# Patient Record
Sex: Male | Born: 1940
Health system: Southern US, Community
[De-identification: ages and names within clinical notes are randomized; demographics above are authoritative.]

## PROBLEM LIST (undated history)

## (undated) DIAGNOSIS — R410 Disorientation, unspecified: Secondary | ICD-10-CM

## (undated) DIAGNOSIS — J45909 Unspecified asthma, uncomplicated: Secondary | ICD-10-CM

## (undated) DIAGNOSIS — J449 Chronic obstructive pulmonary disease, unspecified: Secondary | ICD-10-CM

## (undated) DIAGNOSIS — I2510 Atherosclerotic heart disease of native coronary artery without angina pectoris: Secondary | ICD-10-CM

## (undated) DIAGNOSIS — N400 Enlarged prostate without lower urinary tract symptoms: Secondary | ICD-10-CM

## (undated) DIAGNOSIS — E119 Type 2 diabetes mellitus without complications: Secondary | ICD-10-CM

## (undated) DIAGNOSIS — I499 Cardiac arrhythmia, unspecified: Secondary | ICD-10-CM

## (undated) DIAGNOSIS — G4730 Sleep apnea, unspecified: Secondary | ICD-10-CM

## (undated) DIAGNOSIS — Z79899 Other long term (current) drug therapy: Secondary | ICD-10-CM

## (undated) DIAGNOSIS — K2270 Barrett's esophagus without dysplasia: Secondary | ICD-10-CM

## (undated) DIAGNOSIS — L409 Psoriasis, unspecified: Secondary | ICD-10-CM

## (undated) DIAGNOSIS — E785 Hyperlipidemia, unspecified: Secondary | ICD-10-CM

## (undated) DIAGNOSIS — E039 Hypothyroidism, unspecified: Secondary | ICD-10-CM

## (undated) DIAGNOSIS — I10 Essential (primary) hypertension: Secondary | ICD-10-CM

## (undated) DIAGNOSIS — K219 Gastro-esophageal reflux disease without esophagitis: Secondary | ICD-10-CM

## (undated) DIAGNOSIS — K449 Diaphragmatic hernia without obstruction or gangrene: Secondary | ICD-10-CM

## (undated) DIAGNOSIS — I219 Acute myocardial infarction, unspecified: Secondary | ICD-10-CM

## (undated) HISTORY — DX: Gastro-esophageal reflux disease without esophagitis: K21.9

## (undated) HISTORY — PX: NASAL SINUS SURGERY: SHX719

## (undated) HISTORY — DX: Type 2 diabetes mellitus without complications: E11.9

## (undated) HISTORY — DX: Psoriasis, unspecified: L40.9

## (undated) HISTORY — DX: Atherosclerotic heart disease of native coronary artery without angina pectoris: I25.10

## (undated) HISTORY — DX: Hypothyroidism, unspecified: E03.9

## (undated) HISTORY — DX: Hyperlipidemia, unspecified: E78.5

## (undated) HISTORY — DX: Cardiac arrhythmia, unspecified: I49.9

## (undated) HISTORY — DX: Essential (primary) hypertension: I10

## (undated) HISTORY — DX: Unspecified asthma, uncomplicated: J45.909

## (undated) HISTORY — DX: Other long term (current) drug therapy: Z79.899

## (undated) HISTORY — DX: Chronic obstructive pulmonary disease, unspecified: J44.9

## (undated) HISTORY — DX: Diaphragmatic hernia without obstruction or gangrene: K44.9

## (undated) HISTORY — PX: OTHER SURGICAL HISTORY: SHX169

## (undated) HISTORY — DX: Acute myocardial infarction, unspecified: I21.9

## (undated) HISTORY — DX: Disorientation, unspecified: R41.0

## (undated) HISTORY — DX: Benign prostatic hyperplasia without lower urinary tract symptoms: N40.0

## (undated) HISTORY — PX: TONSILLECTOMY: SUR1361

---
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
## 1992-04-28 HISTORY — PX: OTHER SURGICAL HISTORY: SHX169

## 1997-07-27 ENCOUNTER — Encounter (HOSPITAL_COMMUNITY): Admission: RE | Admit: 1997-07-27 | Discharge: 1997-10-25 | Payer: Self-pay | Admitting: Cardiovascular Disease

## 1997-09-22 ENCOUNTER — Ambulatory Visit (HOSPITAL_COMMUNITY): Admission: RE | Admit: 1997-09-22 | Discharge: 1997-09-22 | Payer: Self-pay | Admitting: Cardiology

## 1997-10-06 ENCOUNTER — Ambulatory Visit (HOSPITAL_COMMUNITY): Admission: RE | Admit: 1997-10-06 | Discharge: 1997-10-06 | Payer: Self-pay | Admitting: Cardiology

## 1997-10-12 ENCOUNTER — Ambulatory Visit (HOSPITAL_COMMUNITY): Admission: RE | Admit: 1997-10-12 | Discharge: 1997-10-13 | Payer: Self-pay | Admitting: Cardiology

## 1997-10-27 ENCOUNTER — Encounter (HOSPITAL_COMMUNITY): Admission: RE | Admit: 1997-10-27 | Discharge: 1998-01-25 | Payer: Self-pay | Admitting: Cardiovascular Disease

## 1998-01-26 ENCOUNTER — Encounter (HOSPITAL_COMMUNITY): Admission: RE | Admit: 1998-01-26 | Discharge: 1998-04-26 | Payer: Self-pay | Admitting: Cardiovascular Disease

## 1998-04-27 ENCOUNTER — Encounter (HOSPITAL_COMMUNITY): Admission: RE | Admit: 1998-04-27 | Discharge: 1998-07-26 | Payer: Self-pay | Admitting: Cardiovascular Disease

## 1998-07-27 ENCOUNTER — Encounter (HOSPITAL_COMMUNITY): Admission: RE | Admit: 1998-07-27 | Discharge: 1998-10-25 | Payer: Self-pay | Admitting: Cardiovascular Disease

## 1998-10-26 ENCOUNTER — Encounter (HOSPITAL_COMMUNITY): Admission: RE | Admit: 1998-10-26 | Discharge: 1999-01-24 | Payer: Self-pay | Admitting: Cardiovascular Disease

## 1999-05-13 ENCOUNTER — Encounter: Payer: Self-pay | Admitting: Family Medicine

## 1999-05-13 ENCOUNTER — Encounter: Admission: RE | Admit: 1999-05-13 | Discharge: 1999-05-13 | Payer: Self-pay | Admitting: Family Medicine

## 2002-06-02 ENCOUNTER — Encounter (INDEPENDENT_AMBULATORY_CARE_PROVIDER_SITE_OTHER): Payer: Self-pay | Admitting: Specialist

## 2002-06-02 ENCOUNTER — Ambulatory Visit (HOSPITAL_COMMUNITY): Admission: RE | Admit: 2002-06-02 | Discharge: 2002-06-02 | Payer: Self-pay | Admitting: Gastroenterology

## 2005-02-07 ENCOUNTER — Encounter: Admission: RE | Admit: 2005-02-07 | Discharge: 2005-02-07 | Payer: Self-pay | Admitting: Emergency Medicine

## 2005-03-15 ENCOUNTER — Encounter: Admission: RE | Admit: 2005-03-15 | Discharge: 2005-03-15 | Payer: Self-pay | Admitting: Emergency Medicine

## 2005-04-01 ENCOUNTER — Encounter: Admission: RE | Admit: 2005-04-01 | Discharge: 2005-04-01 | Payer: Self-pay | Admitting: Emergency Medicine

## 2005-04-15 ENCOUNTER — Encounter: Admission: RE | Admit: 2005-04-15 | Discharge: 2005-04-15 | Payer: Self-pay | Admitting: Emergency Medicine

## 2005-05-02 ENCOUNTER — Encounter: Admission: RE | Admit: 2005-05-02 | Discharge: 2005-05-02 | Payer: Self-pay | Admitting: Emergency Medicine

## 2005-05-20 ENCOUNTER — Ambulatory Visit (HOSPITAL_COMMUNITY): Admission: RE | Admit: 2005-05-20 | Discharge: 2005-05-20 | Payer: Self-pay | Admitting: Emergency Medicine

## 2005-10-21 ENCOUNTER — Ambulatory Visit: Payer: Self-pay | Admitting: Internal Medicine

## 2005-11-14 ENCOUNTER — Encounter: Admission: RE | Admit: 2005-11-14 | Discharge: 2005-11-14 | Payer: Self-pay | Admitting: Cardiology

## 2005-11-26 ENCOUNTER — Ambulatory Visit: Payer: Self-pay | Admitting: Internal Medicine

## 2005-11-28 ENCOUNTER — Ambulatory Visit: Payer: Self-pay | Admitting: Internal Medicine

## 2005-12-02 ENCOUNTER — Ambulatory Visit: Payer: Self-pay | Admitting: Internal Medicine

## 2005-12-05 ENCOUNTER — Ambulatory Visit: Payer: Self-pay | Admitting: Internal Medicine

## 2005-12-22 ENCOUNTER — Ambulatory Visit: Payer: Self-pay | Admitting: Internal Medicine

## 2006-01-12 ENCOUNTER — Ambulatory Visit: Payer: Self-pay | Admitting: Internal Medicine

## 2006-01-22 ENCOUNTER — Ambulatory Visit: Payer: Self-pay | Admitting: Internal Medicine

## 2006-02-02 ENCOUNTER — Ambulatory Visit: Payer: Self-pay | Admitting: Internal Medicine

## 2006-05-11 ENCOUNTER — Ambulatory Visit: Payer: Self-pay | Admitting: Internal Medicine

## 2006-07-07 ENCOUNTER — Ambulatory Visit: Payer: Self-pay | Admitting: Internal Medicine

## 2006-07-14 ENCOUNTER — Ambulatory Visit: Payer: Self-pay | Admitting: Internal Medicine

## 2006-09-10 ENCOUNTER — Ambulatory Visit: Payer: Self-pay | Admitting: Internal Medicine

## 2006-10-02 ENCOUNTER — Encounter: Admission: RE | Admit: 2006-10-02 | Discharge: 2006-10-02 | Payer: Self-pay | Admitting: Emergency Medicine

## 2007-01-04 ENCOUNTER — Ambulatory Visit: Payer: Self-pay | Admitting: Internal Medicine

## 2007-01-07 ENCOUNTER — Ambulatory Visit: Payer: Self-pay | Admitting: Internal Medicine

## 2007-02-26 ENCOUNTER — Ambulatory Visit: Payer: Self-pay | Admitting: Internal Medicine

## 2007-03-08 ENCOUNTER — Ambulatory Visit: Payer: Self-pay | Admitting: Internal Medicine

## 2007-03-15 ENCOUNTER — Ambulatory Visit: Payer: Self-pay | Admitting: Internal Medicine

## 2007-03-22 ENCOUNTER — Ambulatory Visit: Payer: Self-pay | Admitting: Internal Medicine

## 2007-03-30 ENCOUNTER — Ambulatory Visit: Payer: Self-pay | Admitting: Internal Medicine

## 2007-04-06 ENCOUNTER — Ambulatory Visit: Payer: Self-pay | Admitting: Internal Medicine

## 2007-04-06 DIAGNOSIS — J45998 Other asthma: Secondary | ICD-10-CM | POA: Insufficient documentation

## 2007-04-06 DIAGNOSIS — J432 Centrilobular emphysema: Secondary | ICD-10-CM | POA: Insufficient documentation

## 2007-04-14 ENCOUNTER — Ambulatory Visit: Payer: Self-pay | Admitting: Internal Medicine

## 2007-04-26 ENCOUNTER — Ambulatory Visit: Payer: Self-pay | Admitting: Internal Medicine

## 2007-05-11 ENCOUNTER — Ambulatory Visit: Payer: Self-pay | Admitting: Internal Medicine

## 2007-05-19 ENCOUNTER — Ambulatory Visit: Payer: Self-pay | Admitting: Internal Medicine

## 2007-05-25 ENCOUNTER — Ambulatory Visit: Payer: Self-pay | Admitting: Internal Medicine

## 2007-06-03 ENCOUNTER — Ambulatory Visit: Payer: Self-pay | Admitting: Internal Medicine

## 2007-06-15 ENCOUNTER — Ambulatory Visit: Payer: Self-pay | Admitting: Internal Medicine

## 2007-06-22 ENCOUNTER — Ambulatory Visit: Payer: Self-pay | Admitting: Internal Medicine

## 2007-06-29 ENCOUNTER — Ambulatory Visit: Payer: Self-pay | Admitting: Internal Medicine

## 2007-06-30 ENCOUNTER — Ambulatory Visit: Payer: Self-pay | Admitting: Internal Medicine

## 2007-07-06 ENCOUNTER — Ambulatory Visit: Payer: Self-pay | Admitting: Internal Medicine

## 2007-07-13 ENCOUNTER — Ambulatory Visit: Payer: Self-pay | Admitting: Internal Medicine

## 2007-07-20 ENCOUNTER — Ambulatory Visit: Payer: Self-pay | Admitting: Internal Medicine

## 2007-07-27 ENCOUNTER — Ambulatory Visit (HOSPITAL_BASED_OUTPATIENT_CLINIC_OR_DEPARTMENT_OTHER): Admission: RE | Admit: 2007-07-27 | Discharge: 2007-07-27 | Payer: Self-pay | Admitting: Emergency Medicine

## 2007-07-27 ENCOUNTER — Ambulatory Visit: Payer: Self-pay | Admitting: Internal Medicine

## 2007-08-01 ENCOUNTER — Ambulatory Visit: Payer: Self-pay | Admitting: Internal Medicine

## 2007-08-03 ENCOUNTER — Ambulatory Visit: Payer: Self-pay | Admitting: Internal Medicine

## 2007-08-11 ENCOUNTER — Ambulatory Visit: Payer: Self-pay | Admitting: Internal Medicine

## 2007-08-17 ENCOUNTER — Ambulatory Visit: Payer: Self-pay | Admitting: Internal Medicine

## 2007-08-24 ENCOUNTER — Ambulatory Visit: Payer: Self-pay | Admitting: Internal Medicine

## 2007-08-31 ENCOUNTER — Ambulatory Visit: Payer: Self-pay | Admitting: Internal Medicine

## 2007-09-07 ENCOUNTER — Ambulatory Visit: Payer: Self-pay | Admitting: Internal Medicine

## 2007-09-23 ENCOUNTER — Ambulatory Visit: Payer: Self-pay | Admitting: Internal Medicine

## 2007-09-27 ENCOUNTER — Ambulatory Visit: Payer: Self-pay | Admitting: Internal Medicine

## 2007-10-05 ENCOUNTER — Ambulatory Visit: Payer: Self-pay | Admitting: Internal Medicine

## 2007-10-20 ENCOUNTER — Ambulatory Visit: Payer: Self-pay | Admitting: Internal Medicine

## 2007-10-25 ENCOUNTER — Ambulatory Visit: Payer: Self-pay | Admitting: Internal Medicine

## 2007-11-02 ENCOUNTER — Ambulatory Visit: Payer: Self-pay | Admitting: Internal Medicine

## 2007-11-09 ENCOUNTER — Ambulatory Visit: Payer: Self-pay | Admitting: Internal Medicine

## 2007-11-10 ENCOUNTER — Ambulatory Visit: Payer: Self-pay | Admitting: Internal Medicine

## 2007-11-16 ENCOUNTER — Ambulatory Visit: Payer: Self-pay | Admitting: Internal Medicine

## 2007-11-23 ENCOUNTER — Ambulatory Visit: Payer: Self-pay | Admitting: Internal Medicine

## 2007-12-14 ENCOUNTER — Ambulatory Visit: Payer: Self-pay | Admitting: Internal Medicine

## 2007-12-21 ENCOUNTER — Ambulatory Visit: Payer: Self-pay | Admitting: Internal Medicine

## 2007-12-28 ENCOUNTER — Ambulatory Visit: Payer: Self-pay | Admitting: Internal Medicine

## 2008-01-05 ENCOUNTER — Ambulatory Visit: Payer: Self-pay | Admitting: Internal Medicine

## 2008-01-11 ENCOUNTER — Ambulatory Visit: Payer: Self-pay | Admitting: Internal Medicine

## 2008-01-19 ENCOUNTER — Ambulatory Visit: Payer: Self-pay | Admitting: Internal Medicine

## 2008-01-24 ENCOUNTER — Encounter: Payer: Self-pay | Admitting: Internal Medicine

## 2008-01-25 ENCOUNTER — Ambulatory Visit: Payer: Self-pay | Admitting: Pulmonary Disease

## 2008-01-31 ENCOUNTER — Ambulatory Visit: Payer: Self-pay | Admitting: Internal Medicine

## 2008-02-08 ENCOUNTER — Ambulatory Visit: Payer: Self-pay | Admitting: Internal Medicine

## 2008-02-09 ENCOUNTER — Encounter: Payer: Self-pay | Admitting: Internal Medicine

## 2008-02-14 ENCOUNTER — Ambulatory Visit: Payer: Self-pay | Admitting: Internal Medicine

## 2008-02-21 ENCOUNTER — Ambulatory Visit: Payer: Self-pay | Admitting: Internal Medicine

## 2008-02-28 ENCOUNTER — Ambulatory Visit: Payer: Self-pay | Admitting: Internal Medicine

## 2008-03-06 ENCOUNTER — Ambulatory Visit: Payer: Self-pay | Admitting: Internal Medicine

## 2008-03-13 ENCOUNTER — Ambulatory Visit: Payer: Self-pay | Admitting: Internal Medicine

## 2008-03-20 ENCOUNTER — Ambulatory Visit: Payer: Self-pay | Admitting: Internal Medicine

## 2008-03-21 ENCOUNTER — Ambulatory Visit: Payer: Self-pay | Admitting: Internal Medicine

## 2008-04-03 ENCOUNTER — Ambulatory Visit: Payer: Self-pay | Admitting: Internal Medicine

## 2008-04-10 ENCOUNTER — Ambulatory Visit: Payer: Self-pay | Admitting: Internal Medicine

## 2008-04-18 ENCOUNTER — Ambulatory Visit: Payer: Self-pay | Admitting: Internal Medicine

## 2008-04-26 ENCOUNTER — Ambulatory Visit: Payer: Self-pay | Admitting: Internal Medicine

## 2008-05-01 ENCOUNTER — Ambulatory Visit: Payer: Self-pay | Admitting: Internal Medicine

## 2008-05-10 ENCOUNTER — Ambulatory Visit: Payer: Self-pay | Admitting: Internal Medicine

## 2008-05-15 ENCOUNTER — Ambulatory Visit: Payer: Self-pay | Admitting: Internal Medicine

## 2008-05-23 ENCOUNTER — Ambulatory Visit: Payer: Self-pay | Admitting: Internal Medicine

## 2008-05-30 ENCOUNTER — Ambulatory Visit: Payer: Self-pay | Admitting: Internal Medicine

## 2008-06-05 ENCOUNTER — Ambulatory Visit: Payer: Self-pay | Admitting: Internal Medicine

## 2008-06-14 ENCOUNTER — Ambulatory Visit: Payer: Self-pay | Admitting: Internal Medicine

## 2008-06-19 ENCOUNTER — Ambulatory Visit: Payer: Self-pay | Admitting: Internal Medicine

## 2008-06-27 ENCOUNTER — Ambulatory Visit: Payer: Self-pay | Admitting: Internal Medicine

## 2008-07-04 ENCOUNTER — Ambulatory Visit: Payer: Self-pay | Admitting: Internal Medicine

## 2008-07-05 ENCOUNTER — Encounter: Payer: Self-pay | Admitting: Cardiology

## 2008-07-06 ENCOUNTER — Encounter: Payer: Self-pay | Admitting: Cardiology

## 2008-07-10 ENCOUNTER — Ambulatory Visit: Payer: Self-pay | Admitting: Internal Medicine

## 2008-07-10 ENCOUNTER — Encounter: Payer: Self-pay | Admitting: Cardiology

## 2008-07-17 ENCOUNTER — Ambulatory Visit: Payer: Self-pay | Admitting: Internal Medicine

## 2008-07-19 ENCOUNTER — Ambulatory Visit: Payer: Self-pay | Admitting: Internal Medicine

## 2008-07-25 ENCOUNTER — Ambulatory Visit: Payer: Self-pay | Admitting: Internal Medicine

## 2008-08-01 ENCOUNTER — Ambulatory Visit: Payer: Self-pay | Admitting: Internal Medicine

## 2008-08-08 ENCOUNTER — Ambulatory Visit: Payer: Self-pay | Admitting: Internal Medicine

## 2008-08-16 ENCOUNTER — Ambulatory Visit: Payer: Self-pay | Admitting: Internal Medicine

## 2008-08-22 ENCOUNTER — Ambulatory Visit: Payer: Self-pay | Admitting: Internal Medicine

## 2008-08-28 ENCOUNTER — Ambulatory Visit: Payer: Self-pay | Admitting: Internal Medicine

## 2008-09-06 ENCOUNTER — Ambulatory Visit: Payer: Self-pay | Admitting: Internal Medicine

## 2008-09-11 ENCOUNTER — Ambulatory Visit: Payer: Self-pay | Admitting: Internal Medicine

## 2008-09-19 ENCOUNTER — Ambulatory Visit: Payer: Self-pay | Admitting: Internal Medicine

## 2008-09-26 ENCOUNTER — Ambulatory Visit: Payer: Self-pay | Admitting: Internal Medicine

## 2008-10-03 ENCOUNTER — Ambulatory Visit: Payer: Self-pay | Admitting: Internal Medicine

## 2008-10-10 ENCOUNTER — Ambulatory Visit: Payer: Self-pay | Admitting: Internal Medicine

## 2008-10-17 ENCOUNTER — Ambulatory Visit: Payer: Self-pay | Admitting: Internal Medicine

## 2008-10-23 ENCOUNTER — Ambulatory Visit: Payer: Self-pay | Admitting: Internal Medicine

## 2008-10-31 ENCOUNTER — Ambulatory Visit: Payer: Self-pay | Admitting: Internal Medicine

## 2008-11-07 ENCOUNTER — Ambulatory Visit: Payer: Self-pay | Admitting: Internal Medicine

## 2008-11-14 ENCOUNTER — Ambulatory Visit: Payer: Self-pay | Admitting: Internal Medicine

## 2008-11-21 ENCOUNTER — Ambulatory Visit: Payer: Self-pay | Admitting: Internal Medicine

## 2008-11-22 ENCOUNTER — Ambulatory Visit: Payer: Self-pay | Admitting: Internal Medicine

## 2008-11-28 ENCOUNTER — Ambulatory Visit: Payer: Self-pay | Admitting: Internal Medicine

## 2008-12-05 ENCOUNTER — Ambulatory Visit: Payer: Self-pay | Admitting: Internal Medicine

## 2008-12-12 ENCOUNTER — Ambulatory Visit: Payer: Self-pay | Admitting: Internal Medicine

## 2008-12-13 ENCOUNTER — Telehealth: Payer: Self-pay | Admitting: Internal Medicine

## 2008-12-19 ENCOUNTER — Ambulatory Visit: Payer: Self-pay | Admitting: Internal Medicine

## 2008-12-26 ENCOUNTER — Ambulatory Visit: Payer: Self-pay | Admitting: Internal Medicine

## 2009-01-02 ENCOUNTER — Ambulatory Visit: Payer: Self-pay | Admitting: Internal Medicine

## 2009-01-09 ENCOUNTER — Ambulatory Visit: Payer: Self-pay | Admitting: Internal Medicine

## 2009-01-16 ENCOUNTER — Ambulatory Visit: Payer: Self-pay | Admitting: Internal Medicine

## 2009-01-24 ENCOUNTER — Ambulatory Visit: Payer: Self-pay | Admitting: Internal Medicine

## 2009-01-30 ENCOUNTER — Ambulatory Visit: Payer: Self-pay | Admitting: Internal Medicine

## 2009-02-06 ENCOUNTER — Ambulatory Visit: Payer: Self-pay | Admitting: Internal Medicine

## 2009-02-07 ENCOUNTER — Encounter: Payer: Self-pay | Admitting: Cardiology

## 2009-02-14 ENCOUNTER — Ambulatory Visit: Payer: Self-pay | Admitting: Internal Medicine

## 2009-02-20 ENCOUNTER — Ambulatory Visit: Payer: Self-pay | Admitting: Internal Medicine

## 2009-02-21 ENCOUNTER — Telehealth (INDEPENDENT_AMBULATORY_CARE_PROVIDER_SITE_OTHER): Payer: Self-pay | Admitting: *Deleted

## 2009-02-27 ENCOUNTER — Ambulatory Visit: Payer: Self-pay | Admitting: Internal Medicine

## 2009-03-06 ENCOUNTER — Ambulatory Visit: Payer: Self-pay | Admitting: Internal Medicine

## 2009-03-06 DIAGNOSIS — E039 Hypothyroidism, unspecified: Secondary | ICD-10-CM | POA: Insufficient documentation

## 2009-03-07 ENCOUNTER — Ambulatory Visit: Payer: Self-pay | Admitting: Cardiology

## 2009-03-07 DIAGNOSIS — N400 Enlarged prostate without lower urinary tract symptoms: Secondary | ICD-10-CM | POA: Insufficient documentation

## 2009-03-07 DIAGNOSIS — Z9861 Coronary angioplasty status: Secondary | ICD-10-CM

## 2009-03-07 DIAGNOSIS — E785 Hyperlipidemia, unspecified: Secondary | ICD-10-CM | POA: Insufficient documentation

## 2009-03-07 DIAGNOSIS — I2510 Atherosclerotic heart disease of native coronary artery without angina pectoris: Secondary | ICD-10-CM | POA: Insufficient documentation

## 2009-03-13 ENCOUNTER — Ambulatory Visit: Payer: Self-pay | Admitting: Internal Medicine

## 2009-03-20 ENCOUNTER — Ambulatory Visit: Payer: Self-pay | Admitting: Internal Medicine

## 2009-03-26 ENCOUNTER — Ambulatory Visit: Payer: Self-pay | Admitting: Internal Medicine

## 2009-04-02 ENCOUNTER — Ambulatory Visit: Payer: Self-pay | Admitting: Internal Medicine

## 2009-04-03 ENCOUNTER — Ambulatory Visit: Payer: Self-pay | Admitting: Internal Medicine

## 2009-04-10 ENCOUNTER — Ambulatory Visit: Payer: Self-pay | Admitting: Internal Medicine

## 2009-04-17 ENCOUNTER — Ambulatory Visit: Payer: Self-pay | Admitting: Internal Medicine

## 2009-04-24 ENCOUNTER — Ambulatory Visit: Payer: Self-pay | Admitting: Internal Medicine

## 2009-05-01 ENCOUNTER — Ambulatory Visit: Payer: Self-pay | Admitting: Internal Medicine

## 2009-05-09 ENCOUNTER — Ambulatory Visit: Payer: Self-pay | Admitting: Internal Medicine

## 2009-05-15 ENCOUNTER — Ambulatory Visit: Payer: Self-pay | Admitting: Internal Medicine

## 2009-05-23 ENCOUNTER — Ambulatory Visit: Payer: Self-pay | Admitting: Internal Medicine

## 2009-05-29 ENCOUNTER — Telehealth (INDEPENDENT_AMBULATORY_CARE_PROVIDER_SITE_OTHER): Payer: Self-pay | Admitting: *Deleted

## 2009-05-29 ENCOUNTER — Ambulatory Visit: Payer: Self-pay | Admitting: Internal Medicine

## 2009-06-05 ENCOUNTER — Ambulatory Visit: Payer: Self-pay | Admitting: Internal Medicine

## 2009-06-08 ENCOUNTER — Telehealth (INDEPENDENT_AMBULATORY_CARE_PROVIDER_SITE_OTHER): Payer: Self-pay | Admitting: *Deleted

## 2009-06-12 ENCOUNTER — Ambulatory Visit: Payer: Self-pay | Admitting: Internal Medicine

## 2009-06-19 ENCOUNTER — Ambulatory Visit: Payer: Self-pay | Admitting: Internal Medicine

## 2009-06-27 ENCOUNTER — Ambulatory Visit: Payer: Self-pay | Admitting: Internal Medicine

## 2009-07-05 ENCOUNTER — Ambulatory Visit: Payer: Self-pay | Admitting: Internal Medicine

## 2009-07-10 ENCOUNTER — Ambulatory Visit: Payer: Self-pay | Admitting: Internal Medicine

## 2009-07-19 ENCOUNTER — Ambulatory Visit: Payer: Self-pay | Admitting: Internal Medicine

## 2009-07-24 ENCOUNTER — Ambulatory Visit: Payer: Self-pay | Admitting: Internal Medicine

## 2009-07-31 ENCOUNTER — Ambulatory Visit: Payer: Self-pay | Admitting: Internal Medicine

## 2009-08-01 ENCOUNTER — Ambulatory Visit: Payer: Self-pay | Admitting: Internal Medicine

## 2009-08-07 ENCOUNTER — Ambulatory Visit: Payer: Self-pay | Admitting: Internal Medicine

## 2009-08-14 ENCOUNTER — Ambulatory Visit: Payer: Self-pay | Admitting: Internal Medicine

## 2009-08-21 ENCOUNTER — Ambulatory Visit: Payer: Self-pay | Admitting: Internal Medicine

## 2009-08-27 ENCOUNTER — Ambulatory Visit: Payer: Self-pay | Admitting: Internal Medicine

## 2009-08-27 DIAGNOSIS — J309 Allergic rhinitis, unspecified: Secondary | ICD-10-CM | POA: Insufficient documentation

## 2009-09-04 ENCOUNTER — Ambulatory Visit: Payer: Self-pay | Admitting: Internal Medicine

## 2009-09-11 ENCOUNTER — Ambulatory Visit: Payer: Self-pay | Admitting: Internal Medicine

## 2009-09-18 ENCOUNTER — Ambulatory Visit: Payer: Self-pay | Admitting: Internal Medicine

## 2009-10-01 ENCOUNTER — Ambulatory Visit: Payer: Self-pay | Admitting: Internal Medicine

## 2009-10-09 ENCOUNTER — Ambulatory Visit: Payer: Self-pay | Admitting: Internal Medicine

## 2009-10-16 ENCOUNTER — Ambulatory Visit: Payer: Self-pay | Admitting: Internal Medicine

## 2009-10-23 ENCOUNTER — Ambulatory Visit: Payer: Self-pay | Admitting: Internal Medicine

## 2009-10-30 ENCOUNTER — Ambulatory Visit: Payer: Self-pay | Admitting: Internal Medicine

## 2009-11-06 ENCOUNTER — Ambulatory Visit: Payer: Self-pay | Admitting: Internal Medicine

## 2009-11-13 ENCOUNTER — Ambulatory Visit: Payer: Self-pay | Admitting: Internal Medicine

## 2009-11-20 ENCOUNTER — Ambulatory Visit: Payer: Self-pay | Admitting: Internal Medicine

## 2009-11-27 ENCOUNTER — Ambulatory Visit: Payer: Self-pay | Admitting: Internal Medicine

## 2009-12-04 ENCOUNTER — Ambulatory Visit: Payer: Self-pay | Admitting: Internal Medicine

## 2009-12-11 ENCOUNTER — Ambulatory Visit: Payer: Self-pay | Admitting: Internal Medicine

## 2009-12-12 ENCOUNTER — Ambulatory Visit: Payer: Self-pay | Admitting: Internal Medicine

## 2009-12-18 ENCOUNTER — Ambulatory Visit: Payer: Self-pay | Admitting: Internal Medicine

## 2009-12-25 ENCOUNTER — Ambulatory Visit: Payer: Self-pay | Admitting: Internal Medicine

## 2010-01-01 ENCOUNTER — Ambulatory Visit: Payer: Self-pay | Admitting: Internal Medicine

## 2010-01-08 ENCOUNTER — Ambulatory Visit: Payer: Self-pay | Admitting: Internal Medicine

## 2010-01-18 ENCOUNTER — Ambulatory Visit: Payer: Self-pay | Admitting: Internal Medicine

## 2010-01-23 ENCOUNTER — Ambulatory Visit: Payer: Self-pay | Admitting: Internal Medicine

## 2010-01-29 ENCOUNTER — Ambulatory Visit: Payer: Self-pay | Admitting: Internal Medicine

## 2010-02-05 ENCOUNTER — Ambulatory Visit: Payer: Self-pay | Admitting: Internal Medicine

## 2010-02-12 ENCOUNTER — Ambulatory Visit: Payer: Self-pay | Admitting: Internal Medicine

## 2010-02-19 ENCOUNTER — Ambulatory Visit: Payer: Self-pay | Admitting: Internal Medicine

## 2010-02-21 ENCOUNTER — Ambulatory Visit: Payer: Self-pay | Admitting: Internal Medicine

## 2010-02-26 ENCOUNTER — Ambulatory Visit: Payer: Self-pay | Admitting: Internal Medicine

## 2010-03-04 ENCOUNTER — Ambulatory Visit: Payer: Self-pay | Admitting: Cardiology

## 2010-03-04 DIAGNOSIS — R0989 Other specified symptoms and signs involving the circulatory and respiratory systems: Secondary | ICD-10-CM | POA: Insufficient documentation

## 2010-03-05 ENCOUNTER — Ambulatory Visit: Payer: Self-pay | Admitting: Internal Medicine

## 2010-03-12 ENCOUNTER — Ambulatory Visit: Payer: Self-pay | Admitting: Internal Medicine

## 2010-03-13 ENCOUNTER — Encounter: Admission: RE | Admit: 2010-03-13 | Discharge: 2010-03-13 | Payer: Self-pay | Admitting: Emergency Medicine

## 2010-03-14 ENCOUNTER — Ambulatory Visit: Payer: Self-pay | Admitting: Cardiology

## 2010-03-14 ENCOUNTER — Ambulatory Visit: Payer: Self-pay

## 2010-03-14 ENCOUNTER — Encounter (INDEPENDENT_AMBULATORY_CARE_PROVIDER_SITE_OTHER): Payer: Self-pay | Admitting: *Deleted

## 2010-03-19 ENCOUNTER — Ambulatory Visit: Payer: Self-pay | Admitting: Internal Medicine

## 2010-03-26 ENCOUNTER — Ambulatory Visit: Payer: Self-pay | Admitting: Internal Medicine

## 2010-04-02 ENCOUNTER — Ambulatory Visit: Payer: Self-pay | Admitting: Internal Medicine

## 2010-04-09 ENCOUNTER — Ambulatory Visit: Payer: Self-pay | Admitting: Internal Medicine

## 2010-04-16 ENCOUNTER — Ambulatory Visit: Payer: Self-pay | Admitting: Internal Medicine

## 2010-04-23 ENCOUNTER — Ambulatory Visit: Payer: Self-pay | Admitting: Internal Medicine

## 2010-04-26 ENCOUNTER — Ambulatory Visit: Payer: Self-pay | Admitting: Internal Medicine

## 2010-05-01 ENCOUNTER — Telehealth: Payer: Self-pay | Admitting: Cardiology

## 2010-05-02 ENCOUNTER — Encounter (INDEPENDENT_AMBULATORY_CARE_PROVIDER_SITE_OTHER): Payer: Self-pay | Admitting: *Deleted

## 2010-05-11 ENCOUNTER — Ambulatory Visit: Payer: Self-pay | Admitting: Internal Medicine

## 2010-05-16 ENCOUNTER — Ambulatory Visit: Payer: Self-pay | Admitting: Internal Medicine

## 2010-05-17 ENCOUNTER — Ambulatory Visit: Payer: Self-pay | Admitting: Internal Medicine

## 2010-05-26 ENCOUNTER — Ambulatory Visit: Payer: Self-pay | Admitting: Internal Medicine

## 2010-05-26 LAB — CONVERTED CEMR LAB
ALT: 33 units/L (ref 0–53)
AST: 28 units/L (ref 0–37)
BUN: 14 mg/dL (ref 6–23)
Calcium: 9 mg/dL (ref 8.4–10.5)
Cholesterol: 144 mg/dL (ref 0–200)
GFR calc non Af Amer: 90.72 mL/min (ref >60)
Glucose, Bld: 99 mg/dL (ref 70–99)
HDL: 41.5 mg/dL (ref >39.00)
Potassium: 4.1 meq/L (ref 3.5–5.1)
Sodium: 141 meq/L (ref 135–145)
Total Protein: 6.3 g/dL (ref 6.0–8.3)
VLDL: 24.2 mg/dL (ref 0.0–40.0)

## 2010-05-28 ENCOUNTER — Ambulatory Visit: Payer: Self-pay | Admitting: Internal Medicine

## 2010-05-30 NOTE — Miscellaneous (Signed)
Summary: Injection Record / [facility]    Injection Record / [facility]    Imported By: Lennie Odor 12/28/2009 10:48:32  _____________________________________________________________________  External Attachment:    Type:   Image     Comment:   External Document

## 2010-05-30 NOTE — Letter (Signed)
Summary: Clearance Letter  [facility], Main Office  [address]   [place], [place] [address]   Phone: [phone]  Fax: [phone]    May 02, 2010  Re:     Octavio Manns Address:   [address] [place], [place]  [address] DOB:     07-06-1940 MRN:     [id number]   Dear Dr Madelon Lips,      Mr Mangano is low risk cardiac wise for knee surgery. Please call with any questions or concerns.    Sincerely,  Deliah Goody, RN/Dr Olga Millers

## 2010-05-30 NOTE — Miscellaneous (Signed)
Summary: Orders Update  Clinical Lists Changes  Orders: Added new Test order of TLB-BMP (Basic Metabolic Panel-BMET) (80048-METABOL) - Signed 

## 2010-05-30 NOTE — Miscellaneous (Signed)
Summary: Injection Record/[facility]  Injection Record/[facility]   Imported By: Sherian Rein 09/18/2009 08:59:22  _____________________________________________________________________  External Attachment:    Type:   Image     Comment:   External Document

## 2010-05-30 NOTE — Progress Notes (Signed)
Summary: Records request from [facility]  Request for records received from [facility]. Request forwarded to [facility]. Dena Chavis  May 29, 2009 3:42 PM

## 2010-05-30 NOTE — Progress Notes (Signed)
Summary: from office notes in nov- surgery  Phone Note Call from Patient Call back at Home Phone [phone]   Caller: Patient- [phone] Reason for Call: Talk to Nurse Summary of Call: from office notes in nov can pt be clear for surgery - knee.  Initial call taken by: Lorne Skeens,  May 01, 2010 2:07 PM  Follow-up for Phone Call        spoke with pt, he is needing surgery for a knee replacement with dr Madelon Lips. pt is having no problems. will foward for dr Jens Som review Deliah Goody, RN  May 01, 2010 2:24 PM   Additional Follow-up for Phone Call Additional follow up Details #1::        OK for surgery Ferman Hamming, MD, [facility]  May 02, 2010 11:21 AM  pt aware, note will be faxed to dr caffrey Deliah Goody, RN  May 02, 2010 3:14 PM

## 2010-05-30 NOTE — Miscellaneous (Signed)
Summary: Injection record  Injection record   Imported By: Lester Climax 05/10/2010 12:19:15  _____________________________________________________________________  External Attachment:    Type:   Image     Comment:   External Document

## 2010-05-30 NOTE — Miscellaneous (Signed)
Summary: Injection Record/[facility]  Injection Record/ [facility]   Imported By: Lanelle Bal 08/29/2009 14:07:40  _____________________________________________________________________  External Attachment:    Type:   Image     Comment:   External Document

## 2010-05-30 NOTE — Assessment & Plan Note (Signed)
Summary: chest congestion worse/ mbw   Primary Provider/Referring Provider:  Leslee Home  CC:  Acute visit-Chest congestion Increased.  History of Present Illness: 02/28/08- One day before next allergy vac inj is due, he begins getting more congested- Clears a few hours after each injection. Very pleased. Denies cough, wheeze, dyspnea. 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 We discussed his echocardiogram..   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 Tried Spiriva from Dr Lorenz Coaster after office spirometry came back low.  Aug 27, 2009 Allergic asthma, COPD Followed by [facility] cards for CAD. He feels he has done very well this Spring with what he considers minor sneeze and occasional wheeze. Takes daily fexofenadine. He has a skin lesion on his forehead he asked me to look at- suggested he show it to Dr Lorenz Coaster.   February 21, 2010-  He notes nasal congestion, postnasal drip and a little wheeze.  He believes this all changed when he couldn't get Allegra-D, so we talked abut that. He says rescue inhalers dry up his mouth so he has trouble with his teeth. Did have flu shot. Up to date on pneumovax.    Asthma History    Initial Asthma Severity Rating:    Age range: 12+ years    Symptoms: 0-2 days/week    Nighttime Awakenings: 0-2/month    Interferes w/ normal activity: no limitations    SABA use (not for EIB): 0-2 days/week    Asthma Severity Assessment: Intermittent   Preventive Screening-Counseling & Management  Alcohol-Tobacco     Smoking Status: quit     Year Quit: 1987  Current Medications (verified): 1)  Synthroid 175 Mcg Tabs (Levothyroxine Sodium) .... Take 1 By Mouth Once Daily 2)  Lipitor 40 Mg Tabs (Atorvastatin Calcium) .... Take 1 By Mouth Once Daily 3)  Viagra 100 Mg  Tabs (Sildenafil Citrate) .... As Needed 4)  Adult Aspirin Ec Low Strength 81 Mg  Tbec (Aspirin) .Marland Kitchen.. 1  Tab Monday, Wendnesday, Fri 5)  Omeprazole 20 Mg Tbec (Omeprazole) .Marland Kitchen.. 1 Tab By Mouth Once Daily 6)  Finasteride 5 Mg Tabs (Finasteride) .... Take 1 By Mouth At Bedtime 7)  Nitrolingual 0.4 Mg/spray Soln (Nitroglycerin) .... Use As Directed As Needed 8)  Senior Multivitamin Plus  Tabs (Multiple Vitamins-Minerals) .... Take 1 Tablet By Mouth Once A Day 9)  Glucosamine-Chondroitin 500-400 Mg Caps (Glucosamine-Chondroitin) .... 3 Tablets By Mouth At Breakfast 10)  Aleve 220 Mg Tabs (Naproxen Sodium) .... Take 2 By Mouth Once Daily As Needed 11)  Allergy Vaccine 1:10 G.h. .... Weekly 12)  Vitamin D3 2000 Unit Caps (Cholecalciferol) .Marland Kitchen.. 1 Tab By Mouth Once Daily 13)  Betamethasone Dipropionate 0.05 % Oint (Betamethasone Dipropionate) .... As Needed 14)  Restasis 0.05 % Emul (Cyclosporine) .Marland Kitchen.. 1 Drop in Each Eye Two Times A Day 15)  Cranberry 250 Mg Caps (Cranberry) .... Once Daily 16)  Calcium + D + K 750-500-40 Mg-Unt-Mcg Tabs (Calcium-Vitamin D-Vitamin K) .... Take 1 By Mouth Once Daily 17)  Garlic Oil 1000 Mg Caps (Garlic) .... Take 1 By Mouth Once Daily  Allergies (verified): No Known Drug Allergies  Review of Systems      See HPI       The patient complains of nasal congestion/difficulty breathing through nose.  The patient denies shortness of breath with activity, shortness of breath at rest, productive cough, non-productive cough, coughing up blood, chest pain, irregular  heartbeats, acid heartburn, indigestion, loss of appetite, weight change, abdominal pain, difficulty swallowing, sore throat, tooth/dental problems, headaches, and sneezing.    Vital Signs:  Patient profile:   70 year old male Height:      71 inches Weight:      262.13 pounds BMI:     36.69 O2 Sat:      95 % on Room air Pulse rate:   81 / minute BP sitting:   116 / 80  (left arm) Cuff size:   regular  Vitals Entered By: Reynaldo Minium CMA (February 21, 2010 11:32 AM)  O2 Flow:  Room air CC: Acute visit-Chest  congestion Increased   Physical Exam  Additional Exam:  General: A/Ox3; pleasant and cooperative, NAD, overweight, calm and talkative. SKIN: no rash,  small raised excoriated lesion left temple NODES: no lymphadenopathy HEENT: [place]/AT, EOM- WNL, Conjuctivae- clear, PERRLA, TM-WNL, Nose- clear, Throat- clear and wnl,, Mallampati  III NECK: Supple w/ fair ROM, JVD- none, normal carotid impulses w/o bruits Thyroid- CHEST: Clear to P&A HEART: RRR, no m/g/r heard ABDOMEN AOZ:HYQM, nl pulses, no edema  NEURO: Grossly intact to observation      Impression & Recommendations:  Problem # 1:  ALLERGIC ASTHMA (ICD-493.00) Good control. He is pleased. Rarely needs his rescue inhaler and we discussed his impression that these products affect his teeth.   Problem # 2:  ALLERGIC RHINITIS (ICD-477.9)  He believes his allergy trreatments have reduced his incidence of bronchitic flares. Rhinitis control has been good. We reviewed goals of allergy vaccine therapy.   Medications Added to Medication List This Visit: 1)  Synthroid 175 Mcg Tabs (Levothyroxine sodium) .... Take 1 by mouth once daily 2)  Restasis 0.05 % Emul (Cyclosporine) .Marland Kitchen.. 1 drop in each eye two times a day 3)  Cranberry 250 Mg Caps (Cranberry) .... Once daily 4)  Calcium + D + K 750-500-40 Mg-unt-mcg Tabs (Calcium-vitamin d-vitamin k) .... Take 1 by mouth once daily 5)  Garlic Oil 1000 Mg Caps (Garlic) .... Take 1 by mouth once daily  Other Orders: Est. Patient Level III (57846)  Patient Instructions: 1)  Please schedule a follow-up appointment in 1 year. 2)  Ask at pharmacy counter for either  3)  Allegra-D24 4)  Allegra- D12hr 5)  or, otc - try plain Allegtra/ fexofenadine 6)               plus a decongestant like Sudafed (sign for at counter)  7)                     or phenylephrine (Sudafed-PE and others)

## 2010-05-30 NOTE — Assessment & Plan Note (Signed)
Summary: 1 year/ mbw   Primary Provider/Referring Provider:  Leslee Home  CC:  Yearly follow up visit.  History of Present Illness: IMPRESSION:  Recurrent bronchitis with a significant atopic component. Pulmonary function tests in 2007 had been normal.  PLAN:  Continue allergy vaccine.  Okay to try taking one-half of a 180- mg fexofenadine p.r.n.  Scheduled to return in 1 year, but earlier p.r.n  02/28/08- One day before next allergy vac inj is due, he begins getting more congested- Clears a few hours after each injection. Very pleased. Denies cough, wheeze, dyspnea. 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 We discussed his echocardiogram..   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 Tried Spiriva from Dr Lorenz Coaster after office spirometry came back low.  Aug 27, 2009 Allergic asthma, COPD Followed by [facility] cards for CAD. He feels he has done very well this Spring with what he considers minor sneeze and occasional wheeze. Takes daily fexofenadine. He has a skin lesion on his forehead he asked me to look at- suggested he show it to Dr Lorenz Coaster.    Current Medications (verified): 1)  Synthroid 150 Mcg  Tabs (Levothyroxine Sodium) .... Take 1 Tablet By Mouth Once A Day 2)  Lipitor 40 Mg Tabs (Atorvastatin Calcium) .... Take 1 By Mouth Once Daily 3)  Viagra 100 Mg  Tabs (Sildenafil Citrate) .... As Needed 4)  Adult Aspirin Ec Low Strength 81 Mg  Tbec (Aspirin) .Marland Kitchen.. 1 Tab Monday, Wendnesday, Fri 5)  Omeprazole 20 Mg Tbec (Omeprazole) .Marland Kitchen.. 1 Tab By Mouth Once Daily 6)  Finasteride 5 Mg Tabs (Finasteride) .... Take 1 By Mouth At Bedtime 7)  Nitrolingual 0.4 Mg/spray Soln (Nitroglycerin) .... Use As Directed As Needed 8)  Senior Multivitamin Plus  Tabs (Multiple Vitamins-Minerals) .... Take 1 Tablet By Mouth Once A Day 9)  Glucosamine-Chondroitin 500-400 Mg Caps (Glucosamine-Chondroitin) .... 3  Tablets By Mouth At Breakfast 10)  Aleve 220 Mg Tabs (Naproxen Sodium) .... Take 2 By Mouth Once Daily As Needed 11)  Allergy Vaccine 1:10 G.h. .... Weekly 12)  Vitamin D3 2000 Unit Caps (Cholecalciferol) .Marland Kitchen.. 1 Tab By Mouth Once Daily 13)  Betamethasone Dipropionate 0.05 % Oint (Betamethasone Dipropionate) .... 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    Vital Signs:  Patient profile:   70 year old male Height:      71 inches Weight:      253.13 pounds BMI:     35.43 O2 Sat:      96 % on Room air Pulse rate:  83 / minute BP sitting:   120 / 78  (left arm) Cuff size:   regular  Vitals Entered By: Reynaldo Minium CMA (Aug 27, 2009 9:10 AM)  O2 Flow:  Room air  Physical Exam  Additional Exam:  General: A/Ox3; pleasant and cooperative, NAD, overweight, calm and talkative. SKIN: no rash,  small raised excoriated lesion left temple NODES: no lymphadenopathy HEENT: [place]/AT, EOM- WNL,  Conjuctivae- clear, PERRLA, TM-WNL, Nose- clear, Throat- clear and wnl,, Mallampati  III NECK: Supple w/ fair ROM, JVD- none, normal carotid impulses w/o bruits Thyroid- CHEST: Clear to P&A HEART: RRR, no m/g/r heard ABDOMEN XBJ:YNWG, nl pulses, no edema  NEURO: Grossly intact to observation      Impression & Recommendations:  Problem # 1:  ALLERGIC ASTHMA (ICD-493.00) Good control with no changes needed. He feels allergy vaccine has made a real difference.  Problem # 2:  ALLERGIC RHINITIS (ICD-477.9)  He is well pleased with the success of his allergy vaccine and the benign support of fexofenadine which we discussed.  Other Orders: Est. Patient Level III (95621)  Patient Instructions: 1)  Please schedule a follow-up appointment in 1 year. 2)  Call for refills and as needed

## 2010-05-30 NOTE — Letter (Signed)
Summary: Custom - Lipid  [facility], Main Office  [address]   [place], [place] [address]   Phone: [phone]  Fax: [phone]     March 14, 2010 MRN: [id number]   [facility] [address] [place], [place]  [address]   Dear Mr. Gahm,  We have reviewed your cholesterol results.  They are as follows:     Total Cholesterol:    144 (Desirable: less than 200)       HDL  Cholesterol:     41.50  (Desirable: greater than 40 for men and 50 for women)       LDL Cholesterol:       78  (Desirable: less than 100 for low risk and less than 70 for moderate to high risk)       Triglycerides:       121.0  (Desirable: less than 150)  Our recommendations include:These numbers look good. Continue on the same medicine. Liver function is normal. Take care, Dr. Darel Hong.    Call our office at the number listed above if you have any questions.  Lowering your LDL cholesterol is important, but it is only one of a large number of "risk factors" that may indicate that you are at risk for heart disease, stroke or other complications of hardening of the arteries.  Other risk factors include:   A.  Cigarette Smoking* B.  High Blood Pressure* C.  Obesity* D.   Low HDL Cholesterol (see yours above)* E.   Diabetes Mellitus (higher risk if your is uncontrolled) F.  Family history of premature heart disease G.  Previous history of stroke or cardiovascular disease    *These are risk factors YOU HAVE CONTROL OVER.  For more information, visit .  There is now evidence that lowering the TOTAL CHOLESTEROL AND LDL CHOLESTEROL can reduce the risk of heart disease.  The American Heart Association recommends the following guidelines for the treatment of elevated cholesterol:  1.  If there is now current heart disease and less than two risk factors, TOTAL CHOLESTEROL should be less than 200 and LDL CHOLESTEROL should be less than 100. 2.  If there is current heart disease or  two or more risk factors, TOTAL CHOLESTEROL should be less than 200 and LDL CHOLESTEROL should be less than 70.  A diet low in cholesterol, saturated fat, and calories is the cornerstone of treatment for elevated cholesterol.  Cessation of smoking and exercise are also important in the management of elevated cholesterol and preventing vascular disease.  Studies have shown that 30 to 60 minutes of physical activity most days can help lower blood pressure, lower cholesterol, and keep your weight at a healthy level.  Drug therapy is used when cholesterol levels do not respond to therapeutic lifestyle changes (smoking cessation, diet, and exercise) and remains unacceptably high.  If medication is started, it is important to have you levels checked periodically to evaluate the need for further treatment options.  Thank you,    [facility] Team

## 2010-05-30 NOTE — Assessment & Plan Note (Signed)
Summary: [name]/DM   Primary Provider:  Leslee Home   History of Present Illness: Pleasant gentleman with history of coronary disease for F/U. Patient has had previous PCI of his LAD. His last Myoview was performed in October of 2009 and showed an ejection fraction of 44%. There was a fixed anterior defect consistent with scar but no ischemia. He also had an echocardiogram at that time that showed an ejection fraction of 50-55% and trace aortic insufficiency. I last saw him in Nov 2010. Since then he does have some dyspnea from allergies. However there is no orthopnea, PND, palpitations, syncope or chest pain. There is mild pedal edema.  Current Medications (verified): 1)  Synthroid 175 Mcg Tabs (Levothyroxine Sodium) .... Take 1 By Mouth Once Daily 2)  Lipitor 40 Mg Tabs (Atorvastatin Calcium) .... Take 1 By Mouth Once Daily 3)  Viagra 100 Mg  Tabs (Sildenafil Citrate) .... As Needed 4)  Adult Aspirin Ec Low Strength 81 Mg  Tbec (Aspirin) .Marland Kitchen.. 1 Tab Monday, Wendnesday, Fri 5)  Omeprazole 20 Mg Tbec (Omeprazole) .Marland Kitchen.. 1 Tab By Mouth Once Daily 6)  Finasteride 5 Mg Tabs (Finasteride) .... Take 1 By Mouth At Bedtime 7)  Nitrolingual 0.4 Mg/spray Soln (Nitroglycerin) .... Use As Directed As Needed 8)  Senior Multivitamin Plus  Tabs (Multiple Vitamins-Minerals) .... Take 1 Tablet By Mouth Once A Day 9)  Glucosamine-Chondroitin 500-400 Mg Caps (Glucosamine-Chondroitin) .... 3 Tablets By Mouth At Breakfast 10)  Aleve 220 Mg Tabs (Naproxen Sodium) .... Take 2 By Mouth Once Daily As Needed 11)  Allergy Vaccine 1:10 G.h. .... Weekly 12)  Vitamin D3 2000 Unit Caps (Cholecalciferol) .Marland Kitchen.. 1 Tab By Mouth Once Daily 13)  Betamethasone Dipropionate 0.05 % Oint (Betamethasone Dipropionate) .... As Needed 14)  Restasis 0.05 % Emul (Cyclosporine) .Marland Kitchen.. 1 Drop in Each Eye Two Times A Day 15)  Fexofenadine Hcl 180 Mg Tabs (Fexofenadine Hcl) .Marland Kitchen.. 1 Tab By Mouth Once Daily 16)  Nasonex 50 Mcg/act Susp (Mometasone  Furoate) .... As Directed  Allergies: No Known Drug Allergies  Past History:  Past Medical History: Hyperlipidemia Hiatal hernia GERD HYPOTHYROIDISM (ICD-244.9) ALLERGIC ASTHMA (ICD-493.00) COPD (ICD-496) coronary artery disease (1987 - [place] with PCI of LAD; 1997 - [place] with repeat PCI of LAD) BPH psoriasis  Past Surgical History: Reviewed history from 03/07/2009 and no changes required. Left knee arthroscopy tonsillectomy sinus surgery  Social History: Reviewed history from 03/07/2009 and no changes required. 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 Remaining systems are negative.   Vital Signs:  Patient profile:   70 year old male Height:      71 inches Weight:      262 pounds BMI:     36.67 Pulse rate:   96 / minute Resp:     12 per minute BP sitting:   129 / 81  (left arm)  Vitals Entered By: Kem Parkinson (March 04, 2010 9:21 AM)  Physical Exam  General:  Well-developed well-nourished in no acute distress.  Skin is warm and dry.  HEENT is normal.  Neck is supple. No thyromegaly.  Chest is clear to auscultation with normal expansion.  Cardiovascular exam is regular rate and rhythm.  Abdominal exam nontender or distended. No masses palpated. positive bruit Extremities show 1+ edema. neuro grossly  intact    EKG  Procedure date:  03/04/2010  Findings:      Sinus at a rate of 96. Axis normal. Incomplete right bundle branch block. Prior septal infarct.  Impression & Recommendations:  Problem # 1:  PURE HYPERCHOLESTEROLEMIA (ICD-272.0) Continue statin. Check lipids and liver. His updated medication list for this problem includes:    Lipitor 40 Mg Tabs (Atorvastatin calcium) .Marland Kitchen... Take 1 by mouth once daily  His  updated medication list for this problem includes:    Lipitor 40 Mg Tabs (Atorvastatin calcium) .Marland Kitchen... Take 1 by mouth once daily  Problem # 2:  CAD (ICD-414.00) Continue aspirin and statin. Plan Myoview when he returns in one year. His updated medication list for this problem includes:    Adult Aspirin Ec Low Strength 81 Mg Tbec (Aspirin) .Marland Kitchen... 1 tab monday, wendnesday, fri    Nitrolingual 0.4 Mg/spray Soln (Nitroglycerin) ..... Use as directed as needed  Problem # 3:  ABDOMINAL BRUIT (ICD-785.9) Schedule abdominal ultrasound to exclude aneurysm. Orders: Abdominal Aorta Duplex (Abd Aorta Duplex)  Problem # 4:  HYPOTHYROIDISM (ICD-244.9)  His updated medication list for this problem includes:    Synthroid 175 Mcg Tabs (Levothyroxine sodium) .Marland Kitchen... Take 1 by mouth once daily  Problem # 5:  COPD (ICD-496)  Patient Instructions: 1)  Your physician recommends that you schedule a follow-up appointment in: ONE YEAR 2)  Your physician recommends that you return for lab work ZO:XWRU DOPPLER 3)  Your physician has requested that you have an abdominal aorta duplex. During this test, an ultrasound is used to evaluate the aorta. Allow 30 minutes for this exam. Do not eat after midnight the day before and avoid carbonated beverages. There are no restrictions or special instructions.

## 2010-05-30 NOTE — Miscellaneous (Signed)
Summary: Injection Record/[facility]  Injection Record/[facility]   Imported By: Sherian Rein 09/18/2009 13:49:54  _____________________________________________________________________  External Attachment:    Type:   Image     Comment:   External Document

## 2010-05-30 NOTE — Progress Notes (Signed)
  Request for Records from [facility] forwarded to [facility] for processing [facility]  June 08, 2009 10:43 AM

## 2010-05-30 NOTE — Assessment & Plan Note (Signed)
Summary: FLU SHOT/[facility]  Nurse Visit   Allergies: No Known Drug Allergies  Orders Added: 1)  Flu Vaccine 76yrs + MEDICARE PATIENTS [Q2039] 2)  Administration Flu vaccine - MCR [G0008] Flu Vaccine Consent Questions     Do you have a history of severe allergic reactions to this vaccine? no    Any prior history of allergic reactions to egg and/or gelatin? no    Do you have a sensitivity to the preservative Thimersol? no    Do you have a past history of Guillan-Barre Syndrome? no    Do you currently have an acute febrile illness? no    Have you ever had a severe reaction to latex? no    Vaccine information given and explained to patient? yes    Are you currently pregnant? no    Lot Number:[id number]   Exp Date:10/26/2010   Site Given  Left Deltoid IM  Clarise Cruz [facility])  January 29, 2010 11:16 AM

## 2010-06-04 DIAGNOSIS — J301 Allergic rhinitis due to pollen: Secondary | ICD-10-CM

## 2010-06-11 ENCOUNTER — Ambulatory Visit (INDEPENDENT_AMBULATORY_CARE_PROVIDER_SITE_OTHER): Payer: Medicare Other

## 2010-06-11 DIAGNOSIS — J301 Allergic rhinitis due to pollen: Secondary | ICD-10-CM

## 2010-06-14 ENCOUNTER — Encounter (HOSPITAL_COMMUNITY)
Admission: RE | Admit: 2010-06-14 | Discharge: 2010-06-14 | Disposition: A | Discharge status: Home / Self Care | Payer: Medicare Other | Source: Ambulatory Visit | Attending: Orthopedic Surgery | Admitting: Orthopedic Surgery

## 2010-06-14 ENCOUNTER — Other Ambulatory Visit (HOSPITAL_COMMUNITY): Payer: Self-pay | Admitting: Orthopedic Surgery

## 2010-06-14 ENCOUNTER — Ambulatory Visit (HOSPITAL_COMMUNITY)
Admission: RE | Admit: 2010-06-14 | Discharge: 2010-06-14 | Disposition: A | Discharge status: Home / Self Care | Payer: Medicare Other | Source: Ambulatory Visit | Attending: Orthopedic Surgery | Admitting: Orthopedic Surgery

## 2010-06-14 DIAGNOSIS — Z01818 Encounter for other preprocedural examination: Secondary | ICD-10-CM | POA: Insufficient documentation

## 2010-06-14 DIAGNOSIS — IMO0002 Reserved for concepts with insufficient information to code with codable children: Secondary | ICD-10-CM | POA: Insufficient documentation

## 2010-06-14 DIAGNOSIS — M179 Osteoarthritis of knee, unspecified: Secondary | ICD-10-CM

## 2010-06-14 DIAGNOSIS — J449 Chronic obstructive pulmonary disease, unspecified: Secondary | ICD-10-CM | POA: Insufficient documentation

## 2010-06-14 DIAGNOSIS — I10 Essential (primary) hypertension: Secondary | ICD-10-CM | POA: Insufficient documentation

## 2010-06-14 DIAGNOSIS — J4489 Other specified chronic obstructive pulmonary disease: Secondary | ICD-10-CM | POA: Insufficient documentation

## 2010-06-14 DIAGNOSIS — M1710 Unilateral primary osteoarthritis, unspecified knee: Secondary | ICD-10-CM | POA: Insufficient documentation

## 2010-06-14 LAB — URINALYSIS, ROUTINE W REFLEX MICROSCOPIC
Ketones, ur: 15 mg/dL — AB
Nitrite: NEGATIVE
Protein, ur: NEGATIVE mg/dL
Urobilinogen, UA: 1 mg/dL (ref 0.0–1.0)

## 2010-06-14 LAB — COMPREHENSIVE METABOLIC PANEL
AST: 26 U/L (ref 0–37)
Albumin: 4.1 g/dL (ref 3.5–5.2)
BUN: 10 mg/dL (ref 6–23)
Calcium: 9.3 mg/dL (ref 8.4–10.5)
Chloride: 109 mEq/L (ref 96–112)
Creatinine, Ser: 0.82 mg/dL (ref 0.4–1.5)
GFR calc Af Amer: >60 mL/min (ref >60)
Total Bilirubin: 0.6 mg/dL (ref 0.3–1.2)
Total Protein: 6.3 g/dL (ref 6.0–8.3)

## 2010-06-14 LAB — CBC
MCV: 95.3 fL (ref 78.0–100.0)
Platelets: 139 10*3/uL — ABNORMAL LOW (ref 150–400)
RBC: 4.64 MIL/uL (ref 4.22–5.81)
RDW: 13.2 % (ref 11.5–15.5)
WBC: 6.7 10*3/uL (ref 4.0–10.5)

## 2010-06-14 LAB — DIFFERENTIAL
Basophils Absolute: 0.1 10*3/uL (ref 0.0–0.1)
Eosinophils Absolute: 0.3 10*3/uL (ref 0.0–0.7)
Eosinophils Relative: 4 % (ref 0–5)
Lymphs Abs: 1.4 10*3/uL (ref 0.7–4.0)
Neutrophils Relative %: 64 % (ref 43–77)

## 2010-06-14 LAB — PROTIME-INR
INR: 0.96 (ref 0.00–1.49)
Prothrombin Time: 13 seconds (ref 11.6–15.2)

## 2010-06-14 LAB — SURGICAL PCR SCREEN: Staphylococcus aureus: NEGATIVE

## 2010-06-14 LAB — TYPE AND SCREEN: Antibody Screen: NEGATIVE

## 2010-06-14 LAB — ABO/RH: ABO/RH(D): O NEG

## 2010-06-15 LAB — URINE CULTURE: Colony Count: NO GROWTH

## 2010-06-19 ENCOUNTER — Ambulatory Visit (INDEPENDENT_AMBULATORY_CARE_PROVIDER_SITE_OTHER): Payer: Medicare Other

## 2010-06-19 DIAGNOSIS — J301 Allergic rhinitis due to pollen: Secondary | ICD-10-CM

## 2010-06-21 ENCOUNTER — Inpatient Hospital Stay (HOSPITAL_COMMUNITY)
Admission: RE | Admit: 2010-06-21 | Discharge: 2010-06-28 | DRG: 470 | Disposition: A | Discharge status: Skilled Nursing Facility | Payer: Medicare Other | Source: Ambulatory Visit | Attending: Orthopedic Surgery | Admitting: Orthopedic Surgery

## 2010-06-21 ENCOUNTER — Inpatient Hospital Stay (HOSPITAL_COMMUNITY): Payer: Medicare Other

## 2010-06-21 DIAGNOSIS — Z9861 Coronary angioplasty status: Secondary | ICD-10-CM

## 2010-06-21 DIAGNOSIS — L03119 Cellulitis of unspecified part of limb: Secondary | ICD-10-CM | POA: Diagnosis not present

## 2010-06-21 DIAGNOSIS — I10 Essential (primary) hypertension: Secondary | ICD-10-CM | POA: Diagnosis present

## 2010-06-21 DIAGNOSIS — Y9210 Unspecified residential institution as the place of occurrence of the external cause: Secondary | ICD-10-CM | POA: Diagnosis not present

## 2010-06-21 DIAGNOSIS — T8140XA Infection following a procedure, unspecified, initial encounter: Secondary | ICD-10-CM | POA: Diagnosis not present

## 2010-06-21 DIAGNOSIS — E039 Hypothyroidism, unspecified: Secondary | ICD-10-CM | POA: Diagnosis present

## 2010-06-21 DIAGNOSIS — D62 Acute posthemorrhagic anemia: Secondary | ICD-10-CM | POA: Diagnosis not present

## 2010-06-21 DIAGNOSIS — K5900 Constipation, unspecified: Secondary | ICD-10-CM | POA: Diagnosis present

## 2010-06-21 DIAGNOSIS — J4489 Other specified chronic obstructive pulmonary disease: Secondary | ICD-10-CM | POA: Diagnosis present

## 2010-06-21 DIAGNOSIS — K219 Gastro-esophageal reflux disease without esophagitis: Secondary | ICD-10-CM | POA: Diagnosis present

## 2010-06-21 DIAGNOSIS — G4733 Obstructive sleep apnea (adult) (pediatric): Secondary | ICD-10-CM | POA: Diagnosis present

## 2010-06-21 DIAGNOSIS — M1710 Unilateral primary osteoarthritis, unspecified knee: Principal | ICD-10-CM | POA: Diagnosis present

## 2010-06-21 DIAGNOSIS — L02419 Cutaneous abscess of limb, unspecified: Secondary | ICD-10-CM | POA: Diagnosis not present

## 2010-06-21 DIAGNOSIS — J449 Chronic obstructive pulmonary disease, unspecified: Secondary | ICD-10-CM | POA: Diagnosis present

## 2010-06-21 DIAGNOSIS — Z87891 Personal history of nicotine dependence: Secondary | ICD-10-CM

## 2010-06-21 DIAGNOSIS — I252 Old myocardial infarction: Secondary | ICD-10-CM

## 2010-06-21 DIAGNOSIS — Y831 Surgical operation with implant of artificial internal device as the cause of abnormal reaction of the patient, or of later complication, without mention of misadventure at the time of the procedure: Secondary | ICD-10-CM | POA: Diagnosis not present

## 2010-06-22 LAB — BASIC METABOLIC PANEL
BUN: 13 mg/dL (ref 6–23)
CO2: 29 mEq/L (ref 19–32)
Chloride: 102 mEq/L (ref 96–112)
GFR calc Af Amer: >60 mL/min (ref >60)
Potassium: 4.1 mEq/L (ref 3.5–5.1)

## 2010-06-22 LAB — CBC
Hemoglobin: 10.5 g/dL — ABNORMAL LOW (ref 13.0–17.0)
MCV: 95 fL (ref 78.0–100.0)
Platelets: 131 10*3/uL — ABNORMAL LOW (ref 150–400)
RBC: 3.23 MIL/uL — ABNORMAL LOW (ref 4.22–5.81)
WBC: 8 10*3/uL (ref 4.0–10.5)

## 2010-06-23 LAB — CBC
HCT: 29.1 % — ABNORMAL LOW (ref 39.0–52.0)
MCV: 94.2 fL (ref 78.0–100.0)
RBC: 3.09 MIL/uL — ABNORMAL LOW (ref 4.22–5.81)
WBC: 8.1 10*3/uL (ref 4.0–10.5)

## 2010-06-23 LAB — BASIC METABOLIC PANEL
BUN: 9 mg/dL (ref 6–23)
CO2: 29 mEq/L (ref 19–32)
Chloride: 100 mEq/L (ref 96–112)
Glucose, Bld: 149 mg/dL — ABNORMAL HIGH (ref 70–99)
Potassium: 4 mEq/L (ref 3.5–5.1)

## 2010-06-24 DIAGNOSIS — M79609 Pain in unspecified limb: Secondary | ICD-10-CM

## 2010-06-24 LAB — CBC
Hemoglobin: 9.4 g/dL — ABNORMAL LOW (ref 13.0–17.0)
MCH: 32.3 pg (ref 26.0–34.0)
MCHC: 34.3 g/dL (ref 30.0–36.0)
MCV: 94.2 fL (ref 78.0–100.0)
Platelets: 124 10*3/uL — ABNORMAL LOW (ref 150–400)
RBC: 2.91 MIL/uL — ABNORMAL LOW (ref 4.22–5.81)

## 2010-06-24 LAB — BASIC METABOLIC PANEL
GFR calc Af Amer: >60 mL/min (ref >60)
GFR calc non Af Amer: >60 mL/min (ref >60)
Glucose, Bld: 148 mg/dL — ABNORMAL HIGH (ref 70–99)

## 2010-06-25 LAB — DIFFERENTIAL
Lymphocytes Relative: 19 % (ref 12–46)
Lymphs Abs: 1.1 10*3/uL (ref 0.7–4.0)
Neutrophils Relative %: 65 % (ref 43–77)

## 2010-06-25 LAB — CBC
HCT: 28.4 % — ABNORMAL LOW (ref 39.0–52.0)
Hemoglobin: 9.8 g/dL — ABNORMAL LOW (ref 13.0–17.0)
MCV: 94.4 fL (ref 78.0–100.0)
RBC: 3.01 MIL/uL — ABNORMAL LOW (ref 4.22–5.81)
WBC: 5.9 10*3/uL (ref 4.0–10.5)

## 2010-06-26 ENCOUNTER — Inpatient Hospital Stay (HOSPITAL_COMMUNITY): Payer: Medicare Other

## 2010-06-26 ENCOUNTER — Telehealth: Payer: Self-pay | Admitting: Internal Medicine

## 2010-06-27 LAB — CBC
HCT: 28.1 % — ABNORMAL LOW (ref 39.0–52.0)
Hemoglobin: 9.6 g/dL — ABNORMAL LOW (ref 13.0–17.0)
MCH: 32.7 pg (ref 26.0–34.0)
MCHC: 34.2 g/dL (ref 30.0–36.0)

## 2010-06-28 ENCOUNTER — Ambulatory Visit (INDEPENDENT_AMBULATORY_CARE_PROVIDER_SITE_OTHER): Payer: Medicare Other

## 2010-06-28 ENCOUNTER — Encounter: Payer: Self-pay | Admitting: Internal Medicine

## 2010-06-28 DIAGNOSIS — J301 Allergic rhinitis due to pollen: Secondary | ICD-10-CM | POA: Insufficient documentation

## 2010-07-01 NOTE — Op Note (Signed)
NAME:  ALEXY, HELDT                ACCOUNT NO.:  [id number]  MEDICAL RECORD NO.:  [id number]           PATIENT TYPE:  I  LOCATION:  5013                         FACILITY:  [facility]  PHYSICIAN:  Dyke Brackett, M.D.    DATE OF BIRTH:  [birth year]  DATE OF PROCEDURE:  06/21/2010 DATE OF DISCHARGE:  06/14/2010                              OPERATIVE REPORT   INDICATIONS:  A 70 year old with intractable knee pain with end-stage osteoarthritis, left knee felt to be amenable to hospitalization, total knee replacement.  PREOPERATIVE DIAGNOSIS:  Osteoarthritis, left knee.  POSTOPERATIVE DIAGNOSIS:  Osteoarthritis, left knee.  OPERATION:  Left total knee replacement, Sigma cemented knee size 5 femur, 4 tibia, 10-mm bearing, with 41-mm All poly 3 peg patella.  SURGEON:  Dyke Brackett, MD  ASSISTANTS:  Mila Homer. Sherlean Foot, M.D. Dan Humphreys, PA  TOURNIQUET TIME:  Approximately 1 hour 15 minutes.  DESCRIPTION OF PROCEDURE:  Sterile prep and drape, exsanguination of leg, placed in to 375.  Straight skin incision medial parapatellar approach to the knee was made.  Identified the most diseased medial compartment, placed the tibial cutting jig with the appropriate valgus inclination with a neutral resection relative to posterior slope of 0, resected about 3-4 mm below the most diseased medial compartment, then placed  the rod into the femoral canal, placed the distal femoral cutting block with a 5-degree valgus inclination.  Cutting blocks were 10-mm of femur, then sized the femur to be size 4, then placed the rotational guide with the Sigma knee using a balanced technique __________menisci.  We released the PCL.  Prior to this, checked the extension gaps, which was 10-mm  and did similar medial release for the valgus inclination of the knee.  Once the rotation was set with the C- clamp at a 10-mm flexion gap, we placed pins, followed by placement of the cutting block with the anterior and  posterior chamfers cuts.  These were  accomplished without difficulty and then the flexion gap was measured equal with the extension gap at 10 mm.  The __________was placed in the posterior tibial, we placed a keel for the standard tibia.  I placed trial keel with 10-mm bearing with trail femur.  Prior to doing the femur, we cut the box cut for the femur, collateralization of the prosthesis and then checked range of motion which was 0 to 110 without any difficulty.  Prior to placing the femoral prosthesis, we stripped the posterior capsule and trimmed some posterior osteophytes and articular cartilage.  The patella was cut leaving about 17 mm of native patella with 3 peg patella and all final components were placed.  We brought the whole knee through a range of motion 0 degrees with no instability, good balance of the knee was noted, and then we closed the knee after the final bearing was placed with #1 Ethibond and 2-0 Vicryl skin clips.  Hemovac drain was placed superolaterally.  The patient was taken to the recovery room in stable condition.    Dyke Brackett, M.D.     WDC/MEDQ  D:  06/21/2010  T:  06/22/2010  Job:  [id number]  Electronically Signed by [name]. Oluwafemi Villella M.D. on 07/01/2010 01:48:59 PM

## 2010-07-04 ENCOUNTER — Ambulatory Visit (INDEPENDENT_AMBULATORY_CARE_PROVIDER_SITE_OTHER): Payer: Medicare Other

## 2010-07-04 ENCOUNTER — Encounter: Payer: Self-pay | Admitting: Internal Medicine

## 2010-07-04 DIAGNOSIS — J301 Allergic rhinitis due to pollen: Secondary | ICD-10-CM

## 2010-07-04 NOTE — Progress Notes (Signed)
Summary: [name]/dr caffrey pt is in [facility] & needs allergy inj can she  Phone Note From Other Clinic   Caller: James ROBERTS WITH DR CAFFREY Call For: [name] Summary of Call: Carlena Sax with Dr Madelon Lips phoned patient is in 5013 at [facility] he had a total left knee last Friday and he is past due for his allergy shot and they want to know if someone can come by and pick up the injection so they can administer it at  the hospital. she can be reached at [phone] Initial call taken by: Vedia Coffer,  June 26, 2010 1:36 PM  Follow-up for Phone Call        Marylyn Ishihara is this okay? Pls advise thanks Vernie Murders  June 26, 2010 3:15 PM   Additional Follow-up for Phone Call Additional follow up Details #1::        PER CDY-allergy vaccine can and should be held until he can return to the office.Reynaldo Minium CMA  June 27, 2010 9:15 AM   I called and informed Carlena Sax of above.Zackery Barefoot CMA  June 27, 2010 9:19 AM

## 2010-07-04 NOTE — Assessment & Plan Note (Signed)
Summary: ALLERGY/CB   Nurse Visit   Allergies: No Known Drug Allergies  Orders Added: 1)  Allergy Injection (1) [95115] 

## 2010-07-05 NOTE — Discharge Summary (Signed)
NAME:  [name], [name]                ACCOUNT NO.:  [id number]  MEDICAL RECORD NO.:  [id number]           PATIENT TYPE:  I  LOCATION:  5013                         FACILITY:  [facility]  PHYSICIAN:  Dyke Brackett, M.D.    DATE OF BIRTH:  1940/12/23  DATE OF ADMISSION:  06/21/2010 DATE OF DISCHARGE:  06/27/2010                              DISCHARGE SUMMARY   DIAGNOSIS:  End-stage osteoarthritis, left knee.  SECONDARY DIAGNOSES: 1. History of hypertension. 2. History of myocardial infarction. 3. Chronic obstructive pulmonary disease. 4. Gastroesophageal reflux disease. 5. Hypothyroidism. 6. Cellulitis, left lower extremity. 7. Constipation. 8. Acute post blood loss anemia.  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  Pain now interferes with sleep, activities of daily living, and safe ambulation.  He wishes to proceed with a left total knee arthroplasty after discussing risks versus benefits of the surgery.  He received cardiac clearance from Dr. Jens Som as well as medical clearance from his primary MD, Dr. Lorenz Coaster.  PAST MEDICAL HISTORY:  Significant for: 1. History of hypertension. 2. MI in the distant past. 3. COPD. 4. GERD. 5. Hypothyroidism.  PAST SURGICAL HISTORY:  Significant for: 1. Removal of mass in his sinuses in 1953. 2. Knee surgery in 1999. 3. Angioplasties, multiple years.  MEDICATIONS AT TIME OF ADMISSION:  Synthroid, baby aspirin, Lipitor, Allegra, blood pressure medication, antibiotic, nose spray, codeine, multivitamins, and glucosamine as well as vitamin D3.  He is not allergic to any medications.  SOCIAL HISTORY:  The patient smoked a pipe in the past but does not smoke currently.  He  does have one glass of wine daily.  He does not use any other tobacco products.  He lives with his wife in a two-story residence with two living children.  FAMILY HISTORY:  Significant for heart disease in his brother, cancer in his father, and strokes and cancer in his grandparents.  REVIEW OF SYSTEMS:  Positive for blurred vision, for which he wears glasses; history of COPD; chest pain in the distant past, none in his recent memory; hemorrhoids in the past; hypothyroidism; skin rashes; and frequent night urination.  PHYSICAL EXAMINATION PREOPERATIVELY:  VITAL SIGNS:  Temperature of 97.6, pulse of 91, blood pressure 143/91.  He is a [employer]'s [employer], 260-pound male. HEENT:  Head is normocephalic and atraumatic.  Eyes pupils equal, round, and reactive to light and accommodation.  Patent nose.  Clear throat. NECK:  Supple, full range of motion. CHEST AND LUNGS:  Clear.  He is recovering from upper respiratory infection at the time of that exam but had no obvious congestion or lack of good breath sounds. CARDIAC:  Regular rate and rhythm. ABDOMEN:  Soft, nontender. CENTRAL NERVOUS SYSTEM:  Neurovascularly intact.  No neural deficits noted. SKIN:  Several psoriatic plaques but no patches about the knees over the area where he will have surgery. MUSCULOSKELETAL:  Left knee range of motion  10-90 degrees with positive crepitus, stable ligaments with trace effusion.  Preoperative x-rays show bone-on-bone changes along the medial compartment as well as significant changes in the patellofemoral lateral compartments as well.  Preoperative labs including CBC, CMET, chest x-ray, EKG, PT and PTT were all within normal limits.  HOSPITAL COURSE:  On the day of admission, the patient was taken to operating room at [facility] where he underwent a left total knee arthroplasty using DePuy components with a size 4 left Sigma femoral component, a size 5 tibial tray rotating platform with MBT keel size  5, a 41-mm oval patella, and a 10-mm tibial rotating platform.  All components were cemented and the procedure was performed without complication.  The patient was placed on perioperative antibiotics.  She was placed on postoperative Lovenox prophylaxis along with mechanical DVT prevention measures such as thigh-high TED hose as well as PAS stockings and early mobilization.  His was placed on a PCA Dilaudid pain pump for pain control, and physical therapy was begun on the first postoperative day.  Postop day 1, the patient was notable for having a slight increase in temperature of 101 which resolved by the following morning.  Pain was well controlled without PCA and it was discontinued. Foley was discontinued without difficulty, and the patient was able to void without assistance.  Physical therapy was begun in earnest with slow but steady results.  Hemoglobin 10.5, hematocrit 30.7.  BMET was within normal limits.  Dressing was intact.  Postoperative day 2, the patient's pain was minimal requiring only one pain pill every several hours of pain control.  Hemoglobin remained steady at 10.2 and WBC of 8.1.  Hemovac was quiescent, discontinued without difficulty.  Calf remained soft and nontender.  Wound was benign.  The patient remained neurovascularly intact making steady improvement with physical therapy. He had decided on [facility] for postoperative rehab and FL-2 forms were prepared and sent with the hopes of transfer the following day.  Postop day 3, the patient was tolerating his diet with moderate pain, was complaining of some increased left leg pain as well as low back pain. T-max 99.2, other vitals stable.  Alert and oriented x3.  He was noted to have notable erythema and tenderness medial to left knee incision down into the calf, still had some pain with range of motion as well, minimal effusion, stable incision.  No breakdown in the wound.  Because of this inflammation and  tenderness pattern, venous Dopplers were performed.  There was no indication of any type of DVT or other blood clot, and the patient was placed on IV antibiotics as well as oral combination of Ancef and doxycycline to treat a suspected cellulitis in the leg.  He continued to make slow but steady progress with physical therapy.  Postoperative day 4, the patient continued to make slow but steady progress in regard to his knee.  He was also complaining of constipation having not had a bowel movement since prior to the surgery. Hemoglobin remained stable at 9.9.  He was afebrile with T-max of 97.5. WBC no increase at 5.9.  Other vitals stable.  He was alert and oriented.  The tenderness and erythema were slightly improved in the left lower extremity.  He continued to have a 1:1 plus effusion with continued pain to range of motion.  He continues his antibiotics as well as treatment for his constipation as well as physical therapy.  It was noted upon further discussion the patient relates he recalls MRSA  skin infection of left knee, but eventually, he was not MRSA positive on admission to testing.  Because of this, he was given IV Ancef as well as IV vancomycin for continued coverage.  On postoperative day 6, the patient complained of moderate pain and had a positive small bowel movement and was making slow but steady progress but continued to have significant swelling in his knee.  His CPM was restarted, and an area of ecchymosis versus erythema distally was monitored.  He noted this was continuing to improve as well as his pain continuing to improve.  He continued to make slow but steady progress with physical therapy although he was noted to have acute blood loss anemia by his laboratory work with a hemoglobin of 9.6.  He had no signs or symptoms related to this condition.  His wound remained clean and dry, and effusion as mentioned before was resolving.  His constipation had resolved  with positive bowel movements and decreased distention of abdomen with good bowel sounds.  He was restarted on oral antibiotics rather than IV and oral accommodation which is what he will be discharged on hopefully.  He will be reexamined on the morning of his discharge, if he remains without signs of further infection and improving cellulitis, he will be discharged to [facility] for continued rehab.  He will return to see Dr. Madelon Lips at the 2-week postoperative mark in one week's time for staple removal.  At the time of his discharge, he was medically stable and orthopedically improved.  MEDICATIONS AT THE TIME OF DISCHARGE: 1. Tylenol 325 mg one to two by mouth every 4 hours as needed for     temperature of greater than 101. 2. Cepacol lozenges 1 tablet by mouth as needed for irritation. 3. Keflex 500 mg p.o. t.i.d. until discontinued. 4. Colace 100 mg p.o. b.i.d. 5. Doxycycline 100 mg p.o. b.i.d. 6. Lovenox 30 mg subcutaneously twice daily for an additional 7 days     for a total of 14 days postoperatively. 7. Fleet's enema as needed rectally for constipation. 8. Synthroid 175 mcg p.o. daily before breakfast. 9. TED hose bilaterally, thigh high. 10.Robaxin 500 mg p.o. q.6 h. p.r.n. spasm. 11.Percocet 1-2 tablets by mouth every 4 hours as needed for pain. 12.Senokot-S one p.o. daily at bedtime as needed for constipation. 13.Restasis ophthalmic drops in both eyes twice daily 1 drop. 14.Fexofenadine 180 mg one tablet by mouth every morning. 15.Finasteride 5 mg one tablet by mouth nightly. 16.Ipratropium nasal solution 0.06% one spray nasally twice daily as     needed for nasal congestion. 17.Lipitor 40 mg one p.o. daily nightly. 18.Omeprazole 20 mg one tablet by mouth q.a.m. 19.Ramipril 10 mg by mouth nightly. 20.Symbicort 1 puff inhaled daily as needed for wheezing.  ACTIVITIES:  Weightbearing as tolerated with walker, 3 in 1 commode type chair.  CPM 0-65, may  increase 5-10 degrees for patient comfort to 90 degrees, 6-8 hours per day.  Dressing changes daily.  The patient may shower as long as clean dry dressing is reapplied after shower.  He should continue with physical therapy on a daily basis as well as the CPM machine to improve his ability to ambulate as well as strengthen. He should return to see Dr. Madelon Lips sooner than one week's time should he have any increase in temperature of greater than 101 degrees, any increase in pain, not well-controlled by oral pain medication, or any breakdown or drainage from the wound.  Once again, Mr. Vandam' diagnosis  was end-stage osteoarthritis of left knee.  PROCEDURE WHILE IN-HOSPITAL:  Left total knee arthroplasty.     Laural Benes. [name]   ______________________________ Dyke Brackett, M.D.    JBR/MEDQ  D:  06/27/2010  T:  06/27/2010  Job:  [id number]  Electronically Signed by Dan Humphreys P.A. on 07/02/2010 11:49:52 AM Electronically Signed by [name]. Nazarene Bunning M.D. on 07/04/2010 09:44:43 AM

## 2010-07-09 NOTE — Assessment & Plan Note (Signed)
Summary: ALLERGY/CB   Nurse Visit   Allergies: No Known Drug Allergies  Orders Added: 1)  Allergy Injection (1) [95115] 

## 2010-07-12 ENCOUNTER — Encounter: Payer: Self-pay | Admitting: Internal Medicine

## 2010-07-12 ENCOUNTER — Ambulatory Visit (INDEPENDENT_AMBULATORY_CARE_PROVIDER_SITE_OTHER): Payer: Medicare Other

## 2010-07-12 DIAGNOSIS — J301 Allergic rhinitis due to pollen: Secondary | ICD-10-CM

## 2010-07-16 NOTE — Assessment & Plan Note (Signed)
Summary: allergy/cb  Nurse Visit   Allergies: No Known Drug Allergies  Orders Added: 1)  Allergy Injection (1) [95115] 

## 2010-07-19 ENCOUNTER — Ambulatory Visit (INDEPENDENT_AMBULATORY_CARE_PROVIDER_SITE_OTHER): Payer: Medicare Other

## 2010-07-19 DIAGNOSIS — J301 Allergic rhinitis due to pollen: Secondary | ICD-10-CM

## 2010-07-23 ENCOUNTER — Ambulatory Visit (INDEPENDENT_AMBULATORY_CARE_PROVIDER_SITE_OTHER): Payer: Medicare Other

## 2010-07-23 DIAGNOSIS — J301 Allergic rhinitis due to pollen: Secondary | ICD-10-CM

## 2010-07-30 ENCOUNTER — Ambulatory Visit (INDEPENDENT_AMBULATORY_CARE_PROVIDER_SITE_OTHER): Payer: Medicare Other

## 2010-07-30 DIAGNOSIS — J301 Allergic rhinitis due to pollen: Secondary | ICD-10-CM

## 2010-08-06 ENCOUNTER — Ambulatory Visit (INDEPENDENT_AMBULATORY_CARE_PROVIDER_SITE_OTHER): Payer: Medicare Other

## 2010-08-06 DIAGNOSIS — J301 Allergic rhinitis due to pollen: Secondary | ICD-10-CM

## 2010-08-13 ENCOUNTER — Ambulatory Visit (INDEPENDENT_AMBULATORY_CARE_PROVIDER_SITE_OTHER): Payer: Medicare Other

## 2010-08-13 DIAGNOSIS — J309 Allergic rhinitis, unspecified: Secondary | ICD-10-CM

## 2010-08-14 ENCOUNTER — Ambulatory Visit (INDEPENDENT_AMBULATORY_CARE_PROVIDER_SITE_OTHER): Payer: Medicare Other

## 2010-08-14 DIAGNOSIS — J309 Allergic rhinitis, unspecified: Secondary | ICD-10-CM

## 2010-08-20 ENCOUNTER — Ambulatory Visit (INDEPENDENT_AMBULATORY_CARE_PROVIDER_SITE_OTHER): Payer: Medicare Other

## 2010-08-20 DIAGNOSIS — J309 Allergic rhinitis, unspecified: Secondary | ICD-10-CM

## 2010-08-26 ENCOUNTER — Ambulatory Visit: Payer: Self-pay | Admitting: Internal Medicine

## 2010-08-26 NOTE — Discharge Summary (Signed)
  NAME:  James, [name] NO.:  [id number]  MEDICAL RECORD NO.:  [id number]           PATIENT TYPE:  LOCATION:                                 FACILITY:  PHYSICIAN:  Dyke Brackett, M.D.    DATE OF BIRTH:  1940/05/09  DATE OF ADMISSION: DATE OF DISCHARGE:                              DISCHARGE SUMMARY   ADDENDUM.  DISCHARGE INSTRUCTIONS:  Diet is irregular.     Laural Benes. [name]   ______________________________ Dyke Brackett, M.D.    JBR/MEDQ  D:  06/27/2010  T:  06/28/2010  Job:  [id number]  Electronically Signed by [name]. on 07/23/2010 03:36:04 PM Electronically Signed by [name]. [name] M.D. on 08/26/2010 05:10:46 PM

## 2010-08-27 ENCOUNTER — Ambulatory Visit (INDEPENDENT_AMBULATORY_CARE_PROVIDER_SITE_OTHER): Payer: Medicare Other

## 2010-08-27 DIAGNOSIS — J309 Allergic rhinitis, unspecified: Secondary | ICD-10-CM

## 2010-09-02 ENCOUNTER — Ambulatory Visit (INDEPENDENT_AMBULATORY_CARE_PROVIDER_SITE_OTHER): Payer: Medicare Other

## 2010-09-02 DIAGNOSIS — J309 Allergic rhinitis, unspecified: Secondary | ICD-10-CM

## 2010-09-09 ENCOUNTER — Ambulatory Visit (INDEPENDENT_AMBULATORY_CARE_PROVIDER_SITE_OTHER): Payer: Medicare Other

## 2010-09-09 DIAGNOSIS — J309 Allergic rhinitis, unspecified: Secondary | ICD-10-CM

## 2010-09-10 NOTE — Procedures (Signed)
NAME:  [name], [name]                ACCOUNT NO.:  [id number]   MEDICAL RECORD NO.:  [id number]          PATIENT TYPE:  OUT   LOCATION:  SLEEP CENTER                 FACILITY:  [facility]   PHYSICIAN:  Clinton D. Maple Hudson, MD, FCCP, FACPDATE OF BIRTH:  [birth year] 17[birth year] 1938   DATE OF STUDY:  07/27/2007                            NOCTURNAL POLYSOMNOGRAM   REFERRING PHYSICIAN:  Reuben Likes, M.D.   INDICATIONS FOR STUDY:  Hypersomnia with sleep apnea.  Apnea sleepiness  score 12/24.  BMI 35.9, weight 250 pounds, height 70 inches, neck 18  inches.   HOME MEDICATION:  Charted and reviewed.   SLEEP ARCHITECTURE:  Total sleep time 321 minutes with sleep efficiency  84.7%.  Stage 1 was 5.8%.  Stage 2 73.7%, Stage 3 absent.  REM 20.6% of  total sleep time.  Sleep latency 9.5 minutes.  REM latency 82.5 minutes.  Awake after sleep onset, 48.5 minutes.  Arousal index 4.3.   BEDTIME MEDICATIONS:  Charted and reviewed.   RESPIRATORY DATA:  Apnea/hypopnea index (AHI) 2.8 per hour, respiratory  disturbance index (RDI) 5 per hour.  A total of 15 events were scored of  which 3 were central apneas, 2 mixed apneas and 10 hypopneas.  Most  events occurred while he was sleeping off the flat of his back.  REM AHI  9.1.  There were insufficient events to promote CPAP titration by split  protocol on this study night.   OXYGEN DATA:  Moderate snoring with oxygen desaturation to a nadir of  88%.  Mean oxygen saturation through the study was 93.3% on room air.   CARDIAC DATA:  Normal sinus rhythm with occasional PVC.   Movement/Parasomnia:  No significant movement disturbance.  Bathroom x1.   IMPRESSION/RECOMMENDATION:  1. Occasional respiratory events within normal limits, AHI 2.8 per      hour.  His respiratory disturbance index was borderline at 5 per      hour with upper limit of normal being 5 per hour.  Moderately loud      snoring with oxygen      desaturation to a nadir of 88%.  2. He had insufficient events  to permit use of  CPAP titration by      split protocol on his study night.      Clinton D. Maple Hudson, MD, FCCP, FACP  Diplomate, Biomedical engineer of Sleep Medicine  Electronically Signed     CDY/MEDQ  D:  07/31/2007 14:17:03  T:  07/31/2007 15:56:21  Job:  [id number]

## 2010-09-10 NOTE — Assessment & Plan Note (Signed)
[facility]                             PULMONARY OFFICE NOTE   NAME:[name], James CHOW                       MRN:          [id number]  DATE:01/04/2007                            DOB:          May 19, 1940    PROBLEMS:  1. Chronic obstructive pulmonary disease with recurrent bronchitis.  2. Allergic asthma.   HISTORY:  He says breathing has been great since he was last here.  Nebulizer treatment and Depo-Medrol injection did help when he needed  them in March.  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  He does not want  to make changes.  He brings his medication list, which is again reviewed  and charted.   OBJECTIVE:  Weight 188 pounds, BP 118/78, pulse 85, room air saturation  97%.  Trace expiratory wheeze bilaterally, but unlabored.  Heart sounds are regular without murmur.  There is no neck vein  distention or peripheral edema.   IMPRESSION:  Recurrent bronchitis with a significant atopic component.  Pulmonary function tests in 2007 had been normal.   PLAN:  Continue allergy vaccine.  Okay to try taking one-half of a 180-  mg fexofenadine p.r.n.  Scheduled to return in 1 year, but earlier  p.r.n.     Clinton D. Maple Hudson, MD, Tonny Bollman, FACP  Electronically Signed    CDY/MedQ  DD: 01/10/2007  DT: 01/11/2007  Job #: [id number]   cc:   Reuben Likes, M.D.  Madaline Savage, M.D.

## 2010-09-13 ENCOUNTER — Other Ambulatory Visit: Payer: Self-pay | Admitting: Emergency Medicine

## 2010-09-13 ENCOUNTER — Ambulatory Visit
Admission: RE | Admit: 2010-09-13 | Discharge: 2010-09-13 | Disposition: A | Discharge status: Home / Self Care | Payer: Medicare Other | Source: Ambulatory Visit | Attending: Emergency Medicine | Admitting: Emergency Medicine

## 2010-09-13 DIAGNOSIS — M7989 Other specified soft tissue disorders: Secondary | ICD-10-CM

## 2010-09-13 NOTE — Op Note (Signed)
NAME:  [name], [name]                          ACCOUNT NO.:  [id number]   MEDICAL RECORD NO.:  [id number]                   PATIENT TYPE:  AMB   LOCATION:  ENDO                                 FACILITY:  [facility]   PHYSICIAN:  Petra Kuba, M.D.                 DATE OF BIRTH:  05-15-1940   DATE OF PROCEDURE:  06/02/2002  DATE OF DISCHARGE:                                 OPERATIVE REPORT   PROCEDURE:  Colonoscopy with biopsy.   INDICATIONS FOR PROCEDURE:  A patient with a history of colon polyps due for  repeat screening.   Consent was signed after risks, benefits, methods, and options were  thoroughly discussed in the office.   MEDICINES USED:  Demerol 85, Versed 9.   DESCRIPTION OF PROCEDURE:  Rectal inspection was pertinent for external  hemorrhoids. Digital exam was negative. The pediatric video adjustable  colonoscope was inserted and easily advanced to the mid transverse. At that  point, the scope began to loop, abdominal pressure was applied and we were  easily able to advance to the ileocecal valve. Despite rolling him on his  back and his right side and then back to his back and his left side, we  could not advance the last inch or two into the cecal pole. We elected to  withdraw. No abnormalities were seen on insertion. On slow withdrawal using  the pediatric adjustable scope, no abnormalities were seen. Once back in the  rectum, the scope was retroflexed pertinent for some internal hemorrhoids.  The regular video colonoscope was inserted and with abdominal pressure on  his left side only, we were able to advance to the cecal pole, again no  abnormalities were seen on insertion. The cecum was identified by the  appendiceal orifice and the ileocecal valve. The scope was slowly withdrawn.  The prep was adequate. There was some liquid stool that required washing and  suctioning on slow withdrawal through the colon. Other than two tiny  probable hyperplastic appearing  sigmoid polyps which we had cold biopsied,  no other abnormalities were seen. The scope was withdrawn back to the rectum  but not re-retroflexed. The scope was reinserted a short ways up the left  side of the colon, air was suctioned, scope removed. The patient tolerated  the procedure well. There was no obvious or immediate complications.   ENDOSCOPIC DIAGNOSIS:  1. Small internal and external hemorrhoids.  2. Two tiny probable hyperplastic appearing sigmoid polyps cold biopsied.  3. Otherwise within normal limits to the cecum.    PLAN:  Yearly rectals and guaiacs per Dr. Andrey Campanile. Continue workup with an  EGD for his upper tract symptoms. Probable recheck colon screening in five  years but await pathology to be sure.  Petra Kuba, M.D.    MEM/MEDQ  D:  06/02/2002  T:  06/02/2002  Job:  [id number]   cc:   Vale Haven. Andrey Campanile, M.D.  [address]  [place]  [place] [address]  Fax: [phone]

## 2010-09-13 NOTE — Op Note (Signed)
NAME:  [name], [name]                          ACCOUNT NO.:  [id number]   MEDICAL RECORD NO.:  [id number]                   PATIENT TYPE:  AMB   LOCATION:  ENDO                                 FACILITY:  [facility]   PHYSICIAN:  Petra Kuba, M.D.                 DATE OF BIRTH:  [birth year]   DATE OF PROCEDURE:  06/02/2002  DATE OF DISCHARGE:                                 OPERATIVE REPORT   PROCEDURE:  Esophagogastroduodenoscopy with biopsy.   ENDOSCOPIST:  Petra Kuba, M.D.   INDICATIONS:  Upper tract symptoms, longstanding and not responding to his  usual Zantac.  Consent was signed after the risks, benefits, methods and  options were thoroughly discussed in the office before any premedications  given.   PREMEDICATIONS:  No additional medications were given, since this procedure  followed the colonoscopy.   DESCRIPTION OF PROCEDURE:  The video endoscope was inserted by direct  vision.  The proximal and mid esophagus were normal.  In the distal  esophagus, was a small hiatal hernia and probably a short segment of  Barrett's which was biopsied at the end of the procedure.  No obvious  worrisome abnormalities, otherwise, were seen.   The scope was advanced into the stomach and advanced to the antrum where  some minimal antritis was seen.  Advanced through a normal pylorus into the  duodenal bulb.  Again, there was some minimal bulbitis seen and advanced  through a normal C-loop to a normal second portion of the duodenum.  The  scope was slowly withdrawn back to the bulb which confirmed the above  findings.   The scope was withdrawn back to the stomach and retroflexed.  In the cardia,  the hiatal hernia was confirmed.  The angularis, lesser and greater curve,  and fundus were all normal on retroflexed visualization.  Straight  visualization was normal of the stomach except for the mild antritis.  We  did take one biopsy for a CLOtest to rule out Helicobacter.   The scope was  slowly withdrawn back to 20 cm.  No additional esophageal  findings were seen and biopsies of the distal esophagus for Barrett's were  obtained at this junction.  The air was suctioned and the scope removed.   The patient tolerated the procedure well and there were no obvious immediate  complications.   ENDOSCOPIC DIAGNOSES:  1. Small hiatal hernia with probably short segment of Barrett's which was     biopsied.  2. Minimal antritis and bulbitis, probably aspirin-induced; status post     CLOtest biopsy of the antrum to rule out     Helicobacter.  3. Otherwise, within normal limits esophagogastroduodenoscopy.   PLAN:  Change Zantac to Protonix.  Await pathology.  Followup p.r.n. or in  two months.  Petra Kuba, M.D.    MEM/MEDQ  D:  06/02/2002  T:  06/02/2002  Job:  [id number]   cc:   Vale Haven. Andrey Campanile, M.D.  [address]  [place]  [place] [address]  Fax: [phone]

## 2010-09-13 NOTE — Assessment & Plan Note (Signed)
[facility]                               PULMONARY OFFICE NOTE   NAME:[name], [name]                       MRN:          [id number]  DATE:11/26/2005                            DOB:          [birth year]    PROBLEM:  Chronic obstructive pulmonary disease with recurrent bronchitis.   HISTORY:  He comes today for allergy testing reporting recent Cipro for an  infected ingrown fingernail on his right middle finger.  He is off from  Allegra-D for skin tests.  His peak season for respiratory problems is again  stated to be the last part of February through April and most of what he  notices is chest congestion with less discomfort that he relates to nasal  symptoms.   MEDICATIONS:  1.  Synthroid 15 mcg.  2.  Allegra-D one half daily.  3.  Prevacid 30 mg.  4.  Lipitor 20 mg.  5.  Viagra 100 mg p.r.n.  6.  Flomax 0.4 mg.  7.  Spiriva.  8.  Now on Cipro.  9.  Steroid cream for psoriasis.  10. Advair.  11. Nitroglycerin.  12. Rescue Albuterol.   ALLERGIES:  NO MEDICATION ALLERGY.   OBJECTIVE:  GENERAL AND VITAL SIGNS:  Weight is 247 pounds, blood pressure  106/70, pulse regular 87, room air saturation 96%.  He is obese and alert.  SKIN:  There are areas of psoriasis plaques on his arms.  LUNGS:  Breathing is unlabored and pulse is regular.   SKIN TEST:  Positive histamine, negative Diluent controls.  Significant  positive reactions particularly for grass and tree pollens, dust, and dust  mite and animal danders.  He has one cat and two dogs and clearly has no  plans to get rid of them.  We talked again about environmental controls,  available therapies, and the resumption that at least some of his lower  respiratory problems each year represent viral infections, possibly with an  asthma component and not just allergy.  He also suggest that there is an  allergic rhinitis.  We discussed allergy vaccine as a treatment option with  a detailed  discussion of risks, benefits, considerations, the logistics of  allergy vaccine, anaphylaxis, and the question of whether vaccine will help  him.  He is noticing some persistent hoarseness without being aware of  reflux and we agreed that he would try off from Advair for at least a week  to see if that would allow his throat to clear.   PLAN:  1.  Try off from Advair to assess throat hoarseness.  2.  Reflux precautions and awareness.  3.  Change Allegra-D to Allegra 180 mg 1 daily for trial.  4.  His choice is to begin allergy vaccine.  He will be called in when the      vaccine is ready to begin.  5.  Schedule to return with me in two months, earlier p.r.n.  Clinton D. Maple Hudson, MD, [facility], FACP   CDY/MedQ  DD:  11/26/2005  DT:  11/27/2005  Job #:  [id number]   cc:   Reuben Likes, MD  Madaline Savage, MD

## 2010-09-13 NOTE — Assessment & Plan Note (Signed)
[facility]                               PULMONARY OFFICE NOTE   NAME:[name], James MATUSEK                       MRN:          [id number]  DATE:01/22/2006                            DOB:          [birth year]    PROBLEM:  1. Chronic obstructive pulmonary disease with recurrent bronchitis.  2. Allergic asthma.   HISTORY:  He has begun allergy vaccine based on skin testing in August.  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  We have reviewed policy concerning administration outside of a medical  office, anaphylaxis, epinephrine and choices.  He also reminds me his  patient contact with the Hospice patients and home environment includes 2  dogs and a cat.  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  He feels he is doing better overall.  He remains off of Advair which we  stopped at last visit to see if that was causing more throat irritation than  benefit.   MEDICATION:  1. Synthroid 0.15.  2. Fexofenidine180 mg.  3. Prevacid 30 mg.  4. Lipitor 40 mg.  5. Viagra 100 mg as needed.  6. Altace 2.5 mg.  7. Flomax 0.4 mg.  8. Spiriva once daily.  9. Albuterol metered inhaler occasionally.  10.Allergy vaccine.  11.Multivitamins.  12.Aspirin 81 mg enteric.  13.Fluocinonide ointment 0.05% for psoriasis.   OBJECTIVE:  Weight 253 pounds.  Blood pressure 110/70.  Pulse regular 92.  Room air saturation 95%.  He seems comfortable, still overweight.  Voice quality is unremarkable.  No stridor.  Pharynx is not red. I see no  drainage.  There is no cervical adenopathy or neck vein distention.  LUNGS:  Are clear and quiet.  Breathing is unlabored.  HEART:  Sounds regular without murmur or gallop.  No edema.   IMPRESSION:  Asthma/bronchitis and rhinitis with an allergic component.   PLAN:  1. Continue vaccine buildup.  2. Remain off  of Advair which may have been causing throat irritation.  3. Schedule return in 6 months, earlier as needed.       Clinton D. Maple Hudson, MD, [facility], FACP      CDY/MedQ  DD:  01/29/2006  DT:  01/30/2006  Job #:  [id number]   cc:   Reuben Likes, M.D.  Madaline Savage, M.D.

## 2010-09-13 NOTE — Assessment & Plan Note (Signed)
[facility]                             PULMONARY OFFICE NOTE   NAME:[name], [name]                       MRN:          [id number]  DATE:07/14/2006                            DOB:          [birth year]    PROBLEM:  1. Chronic obstructive pulmonary disease with recurrent bronchitis.  2. Allergic asthma.   HISTORY:  Had a bronchitis episode treated by Dr. Lorenz Coaster in January,  which resolved, but then another episode in February, at which time he  took Tamiflu, a Z-Pak, and then doxycycline.  He felt he was getting  over that when he was seen here July 07, 2006, but since then, he has  been feeling gradually worse with increasing wheezy congestion.  mild  nasal congestion.  No significant postnasal drip or sneezing.  He is  especially congested at night.  Feet have not been swelling.  Nothing  purulent, bloody, or painful.   MEDICATIONS:  1. He continue allergy vaccine at 1:10 given by the nurse at work with      no problems.  2. Synthroid 0.15 mg.  3. Fexofenadine 180 mg.  4. Prevacid 30 mg.  5. Lipitor 40 mg.  6. Viagra p.r.n.  7. Altace 2.5 mg.  8. Nitroglycerin spray, rarely needed.  9. Flomax 0.5 mg.  10.Spiriva.  11.Albuterol rescue inhaler p.r.n.  12.Multiple vitamins.  13.Folic acid.  14.Aspirin 81 mg.  15.Garlic oil.  16.He uses fluocinonide ointment 0.05% for his psoriasis.   NO MEDICATION ALLERGY.   OBJECTIVE:  Weight 225 pounds, which is down a little.  BP 120/80.  Pulse regular 86.  Room air saturation 96%.  Wheezy congested cough, but rather talkative, and not using accessory  respiratory muscles.  He is overweight.  HEART:  Sounds are regular without murmur.  There is no peripheral edema.   IMPRESSION:  Sustained asthmatic bronchitis, probably an early allergic  rhinitis, not yet well defined.   PLAN:  1. He will continue his allergy vaccine.  2. Today, he was given albuterol aerosol Depo 80.  3. Prednisone 8-day  taper from 40 mg.  Steroid talk done.  4. Keep scheduled appointment.     Clinton D. Maple Hudson, MD, [facility], FACP  Electronically Signed    CDY/MedQ  DD: 07/18/2006  DT: 07/18/2006  Job #: [id number]   cc:   Reuben Likes, M.D.  Madaline Savage, M.D.

## 2010-09-13 NOTE — Assessment & Plan Note (Signed)
[facility]                             PULMONARY OFFICE NOTE   NAME:[name], James GIROUARD                       MRN:          [id number]  DATE:07/07/2006                            DOB:          Jun 10, 1940    PROBLEM:  1. Chronic obstructive pulmonary disease with recurrent bronchitis.  2. Allergic asthma.   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  He does have an EpiPen.  So far no  problems early in the spring.  He had 2 episodes of bronchitis and on  the last one took Tamiflu, Z-pack and then doxycycline.  He feels he is  largely over those illnesses now.  He is trying to keep animals out of  the bedroom.  He describes his home as dusty and I gave him information  again on environmental precautions, including a discussion of air  cleaners.   MEDICATION:  Meds are listed on the chart and reviewed with little  change from last visit.   OBJECTIVE:  Weight 231 pounds.  Blood pressure 126/80, pulse regular 89.  Room air saturation 97%.  His nose and chest are clear today.  HEART:  Sounds are regular without murmur.  No postnasal drainage.   IMPRESSION:  Bronchitis with an allergic component, recent viral  exacerbation.   PLAN:  We will advance his vaccine to 1:10 with next order.  He will  continue present meds and schedule a return in 6 months, earlier p.r.n.     Clinton D. Maple Hudson, MD, [facility], FACP  Electronically Signed    CDY/MedQ  DD: 07/07/2006  DT: 07/09/2006  Job #: [id number]   cc:   Reuben Likes, M.D.  Madaline Savage, M.D.

## 2010-09-16 ENCOUNTER — Ambulatory Visit (INDEPENDENT_AMBULATORY_CARE_PROVIDER_SITE_OTHER): Payer: Medicare Other

## 2010-09-16 DIAGNOSIS — J309 Allergic rhinitis, unspecified: Secondary | ICD-10-CM

## 2010-09-19 ENCOUNTER — Encounter: Payer: Self-pay | Admitting: Internal Medicine

## 2010-09-24 ENCOUNTER — Ambulatory Visit (INDEPENDENT_AMBULATORY_CARE_PROVIDER_SITE_OTHER): Payer: Medicare Other

## 2010-09-24 DIAGNOSIS — J309 Allergic rhinitis, unspecified: Secondary | ICD-10-CM

## 2010-09-30 ENCOUNTER — Encounter: Payer: Self-pay | Admitting: Internal Medicine

## 2010-09-30 ENCOUNTER — Ambulatory Visit (INDEPENDENT_AMBULATORY_CARE_PROVIDER_SITE_OTHER): Payer: Medicare Other | Admitting: Internal Medicine

## 2010-09-30 ENCOUNTER — Ambulatory Visit (INDEPENDENT_AMBULATORY_CARE_PROVIDER_SITE_OTHER): Payer: Medicare Other

## 2010-09-30 VITALS — BP 112/76 | HR 94 | Ht 71.0 in | Wt 228.6 lb

## 2010-09-30 DIAGNOSIS — J301 Allergic rhinitis due to pollen: Secondary | ICD-10-CM

## 2010-09-30 DIAGNOSIS — J309 Allergic rhinitis, unspecified: Secondary | ICD-10-CM

## 2010-09-30 DIAGNOSIS — J45909 Unspecified asthma, uncomplicated: Secondary | ICD-10-CM

## 2010-09-30 MED ORDER — MOMETASONE FUROATE 50 MCG/ACT NA SUSP: 2.0000 | Freq: Every day | NASAL | Status: DC

## 2010-09-30 NOTE — Progress Notes (Signed)
  Subjective:    Patient ID: [name], male    DOB: Nov 16, 1940, 70 y.o.   MRN: [id number]  HPI 09/30/10- 27 yoM former smoker followed for asthma/ COPD, allergic rhinitis Last here- February 21, 2010.Note reviewed. Since last here had left TKR in February with no respiratory problems.  He is pleased with his allergy vaccine at 1:10, getting injections here. Still has his pets. Otc allegra has been occasional supplement.   Review of Systems Constitutional:   No weight loss, night sweats,  Fevers, chills, fatigue, lassitude. HEENT:   No headaches,  Difficulty swallowing,  Tooth/dental problems,  Sore throat,                No sneezing, itching, ear ache, nasal congestion, post nasal drip,   CV:  No chest pain,  Orthopnea, PND, swelling in lower extremities, anasarca, dizziness, palpitations  GI  No heartburn, indigestion, abdominal pain, nausea, vomiting, diarrhea, change in bowel habits, loss of appetite  Resp: No shortness of breath with exertion or at rest.  No excess mucus, no productive cough,  No non-productive cough,  No coughing up of blood.  No change in color of mucus.  No wheezing.    Skin: no rash or lesions.  GU: no dysuria, change in color of urine, no urgency or frequency.  No flank pain.  MS:  No joint pain or swelling.  No decreased range of motion.  No back pain.  Psych:  No change in mood or affect. No depression or anxiety.  No memory loss.      Objective:   Physical Exam General- Alert, Oriented, Affect-appropriate, Distress- none acute  Skin- rash-none, lesions- none, excoriation- none  Lymphadenopathy- none  Head- atraumatic  Eyes- Gross vision intact, PERRLA, conjunctivae clear secretions  Ears- Hearing, canals, Tm - normal   Nose- Clear, No- Septal dev, mucus, polyps, erosion, perforation   Throat- Mallampati II , mucosa clear , drainage- none, tonsils- atrophic  Neck- flexible , trachea midline, no stridor , thyroid nl, carotid no  bruit  Chest - symmetrical excursion , unlabored     Heart/CV- RRR , no murmur , no gallop  , no rub, nl s1 s2                     - JVD- none , edema- none, stasis changes- none, varices- none     Lung- clear to P&A, wheeze- none, cough- none , dullness-none, rub- none     Chest wall-   Abd- tender-no, distended-no, bowel sounds-present, HSM- no  Br/ Gen/ Rectal- Not done, not indicated  Extrem- cyanosis- none, clubbing, none, atrophy- none, strength- nl   There is healing surgical incision scar at knee  Neuro- grossly intact to observation         Assessment & Plan:

## 2010-09-30 NOTE — Assessment & Plan Note (Addendum)
Excellent control. No additional therapies needed now. He had no respiratory problems during knee surgery.

## 2010-09-30 NOTE — Assessment & Plan Note (Addendum)
Good control as he continues his allergy vaccine without need to change We will refill Nasonex

## 2010-09-30 NOTE — Patient Instructions (Signed)
Continue present treatment - please call as needed 

## 2010-10-03 ENCOUNTER — Encounter: Payer: Self-pay | Admitting: Internal Medicine

## 2010-10-07 ENCOUNTER — Ambulatory Visit (INDEPENDENT_AMBULATORY_CARE_PROVIDER_SITE_OTHER): Payer: Medicare Other

## 2010-10-07 DIAGNOSIS — J309 Allergic rhinitis, unspecified: Secondary | ICD-10-CM

## 2010-10-14 ENCOUNTER — Ambulatory Visit (INDEPENDENT_AMBULATORY_CARE_PROVIDER_SITE_OTHER): Payer: Medicare Other

## 2010-10-14 DIAGNOSIS — J309 Allergic rhinitis, unspecified: Secondary | ICD-10-CM

## 2010-10-21 ENCOUNTER — Ambulatory Visit (INDEPENDENT_AMBULATORY_CARE_PROVIDER_SITE_OTHER): Payer: Medicare Other

## 2010-10-21 DIAGNOSIS — J309 Allergic rhinitis, unspecified: Secondary | ICD-10-CM

## 2010-10-28 ENCOUNTER — Ambulatory Visit (INDEPENDENT_AMBULATORY_CARE_PROVIDER_SITE_OTHER): Payer: Medicare Other

## 2010-10-28 DIAGNOSIS — J309 Allergic rhinitis, unspecified: Secondary | ICD-10-CM

## 2010-11-04 ENCOUNTER — Ambulatory Visit (INDEPENDENT_AMBULATORY_CARE_PROVIDER_SITE_OTHER): Payer: Medicare Other

## 2010-11-04 DIAGNOSIS — J309 Allergic rhinitis, unspecified: Secondary | ICD-10-CM

## 2010-11-18 ENCOUNTER — Ambulatory Visit (INDEPENDENT_AMBULATORY_CARE_PROVIDER_SITE_OTHER): Payer: Medicare Other

## 2010-11-18 DIAGNOSIS — J309 Allergic rhinitis, unspecified: Secondary | ICD-10-CM

## 2010-11-25 ENCOUNTER — Ambulatory Visit (INDEPENDENT_AMBULATORY_CARE_PROVIDER_SITE_OTHER): Payer: Medicare Other

## 2010-11-25 DIAGNOSIS — J309 Allergic rhinitis, unspecified: Secondary | ICD-10-CM

## 2010-12-02 ENCOUNTER — Ambulatory Visit (INDEPENDENT_AMBULATORY_CARE_PROVIDER_SITE_OTHER): Payer: Medicare Other

## 2010-12-02 DIAGNOSIS — J309 Allergic rhinitis, unspecified: Secondary | ICD-10-CM

## 2010-12-09 ENCOUNTER — Ambulatory Visit (INDEPENDENT_AMBULATORY_CARE_PROVIDER_SITE_OTHER): Payer: Medicare Other

## 2010-12-09 DIAGNOSIS — J309 Allergic rhinitis, unspecified: Secondary | ICD-10-CM

## 2010-12-16 ENCOUNTER — Ambulatory Visit (INDEPENDENT_AMBULATORY_CARE_PROVIDER_SITE_OTHER): Payer: Medicare Other

## 2010-12-16 DIAGNOSIS — J309 Allergic rhinitis, unspecified: Secondary | ICD-10-CM

## 2010-12-17 ENCOUNTER — Ambulatory Visit (INDEPENDENT_AMBULATORY_CARE_PROVIDER_SITE_OTHER): Payer: Medicare Other

## 2010-12-17 DIAGNOSIS — J309 Allergic rhinitis, unspecified: Secondary | ICD-10-CM

## 2010-12-23 ENCOUNTER — Ambulatory Visit (INDEPENDENT_AMBULATORY_CARE_PROVIDER_SITE_OTHER): Payer: Medicare Other

## 2010-12-23 DIAGNOSIS — J309 Allergic rhinitis, unspecified: Secondary | ICD-10-CM

## 2010-12-27 ENCOUNTER — Encounter: Payer: Self-pay | Admitting: Internal Medicine

## 2010-12-31 ENCOUNTER — Ambulatory Visit (INDEPENDENT_AMBULATORY_CARE_PROVIDER_SITE_OTHER): Payer: Medicare Other

## 2010-12-31 DIAGNOSIS — J309 Allergic rhinitis, unspecified: Secondary | ICD-10-CM

## 2011-01-08 ENCOUNTER — Ambulatory Visit (INDEPENDENT_AMBULATORY_CARE_PROVIDER_SITE_OTHER): Payer: Medicare Other

## 2011-01-08 DIAGNOSIS — J309 Allergic rhinitis, unspecified: Secondary | ICD-10-CM

## 2011-01-14 ENCOUNTER — Ambulatory Visit (INDEPENDENT_AMBULATORY_CARE_PROVIDER_SITE_OTHER): Payer: Medicare Other

## 2011-01-14 DIAGNOSIS — J309 Allergic rhinitis, unspecified: Secondary | ICD-10-CM

## 2011-01-20 DIAGNOSIS — Z23 Encounter for immunization: Secondary | ICD-10-CM

## 2011-01-21 ENCOUNTER — Ambulatory Visit (INDEPENDENT_AMBULATORY_CARE_PROVIDER_SITE_OTHER): Payer: Medicare Other

## 2011-01-21 DIAGNOSIS — Z23 Encounter for immunization: Secondary | ICD-10-CM

## 2011-01-21 DIAGNOSIS — J309 Allergic rhinitis, unspecified: Secondary | ICD-10-CM

## 2011-01-28 ENCOUNTER — Ambulatory Visit (INDEPENDENT_AMBULATORY_CARE_PROVIDER_SITE_OTHER): Payer: Medicare Other

## 2011-01-28 DIAGNOSIS — J309 Allergic rhinitis, unspecified: Secondary | ICD-10-CM

## 2011-02-03 ENCOUNTER — Ambulatory Visit (INDEPENDENT_AMBULATORY_CARE_PROVIDER_SITE_OTHER): Payer: Medicare Other

## 2011-02-03 DIAGNOSIS — J309 Allergic rhinitis, unspecified: Secondary | ICD-10-CM

## 2011-02-11 ENCOUNTER — Ambulatory Visit (INDEPENDENT_AMBULATORY_CARE_PROVIDER_SITE_OTHER): Payer: Medicare Other

## 2011-02-11 DIAGNOSIS — J309 Allergic rhinitis, unspecified: Secondary | ICD-10-CM

## 2011-02-17 ENCOUNTER — Ambulatory Visit (INDEPENDENT_AMBULATORY_CARE_PROVIDER_SITE_OTHER): Payer: Medicare Other

## 2011-02-17 DIAGNOSIS — J309 Allergic rhinitis, unspecified: Secondary | ICD-10-CM

## 2011-02-21 ENCOUNTER — Ambulatory Visit (INDEPENDENT_AMBULATORY_CARE_PROVIDER_SITE_OTHER): Payer: Medicare Other | Admitting: Licensed Clinical Social Worker

## 2011-02-21 DIAGNOSIS — F4322 Adjustment disorder with anxiety: Secondary | ICD-10-CM

## 2011-02-25 ENCOUNTER — Ambulatory Visit (INDEPENDENT_AMBULATORY_CARE_PROVIDER_SITE_OTHER): Payer: Medicare Other

## 2011-02-25 DIAGNOSIS — J309 Allergic rhinitis, unspecified: Secondary | ICD-10-CM

## 2011-02-27 ENCOUNTER — Encounter: Payer: Self-pay | Admitting: Cardiology

## 2011-02-27 ENCOUNTER — Ambulatory Visit (INDEPENDENT_AMBULATORY_CARE_PROVIDER_SITE_OTHER): Payer: Medicare Other | Admitting: Cardiology

## 2011-02-27 VITALS — BP 134/81 | HR 82 | Ht 71.0 in | Wt 231.0 lb

## 2011-02-27 DIAGNOSIS — I2510 Atherosclerotic heart disease of native coronary artery without angina pectoris: Secondary | ICD-10-CM

## 2011-02-27 NOTE — Assessment & Plan Note (Signed)
Continue aspirin and statin.plan myoview for risk stratification.

## 2011-02-27 NOTE — Patient Instructions (Signed)
Your physician wants you to follow-up in: ONE YEAR You will receive a reminder letter in the mail two months in advance. If you don't receive a letter, please call our office to schedule the follow-up appointment.   Your physician has requested that you have en exercise stress myoview. For further information please visit [email or web address]. Please follow instruction sheet, as given.   

## 2011-02-27 NOTE — Progress Notes (Signed)
RUE:[id number] gentleman with history of coronary disease for F/U. Patient has had previous PCI of his LAD. His last Myoview was performed in October of 2009 and showed an ejection fraction of 44%. There was a fixed anterior defect consistent with scar but no ischemia. He also had an echocardiogram at that time that showed an ejection fraction of 50-55% and trace aortic insufficiency. I last saw him in Nov 2011. Since then the patient denies any dyspnea on exertion, orthopnea, PND, pedal edema, palpitations, syncope or chest pain.   Current Outpatient Prescriptions  Medication Sig Dispense Refill  . amoxicillin (AMOXIL) 500 MG capsule Prn dental       . aspirin EC 81 MG tablet 1 tablet Mon, Wed, Fri       . atorvastatin (LIPITOR) 40 MG tablet Take 40 mg by mouth daily.        . betamethasone dipropionate (DIPROLENE) 0.05 % ointment As needed       . Cholecalciferol (VITAMIN D3) 2000 UNITS TABS Once a day       . cycloSPORINE (RESTASIS) 0.05 % ophthalmic emulsion Place 1 drop into both eyes 2 (two) times daily.        . fexofenadine (ALLEGRA) 180 MG tablet Take 180 mg by mouth daily.        . finasteride (PROSCAR) 5 MG tablet Take 5 mg by mouth daily.        Marland Kitchen glucosamine-chondroitin 500-400 MG tablet 2 tablets at breakfast      . levothyroxine (SYNTHROID, LEVOTHROID) 150 MCG tablet Take 150 mcg by mouth daily.        . mometasone (NASONEX) 50 MCG/ACT nasal spray Place 2 sprays into the nose daily. Use as directed  17 g  prn  . Multiple Vitamins-Minerals (SENIOR MULTIVITAMIN PLUS PO) Once a day       . naproxen sodium (ANAPROX) 220 MG tablet Take 1 by mouth once a day as needed      . nitroGLYCERIN (NITROLINGUAL) 0.4 MG/SPRAY spray Use as directed as needed       . OMEGA 3 1000 MG CAPS Take 1 capsule by mouth daily.        Marland Kitchen omeprazole (PRILOSEC) 20 MG capsule Take 20 mg by mouth daily.        . ramipril (ALTACE) 10 MG capsule Take 1 tablet by mouth At bedtime.         Past Medical History    Diagnosis Date  . Hyperlipidemia   . Hiatal hernia   . GERD (gastroesophageal reflux disease)   . Hypothyroidism   . Allergic asthma   . COPD (chronic obstructive pulmonary disease)   . CAD (coronary artery disease)   . BPH (benign prostatic hyperplasia)   . Psoriasis     Past Surgical History  Procedure Date  . Left knee arthroscopy   . Tonsillectomy   . Nasal sinus surgery     History   Social History  . Marital Status: Married    Spouse Name: N/A    Number of Children: N/A  . Years of Education: N/A   Occupational History  . Minister    Social History Main Topics  . Smoking status: Former Games developer  . Smokeless tobacco: Not on file  . Alcohol Use: Yes  . Drug Use: No  . Sexually Active: Not on file   Other Topics Concern  . Not on file   Social History Narrative  . No narrative on file    ROS: no fevers  or chills, productive cough, hemoptysis, dysphasia, odynophagia, melena, hematochezia, dysuria, hematuria, rash, seizure activity, orthopnea, PND, pedal edema, claudication. Remaining systems are negative.  Physical Exam: Well-developed well-nourished in no acute distress.  Skin is warm and dry.  HEENT is normal.  Neck is supple. No thyromegaly.  Chest is clear to auscultation with normal expansion.  Cardiovascular exam is regular rate and rhythm.  Abdominal exam nontender or distended. No masses palpated. Extremities show no edema. neuro grossly intact  ECG normal sinus rhythm at a rate of 82. Right bundle branch block.

## 2011-02-27 NOTE — Assessment & Plan Note (Signed)
Continue statin. Lipids and liver monitored by primary care. 

## 2011-03-03 ENCOUNTER — Ambulatory Visit (INDEPENDENT_AMBULATORY_CARE_PROVIDER_SITE_OTHER): Payer: Medicare Other

## 2011-03-03 DIAGNOSIS — J309 Allergic rhinitis, unspecified: Secondary | ICD-10-CM

## 2011-03-10 ENCOUNTER — Ambulatory Visit (INDEPENDENT_AMBULATORY_CARE_PROVIDER_SITE_OTHER): Payer: Medicare Other

## 2011-03-10 DIAGNOSIS — J309 Allergic rhinitis, unspecified: Secondary | ICD-10-CM

## 2011-03-17 ENCOUNTER — Ambulatory Visit (INDEPENDENT_AMBULATORY_CARE_PROVIDER_SITE_OTHER): Payer: Medicare Other

## 2011-03-17 DIAGNOSIS — J309 Allergic rhinitis, unspecified: Secondary | ICD-10-CM

## 2011-03-25 ENCOUNTER — Ambulatory Visit (INDEPENDENT_AMBULATORY_CARE_PROVIDER_SITE_OTHER): Payer: Medicare Other

## 2011-03-25 DIAGNOSIS — J309 Allergic rhinitis, unspecified: Secondary | ICD-10-CM

## 2011-03-31 ENCOUNTER — Ambulatory Visit (INDEPENDENT_AMBULATORY_CARE_PROVIDER_SITE_OTHER): Payer: Medicare Other

## 2011-03-31 ENCOUNTER — Encounter: Payer: Self-pay | Admitting: Internal Medicine

## 2011-03-31 DIAGNOSIS — J309 Allergic rhinitis, unspecified: Secondary | ICD-10-CM

## 2011-04-03 ENCOUNTER — Ambulatory Visit (HOSPITAL_COMMUNITY): Payer: Medicare Other | Attending: Internal Medicine | Admitting: Radiology

## 2011-04-03 VITALS — Ht 71.0 in | Wt 228.0 lb

## 2011-04-03 DIAGNOSIS — Z87891 Personal history of nicotine dependence: Secondary | ICD-10-CM | POA: Insufficient documentation

## 2011-04-03 DIAGNOSIS — E785 Hyperlipidemia, unspecified: Secondary | ICD-10-CM | POA: Insufficient documentation

## 2011-04-03 DIAGNOSIS — Z9861 Coronary angioplasty status: Secondary | ICD-10-CM | POA: Insufficient documentation

## 2011-04-03 DIAGNOSIS — I2510 Atherosclerotic heart disease of native coronary artery without angina pectoris: Secondary | ICD-10-CM

## 2011-04-03 DIAGNOSIS — J4489 Other specified chronic obstructive pulmonary disease: Secondary | ICD-10-CM | POA: Insufficient documentation

## 2011-04-03 DIAGNOSIS — E669 Obesity, unspecified: Secondary | ICD-10-CM | POA: Insufficient documentation

## 2011-04-03 DIAGNOSIS — J449 Chronic obstructive pulmonary disease, unspecified: Secondary | ICD-10-CM | POA: Insufficient documentation

## 2011-04-03 DIAGNOSIS — I10 Essential (primary) hypertension: Secondary | ICD-10-CM | POA: Insufficient documentation

## 2011-04-03 DIAGNOSIS — I4510 Unspecified right bundle-branch block: Secondary | ICD-10-CM

## 2011-04-03 MED ORDER — TECHNETIUM TC 99M TETROFOSMIN IV KIT
33.0000 | PACK | Freq: Once | INTRAVENOUS | Status: AC | PRN
  Administered 2011-04-03: 33 via INTRAVENOUS

## 2011-04-03 MED ORDER — TECHNETIUM TC 99M TETROFOSMIN IV KIT
11.0000 | PACK | Freq: Once | INTRAVENOUS | Status: AC | PRN
  Administered 2011-04-03: 11 via INTRAVENOUS

## 2011-04-03 NOTE — Progress Notes (Signed)
[facility] SITE 3 NUCLEAR MED [address] [place] [address] [phone]  Cardiology Nuclear Med Study  [name] is a 70 y.o. male [id number] 1940-12-27   Nuclear Med Background Indication for Stress Test:  Evaluation for Ischemia and PTCA Patency History:  COPD and H/O Multiple PCI's; '97 MI>PTCA-LAD; '09 MPS:No ischemia, fixed inferior defect, EF=44%; '09 Echo:EF=50-55% Cardiac Risk Factors: History of Smoking, Hypertension, Lipids, Obesity and RBBB  Symptoms:  No cardiac complaints   Nuclear Pre-Procedure Caffeine/Decaff Intake:  7:00pm NPO After: 7:00pm   Lungs:  Clear.   IV 0.9% NS with Angio Cath:  20g  IV Site: R Hand  IV Started by:  Cathlyn Parsons, RN  Chest Size (in):  46 Cup Size: n/a  Height: 5\' 11"  (1.803 m)  Weight:  228 lb (103.42 kg)  BMI:  Body mass index is 31.80 kg/(m^2). Tech Comments:  n/a    Nuclear Med Study 1 or 2 day study: 1 day  Stress Test Type:  Stress  Reading MD: Dietrich Pates, MD  Order Authorizing Provider:  Ripley Fraise  Resting Radionuclide: Technetium 33m Tetrofosmin  Resting Radionuclide Dose: 11.0 mCi   Stress Radionuclide:  Technetium 8m Tetrofosmin  Stress Radionuclide Dose: 33.0 mCi           Stress Protocol Rest HR: 72 Stress HR: 146  Rest BP: Sitting:127/86  Standing:132/85 Stress BP: 192/85  Exercise Time (min): 6:00 METS: 7.0   Predicted Max HR: 148 bpm % Max HR: 98.65 bpm Rate Pressure Product: 21308   Dose of Adenosine (mg):  n/a Dose of Lexiscan: n/a mg  Dose of Atropine (mg): n/a Dose of Dobutamine: n/a mcg/kg/min (at max HR)  Stress Test Technologist: Smiley Houseman, CMA-N  Nuclear Technologist:  Doyne Keel, CNMT     Rest Procedure:  Myocardial perfusion imaging was performed at rest 45 minutes following the intravenous administration of Technetium 42m Tetrofosmin.  Rest ECG: RBBB with occasional PVC's.  Stress Procedure:  The patient exercised for six minutes on the  treadmill utilizing the Bruce protocol.  The patient stopped due to fatigue and denied any chest pain.  There were nonspecific ST-T wave changes in V2 and V3 with exercise.  There were frequent PVC's with occasional couplets.  Technetium 74m Tetrofosmin was injected at peak exercise and myocardial perfusion imaging was performed after a brief delay.  Stress ECG: No significant change from baseline ECG  QPS Raw Data Images:  Images were motion corrected.  Soft tissue (diaphragm, bowel activity) underlie heart. Stress Images:  Defect in the distal anterior, mid/distal anteroseptal wall and apex.  Defect in the inferior/inferoseptal region (base, minimally mid). Rest Images:  Minimal change from the stress images.  Improvement in the mid anteroseptal region only. Subtraction (SDS):  By quantitation signif in the anterior region. Transient Ischemic Dilatation (Normal <1.22):  0.96 Lung/Heart Ratio (Normal <0.45):  0.36  Quantitative Gated Spect Images QGS EDV:  154 ml QGS ESV:  79 ml QGS cine images:  Apical hypokinesis.  LVEF 49% QGS EF: 49%  Impression Exercise Capacity:  Fair exercise capacity. BP Response:  Normal blood pressure response. Clinical Symptoms:  No chest pain. ECG Impression:  No significant ST segment change suggestive of ischemia. Comparison with Prior Nuclear Study  Scan done elsewhere showed reported anterior scar. Overall Impression:  Scar and mild pariinfarct ischemia in the distal anterior, mid/distal anteroseptal and apical wall.  Inferior/inferoseptal changes consistent with probable soft tissue attenuation,  Cannot completely exclude subendocardial scar.  LVEF 49%   Dietrich Pates

## 2011-04-07 ENCOUNTER — Telehealth: Payer: Self-pay | Admitting: Cardiology

## 2011-04-07 ENCOUNTER — Ambulatory Visit (INDEPENDENT_AMBULATORY_CARE_PROVIDER_SITE_OTHER): Payer: Medicare Other

## 2011-04-07 DIAGNOSIS — J309 Allergic rhinitis, unspecified: Secondary | ICD-10-CM

## 2011-04-07 NOTE — Telephone Encounter (Signed)
New message:  Pt had nuclear study on Thursday and has not gotten the results.  Please check and call patient back.  Cell number [phone]

## 2011-04-07 NOTE — Telephone Encounter (Signed)
Pt aware of stress test results. Pt was very pleased with the promptness, thoroughness. And care he received by the NM techs Sherri, and Tonya. Mylo Red RN

## 2011-04-14 ENCOUNTER — Ambulatory Visit (INDEPENDENT_AMBULATORY_CARE_PROVIDER_SITE_OTHER): Payer: Medicare Other

## 2011-04-14 DIAGNOSIS — J309 Allergic rhinitis, unspecified: Secondary | ICD-10-CM

## 2011-04-15 ENCOUNTER — Ambulatory Visit (INDEPENDENT_AMBULATORY_CARE_PROVIDER_SITE_OTHER): Payer: Medicare Other

## 2011-04-15 DIAGNOSIS — J309 Allergic rhinitis, unspecified: Secondary | ICD-10-CM

## 2011-04-21 ENCOUNTER — Ambulatory Visit (INDEPENDENT_AMBULATORY_CARE_PROVIDER_SITE_OTHER): Payer: Medicare Other

## 2011-04-21 DIAGNOSIS — J309 Allergic rhinitis, unspecified: Secondary | ICD-10-CM

## 2011-04-28 ENCOUNTER — Ambulatory Visit (INDEPENDENT_AMBULATORY_CARE_PROVIDER_SITE_OTHER): Payer: Medicare Other

## 2011-04-28 DIAGNOSIS — J309 Allergic rhinitis, unspecified: Secondary | ICD-10-CM

## 2011-05-05 ENCOUNTER — Ambulatory Visit (INDEPENDENT_AMBULATORY_CARE_PROVIDER_SITE_OTHER): Payer: Medicare Other

## 2011-05-05 DIAGNOSIS — J309 Allergic rhinitis, unspecified: Secondary | ICD-10-CM | POA: Diagnosis not present

## 2011-05-12 ENCOUNTER — Ambulatory Visit (INDEPENDENT_AMBULATORY_CARE_PROVIDER_SITE_OTHER): Payer: Medicare Other

## 2011-05-12 DIAGNOSIS — J309 Allergic rhinitis, unspecified: Secondary | ICD-10-CM

## 2011-05-20 ENCOUNTER — Ambulatory Visit (INDEPENDENT_AMBULATORY_CARE_PROVIDER_SITE_OTHER): Payer: Medicare Other

## 2011-05-20 DIAGNOSIS — J309 Allergic rhinitis, unspecified: Secondary | ICD-10-CM | POA: Diagnosis not present

## 2011-05-28 ENCOUNTER — Ambulatory Visit (INDEPENDENT_AMBULATORY_CARE_PROVIDER_SITE_OTHER): Payer: Medicare Other

## 2011-05-28 DIAGNOSIS — J309 Allergic rhinitis, unspecified: Secondary | ICD-10-CM | POA: Diagnosis not present

## 2011-06-03 ENCOUNTER — Ambulatory Visit (INDEPENDENT_AMBULATORY_CARE_PROVIDER_SITE_OTHER): Payer: Medicare Other

## 2011-06-03 DIAGNOSIS — J309 Allergic rhinitis, unspecified: Secondary | ICD-10-CM | POA: Diagnosis not present

## 2011-06-05 DIAGNOSIS — M1710 Unilateral primary osteoarthritis, unspecified knee: Secondary | ICD-10-CM | POA: Diagnosis not present

## 2011-06-09 ENCOUNTER — Ambulatory Visit (INDEPENDENT_AMBULATORY_CARE_PROVIDER_SITE_OTHER): Payer: Medicare Other

## 2011-06-09 DIAGNOSIS — J309 Allergic rhinitis, unspecified: Secondary | ICD-10-CM | POA: Diagnosis not present

## 2011-06-13 DIAGNOSIS — E7800 Pure hypercholesterolemia, unspecified: Secondary | ICD-10-CM | POA: Diagnosis not present

## 2011-06-13 DIAGNOSIS — Z125 Encounter for screening for malignant neoplasm of prostate: Secondary | ICD-10-CM | POA: Diagnosis not present

## 2011-06-13 DIAGNOSIS — E039 Hypothyroidism, unspecified: Secondary | ICD-10-CM | POA: Diagnosis not present

## 2011-06-13 DIAGNOSIS — I2510 Atherosclerotic heart disease of native coronary artery without angina pectoris: Secondary | ICD-10-CM | POA: Diagnosis not present

## 2011-06-13 DIAGNOSIS — K565 Intestinal adhesions [bands], unspecified as to partial versus complete obstruction: Secondary | ICD-10-CM | POA: Diagnosis not present

## 2011-06-16 ENCOUNTER — Other Ambulatory Visit: Payer: Self-pay | Admitting: Internal Medicine

## 2011-06-16 ENCOUNTER — Ambulatory Visit (INDEPENDENT_AMBULATORY_CARE_PROVIDER_SITE_OTHER): Payer: Medicare Other

## 2011-06-16 DIAGNOSIS — J309 Allergic rhinitis, unspecified: Secondary | ICD-10-CM | POA: Diagnosis not present

## 2011-06-16 DIAGNOSIS — E039 Hypothyroidism, unspecified: Secondary | ICD-10-CM

## 2011-06-17 ENCOUNTER — Telehealth: Payer: Self-pay | Admitting: Cardiology

## 2011-06-17 NOTE — Telephone Encounter (Signed)
LOV,12,Echo faxed to [facility] @ [phone] 06/17/11/KM

## 2011-06-19 ENCOUNTER — Ambulatory Visit
Admission: RE | Admit: 2011-06-19 | Discharge: 2011-06-19 | Disposition: A | Discharge status: Home / Self Care | Payer: Medicare Other | Source: Ambulatory Visit | Attending: Internal Medicine | Admitting: Internal Medicine

## 2011-06-19 DIAGNOSIS — E039 Hypothyroidism, unspecified: Secondary | ICD-10-CM

## 2011-06-19 DIAGNOSIS — E041 Nontoxic single thyroid nodule: Secondary | ICD-10-CM | POA: Diagnosis not present

## 2011-06-23 ENCOUNTER — Ambulatory Visit (INDEPENDENT_AMBULATORY_CARE_PROVIDER_SITE_OTHER): Payer: Medicare Other

## 2011-06-23 DIAGNOSIS — J309 Allergic rhinitis, unspecified: Secondary | ICD-10-CM

## 2011-06-25 ENCOUNTER — Encounter: Payer: Self-pay | Admitting: Internal Medicine

## 2011-06-30 ENCOUNTER — Ambulatory Visit (INDEPENDENT_AMBULATORY_CARE_PROVIDER_SITE_OTHER): Payer: Medicare Other

## 2011-06-30 DIAGNOSIS — J309 Allergic rhinitis, unspecified: Secondary | ICD-10-CM

## 2011-07-08 ENCOUNTER — Ambulatory Visit (INDEPENDENT_AMBULATORY_CARE_PROVIDER_SITE_OTHER): Payer: Medicare Other

## 2011-07-08 DIAGNOSIS — J309 Allergic rhinitis, unspecified: Secondary | ICD-10-CM

## 2011-07-16 ENCOUNTER — Ambulatory Visit (INDEPENDENT_AMBULATORY_CARE_PROVIDER_SITE_OTHER): Payer: Medicare Other

## 2011-07-16 DIAGNOSIS — J309 Allergic rhinitis, unspecified: Secondary | ICD-10-CM | POA: Diagnosis not present

## 2011-07-23 ENCOUNTER — Ambulatory Visit (INDEPENDENT_AMBULATORY_CARE_PROVIDER_SITE_OTHER): Payer: Medicare Other

## 2011-07-23 DIAGNOSIS — J309 Allergic rhinitis, unspecified: Secondary | ICD-10-CM | POA: Diagnosis not present

## 2011-07-29 ENCOUNTER — Ambulatory Visit (INDEPENDENT_AMBULATORY_CARE_PROVIDER_SITE_OTHER): Payer: Medicare Other

## 2011-07-29 DIAGNOSIS — J309 Allergic rhinitis, unspecified: Secondary | ICD-10-CM | POA: Diagnosis not present

## 2011-08-04 ENCOUNTER — Ambulatory Visit (INDEPENDENT_AMBULATORY_CARE_PROVIDER_SITE_OTHER): Payer: Medicare Other

## 2011-08-04 DIAGNOSIS — J309 Allergic rhinitis, unspecified: Secondary | ICD-10-CM

## 2011-08-11 ENCOUNTER — Ambulatory Visit (INDEPENDENT_AMBULATORY_CARE_PROVIDER_SITE_OTHER): Payer: Medicare Other

## 2011-08-11 DIAGNOSIS — J309 Allergic rhinitis, unspecified: Secondary | ICD-10-CM

## 2011-08-12 ENCOUNTER — Ambulatory Visit (INDEPENDENT_AMBULATORY_CARE_PROVIDER_SITE_OTHER): Payer: Medicare Other

## 2011-08-12 DIAGNOSIS — J309 Allergic rhinitis, unspecified: Secondary | ICD-10-CM

## 2011-08-18 ENCOUNTER — Ambulatory Visit (INDEPENDENT_AMBULATORY_CARE_PROVIDER_SITE_OTHER): Payer: Medicare Other

## 2011-08-18 DIAGNOSIS — J309 Allergic rhinitis, unspecified: Secondary | ICD-10-CM | POA: Diagnosis not present

## 2011-08-26 ENCOUNTER — Ambulatory Visit (INDEPENDENT_AMBULATORY_CARE_PROVIDER_SITE_OTHER): Payer: Medicare Other

## 2011-08-26 DIAGNOSIS — J309 Allergic rhinitis, unspecified: Secondary | ICD-10-CM | POA: Diagnosis not present

## 2011-09-01 ENCOUNTER — Ambulatory Visit (INDEPENDENT_AMBULATORY_CARE_PROVIDER_SITE_OTHER): Payer: Medicare Other

## 2011-09-01 DIAGNOSIS — J309 Allergic rhinitis, unspecified: Secondary | ICD-10-CM | POA: Diagnosis not present

## 2011-09-08 ENCOUNTER — Ambulatory Visit (INDEPENDENT_AMBULATORY_CARE_PROVIDER_SITE_OTHER): Payer: Medicare Other

## 2011-09-08 DIAGNOSIS — J309 Allergic rhinitis, unspecified: Secondary | ICD-10-CM

## 2011-09-16 ENCOUNTER — Ambulatory Visit (INDEPENDENT_AMBULATORY_CARE_PROVIDER_SITE_OTHER): Payer: Medicare Other

## 2011-09-16 DIAGNOSIS — J309 Allergic rhinitis, unspecified: Secondary | ICD-10-CM | POA: Diagnosis not present

## 2011-09-19 DIAGNOSIS — J069 Acute upper respiratory infection, unspecified: Secondary | ICD-10-CM | POA: Diagnosis not present

## 2011-09-19 DIAGNOSIS — R059 Cough, unspecified: Secondary | ICD-10-CM | POA: Diagnosis not present

## 2011-09-19 DIAGNOSIS — R062 Wheezing: Secondary | ICD-10-CM | POA: Diagnosis not present

## 2011-09-19 DIAGNOSIS — R5381 Other malaise: Secondary | ICD-10-CM | POA: Diagnosis not present

## 2011-09-19 DIAGNOSIS — R05 Cough: Secondary | ICD-10-CM | POA: Diagnosis not present

## 2011-09-24 ENCOUNTER — Ambulatory Visit (INDEPENDENT_AMBULATORY_CARE_PROVIDER_SITE_OTHER): Payer: Medicare Other

## 2011-09-24 DIAGNOSIS — J309 Allergic rhinitis, unspecified: Secondary | ICD-10-CM | POA: Diagnosis not present

## 2011-09-26 ENCOUNTER — Encounter: Payer: Self-pay | Admitting: Internal Medicine

## 2011-09-29 ENCOUNTER — Ambulatory Visit (INDEPENDENT_AMBULATORY_CARE_PROVIDER_SITE_OTHER): Payer: Medicare Other

## 2011-09-29 DIAGNOSIS — J309 Allergic rhinitis, unspecified: Secondary | ICD-10-CM

## 2011-10-06 ENCOUNTER — Ambulatory Visit (INDEPENDENT_AMBULATORY_CARE_PROVIDER_SITE_OTHER): Payer: Medicare Other

## 2011-10-06 DIAGNOSIS — J309 Allergic rhinitis, unspecified: Secondary | ICD-10-CM | POA: Diagnosis not present

## 2011-10-20 ENCOUNTER — Ambulatory Visit (INDEPENDENT_AMBULATORY_CARE_PROVIDER_SITE_OTHER): Payer: Medicare Other

## 2011-10-20 DIAGNOSIS — J309 Allergic rhinitis, unspecified: Secondary | ICD-10-CM

## 2011-10-27 ENCOUNTER — Ambulatory Visit (INDEPENDENT_AMBULATORY_CARE_PROVIDER_SITE_OTHER): Payer: Medicare Other

## 2011-10-27 DIAGNOSIS — J309 Allergic rhinitis, unspecified: Secondary | ICD-10-CM

## 2011-11-03 ENCOUNTER — Ambulatory Visit (INDEPENDENT_AMBULATORY_CARE_PROVIDER_SITE_OTHER): Payer: Medicare Other

## 2011-11-03 DIAGNOSIS — J309 Allergic rhinitis, unspecified: Secondary | ICD-10-CM | POA: Diagnosis not present

## 2011-11-10 ENCOUNTER — Ambulatory Visit (INDEPENDENT_AMBULATORY_CARE_PROVIDER_SITE_OTHER): Payer: Medicare Other

## 2011-11-10 DIAGNOSIS — J309 Allergic rhinitis, unspecified: Secondary | ICD-10-CM | POA: Diagnosis not present

## 2011-11-18 ENCOUNTER — Ambulatory Visit (INDEPENDENT_AMBULATORY_CARE_PROVIDER_SITE_OTHER): Payer: Medicare Other

## 2011-11-18 DIAGNOSIS — J309 Allergic rhinitis, unspecified: Secondary | ICD-10-CM

## 2011-11-24 ENCOUNTER — Ambulatory Visit (INDEPENDENT_AMBULATORY_CARE_PROVIDER_SITE_OTHER): Payer: Medicare Other

## 2011-11-24 DIAGNOSIS — J309 Allergic rhinitis, unspecified: Secondary | ICD-10-CM

## 2011-12-01 ENCOUNTER — Ambulatory Visit (INDEPENDENT_AMBULATORY_CARE_PROVIDER_SITE_OTHER): Payer: Medicare Other

## 2011-12-01 DIAGNOSIS — J309 Allergic rhinitis, unspecified: Secondary | ICD-10-CM

## 2011-12-08 ENCOUNTER — Telehealth: Payer: Self-pay | Admitting: Internal Medicine

## 2011-12-08 ENCOUNTER — Ambulatory Visit (INDEPENDENT_AMBULATORY_CARE_PROVIDER_SITE_OTHER): Payer: Medicare Other

## 2011-12-08 DIAGNOSIS — J309 Allergic rhinitis, unspecified: Secondary | ICD-10-CM

## 2011-12-08 NOTE — Telephone Encounter (Signed)
lmomtcb x1 for pt 

## 2011-12-09 NOTE — Telephone Encounter (Signed)
I spoke with pt and he is scheduled to come in and see TP on friday 12/12/11 at 9:00. Pt aware to seek emergency care if he worsens. Nothing further was needed

## 2011-12-09 NOTE — Telephone Encounter (Signed)
I spoke with pt and he c/o chest congestion, dry cough, increase SOB x 2-3 weeks getting worse now. He is requesting to be seen this week. He wants to come in on Thursday after 3:00 or on Friday before 10:30 or after 2:00. Please advise Dr. Maple Hudson thanks

## 2011-12-09 NOTE — Telephone Encounter (Signed)
Per CY-okay to have patient come in and see him if slot open otherwise patient can be worked in on TP schedule .

## 2011-12-12 ENCOUNTER — Telehealth: Payer: Self-pay | Admitting: Internal Medicine

## 2011-12-12 ENCOUNTER — Encounter: Payer: Self-pay | Admitting: Adult Health

## 2011-12-12 ENCOUNTER — Ambulatory Visit (INDEPENDENT_AMBULATORY_CARE_PROVIDER_SITE_OTHER): Payer: Medicare Other | Admitting: Adult Health

## 2011-12-12 ENCOUNTER — Ambulatory Visit (INDEPENDENT_AMBULATORY_CARE_PROVIDER_SITE_OTHER)
Admission: RE | Admit: 2011-12-12 | Discharge: 2011-12-12 | Disposition: A | Discharge status: Home / Self Care | Payer: Medicare Other | Source: Ambulatory Visit | Attending: Adult Health | Admitting: Adult Health

## 2011-12-12 VITALS — BP 134/76 | HR 83 | Temp 97.4°F | Ht 70.0 in | Wt 255.0 lb

## 2011-12-12 DIAGNOSIS — J449 Chronic obstructive pulmonary disease, unspecified: Secondary | ICD-10-CM

## 2011-12-12 DIAGNOSIS — J4489 Other specified chronic obstructive pulmonary disease: Secondary | ICD-10-CM

## 2011-12-12 MED ORDER — VALSARTAN 160 MG PO TABS: 160.0000 mg | ORAL_TABLET | Freq: Every day | ORAL | Status: DC

## 2011-12-12 NOTE — Progress Notes (Signed)
  Subjective:    Patient ID: [name], male    DOB: [birth year], 71 y.o.   MRN: [id number]  HPI 09/30/10- 37 yoM former smoker followed for asthma/ COPD, allergic rhinitis Last here- February 21, 2010.Note reviewed. Since last here had left [facility] in February with no respiratory problems.  He is pleased with his allergy vaccine at 1:10, getting injections here. Still has his pets. Otc allegra has been occasional supplement.   12/12/2011 Acute OV  Complains of wheezing, cough rarely producing white mucus, decreased energy, runny nose x67months, worsening throughout the summer. Has a daily dry cough that will not go away  Hears a wheeze in the throat when he coughs.  No significant Dyspnea but has decreased activity tolerance.  No chest pain , edema or fever.  No discolored mucus.  No OTC used.    Review of Systems Constitutional:   No weight loss, night sweats,  Fevers, chills, + fatigue, lassitude. HEENT:   No headaches,  Difficulty swallowing,  Tooth/dental problems,  Sore throat,                No sneezing, itching, ear ache,  +nasal congestion, post nasal drip,   CV:  No chest pain,  Orthopnea, PND, swelling in lower extremities, anasarca, dizziness, palpitations  GI  No heartburn, indigestion, abdominal pain, nausea, vomiting, diarrhea, change in bowel habits, loss of appetite  Resp:    No coughing up of blood.  No change in color of mucus.    Skin: no rash or lesions.  GU: no dysuria, change in color of urine, no urgency or frequency.  No flank pain.  MS:  No joint pain or swelling.  No decreased range of motion.  No back pain.  Psych:  No change in mood or affect. No depression or anxiety.  No memory loss.      Objective:   Physical Exam GEN: A/Ox3; pleasant , NAD, well nourished   HEENT:  [place]/AT,  EACs-clear, TMs-wnl, NOSE-clear drainage  THROAT-clear, no lesions, no postnasal drip or exudate noted.   NECK:  Supple w/ fair ROM; no JVD; normal carotid impulses w/o  bruits; no thyromegaly or nodules palpated; no lymphadenopathy.  RESP  Coarse BS w/ upper airway psuedowheezing  No rales/ or rhonchi.no accessory muscle use, no dullness to percussion  CARD:  RRR, no m/r/g  , no peripheral edema, pulses intact, no cyanosis or clubbing.  GI:   Soft & nt; nml bowel sounds; no organomegaly or masses detected.  Musco: Warm bil, no deformities or joint swelling noted.   Neuro: alert, no focal deficits noted.    Skin: Warm, no lesions or rashes        Assessment & Plan:

## 2011-12-12 NOTE — Patient Instructions (Addendum)
Stop Altace  Begin Diovan 160mg  daily  Follow up with primary doctor in 4 weeks to evaluate blood pressure on new med I will call with xray results.  Delsym 2 tsp Twice daily  As needed  Cough  Avoid mint products.  Try not to clear throat.  follow up Dr. Maple Hudson  In 6 weeks  Please contact office for sooner follow up if symptoms do not improve or worsen or seek emergency care

## 2011-12-12 NOTE — Assessment & Plan Note (Signed)
?  COPD flare vs ACE related cough  Will check xray  Change ACE to ARB  If not improving on return consider adding Spiriva  Cont on AR/GERD prevention regimen with allegra/prilosec   Plan;  Stop Altace  Begin Diovan 160mg  daily  Follow up with primary doctor in 4 weeks to evaluate blood pressure on new med I will call with xray results.  Delsym 2 tsp Twice daily  As needed  Cough  Avoid mint products.  Try not to clear throat.  follow up Dr. Maple Hudson  In 6 weeks  Please contact office for sooner follow up if symptoms do not improve or worsen or seek emergency care

## 2011-12-12 NOTE — Telephone Encounter (Signed)
Error.[name] ° °

## 2011-12-16 ENCOUNTER — Ambulatory Visit (INDEPENDENT_AMBULATORY_CARE_PROVIDER_SITE_OTHER): Payer: Medicare Other

## 2011-12-16 DIAGNOSIS — J309 Allergic rhinitis, unspecified: Secondary | ICD-10-CM | POA: Diagnosis not present

## 2011-12-22 DIAGNOSIS — M542 Cervicalgia: Secondary | ICD-10-CM | POA: Diagnosis not present

## 2011-12-22 DIAGNOSIS — K219 Gastro-esophageal reflux disease without esophagitis: Secondary | ICD-10-CM | POA: Diagnosis not present

## 2011-12-22 DIAGNOSIS — R059 Cough, unspecified: Secondary | ICD-10-CM | POA: Diagnosis not present

## 2011-12-22 DIAGNOSIS — R05 Cough: Secondary | ICD-10-CM | POA: Diagnosis not present

## 2011-12-22 DIAGNOSIS — R209 Unspecified disturbances of skin sensation: Secondary | ICD-10-CM | POA: Diagnosis not present

## 2011-12-23 ENCOUNTER — Ambulatory Visit (INDEPENDENT_AMBULATORY_CARE_PROVIDER_SITE_OTHER): Payer: Medicare Other

## 2011-12-23 ENCOUNTER — Telehealth: Payer: Self-pay | Admitting: Internal Medicine

## 2011-12-23 DIAGNOSIS — J309 Allergic rhinitis, unspecified: Secondary | ICD-10-CM | POA: Diagnosis not present

## 2011-12-23 NOTE — Telephone Encounter (Signed)
Called spoke with patient who stated that Dr Renne Crigler is requesting the results from his 12-12-11 cxr.  Advised will fax report > done via biscom.  Pt is aware.  Nothing further is needed; will sign off.

## 2011-12-24 ENCOUNTER — Ambulatory Visit (INDEPENDENT_AMBULATORY_CARE_PROVIDER_SITE_OTHER): Payer: Medicare Other

## 2011-12-24 DIAGNOSIS — J309 Allergic rhinitis, unspecified: Secondary | ICD-10-CM

## 2011-12-30 ENCOUNTER — Ambulatory Visit (INDEPENDENT_AMBULATORY_CARE_PROVIDER_SITE_OTHER): Payer: Medicare Other

## 2011-12-30 DIAGNOSIS — J309 Allergic rhinitis, unspecified: Secondary | ICD-10-CM | POA: Diagnosis not present

## 2011-12-30 DIAGNOSIS — M47812 Spondylosis without myelopathy or radiculopathy, cervical region: Secondary | ICD-10-CM | POA: Diagnosis not present

## 2011-12-30 DIAGNOSIS — M542 Cervicalgia: Secondary | ICD-10-CM | POA: Diagnosis not present

## 2012-01-02 ENCOUNTER — Encounter: Payer: Self-pay | Admitting: Internal Medicine

## 2012-01-02 ENCOUNTER — Ambulatory Visit (INDEPENDENT_AMBULATORY_CARE_PROVIDER_SITE_OTHER): Payer: Medicare Other | Admitting: Internal Medicine

## 2012-01-02 VITALS — BP 126/70 | HR 95 | Ht 70.0 in | Wt 261.4 lb

## 2012-01-02 DIAGNOSIS — Z23 Encounter for immunization: Secondary | ICD-10-CM | POA: Diagnosis not present

## 2012-01-02 DIAGNOSIS — J45998 Other asthma: Secondary | ICD-10-CM

## 2012-01-02 DIAGNOSIS — J449 Chronic obstructive pulmonary disease, unspecified: Secondary | ICD-10-CM | POA: Diagnosis not present

## 2012-01-02 DIAGNOSIS — J45909 Unspecified asthma, uncomplicated: Secondary | ICD-10-CM

## 2012-01-02 MED ORDER — MOMETASONE FURO-FORMOTEROL FUM 100-5 MCG/ACT IN AERO: 2.0000 | INHALATION_SPRAY | Freq: Two times a day (BID) | RESPIRATORY_TRACT | Status: DC

## 2012-01-02 NOTE — Progress Notes (Signed)
Subjective:    Patient ID: [name], male    DOB: [birth year], 71 y.o.   MRN: [id number]  HPI 09/30/10- 59 yoM former smoker followed for asthma/ COPD, allergic rhinitis Last here- February 21, 2010.Note reviewed. Since last here had left [facility] in February with no respiratory problems.  He is pleased with his allergy vaccine at 1:10, getting injections here. Still has his pets. Otc allegra has been occasional supplement.   12/12/2011 Acute OV  Complains of wheezing, cough rarely producing white mucus, decreased energy, runny nose x64months, worsening throughout the summer. Has a daily dry cough that will not go away  Hears a wheeze in the throat when he coughs.  No significant Dyspnea but has decreased activity tolerance.  No chest pain , edema or fever.  No discolored mucus.  No OTC used.   01/02/12-  70 yoM former smoker followed for asthma/ COPD, allergic rhinitis ACUTE VISIT:Increased congestion in chest and wheezing, also would like to discuss when to take Allegra. Still wheezing in the mornings some, after seeing nurse practitioner August 16. No infection. He thinks he is more easily short of breath with exertion but admits he is walking less regularly and may have gained some weight. He wants to blame increased cavities and dental problems on his inhalers. We discussed dry mouth. He has started using biotene some. Uses albuterol rescue inhaler infrequently. Continues allergy vaccine 1:10 GH and denies sneezing, nasal congestion or rhinorrhea. Office spirometry- 01/02/12- suboptimal effort ACE based on shape of curve: Mild obstructive airways disease. FVC 3.66/82%, FEV1 2.43/71%, FEV1/FEC 0.66/87%, FEF 25-75% 1.33/44% predicted. CXR 12/12/11-  IMPRESSION:  No active cardiopulmonary disease.  Original Report Authenticated By: Danae Orleans, M.D.    ROS-see HPI Constitutional:   No-   weight loss, night sweats, fevers, chills, fatigue, lassitude. HEENT:   No-  headaches, difficulty  swallowing, tooth/dental problems, sore throat,       No-  sneezing, itching, ear ache, nasal congestion, post nasal drip,  CV:  No-   chest pain, orthopnea, PND, swelling in lower extremities, anasarca, dizziness, palpitations Resp: +shortness of breath with exertion or at rest.              No-   productive cough,  No non-productive cough,  No- coughing up of blood.              No-   change in color of mucus.  + wheezing.   Skin: No-   rash or lesions. GI:  No-   heartburn, indigestion, abdominal pain, nausea, vomiting,  GU: . MS:  No-   joint pain or swelling.   Neuro-     nothing unusual Psych:  No- change in mood or affect. No depression or anxiety.  No memory loss.  Objective:  OBJ- Physical Exam General- Alert, Oriented, Affect-appropriate, Distress- none acute Skin- rash-none, lesions- none, excoriation- none Lymphadenopathy- none Head- atraumatic            Eyes- Gross vision intact, PERRLA, conjunctivae and secretions clear            Ears- Hearing, canals-normal            Nose- Clear, no-Septal dev, mucus, polyps, erosion, perforation             Throat- Mallampati II , mucosa clear , drainage- none, tonsils- atrophic Neck- flexible , trachea midline, no stridor , thyroid nl, carotid no bruit Chest - symmetrical excursion , unlabored  Heart/CV- RRR , no murmur , no gallop  , no rub, nl s1 s2                           - JVD- none , edema- none, stasis changes- none, varices- none           Lung- + few crackles, unlabored, wheeze- none, cough- none , dullness-none, rub- none           Chest wall-  Abd-  Br/ Gen/ Rectal- Not done, not indicated Extrem- cyanosis- none, clubbing, none, atrophy- none, strength- nl Neuro- grossly intact to observation Assessment & Plan:

## 2012-01-02 NOTE — Patient Instructions (Addendum)
Office spirometry- done  Dx COPD- done  Sample and script Dulera 100   2 puffs then rinse twice daily   Flu vax

## 2012-01-06 ENCOUNTER — Ambulatory Visit (INDEPENDENT_AMBULATORY_CARE_PROVIDER_SITE_OTHER): Payer: Medicare Other

## 2012-01-06 DIAGNOSIS — J309 Allergic rhinitis, unspecified: Secondary | ICD-10-CM | POA: Diagnosis not present

## 2012-01-08 DIAGNOSIS — Q7649 Other congenital malformations of spine, not associated with scoliosis: Secondary | ICD-10-CM | POA: Diagnosis not present

## 2012-01-08 DIAGNOSIS — M542 Cervicalgia: Secondary | ICD-10-CM | POA: Diagnosis not present

## 2012-01-09 ENCOUNTER — Other Ambulatory Visit: Payer: Self-pay | Admitting: Internal Medicine

## 2012-01-09 ENCOUNTER — Telehealth: Payer: Self-pay | Admitting: Internal Medicine

## 2012-01-09 MED ORDER — MOMETASONE FURO-FORMOTEROL FUM 100-5 MCG/ACT IN AERO: 2.0000 | INHALATION_SPRAY | Freq: Two times a day (BID) | RESPIRATORY_TRACT | Status: DC

## 2012-01-09 NOTE — Telephone Encounter (Signed)
Called, spoke with pt who was confused if he was to cont or stop the dulera because all he received during OV was sample.  Looks like dulera rx was printed, Reports he did not receive the rx.  I apologized to pt for not receiving this and advised he should cont with the dulera and would send rx to [facility].  Pt verbalized understanding of this and states wheezing has stopped now that on dulera.  He is aware to call back if he has any problems while on dulera or if breathing worsens or does not improve.  He verbalized understanding of instructions.

## 2012-01-11 NOTE — Assessment & Plan Note (Addendum)
Only mild obstructive disease apparent on office spirometry Plan-add maintenance inhaler Dulera. Okay to continue biking. Emphasized mouth care. Flu vaccine

## 2012-01-13 ENCOUNTER — Encounter: Payer: Self-pay | Admitting: Internal Medicine

## 2012-01-15 DIAGNOSIS — M542 Cervicalgia: Secondary | ICD-10-CM | POA: Diagnosis not present

## 2012-01-15 DIAGNOSIS — Q7649 Other congenital malformations of spine, not associated with scoliosis: Secondary | ICD-10-CM | POA: Diagnosis not present

## 2012-01-20 ENCOUNTER — Ambulatory Visit (INDEPENDENT_AMBULATORY_CARE_PROVIDER_SITE_OTHER): Payer: Medicare Other

## 2012-01-20 DIAGNOSIS — Q7649 Other congenital malformations of spine, not associated with scoliosis: Secondary | ICD-10-CM | POA: Diagnosis not present

## 2012-01-20 DIAGNOSIS — M542 Cervicalgia: Secondary | ICD-10-CM | POA: Diagnosis not present

## 2012-01-20 DIAGNOSIS — J309 Allergic rhinitis, unspecified: Secondary | ICD-10-CM

## 2012-01-21 DIAGNOSIS — Q7649 Other congenital malformations of spine, not associated with scoliosis: Secondary | ICD-10-CM | POA: Diagnosis not present

## 2012-01-21 DIAGNOSIS — M542 Cervicalgia: Secondary | ICD-10-CM | POA: Diagnosis not present

## 2012-01-22 DIAGNOSIS — R030 Elevated blood-pressure reading, without diagnosis of hypertension: Secondary | ICD-10-CM | POA: Diagnosis not present

## 2012-01-22 DIAGNOSIS — R252 Cramp and spasm: Secondary | ICD-10-CM | POA: Diagnosis not present

## 2012-01-22 DIAGNOSIS — Q7649 Other congenital malformations of spine, not associated with scoliosis: Secondary | ICD-10-CM | POA: Diagnosis not present

## 2012-01-22 DIAGNOSIS — E039 Hypothyroidism, unspecified: Secondary | ICD-10-CM | POA: Diagnosis not present

## 2012-01-22 DIAGNOSIS — M542 Cervicalgia: Secondary | ICD-10-CM | POA: Diagnosis not present

## 2012-01-26 DIAGNOSIS — Q7649 Other congenital malformations of spine, not associated with scoliosis: Secondary | ICD-10-CM | POA: Diagnosis not present

## 2012-01-26 DIAGNOSIS — M542 Cervicalgia: Secondary | ICD-10-CM | POA: Diagnosis not present

## 2012-01-27 ENCOUNTER — Ambulatory Visit (INDEPENDENT_AMBULATORY_CARE_PROVIDER_SITE_OTHER): Payer: Medicare Other

## 2012-01-27 DIAGNOSIS — J309 Allergic rhinitis, unspecified: Secondary | ICD-10-CM | POA: Diagnosis not present

## 2012-01-28 DIAGNOSIS — M542 Cervicalgia: Secondary | ICD-10-CM | POA: Diagnosis not present

## 2012-01-28 DIAGNOSIS — Q7649 Other congenital malformations of spine, not associated with scoliosis: Secondary | ICD-10-CM | POA: Diagnosis not present

## 2012-01-29 DIAGNOSIS — M542 Cervicalgia: Secondary | ICD-10-CM | POA: Diagnosis not present

## 2012-01-29 DIAGNOSIS — Q7649 Other congenital malformations of spine, not associated with scoliosis: Secondary | ICD-10-CM | POA: Diagnosis not present

## 2012-02-02 DIAGNOSIS — M542 Cervicalgia: Secondary | ICD-10-CM | POA: Diagnosis not present

## 2012-02-02 DIAGNOSIS — Q7649 Other congenital malformations of spine, not associated with scoliosis: Secondary | ICD-10-CM | POA: Diagnosis not present

## 2012-02-03 ENCOUNTER — Ambulatory Visit (INDEPENDENT_AMBULATORY_CARE_PROVIDER_SITE_OTHER): Payer: Medicare Other

## 2012-02-03 DIAGNOSIS — J309 Allergic rhinitis, unspecified: Secondary | ICD-10-CM | POA: Diagnosis not present

## 2012-02-04 DIAGNOSIS — M542 Cervicalgia: Secondary | ICD-10-CM | POA: Diagnosis not present

## 2012-02-04 DIAGNOSIS — Q7649 Other congenital malformations of spine, not associated with scoliosis: Secondary | ICD-10-CM | POA: Diagnosis not present

## 2012-02-05 DIAGNOSIS — Q7649 Other congenital malformations of spine, not associated with scoliosis: Secondary | ICD-10-CM | POA: Diagnosis not present

## 2012-02-05 DIAGNOSIS — M542 Cervicalgia: Secondary | ICD-10-CM | POA: Diagnosis not present

## 2012-02-06 DIAGNOSIS — M47812 Spondylosis without myelopathy or radiculopathy, cervical region: Secondary | ICD-10-CM | POA: Diagnosis not present

## 2012-02-06 DIAGNOSIS — M542 Cervicalgia: Secondary | ICD-10-CM | POA: Diagnosis not present

## 2012-02-09 DIAGNOSIS — M542 Cervicalgia: Secondary | ICD-10-CM | POA: Diagnosis not present

## 2012-02-09 DIAGNOSIS — Q7649 Other congenital malformations of spine, not associated with scoliosis: Secondary | ICD-10-CM | POA: Diagnosis not present

## 2012-02-10 ENCOUNTER — Ambulatory Visit (INDEPENDENT_AMBULATORY_CARE_PROVIDER_SITE_OTHER): Payer: Medicare Other

## 2012-02-10 DIAGNOSIS — J309 Allergic rhinitis, unspecified: Secondary | ICD-10-CM | POA: Diagnosis not present

## 2012-02-11 DIAGNOSIS — Q7649 Other congenital malformations of spine, not associated with scoliosis: Secondary | ICD-10-CM | POA: Diagnosis not present

## 2012-02-11 DIAGNOSIS — M542 Cervicalgia: Secondary | ICD-10-CM | POA: Diagnosis not present

## 2012-02-12 DIAGNOSIS — M542 Cervicalgia: Secondary | ICD-10-CM | POA: Diagnosis not present

## 2012-02-13 DIAGNOSIS — M47812 Spondylosis without myelopathy or radiculopathy, cervical region: Secondary | ICD-10-CM | POA: Diagnosis not present

## 2012-02-16 DIAGNOSIS — M542 Cervicalgia: Secondary | ICD-10-CM | POA: Diagnosis not present

## 2012-02-16 DIAGNOSIS — Q7649 Other congenital malformations of spine, not associated with scoliosis: Secondary | ICD-10-CM | POA: Diagnosis not present

## 2012-02-17 ENCOUNTER — Ambulatory Visit (INDEPENDENT_AMBULATORY_CARE_PROVIDER_SITE_OTHER): Payer: Medicare Other

## 2012-02-17 DIAGNOSIS — J309 Allergic rhinitis, unspecified: Secondary | ICD-10-CM

## 2012-02-18 DIAGNOSIS — Q7649 Other congenital malformations of spine, not associated with scoliosis: Secondary | ICD-10-CM | POA: Diagnosis not present

## 2012-02-18 DIAGNOSIS — M542 Cervicalgia: Secondary | ICD-10-CM | POA: Diagnosis not present

## 2012-02-20 DIAGNOSIS — Q7649 Other congenital malformations of spine, not associated with scoliosis: Secondary | ICD-10-CM | POA: Diagnosis not present

## 2012-02-20 DIAGNOSIS — M542 Cervicalgia: Secondary | ICD-10-CM | POA: Diagnosis not present

## 2012-02-24 ENCOUNTER — Ambulatory Visit (INDEPENDENT_AMBULATORY_CARE_PROVIDER_SITE_OTHER): Payer: Medicare Other

## 2012-02-24 DIAGNOSIS — M542 Cervicalgia: Secondary | ICD-10-CM | POA: Diagnosis not present

## 2012-02-24 DIAGNOSIS — J309 Allergic rhinitis, unspecified: Secondary | ICD-10-CM

## 2012-02-24 DIAGNOSIS — M47812 Spondylosis without myelopathy or radiculopathy, cervical region: Secondary | ICD-10-CM | POA: Diagnosis not present

## 2012-02-26 DIAGNOSIS — M542 Cervicalgia: Secondary | ICD-10-CM | POA: Diagnosis not present

## 2012-02-26 DIAGNOSIS — M47812 Spondylosis without myelopathy or radiculopathy, cervical region: Secondary | ICD-10-CM | POA: Diagnosis not present

## 2012-03-02 ENCOUNTER — Ambulatory Visit (INDEPENDENT_AMBULATORY_CARE_PROVIDER_SITE_OTHER): Payer: Medicare Other

## 2012-03-02 DIAGNOSIS — J309 Allergic rhinitis, unspecified: Secondary | ICD-10-CM | POA: Diagnosis not present

## 2012-03-05 DIAGNOSIS — E039 Hypothyroidism, unspecified: Secondary | ICD-10-CM | POA: Diagnosis not present

## 2012-03-09 ENCOUNTER — Ambulatory Visit (INDEPENDENT_AMBULATORY_CARE_PROVIDER_SITE_OTHER): Payer: Medicare Other

## 2012-03-09 DIAGNOSIS — J309 Allergic rhinitis, unspecified: Secondary | ICD-10-CM | POA: Diagnosis not present

## 2012-03-10 DIAGNOSIS — J385 Laryngeal spasm: Secondary | ICD-10-CM | POA: Diagnosis not present

## 2012-03-10 DIAGNOSIS — L408 Other psoriasis: Secondary | ICD-10-CM | POA: Diagnosis not present

## 2012-03-10 DIAGNOSIS — B9789 Other viral agents as the cause of diseases classified elsewhere: Secondary | ICD-10-CM | POA: Diagnosis not present

## 2012-03-16 ENCOUNTER — Ambulatory Visit (INDEPENDENT_AMBULATORY_CARE_PROVIDER_SITE_OTHER): Payer: Medicare Other

## 2012-03-16 ENCOUNTER — Telehealth: Payer: Self-pay | Admitting: Internal Medicine

## 2012-03-16 DIAGNOSIS — J309 Allergic rhinitis, unspecified: Secondary | ICD-10-CM

## 2012-03-16 MED ORDER — AZITHROMYCIN 250 MG PO TABS: ORAL_TABLET | ORAL | Status: DC

## 2012-03-16 NOTE — Telephone Encounter (Signed)
Pt aware of Zpak sent to pharmacy and [facility] 200/5 sample at front to use.

## 2012-03-16 NOTE — Telephone Encounter (Signed)
Called, spoke with pt.  C/o congestion "in upper part of my bronchial tubes" x 2 months.  Also states he does have a prod cough with white mucus, is hoarse, has chest tightness, and is "not able to get as much o2 in."  Denies wheezing or fever at this time.  Taking dulera bid, nasal spray, and fexofenadine.  Pt reports these symptoms are "on the edge of giving me real trouble and quickly goes into bronchitis."  Requesting further recs.  Dr. Maple Hudson, pls advise.  Thank you.  nkda - verified  [facility]  Last OV with Dr. Maple Hudson 01/02/2012

## 2012-03-16 NOTE — Telephone Encounter (Signed)
Suggest we offer to let him pick up a sample of Dulera 200     2 puffs and rinse, twice daily. Use this instead of his Dulera 100.  Also offer script Zpak  See how he feels after these are done.

## 2012-03-17 ENCOUNTER — Telehealth: Payer: Self-pay | Admitting: Internal Medicine

## 2012-03-17 ENCOUNTER — Encounter: Payer: Self-pay | Admitting: Internal Medicine

## 2012-03-17 ENCOUNTER — Ambulatory Visit (INDEPENDENT_AMBULATORY_CARE_PROVIDER_SITE_OTHER): Payer: Medicare Other | Admitting: Internal Medicine

## 2012-03-17 VITALS — BP 122/90 | HR 111 | Temp 97.7°F | Ht 70.0 in | Wt 267.0 lb

## 2012-03-17 DIAGNOSIS — J449 Chronic obstructive pulmonary disease, unspecified: Secondary | ICD-10-CM | POA: Diagnosis not present

## 2012-03-17 MED ORDER — PANTOPRAZOLE SODIUM 40 MG PO TBEC: DELAYED_RELEASE_TABLET | ORAL | Status: AC

## 2012-03-17 MED ORDER — PREDNISONE (PAK) 10 MG PO TABS: ORAL_TABLET | ORAL | Status: DC

## 2012-03-17 NOTE — Patient Instructions (Addendum)
Work on inhaler technique:  relax and gently blow all the way out then take a nice smooth deep breath back in, triggering the inhaler at same time you start breathing in.  Hold for up to 5 seconds if you can.  Rinse and gargle with water when done   If your mouth or throat starts to bother you,   I suggest you time the inhaler to your dental care and after using the inhaler(s) brush teeth and tongue with a baking soda containing toothpaste and when you rinse this out, gargle with it first to see if this helps your mouth and throat.     Add pepcid 20 mg one at bedtime whenever having night time congestion or any respiratory flare in symptoms upper or lower in nature and stop the fish oil  If not better, Prednisone 10 mg take  4 each am x 2 days,   2 each am x 2 days,  1 each am x2days and stop   Call if not improving from prednisone

## 2012-03-17 NOTE — Telephone Encounter (Signed)
Spoke with patient-unsure if ear is full of fluid or if sinus trouble. Would like to be seen by anyone today. Verlon Au asked MW and agrees to see patient. Pt will be here at 11:15am to be seen by MW.

## 2012-03-17 NOTE — Progress Notes (Signed)
  Subjective:    Patient ID: [name], male    DOB: February 28, 1941 .   MRN: [id number]  HPI 09/30/10- 87 yoM former smoker followed for asthma/ COPD, allergic rhinitis .   01/02/12-  70 yoM former smoker followed for asthma/ COPD, allergic rhinitis ACUTE VISIT:Increased congestion in chest and wheezing, also would like to discuss when to take Allegra. Still wheezing in the mornings some, after seeing nurse practitioner August 16. No infection. He thinks he is more easily short of breath with exertion but admits he is walking less regularly and may have gained some weight. He wants to blame increased cavities and dental problems on his inhalers. We discussed dry mouth. He has started using biotene some. Uses albuterol rescue inhaler infrequently. Continues allergy vaccine 1:10 GH and denies sneezing, nasal congestion or rhinorrhea. Office spirometry- 01/02/12- suboptimal effort ACE based on shape of curve: Mild obstructive airways disease. FVC 3.66/82%, FEV1 2.43/71%, FEV1/FEC 0.66/87%, FEF 25-75% 1.33/44% predicted. CXR 12/12/11-  IMPRESSION:  No active cardiopulmonary disease.  Original Report Authenticated By: Danae Orleans, M.D.   03/17/2012 f/u ov/[name] cc loosing ground since Sept 2013 on allergy shots  Weekly symptoms went from bad to worse x one week Lots of chest congestion, coughing with production of white mucus already started  on zpak - maintained on dulera but technique not aoptimal, not using any rescue but sob minimal over baseline.  Cough worse  At hs.  Assoc with nasal congestion not responding to allegra and sense of pnds.  Denies any obvious fluctuation of symptoms with weather or environmental changes or other aggravating or alleviating factors except as outlined above   ROS  The following are not active complaints unless bolded sore throat, dysphagia, dental problems, itching, sneezing,  nasal congestion or excess/ purulent secretions, ear ache,   fever, chills, sweats,  unintended wt loss, pleuritic or exertional cp, hemoptysis,  orthopnea pnd or leg swelling, presyncope, palpitations, heartburn, abdominal pain, anorexia, nausea, vomiting, diarrhea  or change in bowel or urinary habits, change in stools or urine, dysuria,hematuria,  rash, arthralgias, visual complaints, headache, numbness weakness or ataxia or problems with walking or coordination,  change in mood/affect or memory.      Exam amb wm nad  ct, PERRLA, conjunctivae and secretions clear            Ears- Hearing, canals-normal            Nose- Clear, no-Septal dev, mucus, polyps, erosion, perforation             Throat- Mallampati II , mucosa clear , drainage- none, tonsils- atrophic Neck- flexible , trachea midline, no stridor , thyroid nl, carotid no bruit Chest - symmetrical excursion , unlabored           Heart/CV- RRR , no murmur , no gallop  , no rub, nl s1 s2                           - JVD- none , edema- none, stasis changes- none, varices- none           Lung-   Unlabored min bilateral exp rhonchi.           Chest wall-  Abd-  Br/ Gen/ Rectal- Not done, not indicated Extrem- cyanosis- none, clubbing, none, atrophy- none, strength- nl Neuro- grossly intact to observation  cxr 12/12/11 No active cardiopulmonary disease.  Assessment & Plan:

## 2012-03-19 ENCOUNTER — Telehealth: Payer: Self-pay | Admitting: Internal Medicine

## 2012-03-19 MED ORDER — AMOXICILLIN 500 MG PO TABS: 500.0000 mg | ORAL_TABLET | Freq: Three times a day (TID) | ORAL | Status: DC

## 2012-03-19 NOTE — Telephone Encounter (Signed)
Per CY-change Zpak refill to Amoxicillin 500 mg #21 take 1 po tid no refills.

## 2012-03-19 NOTE — Telephone Encounter (Signed)
Spoke with pt and notified of recs per CDY Pt verbalized understanding and states nothing further needed Rx was sent to pharm 

## 2012-03-19 NOTE — Telephone Encounter (Signed)
Pt returned call.  Holly D Pryor ° °

## 2012-03-19 NOTE — Telephone Encounter (Signed)
Called and spoke with pt and he stated that at his last ov he had some ear pain and was given zpak and noticed that this helped and the pain in his ear went away.  Now that he has completed the zpak, the pain in his right ear has started to come back.  Pt is concerned if this could be an infection.  Pt is requesting recs from CY.    Last ov--03/17/2012 with MW Next ov with CY--07/01/2012  CY please advise.  Pt was given pred taper on 03/17/2012/  No Known Allergies

## 2012-03-19 NOTE — Telephone Encounter (Signed)
Calling again in ref to previous msg can be reached at [phone].[name]

## 2012-03-19 NOTE — Telephone Encounter (Signed)
Called spoke with patient to advise of CY's recs.  Pt stated that he is not yet done with the zpak (will take the last tablet tomorrow) and is wondering if this needs to be repeated as opposed to changing the abx therapy.  Reports the ear pain "receded but never completely went away and strengthened again this morning."  Head congestion is resolved, still having some PND.  Overall is "largely improved."  Dr Maple Hudson please advise, thanks.

## 2012-03-19 NOTE — Telephone Encounter (Signed)
Per CY-suggest repeat Zpak #1 take as directed no refills then see what happens.

## 2012-03-19 NOTE — Telephone Encounter (Signed)
[email or web address]  

## 2012-03-21 NOTE — Assessment & Plan Note (Signed)
He has marginal chronic control of multiple non-specific pulmonary complaints much worse with apparent uri x one week  The proper method of use, as well as anticipated side effects, of a metered-dose inhaler are discussed and demonstrated to the patient. Improved effectiveness after extensive coaching during this visit to a level of approximately  75% so ok to continue dulera for the asthmatic component  Explained natural history of uri and why it's necessary in patients at risk to treat GERD aggressively  at least  short term   to reduce risk of evolving cyclical cough initially  triggered by epithelial injury and a heightened sensitivty to the effects of any upper airway irritants,  most importantly acid - related.  That is, the more sensitive the epithelium damaged for virus, the more the cough, the more the secondary reflux (especially in those prone to reflux) the more the irritation of the sensitive mucosa and so on in a cyclical pattern.  Will add pepcid at hs since that's when the cough is worse.

## 2012-03-22 ENCOUNTER — Telehealth: Payer: Self-pay | Admitting: Internal Medicine

## 2012-03-22 NOTE — Telephone Encounter (Signed)
Called, spoke with pt.  Informed him of below per Dr. Maple Hudson and asked he pls call back if symptoms do not improve or worsen.  He verbalized understanding.

## 2012-03-22 NOTE — Telephone Encounter (Signed)
Called, spoke with pt.  States the zpak worked well for the congestion in chest but now "feels like there is water in my ear."  Pt states this is in his left ear and has been going on x 4 days.  Also, reports pressure in left ear, minor ringing in this ear, and hearing is "muffled."  Requesting further recs -- Dr. Maple Hudson, pls advise.  Thank you.  Last OV with CDY 03/17/12  [facility]  nkda - verified

## 2012-03-22 NOTE — Telephone Encounter (Signed)
Per CY - best is Sudafed PE OTC decongestant.

## 2012-03-23 ENCOUNTER — Encounter: Payer: Self-pay | Admitting: Internal Medicine

## 2012-03-23 ENCOUNTER — Ambulatory Visit (INDEPENDENT_AMBULATORY_CARE_PROVIDER_SITE_OTHER): Payer: Medicare Other | Admitting: Internal Medicine

## 2012-03-23 ENCOUNTER — Ambulatory Visit (INDEPENDENT_AMBULATORY_CARE_PROVIDER_SITE_OTHER): Payer: Medicare Other

## 2012-03-23 VITALS — BP 128/62 | HR 82 | Ht 70.5 in | Wt 268.0 lb

## 2012-03-23 DIAGNOSIS — M47812 Spondylosis without myelopathy or radiculopathy, cervical region: Secondary | ICD-10-CM | POA: Diagnosis not present

## 2012-03-23 DIAGNOSIS — H6980 Other specified disorders of Eustachian tube, unspecified ear: Secondary | ICD-10-CM

## 2012-03-23 DIAGNOSIS — M542 Cervicalgia: Secondary | ICD-10-CM | POA: Diagnosis not present

## 2012-03-23 DIAGNOSIS — J309 Allergic rhinitis, unspecified: Secondary | ICD-10-CM

## 2012-03-23 MED ORDER — METHYLPREDNISOLONE ACETATE 80 MG/ML IJ SUSP
80.0000 mg | Freq: Once | INTRAMUSCULAR | Status: AC
  Administered 2012-03-23: 80 mg via INTRAMUSCULAR

## 2012-03-23 MED ORDER — PHENYLEPHRINE HCL 1 % NA SOLN
3.0000 [drp] | Freq: Once | NASAL | Status: AC
  Administered 2012-03-23: 3 [drp] via NASAL

## 2012-03-23 NOTE — Patient Instructions (Addendum)
Neb neo nasal   Depo 80  INB will refer to ENT for eustachian dysfunction

## 2012-03-23 NOTE — Progress Notes (Signed)
  Subjective:    Patient ID: [name], male    DOB: September 27, 1940 .   MRN: [id number]  HPI 09/30/10- 48 yoM former smoker followed for asthma/ COPD, allergic rhinitis .   01/02/12-  70 yoM former smoker followed for asthma/ COPD, allergic rhinitis ACUTE VISIT:Increased congestion in chest and wheezing, also would like to discuss when to take Allegra. Still wheezing in the mornings some, after seeing nurse practitioner August 16. No infection. He thinks he is more easily short of breath with exertion but admits he is walking less regularly and may have gained some weight. He wants to blame increased cavities and dental problems on his inhalers. We discussed dry mouth. He has started using biotene some. Uses albuterol rescue inhaler infrequently. Continues allergy vaccine 1:10 GH and denies sneezing, nasal congestion or rhinorrhea. Office spirometry- 01/02/12- suboptimal effort ACE based on shape of curve: Mild obstructive airways disease. FVC 3.66/82%, FEV1 2.43/71%, FEV1/FEC 0.66/87%, FEF 25-75% 1.33/44% predicted. CXR 12/12/11-  IMPRESSION:  No active cardiopulmonary disease.  Original Report Authenticated By: Danae Orleans, M.D.   03/17/2012 f/u ov/Wert -cc loosing ground since Sept 2013 on allergy shots  Weekly symptoms went from bad to worse x one week Lots of chest congestion, coughing with production of white mucus already started  on zpak - maintained on dulera but technique not aoptimal, not using any rescue but sob minimal over baseline.  Cough worse  At hs.  Assoc with nasal congestion not responding to allegra and sense of pnds. Denies any obvious fluctuation of symptoms with weather or environmental changes or other aggravating or alleviating factors except as outlined above   03/23/12- 70 yoM former smoker followed for asthma/ COPD, allergic rhinitis He called the office today complaining of head congestion with ear pressure and we brought him in specifically to get nebulizer treatment  and Depo-Medrol for presumed eustachian dysfunction.  Exam ct, PERRLA, conjunctivae and secretions clear            Ears- Hearing, canals-normal            Nose- Clear, no-Septal dev, mucus, polyps, erosion, perforation             Throat- Mallampati II , mucosa clear , drainage- none, tonsils- atrophic Neck- flexible , trachea midline, no stridor , thyroid nl, carotid no bruit Chest - symmetrical excursion , unlabored           Heart/CV- RRR , no murmur , no gallop  , no rub, nl s1 s2                           - JVD- none , edema- none, stasis changes- none, varices- none           Lung-   Unlabored min bilateral exp rhonchi.           Chest wall-     Assessment & Plan:

## 2012-03-26 ENCOUNTER — Telehealth: Payer: Self-pay | Admitting: Internal Medicine

## 2012-03-26 DIAGNOSIS — H9190 Unspecified hearing loss, unspecified ear: Secondary | ICD-10-CM

## 2012-03-26 NOTE — Telephone Encounter (Signed)
Patient Instructions     Neb neo nasal  Depo 80  INB will refer to ENT for eustachian dysfunction    -------  PCC's does this mean we need put the referral in. Please advise thanks

## 2012-03-26 NOTE — Telephone Encounter (Signed)
Order has been placed. Thought this was already done at visit; must have deleted it prior to signing. Sorry for the delay.

## 2012-03-26 NOTE — Telephone Encounter (Signed)
Referral needs to be placed for ENT evaluate for hearing loss. Rhonda J Cobb

## 2012-03-29 ENCOUNTER — Telehealth: Payer: Self-pay | Admitting: Internal Medicine

## 2012-03-29 NOTE — Telephone Encounter (Signed)
I spoke with pt and he stated the hearing is getting worse each day in the left ear and wants an APPT ASAP. Please advise PCC's. thanks

## 2012-03-29 NOTE — Telephone Encounter (Signed)
Dr. Allayne Stack office closed early on Wed and was closed all day on Friday. Was unable to schedule appointment until this morning. Appointment has been scheduled for Tues 03/30/12 at 1:30 with Dr. Haroldine Laws and at 2:00 to see the audiologist. Called and spoke with patient and he is aware of appointment date, time and location. Referral has been faxed to Dr. Haroldine Laws. Rhonda J Cobb

## 2012-03-30 DIAGNOSIS — H6520 Chronic serous otitis media, unspecified ear: Secondary | ICD-10-CM | POA: Diagnosis not present

## 2012-03-30 DIAGNOSIS — H903 Sensorineural hearing loss, bilateral: Secondary | ICD-10-CM | POA: Diagnosis not present

## 2012-03-30 DIAGNOSIS — H905 Unspecified sensorineural hearing loss: Secondary | ICD-10-CM | POA: Diagnosis not present

## 2012-03-30 DIAGNOSIS — H9120 Sudden idiopathic hearing loss, unspecified ear: Secondary | ICD-10-CM | POA: Diagnosis not present

## 2012-03-30 DIAGNOSIS — H9319 Tinnitus, unspecified ear: Secondary | ICD-10-CM | POA: Diagnosis not present

## 2012-04-01 ENCOUNTER — Ambulatory Visit (INDEPENDENT_AMBULATORY_CARE_PROVIDER_SITE_OTHER): Payer: Medicare Other

## 2012-04-01 DIAGNOSIS — J309 Allergic rhinitis, unspecified: Secondary | ICD-10-CM

## 2012-04-05 ENCOUNTER — Encounter: Payer: Self-pay | Admitting: Internal Medicine

## 2012-04-05 DIAGNOSIS — H6990 Unspecified Eustachian tube disorder, unspecified ear: Secondary | ICD-10-CM | POA: Insufficient documentation

## 2012-04-05 DIAGNOSIS — H6980 Other specified disorders of Eustachian tube, unspecified ear: Secondary | ICD-10-CM | POA: Insufficient documentation

## 2012-04-05 NOTE — Assessment & Plan Note (Signed)
Secondary to persistent rhinitis. Plan-nasal nebulizer treatment, Depo-Medrol

## 2012-04-06 ENCOUNTER — Ambulatory Visit (INDEPENDENT_AMBULATORY_CARE_PROVIDER_SITE_OTHER): Payer: Medicare Other

## 2012-04-06 DIAGNOSIS — H6520 Chronic serous otitis media, unspecified ear: Secondary | ICD-10-CM | POA: Diagnosis not present

## 2012-04-06 DIAGNOSIS — J309 Allergic rhinitis, unspecified: Secondary | ICD-10-CM | POA: Diagnosis not present

## 2012-04-08 ENCOUNTER — Encounter: Payer: Self-pay | Admitting: Cardiology

## 2012-04-08 ENCOUNTER — Ambulatory Visit (INDEPENDENT_AMBULATORY_CARE_PROVIDER_SITE_OTHER): Payer: Medicare Other | Admitting: Cardiology

## 2012-04-08 VITALS — BP 129/79 | HR 99 | Ht 71.0 in | Wt 263.8 lb

## 2012-04-08 DIAGNOSIS — E7800 Pure hypercholesterolemia, unspecified: Secondary | ICD-10-CM | POA: Diagnosis not present

## 2012-04-08 DIAGNOSIS — I2510 Atherosclerotic heart disease of native coronary artery without angina pectoris: Secondary | ICD-10-CM | POA: Diagnosis not present

## 2012-04-08 DIAGNOSIS — R0989 Other specified symptoms and signs involving the circulatory and respiratory systems: Secondary | ICD-10-CM | POA: Diagnosis not present

## 2012-04-08 NOTE — Assessment & Plan Note (Signed)
Continue statin. Lipids and liver monitored by primary care. 

## 2012-04-08 NOTE — Assessment & Plan Note (Signed)
Continue aspirin and statin. 

## 2012-04-08 NOTE — Progress Notes (Signed)
HPI: Pleasant gentleman with history of coronary disease for F/U. Patient has had previous PCI of his LAD. His last  Echocardiogram in Oct 2009 showed an ejection fraction of 50-55% and trace aortic insufficiency. Myoview in December of 2012 showed an ejection fraction of 49%. There was scar with mild peri-infarct ischemia in the distal anteroseptal and apical wall. We are treating medically. I last saw him in Nov 2012. Since then the patient denies any dyspnea on exertion, orthopnea, PND, pedal edema, palpitations, syncope or chest pain.   Current Outpatient Prescriptions  Medication Sig Dispense Refill  . aspirin EC 81 MG tablet Take 81 mg by mouth daily.       Marland Kitchen atorvastatin (LIPITOR) 40 MG tablet Take 40 mg by mouth daily.        . betamethasone dipropionate (DIPROLENE) 0.05 % ointment As needed       . calcipotriene-betamethasone (TACLONEX) ointment Apply topically 2 (two) times daily.      . Cholecalciferol (VITAMIN D3) 2000 UNITS TABS Once a day       . clindamycin (CLEOCIN) 150 MG capsule Take 150 mg by mouth 3 (three) times daily.      . cycloSPORINE (RESTASIS) 0.05 % ophthalmic emulsion Place 1 drop into both eyes 2 (two) times daily.        . fexofenadine (ALLEGRA) 180 MG tablet Take 180 mg by mouth daily.        . finasteride (PROSCAR) 5 MG tablet Take 5 mg by mouth daily.        Marland Kitchen glucosamine-chondroitin 500-400 MG tablet 2 tablets at breakfast      . levothyroxine (SYNTHROID, LEVOTHROID) 150 MCG tablet Take 150 mcg by mouth daily.        . mometasone-formoterol (DULERA) 100-5 MCG/ACT AERO Inhale 2 puffs into the lungs 2 (two) times daily. Rinse mouth  1 Inhaler  11  . Multiple Vitamins-Minerals (SENIOR MULTIVITAMIN PLUS PO) Once a day       . naproxen sodium (ANAPROX) 220 MG tablet Take 220 mg by mouth 2 (two) times daily as needed.       . nitroGLYCERIN (NITROLINGUAL) 0.4 MG/SPRAY spray Use as directed as needed       . OMEGA 3 1000 MG CAPS Take 1 capsule by mouth daily.          . pantoprazole (PROTONIX) 40 MG tablet Take 30-60 min before first meal of the day      . Probiotic Product (PROBIOTIC DAILY PO) Take by mouth.      . sodium chloride 0.9 % SOLN 250 mL with phenylephrine 10 MG/ML SOLN 0.4 mL Apply topically once.      Marland Kitchen VIAGRA 100 MG tablet Take 1 tablet by mouth Once daily as needed.      Marland Kitchen amoxicillin (AMOXIL) 500 MG capsule Prn dental          Past Medical History  Diagnosis Date  . Hyperlipidemia   . Hiatal hernia   . GERD (gastroesophageal reflux disease)   . Hypothyroidism   . Allergic asthma   . COPD (chronic obstructive pulmonary disease)   . CAD (coronary artery disease)   . BPH (benign prostatic hyperplasia)   . Psoriasis     Past Surgical History  Procedure Date  . Left knee arthroscopy   . Tonsillectomy   . Nasal sinus surgery     History   Social History  . Marital Status: Married    Spouse Name: N/A    Number  of Children: N/A  . Years of Education: N/A   Occupational History  . Minister    Social History Main Topics  . Smoking status: Former Smoker -- 3.0 packs/day for 33 years    Types: Pipe, Cigars    Quit date: 04/28/1986  . Smokeless tobacco: Not on file  . Alcohol Use: Yes  . Drug Use: No  . Sexually Active: Not on file   Other Topics Concern  . Not on file   Social History Narrative  . No narrative on file    ROS: no fevers or chills, productive cough, hemoptysis, dysphasia, odynophagia, melena, hematochezia, dysuria, hematuria, rash, seizure activity, orthopnea, PND, pedal edema, claudication. Remaining systems are negative.  Physical Exam: Well-developed well-nourished in no acute distress.  Skin is warm and dry.  HEENT is normal.  Neck is supple.  Chest is clear to auscultation with normal expansion.  Cardiovascular exam is regular rate and rhythm.  Abdominal exam nontender or distended. No masses palpated. Positive bruit Extremities show no edema. neuro grossly intact  ECG sinus rhythm at a  rate of 93. Right bundle branch block.

## 2012-04-08 NOTE — Assessment & Plan Note (Signed)
Schedule ultrasound to exclude aneurysm. 

## 2012-04-08 NOTE — Patient Instructions (Addendum)
Your physician wants you to follow-up in: ONE YEAR WITH DR CRENSHAW You will receive a reminder letter in the mail two months in advance. If you don't receive a letter, please call our office to schedule the follow-up appointment.   Your physician has requested that you have an abdominal aorta duplex. During this test, an ultrasound is used to evaluate the aorta. Allow 30 minutes for this exam. Do not eat after midnight the day before and avoid carbonated beverages  

## 2012-04-12 ENCOUNTER — Ambulatory Visit (INDEPENDENT_AMBULATORY_CARE_PROVIDER_SITE_OTHER): Payer: Medicare Other

## 2012-04-12 DIAGNOSIS — J309 Allergic rhinitis, unspecified: Secondary | ICD-10-CM | POA: Diagnosis not present

## 2012-04-22 ENCOUNTER — Ambulatory Visit (INDEPENDENT_AMBULATORY_CARE_PROVIDER_SITE_OTHER): Payer: Medicare Other

## 2012-04-22 DIAGNOSIS — J309 Allergic rhinitis, unspecified: Secondary | ICD-10-CM

## 2012-04-23 ENCOUNTER — Ambulatory Visit (INDEPENDENT_AMBULATORY_CARE_PROVIDER_SITE_OTHER): Payer: Medicare Other

## 2012-04-23 DIAGNOSIS — J309 Allergic rhinitis, unspecified: Secondary | ICD-10-CM | POA: Diagnosis not present

## 2012-04-27 ENCOUNTER — Encounter: Payer: Self-pay | Admitting: Internal Medicine

## 2012-04-29 ENCOUNTER — Ambulatory Visit (INDEPENDENT_AMBULATORY_CARE_PROVIDER_SITE_OTHER): Payer: Medicare Other

## 2012-04-29 DIAGNOSIS — J309 Allergic rhinitis, unspecified: Secondary | ICD-10-CM | POA: Diagnosis not present

## 2012-05-06 ENCOUNTER — Ambulatory Visit (INDEPENDENT_AMBULATORY_CARE_PROVIDER_SITE_OTHER): Payer: Medicare Other

## 2012-05-06 DIAGNOSIS — J309 Allergic rhinitis, unspecified: Secondary | ICD-10-CM

## 2012-05-10 ENCOUNTER — Ambulatory Visit (INDEPENDENT_AMBULATORY_CARE_PROVIDER_SITE_OTHER): Payer: Medicare Other

## 2012-05-10 DIAGNOSIS — J309 Allergic rhinitis, unspecified: Secondary | ICD-10-CM | POA: Diagnosis not present

## 2012-05-13 ENCOUNTER — Encounter (INDEPENDENT_AMBULATORY_CARE_PROVIDER_SITE_OTHER): Payer: Medicare Other

## 2012-05-13 DIAGNOSIS — R0989 Other specified symptoms and signs involving the circulatory and respiratory systems: Secondary | ICD-10-CM

## 2012-05-17 ENCOUNTER — Ambulatory Visit: Payer: Medicare Other

## 2012-05-18 ENCOUNTER — Ambulatory Visit (INDEPENDENT_AMBULATORY_CARE_PROVIDER_SITE_OTHER): Payer: Medicare Other

## 2012-05-18 DIAGNOSIS — J309 Allergic rhinitis, unspecified: Secondary | ICD-10-CM | POA: Diagnosis not present

## 2012-05-24 ENCOUNTER — Ambulatory Visit: Payer: Medicare Other

## 2012-05-25 ENCOUNTER — Ambulatory Visit (INDEPENDENT_AMBULATORY_CARE_PROVIDER_SITE_OTHER): Payer: Medicare Other

## 2012-05-25 DIAGNOSIS — J309 Allergic rhinitis, unspecified: Secondary | ICD-10-CM

## 2012-05-31 ENCOUNTER — Ambulatory Visit: Payer: Medicare Other

## 2012-06-01 ENCOUNTER — Ambulatory Visit (INDEPENDENT_AMBULATORY_CARE_PROVIDER_SITE_OTHER): Payer: Medicare Other

## 2012-06-01 DIAGNOSIS — J309 Allergic rhinitis, unspecified: Secondary | ICD-10-CM

## 2012-06-07 ENCOUNTER — Ambulatory Visit: Payer: Medicare Other

## 2012-06-07 DIAGNOSIS — M25569 Pain in unspecified knee: Secondary | ICD-10-CM | POA: Diagnosis not present

## 2012-06-08 DIAGNOSIS — M542 Cervicalgia: Secondary | ICD-10-CM | POA: Diagnosis not present

## 2012-06-08 DIAGNOSIS — M47812 Spondylosis without myelopathy or radiculopathy, cervical region: Secondary | ICD-10-CM | POA: Diagnosis not present

## 2012-06-14 ENCOUNTER — Ambulatory Visit: Payer: Medicare Other

## 2012-06-15 ENCOUNTER — Ambulatory Visit (INDEPENDENT_AMBULATORY_CARE_PROVIDER_SITE_OTHER): Payer: Medicare Other

## 2012-06-15 DIAGNOSIS — J309 Allergic rhinitis, unspecified: Secondary | ICD-10-CM | POA: Diagnosis not present

## 2012-06-15 DIAGNOSIS — M542 Cervicalgia: Secondary | ICD-10-CM | POA: Diagnosis not present

## 2012-06-17 DIAGNOSIS — M542 Cervicalgia: Secondary | ICD-10-CM | POA: Diagnosis not present

## 2012-06-21 ENCOUNTER — Ambulatory Visit: Payer: Medicare Other

## 2012-06-22 ENCOUNTER — Ambulatory Visit (INDEPENDENT_AMBULATORY_CARE_PROVIDER_SITE_OTHER): Payer: Medicare Other

## 2012-06-22 DIAGNOSIS — J309 Allergic rhinitis, unspecified: Secondary | ICD-10-CM

## 2012-06-22 DIAGNOSIS — M542 Cervicalgia: Secondary | ICD-10-CM | POA: Diagnosis not present

## 2012-06-24 DIAGNOSIS — M542 Cervicalgia: Secondary | ICD-10-CM | POA: Diagnosis not present

## 2012-06-28 ENCOUNTER — Ambulatory Visit: Payer: Medicare Other

## 2012-06-29 ENCOUNTER — Ambulatory Visit (INDEPENDENT_AMBULATORY_CARE_PROVIDER_SITE_OTHER): Payer: Medicare Other

## 2012-06-29 DIAGNOSIS — M542 Cervicalgia: Secondary | ICD-10-CM | POA: Diagnosis not present

## 2012-06-29 DIAGNOSIS — J309 Allergic rhinitis, unspecified: Secondary | ICD-10-CM | POA: Diagnosis not present

## 2012-07-01 ENCOUNTER — Encounter: Payer: Self-pay | Admitting: Internal Medicine

## 2012-07-01 ENCOUNTER — Ambulatory Visit (INDEPENDENT_AMBULATORY_CARE_PROVIDER_SITE_OTHER): Payer: Medicare Other | Admitting: Internal Medicine

## 2012-07-01 VITALS — BP 152/82 | HR 96 | Ht 70.5 in | Wt 275.0 lb

## 2012-07-01 DIAGNOSIS — M542 Cervicalgia: Secondary | ICD-10-CM | POA: Diagnosis not present

## 2012-07-01 DIAGNOSIS — J449 Chronic obstructive pulmonary disease, unspecified: Secondary | ICD-10-CM | POA: Diagnosis not present

## 2012-07-01 DIAGNOSIS — J301 Allergic rhinitis due to pollen: Secondary | ICD-10-CM

## 2012-07-01 DIAGNOSIS — J45998 Other asthma: Secondary | ICD-10-CM

## 2012-07-01 DIAGNOSIS — J4489 Other specified chronic obstructive pulmonary disease: Secondary | ICD-10-CM

## 2012-07-01 DIAGNOSIS — J45909 Unspecified asthma, uncomplicated: Secondary | ICD-10-CM

## 2012-07-01 NOTE — Progress Notes (Signed)
Subjective:    Patient ID: [name], male    DOB: May 06, 1940 .   MRN: [id number]  HPI 09/30/10- 30 yoM former smoker followed for asthma/ COPD, allergic rhinitis .   01/02/12-  70 yoM former smoker followed for asthma/ COPD, allergic rhinitis ACUTE VISIT:Increased congestion in chest and wheezing, also would like to discuss when to take Allegra. Still wheezing in the mornings some, after seeing nurse practitioner August 16. No infection. He thinks he is more easily short of breath with exertion but admits he is walking less regularly and may have gained some weight. He wants to blame increased cavities and dental problems on his inhalers. We discussed dry mouth. He has started using biotene some. Uses albuterol rescue inhaler infrequently. Continues allergy vaccine 1:10 GH and denies sneezing, nasal congestion or rhinorrhea. Office spirometry- 01/02/12- suboptimal effort ACE based on shape of curve: Mild obstructive airways disease. FVC 3.66/82%, FEV1 2.43/71%, FEV1/FEC 0.66/87%, FEF 25-75% 1.33/44% predicted. CXR 12/12/11-  IMPRESSION:  No active cardiopulmonary disease.  Original Report Authenticated By: Danae Orleans, M.D.   03/17/2012 f/u ov/Wert -cc loosing ground since Sept 2013 on allergy shots  Weekly symptoms went from bad to worse x one week Lots of chest congestion, coughing with production of white mucus already started  on zpak - maintained on dulera but technique not aoptimal, not using any rescue but sob minimal over baseline.  Cough worse  At hs.  Assoc with nasal congestion not responding to allegra and sense of pnds. Denies any obvious fluctuation of symptoms with weather or environmental changes or other aggravating or alleviating factors except as outlined above   03/23/12- 70 yoM former smoker followed for asthma/ COPD, allergic rhinitis He called the office today complaining of head congestion with ear pressure and we brought him in specifically to get nebulizer treatment  and Depo-Medrol for presumed eustachian dysfunction.  07/01/12- 89 yoM former smoker followed for asthma/ COPD, allergic rhinitis FOLLOWS FOR: still on allergy vaccine 1:10 GH and doing well; denies any troubles with allergies at this time. Doing so well that he occasionally forgets a Dulera dose. Never needs rescue inhaler.   ROS-see HPI Constitutional:   No-   weight loss, night sweats, fevers, chills, fatigue, lassitude. HEENT:   No-  headaches, difficulty swallowing, tooth/dental problems, sore throat,       No-  sneezing, itching, ear ache, nasal congestion, post nasal drip,  CV:  No-   chest pain, orthopnea, PND, swelling in lower extremities, anasarca,                                  dizziness, palpitations Resp: No-   shortness of breath with exertion or at rest.              No-   productive cough,  No non-productive cough,  No- coughing up of blood.              No-   change in color of mucus.  No- wheezing.   Skin: No-   rash or lesions. GI:  No-   heartburn, indigestion, abdominal pain, nausea, vomiting, GU:  MS:  No-   joint pain or swelling.   Neuro-     nothing unusual Psych:  No- change in mood or affect. No depression or anxiety.  No memory loss.  OBJ- Physical Exam General- Alert, Oriented, Affect-appropriate, Distress- none acute Skin- rash-none, lesions- none,  excoriation- none Lymphadenopathy- none Head- atraumatic            Eyes- Gross vision intact, PERRLA, conjunctivae and secretions clear            Ears- Hearing, canals-normal            Nose- Clear, no-Septal dev, mucus, polyps, erosion, perforation             Throat- Mallampati II , mucosa clear , drainage- none, tonsils- atrophic Neck- flexible , trachea midline, no stridor , thyroid nl, carotid no bruit Chest - symmetrical excursion , unlabored           Heart/CV- RRR , no murmur , no gallop  , no rub, nl s1 s2                           - JVD- none , edema- none, stasis changes- none, varices- none            Lung- clear to P&A, wheeze- none, cough- none , dullness-none, rub- none           Chest wall-  Abd-  Br/ Gen/ Rectal- Not done, not indicated Extrem- cyanosis- none, clubbing, none, atrophy- none, strength- nl Neuro- grossly intact to observation Assessment & Plan:

## 2012-07-01 NOTE — Patient Instructions (Addendum)
Please call as needed  We can continue allergy vaccine 1:1`0 GH

## 2012-07-02 NOTE — Assessment & Plan Note (Signed)
We discussed up-coming Spring pollen season, and will see how that progresses this year.

## 2012-07-02 NOTE — Assessment & Plan Note (Signed)
Very well controlled on Dulera 100, with no need for rescue inhaler this winter.  Continues allergy vaccine 1:10 GH

## 2012-07-05 DIAGNOSIS — M542 Cervicalgia: Secondary | ICD-10-CM | POA: Diagnosis not present

## 2012-07-06 ENCOUNTER — Ambulatory Visit (INDEPENDENT_AMBULATORY_CARE_PROVIDER_SITE_OTHER): Payer: Medicare Other

## 2012-07-06 DIAGNOSIS — J309 Allergic rhinitis, unspecified: Secondary | ICD-10-CM | POA: Diagnosis not present

## 2012-07-08 DIAGNOSIS — M25569 Pain in unspecified knee: Secondary | ICD-10-CM | POA: Diagnosis not present

## 2012-07-08 DIAGNOSIS — M542 Cervicalgia: Secondary | ICD-10-CM | POA: Diagnosis not present

## 2012-07-09 DIAGNOSIS — M542 Cervicalgia: Secondary | ICD-10-CM | POA: Diagnosis not present

## 2012-07-09 DIAGNOSIS — Q7649 Other congenital malformations of spine, not associated with scoliosis: Secondary | ICD-10-CM | POA: Diagnosis not present

## 2012-07-12 DIAGNOSIS — M542 Cervicalgia: Secondary | ICD-10-CM | POA: Diagnosis not present

## 2012-07-12 DIAGNOSIS — Q7649 Other congenital malformations of spine, not associated with scoliosis: Secondary | ICD-10-CM | POA: Diagnosis not present

## 2012-07-15 ENCOUNTER — Ambulatory Visit (INDEPENDENT_AMBULATORY_CARE_PROVIDER_SITE_OTHER): Payer: Medicare Other

## 2012-07-15 DIAGNOSIS — J309 Allergic rhinitis, unspecified: Secondary | ICD-10-CM

## 2012-07-15 DIAGNOSIS — M542 Cervicalgia: Secondary | ICD-10-CM | POA: Diagnosis not present

## 2012-07-19 DIAGNOSIS — M542 Cervicalgia: Secondary | ICD-10-CM | POA: Diagnosis not present

## 2012-07-20 ENCOUNTER — Ambulatory Visit (INDEPENDENT_AMBULATORY_CARE_PROVIDER_SITE_OTHER): Payer: Medicare Other

## 2012-07-20 DIAGNOSIS — J309 Allergic rhinitis, unspecified: Secondary | ICD-10-CM | POA: Diagnosis not present

## 2012-07-23 DIAGNOSIS — M5030 Other cervical disc degeneration, unspecified cervical region: Secondary | ICD-10-CM | POA: Diagnosis not present

## 2012-07-26 ENCOUNTER — Ambulatory Visit (INDEPENDENT_AMBULATORY_CARE_PROVIDER_SITE_OTHER): Payer: Medicare Other

## 2012-07-26 DIAGNOSIS — N529 Male erectile dysfunction, unspecified: Secondary | ICD-10-CM | POA: Diagnosis not present

## 2012-07-26 DIAGNOSIS — Z125 Encounter for screening for malignant neoplasm of prostate: Secondary | ICD-10-CM | POA: Diagnosis not present

## 2012-07-26 DIAGNOSIS — Z0000 Encounter for general adult medical examination without abnormal findings: Secondary | ICD-10-CM | POA: Diagnosis not present

## 2012-07-26 DIAGNOSIS — E039 Hypothyroidism, unspecified: Secondary | ICD-10-CM | POA: Diagnosis not present

## 2012-07-26 DIAGNOSIS — J309 Allergic rhinitis, unspecified: Secondary | ICD-10-CM | POA: Diagnosis not present

## 2012-07-26 DIAGNOSIS — Z7901 Long term (current) use of anticoagulants: Secondary | ICD-10-CM | POA: Diagnosis not present

## 2012-07-26 DIAGNOSIS — E7800 Pure hypercholesterolemia, unspecified: Secondary | ICD-10-CM | POA: Diagnosis not present

## 2012-07-26 DIAGNOSIS — I10 Essential (primary) hypertension: Secondary | ICD-10-CM | POA: Diagnosis not present

## 2012-07-27 ENCOUNTER — Ambulatory Visit: Payer: Medicare Other

## 2012-07-30 DIAGNOSIS — L570 Actinic keratosis: Secondary | ICD-10-CM | POA: Diagnosis not present

## 2012-07-30 DIAGNOSIS — R609 Edema, unspecified: Secondary | ICD-10-CM | POA: Diagnosis not present

## 2012-07-30 DIAGNOSIS — Z1212 Encounter for screening for malignant neoplasm of rectum: Secondary | ICD-10-CM | POA: Diagnosis not present

## 2012-07-30 DIAGNOSIS — E039 Hypothyroidism, unspecified: Secondary | ICD-10-CM | POA: Diagnosis not present

## 2012-07-30 DIAGNOSIS — E7800 Pure hypercholesterolemia, unspecified: Secondary | ICD-10-CM | POA: Diagnosis not present

## 2012-07-30 DIAGNOSIS — Z0000 Encounter for general adult medical examination without abnormal findings: Secondary | ICD-10-CM | POA: Diagnosis not present

## 2012-07-30 DIAGNOSIS — I10 Essential (primary) hypertension: Secondary | ICD-10-CM | POA: Diagnosis not present

## 2012-08-03 ENCOUNTER — Ambulatory Visit: Payer: Medicare Other | Admitting: Internal Medicine

## 2012-08-03 ENCOUNTER — Ambulatory Visit (INDEPENDENT_AMBULATORY_CARE_PROVIDER_SITE_OTHER): Payer: Medicare Other

## 2012-08-03 DIAGNOSIS — J309 Allergic rhinitis, unspecified: Secondary | ICD-10-CM | POA: Diagnosis not present

## 2012-08-04 ENCOUNTER — Other Ambulatory Visit: Payer: Self-pay | Admitting: Internal Medicine

## 2012-08-04 NOTE — Telephone Encounter (Signed)
Error

## 2012-08-10 ENCOUNTER — Ambulatory Visit (INDEPENDENT_AMBULATORY_CARE_PROVIDER_SITE_OTHER): Payer: Medicare Other

## 2012-08-10 DIAGNOSIS — J309 Allergic rhinitis, unspecified: Secondary | ICD-10-CM

## 2012-08-11 DIAGNOSIS — M47812 Spondylosis without myelopathy or radiculopathy, cervical region: Secondary | ICD-10-CM | POA: Diagnosis not present

## 2012-08-11 DIAGNOSIS — M542 Cervicalgia: Secondary | ICD-10-CM | POA: Diagnosis not present

## 2012-08-12 ENCOUNTER — Telehealth: Payer: Self-pay | Admitting: Internal Medicine

## 2012-08-12 NOTE — Telephone Encounter (Signed)
Per CY - Joyce Copa D, try Claritin D 12 hour at pharmacy counter once or twice daily x 2-3 days.  Pt is aware of CY recommendations.

## 2012-08-12 NOTE — Telephone Encounter (Signed)
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 Dr Maple Hudson please advise, thanks!  NKDA - verified [facility]

## 2012-08-13 ENCOUNTER — Telehealth: Payer: Self-pay | Admitting: Internal Medicine

## 2012-08-13 DIAGNOSIS — J209 Acute bronchitis, unspecified: Secondary | ICD-10-CM

## 2012-08-13 MED ORDER — LEVOFLOXACIN 500 MG PO TABS: 500.0000 mg | ORAL_TABLET | Freq: Every day | ORAL | Status: DC

## 2012-08-13 NOTE — Telephone Encounter (Signed)
Allergies worse but since yesteray more fatigued, no worsning of dyspnea and no wheeze. Today green sputum   Sounds like bronchitis   take levaquin 500mg  once daily  X 5 days - [facility] Cost is $   Dr. Kalman Shan, M.D., [facility].C.P Pulmonary and Critical Care Medicine Staff Physician [facility] [place] [facility] Pager: [phone], If no answer or between  15:00h - 7:00h: call [phone]  08/13/2012 5:21 PM

## 2012-08-17 ENCOUNTER — Ambulatory Visit: Payer: Medicare Other

## 2012-08-17 ENCOUNTER — Ambulatory Visit (INDEPENDENT_AMBULATORY_CARE_PROVIDER_SITE_OTHER): Payer: Medicare Other

## 2012-08-17 DIAGNOSIS — J309 Allergic rhinitis, unspecified: Secondary | ICD-10-CM

## 2012-08-18 ENCOUNTER — Ambulatory Visit (INDEPENDENT_AMBULATORY_CARE_PROVIDER_SITE_OTHER): Payer: Medicare Other

## 2012-08-18 ENCOUNTER — Encounter: Payer: Self-pay | Admitting: Internal Medicine

## 2012-08-18 ENCOUNTER — Ambulatory Visit (INDEPENDENT_AMBULATORY_CARE_PROVIDER_SITE_OTHER): Payer: Medicare Other | Admitting: Internal Medicine

## 2012-08-18 ENCOUNTER — Telehealth: Payer: Self-pay | Admitting: Internal Medicine

## 2012-08-18 VITALS — BP 118/78 | HR 110 | Temp 96.7°F | Ht 71.0 in | Wt 269.4 lb

## 2012-08-18 DIAGNOSIS — J309 Allergic rhinitis, unspecified: Secondary | ICD-10-CM

## 2012-08-18 DIAGNOSIS — J45901 Unspecified asthma with (acute) exacerbation: Secondary | ICD-10-CM | POA: Diagnosis not present

## 2012-08-18 MED ORDER — PREDNISONE 10 MG PO TABS: ORAL_TABLET | ORAL | Status: DC

## 2012-08-18 MED ORDER — GUAIFENESIN-CODEINE 100-10 MG/5ML PO SYRP: 5.0000 mL | ORAL_SOLUTION | Freq: Two times a day (BID) | ORAL | Status: DC | PRN

## 2012-08-18 NOTE — Telephone Encounter (Signed)
Spoke with patient--  He states last week he "woke up with laryngitis"-- took regular maintenance meds which helped a little until toward the end of the week-- MR gave pt levaquin on 08/13/12-- per pt this really helped, however he ended course yesterday and This morning woke up with congestion and thick yellow mucus that he is unable to clear from chest. Patient requesting to be seen today by any provider. I have scheduled patient to be seen by MR today at 215pm. Patient expressed appreciation and verbalized understanding of appt time. Nothing further needed at this time.

## 2012-08-18 NOTE — Patient Instructions (Addendum)
Glad you're feeling better but I agree that you still have room for improvement Your lung function is reduced compared to baseline So likely you are recovering from a mild asthma exacerbation as well In order to facilitate your improvement   - Take prednisone 40 mg daily x 2 days, then 20mg  daily x 2 days, then 10mg  daily x 2 days, then 5mg  daily x 2 days and stop  Followup with Dr. Maple Hudson as previously scheduled

## 2012-08-18 NOTE — Progress Notes (Signed)
Subjective:    Patient ID: [name], male    DOB: Nov 15, 1940, 72 y.o.   MRN: [id number]  HPI 09/30/10- 64 yoM former smoker followed for asthma/ COPD, allergic rhinitis .   She has a 1-  85 yoM former smoker followed for asthma/ COPD, allergic rhinitis ACUTE VISIT:Increased congestion in chest and wheezing, also would like to discuss when to take Allegra. Still wheezing in the mornings some, after seeing nurse practitioner August 16. No infection. He thinks he is more easily short of breath with exertion but admits he is walking less regularly and may have gained some weight. He wants to blame increased cavities and dental problems on his inhalers. We discussed dry mouth. He has started using biotene some. Uses albuterol rescue inhaler infrequently. Continues allergy vaccine 1:10 GH and denies sneezing, nasal congestion or rhinorrhea. Office spirometry- 01/02/12- suboptimal effort ACE based on shape of curve: Mild obstructive airways disease. FVC 3.66/82%, FEV1 2.43/71%, FEV1/FEC 0.66/87%, FEF 25-75% 1.33/44% predicted. CXR 12/12/11-  IMPRESSION:  No active cardiopulmonary disease.  Original Report Authenticated By: Danae Orleans, M.D.   03/17/2012 f/u ov/Wert -cc loosing ground since Sept 2013 on allergy shots  Weekly symptoms went from bad to worse x one week Lots of chest congestion, coughing with production of white mucus already started  on zpak - maintained on dulera but technique not aoptimal, not using any rescue but sob minimal over baseline.  Cough worse  At hs.  Assoc with nasal congestion not responding to allegra and sense of pnds. Denies any obvious fluctuation of symptoms with weather or environmental changes or other aggravating or alleviating factors except as outlined above   03/23/12- 70 yoM former smoker followed for asthma/ COPD, allergic rhinitis He called the office today complaining of head congestion with ear pressure and we brought him in specifically to get  nebulizer treatment and Depo-Medrol for presumed eustachian dysfunction.  07/01/12- 50 yoM former smoker followed for asthma/ COPD, allergic rhinitis FOLLOWS FOR: still on allergy vaccine 1:10 GH and doing well; denies any troubles with allergies at this time. Doing so well that he occasionally forgets a Dulera dose. Never needs rescue inhaler.   Please call as needed  We can continue allergy vaccine 1:1`0 GH   ACUTE OV 08/18/2012  - This is an acute office visit for patient with asthma/allergies/COPD being followed by Dr. Joni Fears young  - A week ago he started having insidious onset of laryngitis. On 08/13/2012 he called into the answering service and complained of symptoms of laryngitis and green sputum. I called in 5 days of Levaquin which she completed yesterday. This has helped his symptoms immensely. However, he feels that he is still not back to baseline. This is interfering with his son and to the church due to  coarse voice. In addition he has significant fatigue and his sputum though not Ch[place] Si is now yellow although the volume is reduced. He denies any fever, shortness of breath or wheezing that is out of proportion to her normal range. He is wondering about a repeat  antibiotic or doing a first course of steroids  - Spirometry today April 23rd 2014 shows FEV1 2.26 L/65%. And a ratio 70. The FEV1 is significantly down compared to baseline   Review of Systems  Constitutional: Negative for fever and unexpected weight change.  HENT: Positive for congestion. Negative for ear pain, nosebleeds, sore throat, rhinorrhea, sneezing, trouble swallowing, dental problem, postnasal drip and sinus pressure.   Eyes: Negative for redness  and itching.  Respiratory: Positive for cough. Negative for chest tightness, shortness of breath and wheezing.   Cardiovascular: Negative for palpitations and leg swelling.  Gastrointestinal: Negative for nausea and vomiting.  Genitourinary: Negative for dysuria.   Musculoskeletal: Negative for joint swelling.  Skin: Negative for rash.  Neurological: Negative for headaches.  Hematological: Does not bruise/bleed easily.  Psychiatric/Behavioral: Negative for dysphoric mood. The patient is not nervous/anxious.       Current outpatient prescriptions:amoxicillin (AMOXIL) 500 MG capsule, Prn dental , Disp: , Rfl: ;  aspirin EC 81 MG tablet, Take 81 mg by mouth daily. , Disp: , Rfl: ;  atorvastatin (LIPITOR) 40 MG tablet, Take 40 mg by mouth daily.  , Disp: , Rfl: ;  calcipotriene-betamethasone (TACLONEX) ointment, Apply topically 2 (two) times daily., Disp: , Rfl: ;  Cholecalciferol (VITAMIN D3) 2000 UNITS TABS, Once a day , Disp: , Rfl:  cycloSPORINE (RESTASIS) 0.05 % ophthalmic emulsion, Place 1 drop into both eyes 2 (two) times daily.  , Disp: , Rfl: ;  fexofenadine (ALLEGRA) 180 MG tablet, Take 180 mg by mouth daily.  , Disp: , Rfl: ;  finasteride (PROSCAR) 5 MG tablet, Take 5 mg by mouth daily.  , Disp: , Rfl: ;  Glucosamine-Chondroit-Vit C-Mn (GLUCOSAMINE 1500 COMPLEX PO), Take 2 capsules by mouth daily., Disp: , Rfl:  HYDROcodone-acetaminophen (NORCO/VICODIN) 5-325 MG per tablet, Take 1 tablet by mouth every 6 (six) hours as needed for pain., Disp: , Rfl: ;  levothyroxine (SYNTHROID, LEVOTHROID) 150 MCG tablet, Take 150 mcg by mouth daily.  , Disp: , Rfl: ;  mometasone-formoterol (DULERA) 100-5 MCG/ACT AERO, Inhale 2 puffs into the lungs as needed. Rinse mouth, Disp: , Rfl: ;  Multiple Vitamins-Minerals (SENIOR MULTIVITAMIN PLUS PO), Once a day , Disp: , Rfl:  naproxen sodium (ANAPROX) 220 MG tablet, Take 220 mg by mouth 2 (two) times daily as needed. , Disp: , Rfl: ;  nitroGLYCERIN (NITROLINGUAL) 0.4 MG/SPRAY spray, Use as directed as needed , Disp: , Rfl: ;  OMEGA 3 1000 MG CAPS, Take 1 capsule by mouth daily.  , Disp: , Rfl: ;  pantoprazole (PROTONIX) 40 MG tablet, Take 30-60 min before first meal of the day, Disp: , Rfl: ;  Probiotic Product (PROBIOTIC DAILY  PO), Take by mouth., Disp: , Rfl:  ramipril (ALTACE) 5 MG capsule, Take 1 capsule by mouth daily., Disp: , Rfl: ;  VIAGRA 100 MG tablet, Take 1 tablet by mouth Once daily as needed., Disp: , Rfl:   Objective:   Physical Exam General- Alert, Oriented, Affect-appropriate, Distress- none acute Skin- rash-none, lesions- none, excoriation- none Lymphadenopathy- none Head- atraumatic            Eyes- Gross vision intact, PERRLA, conjunctivae and secretions clear            Ears- Hearing, canals-normal            Nose- Clear, no-Septal dev, mucus, polyps, erosion, perforation             Throat- Mallampati II , mucosa clear , drainage- none, tonsils- atrophic Neck- flexible , trachea midline, no stridor , thyroid nl, carotid no bruit Chest - symmetrical excursion , unlabored           Heart/CV- RRR , no murmur , no gallop  , no rub, nl s1 s2                           - JVD-  none , edema- none, stasis changes- none, varices- none           Lung- clear to P&A, wheeze- none, cough- none , dullness-none, rub- none           Chest wall-  Abd-  Br/ Gen/ Rectal- Not done, not indicated Extrem- cyanosis- none, clubbing, none, atrophy- none, strength- nl Neuro- grossly intact to observation       Assessment & Plan:

## 2012-08-18 NOTE — Assessment & Plan Note (Signed)
Even though he is clinically better with the levofloxacin his throat he is significantly reduced compared to baseline by at least 200 cc. Therefore we'll do a short course of oral prednisone. He is agreeable to this plan

## 2012-08-24 DIAGNOSIS — M4712 Other spondylosis with myelopathy, cervical region: Secondary | ICD-10-CM | POA: Diagnosis not present

## 2012-08-24 DIAGNOSIS — M47812 Spondylosis without myelopathy or radiculopathy, cervical region: Secondary | ICD-10-CM | POA: Diagnosis not present

## 2012-08-26 ENCOUNTER — Ambulatory Visit (INDEPENDENT_AMBULATORY_CARE_PROVIDER_SITE_OTHER): Payer: Medicare Other

## 2012-08-26 DIAGNOSIS — J309 Allergic rhinitis, unspecified: Secondary | ICD-10-CM

## 2012-08-30 ENCOUNTER — Ambulatory Visit (INDEPENDENT_AMBULATORY_CARE_PROVIDER_SITE_OTHER): Payer: Medicare Other

## 2012-08-30 DIAGNOSIS — J309 Allergic rhinitis, unspecified: Secondary | ICD-10-CM | POA: Diagnosis not present

## 2012-09-02 ENCOUNTER — Ambulatory Visit: Payer: Medicare Other

## 2012-09-06 ENCOUNTER — Ambulatory Visit: Payer: Medicare Other

## 2012-09-07 ENCOUNTER — Ambulatory Visit (INDEPENDENT_AMBULATORY_CARE_PROVIDER_SITE_OTHER): Payer: Medicare Other

## 2012-09-07 DIAGNOSIS — J309 Allergic rhinitis, unspecified: Secondary | ICD-10-CM | POA: Diagnosis not present

## 2012-09-09 DIAGNOSIS — R42 Dizziness and giddiness: Secondary | ICD-10-CM | POA: Diagnosis not present

## 2012-09-09 DIAGNOSIS — H8110 Benign paroxysmal vertigo, unspecified ear: Secondary | ICD-10-CM | POA: Diagnosis not present

## 2012-09-09 DIAGNOSIS — I69998 Other sequelae following unspecified cerebrovascular disease: Secondary | ICD-10-CM | POA: Diagnosis not present

## 2012-09-09 DIAGNOSIS — B370 Candidal stomatitis: Secondary | ICD-10-CM | POA: Diagnosis not present

## 2012-09-14 ENCOUNTER — Ambulatory Visit (INDEPENDENT_AMBULATORY_CARE_PROVIDER_SITE_OTHER): Payer: Medicare Other

## 2012-09-14 DIAGNOSIS — J309 Allergic rhinitis, unspecified: Secondary | ICD-10-CM

## 2012-09-21 ENCOUNTER — Ambulatory Visit (INDEPENDENT_AMBULATORY_CARE_PROVIDER_SITE_OTHER): Payer: Medicare Other

## 2012-09-21 DIAGNOSIS — J309 Allergic rhinitis, unspecified: Secondary | ICD-10-CM | POA: Diagnosis not present

## 2012-09-28 ENCOUNTER — Ambulatory Visit (INDEPENDENT_AMBULATORY_CARE_PROVIDER_SITE_OTHER): Payer: Medicare Other

## 2012-09-28 DIAGNOSIS — J309 Allergic rhinitis, unspecified: Secondary | ICD-10-CM | POA: Diagnosis not present

## 2012-10-05 ENCOUNTER — Ambulatory Visit (INDEPENDENT_AMBULATORY_CARE_PROVIDER_SITE_OTHER): Payer: Medicare Other

## 2012-10-05 DIAGNOSIS — J309 Allergic rhinitis, unspecified: Secondary | ICD-10-CM

## 2012-10-11 ENCOUNTER — Ambulatory Visit (INDEPENDENT_AMBULATORY_CARE_PROVIDER_SITE_OTHER): Payer: Medicare Other

## 2012-10-11 DIAGNOSIS — J309 Allergic rhinitis, unspecified: Secondary | ICD-10-CM

## 2012-10-11 DIAGNOSIS — M47812 Spondylosis without myelopathy or radiculopathy, cervical region: Secondary | ICD-10-CM | POA: Diagnosis not present

## 2012-10-11 DIAGNOSIS — M542 Cervicalgia: Secondary | ICD-10-CM | POA: Diagnosis not present

## 2012-10-12 ENCOUNTER — Ambulatory Visit: Payer: Medicare Other

## 2012-10-18 ENCOUNTER — Ambulatory Visit (INDEPENDENT_AMBULATORY_CARE_PROVIDER_SITE_OTHER): Payer: Medicare Other

## 2012-10-18 DIAGNOSIS — J309 Allergic rhinitis, unspecified: Secondary | ICD-10-CM | POA: Diagnosis not present

## 2012-10-19 ENCOUNTER — Ambulatory Visit: Payer: Medicare Other

## 2012-10-26 ENCOUNTER — Ambulatory Visit (INDEPENDENT_AMBULATORY_CARE_PROVIDER_SITE_OTHER): Payer: Medicare Other

## 2012-10-26 DIAGNOSIS — J309 Allergic rhinitis, unspecified: Secondary | ICD-10-CM | POA: Diagnosis not present

## 2012-11-01 ENCOUNTER — Ambulatory Visit (INDEPENDENT_AMBULATORY_CARE_PROVIDER_SITE_OTHER): Payer: Medicare Other

## 2012-11-01 DIAGNOSIS — J309 Allergic rhinitis, unspecified: Secondary | ICD-10-CM

## 2012-11-08 ENCOUNTER — Ambulatory Visit (INDEPENDENT_AMBULATORY_CARE_PROVIDER_SITE_OTHER): Payer: Medicare Other

## 2012-11-08 DIAGNOSIS — J309 Allergic rhinitis, unspecified: Secondary | ICD-10-CM

## 2012-11-15 ENCOUNTER — Ambulatory Visit: Payer: Medicare Other

## 2012-11-17 ENCOUNTER — Ambulatory Visit (INDEPENDENT_AMBULATORY_CARE_PROVIDER_SITE_OTHER): Payer: Medicare Other

## 2012-11-17 DIAGNOSIS — J309 Allergic rhinitis, unspecified: Secondary | ICD-10-CM | POA: Diagnosis not present

## 2012-11-23 ENCOUNTER — Ambulatory Visit (INDEPENDENT_AMBULATORY_CARE_PROVIDER_SITE_OTHER): Payer: Medicare Other

## 2012-11-23 DIAGNOSIS — J309 Allergic rhinitis, unspecified: Secondary | ICD-10-CM | POA: Diagnosis not present

## 2012-11-26 DIAGNOSIS — I69998 Other sequelae following unspecified cerebrovascular disease: Secondary | ICD-10-CM | POA: Diagnosis not present

## 2012-11-30 ENCOUNTER — Ambulatory Visit (INDEPENDENT_AMBULATORY_CARE_PROVIDER_SITE_OTHER): Payer: Medicare Other

## 2012-11-30 DIAGNOSIS — J309 Allergic rhinitis, unspecified: Secondary | ICD-10-CM

## 2012-12-03 DIAGNOSIS — I69998 Other sequelae following unspecified cerebrovascular disease: Secondary | ICD-10-CM | POA: Diagnosis not present

## 2012-12-03 DIAGNOSIS — R42 Dizziness and giddiness: Secondary | ICD-10-CM | POA: Diagnosis not present

## 2012-12-07 ENCOUNTER — Ambulatory Visit (INDEPENDENT_AMBULATORY_CARE_PROVIDER_SITE_OTHER): Payer: Medicare Other

## 2012-12-07 DIAGNOSIS — J309 Allergic rhinitis, unspecified: Secondary | ICD-10-CM | POA: Diagnosis not present

## 2012-12-08 ENCOUNTER — Ambulatory Visit: Payer: Medicare Other

## 2012-12-14 ENCOUNTER — Ambulatory Visit (INDEPENDENT_AMBULATORY_CARE_PROVIDER_SITE_OTHER): Payer: Medicare Other

## 2012-12-14 DIAGNOSIS — I2789 Other specified pulmonary heart diseases: Secondary | ICD-10-CM | POA: Diagnosis not present

## 2012-12-20 DIAGNOSIS — Z006 Encounter for examination for normal comparison and control in clinical research program: Secondary | ICD-10-CM | POA: Diagnosis not present

## 2012-12-20 DIAGNOSIS — E669 Obesity, unspecified: Secondary | ICD-10-CM | POA: Diagnosis not present

## 2012-12-20 DIAGNOSIS — M76899 Other specified enthesopathies of unspecified lower limb, excluding foot: Secondary | ICD-10-CM | POA: Diagnosis not present

## 2012-12-20 DIAGNOSIS — M79609 Pain in unspecified limb: Secondary | ICD-10-CM | POA: Diagnosis not present

## 2012-12-20 DIAGNOSIS — M542 Cervicalgia: Secondary | ICD-10-CM | POA: Diagnosis not present

## 2012-12-21 ENCOUNTER — Ambulatory Visit (INDEPENDENT_AMBULATORY_CARE_PROVIDER_SITE_OTHER): Payer: Medicare Other

## 2012-12-21 DIAGNOSIS — J309 Allergic rhinitis, unspecified: Secondary | ICD-10-CM | POA: Diagnosis not present

## 2012-12-28 ENCOUNTER — Ambulatory Visit (INDEPENDENT_AMBULATORY_CARE_PROVIDER_SITE_OTHER): Payer: Medicare Other

## 2012-12-28 DIAGNOSIS — J309 Allergic rhinitis, unspecified: Secondary | ICD-10-CM | POA: Diagnosis not present

## 2013-01-04 ENCOUNTER — Ambulatory Visit (INDEPENDENT_AMBULATORY_CARE_PROVIDER_SITE_OTHER): Payer: Medicare Other

## 2013-01-04 DIAGNOSIS — Z23 Encounter for immunization: Secondary | ICD-10-CM | POA: Diagnosis not present

## 2013-01-04 DIAGNOSIS — J309 Allergic rhinitis, unspecified: Secondary | ICD-10-CM

## 2013-01-06 ENCOUNTER — Encounter: Payer: Self-pay | Admitting: Internal Medicine

## 2013-01-06 ENCOUNTER — Ambulatory Visit (INDEPENDENT_AMBULATORY_CARE_PROVIDER_SITE_OTHER): Payer: Medicare Other | Admitting: Internal Medicine

## 2013-01-06 ENCOUNTER — Ambulatory Visit (INDEPENDENT_AMBULATORY_CARE_PROVIDER_SITE_OTHER)
Admission: RE | Admit: 2013-01-06 | Discharge: 2013-01-06 | Disposition: A | Discharge status: Home / Self Care | Payer: Medicare Other | Source: Ambulatory Visit | Attending: Internal Medicine | Admitting: Internal Medicine

## 2013-01-06 VITALS — BP 104/68 | HR 102 | Ht 70.5 in | Wt 269.2 lb

## 2013-01-06 DIAGNOSIS — J45909 Unspecified asthma, uncomplicated: Secondary | ICD-10-CM | POA: Diagnosis not present

## 2013-01-06 DIAGNOSIS — J449 Chronic obstructive pulmonary disease, unspecified: Secondary | ICD-10-CM | POA: Diagnosis not present

## 2013-01-06 DIAGNOSIS — J45998 Other asthma: Secondary | ICD-10-CM

## 2013-01-06 DIAGNOSIS — J301 Allergic rhinitis due to pollen: Secondary | ICD-10-CM

## 2013-01-06 DIAGNOSIS — J4489 Other specified chronic obstructive pulmonary disease: Secondary | ICD-10-CM

## 2013-01-06 NOTE — Patient Instructions (Addendum)
Order- CXR                 dx COPD  We can continue allergy vaccine 1:10 GH  Please call as needed

## 2013-01-06 NOTE — Progress Notes (Signed)
Subjective:    Patient ID: [name], male    DOB: [birth year], 72 y.o.   MRN: [id number]  HPI 09/30/10- 51 yoM former smoker followed for asthma/ COPD, allergic rhinitis .   She has a 1-  2 yoM former smoker followed for asthma/ COPD, allergic rhinitis ACUTE VISIT:Increased congestion in chest and wheezing, also would like to discuss when to take Allegra. Still wheezing in the mornings some, after seeing nurse practitioner August 16. No infection. He thinks he is more easily short of breath with exertion but admits he is walking less regularly and may have gained some weight. He wants to blame increased cavities and dental problems on his inhalers. We discussed dry mouth. He has started using biotene some. Uses albuterol rescue inhaler infrequently. Continues allergy vaccine 1:10 GH and denies sneezing, nasal congestion or rhinorrhea. Office spirometry- 01/02/12- suboptimal effort ACE based on shape of curve: Mild obstructive airways disease. FVC 3.66/82%, FEV1 2.43/71%, FEV1/FEC 0.66/87%, FEF 25-75% 1.33/44% predicted. CXR 12/12/11-  IMPRESSION:  No active cardiopulmonary disease.  Original Report Authenticated By: Danae Orleans, M.D.   03/17/2012 f/u ov/Wert -cc loosing ground since Sept 2013 on allergy shots  Weekly symptoms went from bad to worse x one week Lots of chest congestion, coughing with production of white mucus already started  on zpak - maintained on dulera but technique not aoptimal, not using any rescue but sob minimal over baseline.  Cough worse  At hs.  Assoc with nasal congestion not responding to allegra and sense of pnds. Denies any obvious fluctuation of symptoms with weather or environmental changes or other aggravating or alleviating factors except as outlined above   03/23/12- 70 yoM former smoker followed for asthma/ COPD, allergic rhinitis He called the office today complaining of head congestion with ear pressure and we brought him in specifically to get  nebulizer treatment and Depo-Medrol for presumed eustachian dysfunction.  07/01/12- 40 yoM former smoker followed for asthma/ COPD, allergic rhinitis FOLLOWS FOR: still on allergy vaccine 1:10 GH and doing well; denies any troubles with allergies at this time. Doing so well that he occasionally forgets a Dulera dose. Never needs rescue inhaler.   Please call as needed  We can continue allergy vaccine 1:1`0 GH   ACUTE OV 08/18/2012 - This is an acute office visit for patient with asthma/allergies/COPD being followed by Dr. Joni Fears young - A week ago he started having insidious onset of laryngitis. On 08/13/2012 he called into the answering service and complained of symptoms of laryngitis and green sputum. I called in 5 days of Levaquin which she completed yesterday. This has helped his symptoms immensely. However, he feels that he is still not back to baseline. This is interfering with his son and to the church due to  coarse voice. In addition he has significant fatigue and his sputum though not [place] Si is now yellow although the volume is reduced. He denies any fever, shortness of breath or wheezing that is out of proportion to her normal range. He is wondering about a repeat  antibiotic or doing a first course of steroids - Spirometry today April 23rd 2014 shows FEV1 2.26 L/65%. And a ratio 70. The FEV1 is significantly down compared to baseline  01/06/13- 71 yoM former smoker (3 ppd/99 pkyr) followed for asthma/ COPD, allergic rhinitis FOLLOWS NWG:NFAO wkly Allergy vaccine 1:10 GH, doing well.Occass. cough-white,denies wheezing and sob Has had flu vaccine. Reports breathing "good" and says walking is limited more by bursitis in his left  thigh. Little cough or wheeze.  ROS-see HPI Constitutional:   No-   weight loss, night sweats, fevers, chills, fatigue, lassitude. HEENT:   No-  headaches, difficulty swallowing, tooth/dental problems, sore throat,       No-  sneezing, itching, ear ache, nasal  congestion, post nasal drip,  CV:  No-   chest pain, orthopnea, PND, swelling in lower extremities, anasarca, dizziness, palpitations Resp: No-   shortness of breath with exertion or at rest.              No-   productive cough,  No non-productive cough,  No- coughing up of blood.              No-   change in color of mucus.  No- wheezing.   Skin: No-   rash or lesions. GI:  No-   heartburn, indigestion, abdominal pain, nausea, vomiting, GU:  MS:  +  joint pain or swelling.   Neuro-     nothing unusual Psych:  No- change in mood or affect. No depression or anxiety.  No memory loss.   Objective:   Physical Exam General- Alert, Oriented, Affect-appropriate, Distress- none acute Skin- rash-none, lesions- none, excoriation- none Lymphadenopathy- none Head- atraumatic            Eyes- Gross vision intact, PERRLA, conjunctivae and secretions clear            Ears- Hearing, canals-normal            Nose- Clear, no-Septal dev, mucus, polyps, erosion, perforation             Throat- Mallampati II , mucosa clear , drainage- none, tonsils- atrophic Neck- flexible , trachea midline, no stridor , thyroid nl, carotid no bruit Chest - symmetrical excursion , unlabored           Heart/CV- RRR , no murmur , no gallop  , no rub, nl s1 s2                           - JVD- none , edema- none, stasis changes- none, varices- none           Lung- clear to P&A, wheeze- none, cough- none , dullness-none, rub- none           Chest wall-  Abd-  Br/ Gen/ Rectal- Not done, not indicated Extrem- cyanosis- none, clubbing, none, atrophy- none, strength- nl Neuro- grossly intact to observation   Assessment & Plan:

## 2013-01-11 ENCOUNTER — Ambulatory Visit: Payer: Medicare Other

## 2013-01-13 ENCOUNTER — Ambulatory Visit (INDEPENDENT_AMBULATORY_CARE_PROVIDER_SITE_OTHER): Payer: Medicare Other

## 2013-01-13 DIAGNOSIS — J309 Allergic rhinitis, unspecified: Secondary | ICD-10-CM

## 2013-01-13 DIAGNOSIS — M25559 Pain in unspecified hip: Secondary | ICD-10-CM | POA: Diagnosis not present

## 2013-01-13 DIAGNOSIS — I10 Essential (primary) hypertension: Secondary | ICD-10-CM | POA: Diagnosis not present

## 2013-01-13 DIAGNOSIS — M169 Osteoarthritis of hip, unspecified: Secondary | ICD-10-CM | POA: Diagnosis not present

## 2013-01-13 DIAGNOSIS — M1610 Unilateral primary osteoarthritis, unspecified hip: Secondary | ICD-10-CM | POA: Diagnosis not present

## 2013-01-15 NOTE — Assessment & Plan Note (Signed)
He is satisfied to continue allergy vaccine 

## 2013-01-15 NOTE — Assessment & Plan Note (Signed)
There is not a lot of bronchospasm recently. Good control.

## 2013-01-15 NOTE — Assessment & Plan Note (Signed)
Significant past smoking history. Plan-chest x-ray

## 2013-01-18 ENCOUNTER — Ambulatory Visit (INDEPENDENT_AMBULATORY_CARE_PROVIDER_SITE_OTHER): Payer: Medicare Other

## 2013-01-18 DIAGNOSIS — J309 Allergic rhinitis, unspecified: Secondary | ICD-10-CM

## 2013-01-18 NOTE — Progress Notes (Signed)
Quick Note:  LMTCB ______ 

## 2013-01-25 ENCOUNTER — Ambulatory Visit (INDEPENDENT_AMBULATORY_CARE_PROVIDER_SITE_OTHER): Payer: Medicare Other

## 2013-01-25 DIAGNOSIS — J309 Allergic rhinitis, unspecified: Secondary | ICD-10-CM

## 2013-01-27 DIAGNOSIS — E7800 Pure hypercholesterolemia, unspecified: Secondary | ICD-10-CM | POA: Diagnosis not present

## 2013-01-28 DIAGNOSIS — M25559 Pain in unspecified hip: Secondary | ICD-10-CM | POA: Diagnosis not present

## 2013-02-01 ENCOUNTER — Ambulatory Visit (INDEPENDENT_AMBULATORY_CARE_PROVIDER_SITE_OTHER): Payer: Medicare Other

## 2013-02-01 DIAGNOSIS — J309 Allergic rhinitis, unspecified: Secondary | ICD-10-CM | POA: Diagnosis not present

## 2013-02-03 DIAGNOSIS — J310 Chronic rhinitis: Secondary | ICD-10-CM | POA: Diagnosis not present

## 2013-02-03 DIAGNOSIS — K5900 Constipation, unspecified: Secondary | ICD-10-CM | POA: Diagnosis not present

## 2013-02-03 DIAGNOSIS — M76899 Other specified enthesopathies of unspecified lower limb, excluding foot: Secondary | ICD-10-CM | POA: Diagnosis not present

## 2013-02-03 DIAGNOSIS — E669 Obesity, unspecified: Secondary | ICD-10-CM | POA: Diagnosis not present

## 2013-02-08 ENCOUNTER — Ambulatory Visit (INDEPENDENT_AMBULATORY_CARE_PROVIDER_SITE_OTHER): Payer: Medicare Other

## 2013-02-08 DIAGNOSIS — J309 Allergic rhinitis, unspecified: Secondary | ICD-10-CM | POA: Diagnosis not present

## 2013-02-16 ENCOUNTER — Ambulatory Visit (INDEPENDENT_AMBULATORY_CARE_PROVIDER_SITE_OTHER): Payer: Medicare Other

## 2013-02-16 DIAGNOSIS — J309 Allergic rhinitis, unspecified: Secondary | ICD-10-CM | POA: Diagnosis not present

## 2013-02-22 ENCOUNTER — Ambulatory Visit (INDEPENDENT_AMBULATORY_CARE_PROVIDER_SITE_OTHER): Payer: Medicare Other

## 2013-02-22 DIAGNOSIS — J309 Allergic rhinitis, unspecified: Secondary | ICD-10-CM

## 2013-03-01 ENCOUNTER — Ambulatory Visit (INDEPENDENT_AMBULATORY_CARE_PROVIDER_SITE_OTHER): Payer: Medicare Other

## 2013-03-01 DIAGNOSIS — J309 Allergic rhinitis, unspecified: Secondary | ICD-10-CM

## 2013-03-08 ENCOUNTER — Ambulatory Visit (INDEPENDENT_AMBULATORY_CARE_PROVIDER_SITE_OTHER): Payer: Medicare Other

## 2013-03-08 DIAGNOSIS — J309 Allergic rhinitis, unspecified: Secondary | ICD-10-CM

## 2013-03-15 ENCOUNTER — Ambulatory Visit (INDEPENDENT_AMBULATORY_CARE_PROVIDER_SITE_OTHER): Payer: Medicare Other

## 2013-03-15 DIAGNOSIS — J309 Allergic rhinitis, unspecified: Secondary | ICD-10-CM | POA: Diagnosis not present

## 2013-03-17 DIAGNOSIS — I2510 Atherosclerotic heart disease of native coronary artery without angina pectoris: Secondary | ICD-10-CM | POA: Diagnosis not present

## 2013-03-17 DIAGNOSIS — E669 Obesity, unspecified: Secondary | ICD-10-CM | POA: Diagnosis not present

## 2013-03-22 ENCOUNTER — Ambulatory Visit (INDEPENDENT_AMBULATORY_CARE_PROVIDER_SITE_OTHER): Payer: Medicare Other

## 2013-03-22 DIAGNOSIS — J309 Allergic rhinitis, unspecified: Secondary | ICD-10-CM

## 2013-03-29 ENCOUNTER — Ambulatory Visit (INDEPENDENT_AMBULATORY_CARE_PROVIDER_SITE_OTHER): Payer: Medicare Other

## 2013-03-29 DIAGNOSIS — J309 Allergic rhinitis, unspecified: Secondary | ICD-10-CM | POA: Diagnosis not present

## 2013-04-04 ENCOUNTER — Ambulatory Visit (INDEPENDENT_AMBULATORY_CARE_PROVIDER_SITE_OTHER): Payer: Medicare Other

## 2013-04-04 DIAGNOSIS — J309 Allergic rhinitis, unspecified: Secondary | ICD-10-CM | POA: Diagnosis not present

## 2013-04-05 ENCOUNTER — Ambulatory Visit (INDEPENDENT_AMBULATORY_CARE_PROVIDER_SITE_OTHER): Payer: Medicare Other

## 2013-04-05 DIAGNOSIS — J309 Allergic rhinitis, unspecified: Secondary | ICD-10-CM | POA: Diagnosis not present

## 2013-04-07 ENCOUNTER — Encounter: Payer: Self-pay | Admitting: Internal Medicine

## 2013-04-12 ENCOUNTER — Ambulatory Visit (INDEPENDENT_AMBULATORY_CARE_PROVIDER_SITE_OTHER): Payer: Medicare Other

## 2013-04-12 DIAGNOSIS — J309 Allergic rhinitis, unspecified: Secondary | ICD-10-CM

## 2013-04-19 ENCOUNTER — Ambulatory Visit (INDEPENDENT_AMBULATORY_CARE_PROVIDER_SITE_OTHER): Payer: Medicare Other

## 2013-04-19 DIAGNOSIS — J309 Allergic rhinitis, unspecified: Secondary | ICD-10-CM | POA: Diagnosis not present

## 2013-04-22 ENCOUNTER — Encounter: Payer: Self-pay | Admitting: Internal Medicine

## 2013-04-22 ENCOUNTER — Telehealth: Payer: Self-pay | Admitting: Internal Medicine

## 2013-04-22 ENCOUNTER — Encounter (INDEPENDENT_AMBULATORY_CARE_PROVIDER_SITE_OTHER): Payer: Self-pay

## 2013-04-22 ENCOUNTER — Ambulatory Visit (INDEPENDENT_AMBULATORY_CARE_PROVIDER_SITE_OTHER): Payer: Medicare Other | Admitting: Internal Medicine

## 2013-04-22 VITALS — BP 112/66 | HR 101 | Ht 70.5 in | Wt 240.6 lb

## 2013-04-22 DIAGNOSIS — J45909 Unspecified asthma, uncomplicated: Secondary | ICD-10-CM

## 2013-04-22 DIAGNOSIS — J45998 Other asthma: Secondary | ICD-10-CM

## 2013-04-22 DIAGNOSIS — R220 Localized swelling, mass and lump, head: Secondary | ICD-10-CM | POA: Diagnosis not present

## 2013-04-22 DIAGNOSIS — J45901 Unspecified asthma with (acute) exacerbation: Secondary | ICD-10-CM | POA: Diagnosis not present

## 2013-04-22 DIAGNOSIS — J449 Chronic obstructive pulmonary disease, unspecified: Secondary | ICD-10-CM | POA: Diagnosis not present

## 2013-04-22 MED ORDER — BUDESONIDE-FORMOTEROL FUMARATE 160-4.5 MCG/ACT IN AERO: INHALATION_SPRAY | RESPIRATORY_TRACT | Status: DC

## 2013-04-22 MED ORDER — LEVALBUTEROL HCL 0.63 MG/3ML IN NEBU
0.6300 mg | INHALATION_SOLUTION | Freq: Once | RESPIRATORY_TRACT | Status: AC
Start: 2013-04-22 — End: 2013-04-22
  Administered 2013-04-22: 0.63 mg via RESPIRATORY_TRACT

## 2013-04-22 MED ORDER — METHYLPREDNISOLONE ACETATE 80 MG/ML IJ SUSP
80.0000 mg | Freq: Once | INTRAMUSCULAR | Status: AC
  Administered 2013-04-22: 80 mg via INTRAMUSCULAR

## 2013-04-22 MED ORDER — MOXIFLOXACIN HCL 400 MG PO TABS: 400.0000 mg | ORAL_TABLET | Freq: Every day | ORAL | Status: AC

## 2013-04-22 NOTE — Telephone Encounter (Signed)
Called, spoke with pt.  He has a few concerns: 1.  C/o congestion in "upper bronchial area" x 1 wks.  Has a nonprod cough, wheezing - worse when laying down, and has felt feverish with chills.  Finished amoxicillin this am given by PCP.  Still has a lot of congestion.  Also started symbicort bid with this episode (this was a sample given to pt "approx 1 yr ago").   2.  States yesterday morning he noticed a swelling area on left side of face and under chin.  Wife describes this as a "chipmonk pouch" going from earlobe to chin and under jaw."  Reports area is approx the size of a golf ball, is "puffy", slightly red, no increased warmth.  Area has gotten larger since starting yesterday am.    Pt is requesting OV - we have scheduled him to see Dr. Maple Hudson at 9:30 am today.  Wife aware and voiced no further questions or concerns at this time.

## 2013-04-22 NOTE — Patient Instructions (Signed)
Neb xop 0.3  Depo 80  Ok to continue Symibicort 160   2 puffs then rinse mouth twice daily    Script sent  Script for Avelox sent  Please let us know if the swelling under your jaw doesn't go away.

## 2013-04-22 NOTE — Progress Notes (Signed)
Subjective:    Patient ID: [name], male    DOB: March 04, 1941, 72 y.o.   MRN: [id number]  HPI 09/30/10- 18 yoM former smoker followed for asthma/ COPD, allergic rhinitis .    5 yoM former smoker followed for asthma/ COPD, allergic rhinitis ACUTE VISIT:Increased congestion in chest and wheezing, also would like to discuss when to take [place]. Still wheezing in the mornings some, after seeing nurse practitioner August 16. No infection. He thinks he is more easily short of breath with exertion but admits he is walking less regularly and may have gained some weight. He wants to blame increased cavities and dental problems on his inhalers. We discussed dry mouth. He has started using biotene some. Uses albuterol rescue inhaler infrequently. Continues allergy vaccine 1:10 GH and denies sneezing, nasal congestion or rhinorrhea. Office spirometry- 01/02/12- suboptimal effort ACE based on shape of curve: Mild obstructive airways disease. FVC 3.66/82%, FEV1 2.43/71%, FEV1/FEC 0.66/87%, FEF 25-75% 1.33/44% predicted. CXR 12/12/11-  IMPRESSION:  No active cardiopulmonary disease.  Original Report Authenticated By: Marlaine Hind, M.D.   03/17/2012 f/u ov/Wert -cc loosing ground since Sept 2013 on allergy shots  Weekly symptoms went from bad to worse x one week Lots of chest congestion, coughing with production of white mucus already started  on zpak - maintained on dulera but technique not aoptimal, not using any rescue but sob minimal over baseline.  Cough worse  At hs.  Assoc with nasal congestion not responding to allegra and sense of pnds. Denies any obvious fluctuation of symptoms with weather or environmental changes or other aggravating or alleviating factors except as outlined above   03/23/12- 70 yoM former smoker followed for asthma/ COPD, allergic rhinitis He called the office today complaining of head congestion with ear pressure and we brought him in specifically to get nebulizer treatment  and Depo-Medrol for presumed eustachian dysfunction.  07/01/12- 27 yoM former smoker followed for asthma/ COPD, allergic rhinitis FOLLOWS FOR: still on allergy vaccine 1:10 GH and doing well; denies any troubles with allergies at this time. Doing so well that he occasionally forgets a Dulera dose. Never needs rescue inhaler.   Please call as needed  We can continue allergy vaccine 1:[address]   ACUTE OV 08/18/2012 - This is an acute office visit for patient with asthma/allergies/COPD being followed by Dr. Tarri Fuller Kailin Leu - A week ago he started having insidious onset of laryngitis. On 08/13/2012 he called into the answering service and complained of symptoms of laryngitis and green sputum. I called in 5 days of Levaquin which she completed yesterday. This has helped his symptoms immensely. However, he feels that he is still not back to baseline. This is interfering with his son and to the church due to  coarse voice. In addition he has significant fatigue and his sputum though not [place]oka Cowden is now yellow although the volume is reduced. He denies any fever, shortness of breath or wheezing that is out of proportion to her normal range. He is wondering about a repeat  antibiotic or doing a first course of steroids - Spirometry today April 23rd 2014 shows FEV1 2.26 L/65%. And a ratio .70. The FEV1 is significantly down compared to baseline  01/06/13- 71 yoM former smoker (3 ppd/99 pkyr) followed for asthma/ COPD, allergic rhinitis FOLLOWS KVQ:QVZD wkly Allergy vaccine 1:10 GH, doing well.Occass. cough-white,denies wheezing and sob Has had flu vaccine. Reports breathing "good" and says walking is limited more by bursitis in his left thigh. Little cough or  wheeze.  04/22/13- 54 yoM former smoker (3 ppd/99 pkyr) followed for asthma/ COPD, allergic rhinitis ACUTE VISIT: swelling near bottom left side of neck/chin area; also having Chest congestion.-started yesterday. Has started using Symbicort and Qsymia. Started  taking Amoxicillin 500 mg TID x 7 days on 04-15-13. Losing weight on phentermine/ Qsymia Rx'd by Dr Renne Crigler. .Bothersome yellow postnasal drip. Started amoxicillin December 19. Residual chest congestion. Complains of swelling left side of mouth and palpable under  left mandible while on amoxicillin. CXR 01/06/13 IMPRESSION:  Mild hyperinflation again noted. Stable right basilar streaky  atelectasis or scarring. No acute infiltrate or pulmonary edema.  Electronically Signed  By: Natasha Mead  On: 01/06/2013 10:18   ROS-see HPI Constitutional:   No-   weight loss, night sweats, fevers, chills, fatigue, lassitude. HEENT:   No-  headaches, difficulty swallowing, tooth/dental problems, sore throat,       No-  sneezing, itching, ear ache, nasal congestion, post nasal drip,  CV:  No-   chest pain, orthopnea, PND, swelling in lower extremities, anasarca, dizziness, palpitations Resp: No-   shortness of breath with exertion or at rest.            + productive cough,  No non-productive cough,  No- coughing up of blood.              No-   change in color of mucus.  No- wheezing.   Skin: No-   rash or lesions. GI:  No-   heartburn, indigestion, abdominal pain, nausea, vomiting, GU:  MS:  +  joint pain or swelling.   Neuro-     nothing unusual Psych:  No- change in mood or affect. No depression or anxiety.  No memory loss.   Objective:   Physical Exam General- Alert, Oriented, Affect-appropriate, Distress- none acute Skin- rash-none, lesions- none, excoriation- none Lymphadenopathy- none Head- atraumatic            Eyes- Gross vision intact, PERRLA, conjunctivae and secretions clear            Ears- Hearing, canals-normal            Nose- Clear, no-Septal dev, mucus, polyps, erosion, perforation             Throat- Mallampati II , mucosa clear , drainage- none, tonsils- atrophic Neck- flexible , trachea midline, no stridor , thyroid nl, carotid no bruit. +tender fullness in the                  left mandible with no separate adenopathy. I don't see anything corresponding inside the            mouth. Chest - symmetrical excursion , unlabored           Heart/CV- RRR , no murmur , no gallop  , no rub, nl s1 s2                           - JVD- none , edema- none, stasis changes- none, varices- none           Lung- clear to P&A, wheeze- none, cough+hoarse cough , dullness-none, rub- none           Chest wall-  Abd-  Br/ Gen/ Rectal- Not done, not indicated Extrem- cyanosis- none, clubbing, none, atrophy- none, strength- nl Neuro- grossly intact to observation   Assessment & Plan:

## 2013-04-25 ENCOUNTER — Telehealth: Payer: Self-pay | Admitting: Internal Medicine

## 2013-04-25 NOTE — Telephone Encounter (Signed)
Called, spoke with pt. Informed him of below per Dr. Maple Hudson.  He verbalized understanding and voiced no further questions or concerns at this time.

## 2013-04-25 NOTE — Telephone Encounter (Signed)
Per Cy-yes it will be fine for him to visit his mother in law.

## 2013-04-25 NOTE — Telephone Encounter (Signed)
I called and spoke with pt. Pt mother in law is currently in [facility] w/ ? Flu. Test results have not come back yet. Pt is currently on ABX and wants to know if it safe for him to visit or when would it be safe to visit. Pt has his been on avelox x last Friday given by Dr. Maple Hudson. Pt denies any fever.  Please advise Dr. Maple Hudson thanks

## 2013-04-26 ENCOUNTER — Ambulatory Visit (INDEPENDENT_AMBULATORY_CARE_PROVIDER_SITE_OTHER): Payer: Medicare Other

## 2013-04-26 DIAGNOSIS — J309 Allergic rhinitis, unspecified: Secondary | ICD-10-CM

## 2013-05-02 ENCOUNTER — Ambulatory Visit (INDEPENDENT_AMBULATORY_CARE_PROVIDER_SITE_OTHER): Payer: Medicare Other

## 2013-05-02 DIAGNOSIS — J309 Allergic rhinitis, unspecified: Secondary | ICD-10-CM | POA: Diagnosis not present

## 2013-05-03 ENCOUNTER — Ambulatory Visit: Payer: Medicare Other

## 2013-05-09 ENCOUNTER — Ambulatory Visit: Payer: Medicare Other

## 2013-05-11 ENCOUNTER — Ambulatory Visit (INDEPENDENT_AMBULATORY_CARE_PROVIDER_SITE_OTHER): Payer: Medicare Other

## 2013-05-11 DIAGNOSIS — J309 Allergic rhinitis, unspecified: Secondary | ICD-10-CM | POA: Diagnosis not present

## 2013-05-15 DIAGNOSIS — R220 Localized swelling, mass and lump, head: Secondary | ICD-10-CM | POA: Insufficient documentation

## 2013-05-15 DIAGNOSIS — R221 Localized swelling, mass and lump, neck: Principal | ICD-10-CM

## 2013-05-15 NOTE — Assessment & Plan Note (Addendum)
Acute bronchitic exacerbation of COPD Plan-finish amoxicillin. Overlap with Avelox

## 2013-05-15 NOTE — Assessment & Plan Note (Signed)
Tender fullness under left mandible. Favor adenitis or inflamed salivary gland but we need to see it resolve. Plan-Avelox. If this fails to clear promptly he'll need ENT/ oral surgery evaluation

## 2013-05-17 ENCOUNTER — Ambulatory Visit: Payer: Medicare Other | Admitting: Cardiology

## 2013-05-17 ENCOUNTER — Ambulatory Visit (INDEPENDENT_AMBULATORY_CARE_PROVIDER_SITE_OTHER): Payer: Medicare Other

## 2013-05-17 DIAGNOSIS — J309 Allergic rhinitis, unspecified: Secondary | ICD-10-CM | POA: Diagnosis not present

## 2013-05-23 ENCOUNTER — Ambulatory Visit (INDEPENDENT_AMBULATORY_CARE_PROVIDER_SITE_OTHER): Payer: Medicare Other

## 2013-05-23 DIAGNOSIS — J309 Allergic rhinitis, unspecified: Secondary | ICD-10-CM

## 2013-05-24 ENCOUNTER — Encounter: Payer: Self-pay | Admitting: Cardiology

## 2013-05-24 ENCOUNTER — Ambulatory Visit (INDEPENDENT_AMBULATORY_CARE_PROVIDER_SITE_OTHER): Payer: Medicare Other | Admitting: Cardiology

## 2013-05-24 ENCOUNTER — Ambulatory Visit: Payer: Medicare Other

## 2013-05-24 VITALS — BP 112/60 | HR 85 | Ht 70.5 in | Wt 226.0 lb

## 2013-05-24 DIAGNOSIS — I2510 Atherosclerotic heart disease of native coronary artery without angina pectoris: Secondary | ICD-10-CM

## 2013-05-24 MED ORDER — NITROGLYCERIN 0.4 MG/SPRAY TL SOLN: 1.0000 | Status: DC | PRN

## 2013-05-24 NOTE — Progress Notes (Signed)
HPI: FU CAD. Patient has had previous PCI of his LAD. His last Echocardiogram in Oct 2009 showed an ejection fraction of 50-55% and trace aortic insufficiency. Myoview in December of 2012 showed an ejection fraction of 49%. There was scar with mild peri-infarct ischemia in the distal anteroseptal and apical wall. We are treating medically. Abdominal ultrasound in January of 2014 showed no aneurysm. I last saw him in Dec 2013. Since then the patient denies any dyspnea on exertion, orthopnea, PND, pedal edema, palpitations, syncope or chest pain.   Current Outpatient Prescriptions  Medication Sig Dispense Refill  . amoxicillin (AMOXIL) 500 MG capsule Prn dental       . Ascorbic Acid (VITAMIN C) 1000 MG tablet Take 1,000 mg by mouth daily.      Marland Kitchen aspirin EC 81 MG tablet Take 81 mg by mouth daily.       Marland Kitchen atorvastatin (LIPITOR) 40 MG tablet Take 40 mg by mouth daily.        . calcipotriene (DOVONOX) 0.005 % cream Apply topically 2 (two) times daily.      . Cholecalciferol (VITAMIN D3) 2000 UNITS TABS Once a day       . cycloSPORINE (RESTASIS) 0.05 % ophthalmic emulsion Place 1 drop into both eyes 2 (two) times daily.        . fexofenadine (ALLEGRA) 180 MG tablet Take 180 mg by mouth daily.        . finasteride (PROSCAR) 5 MG tablet Take 5 mg by mouth daily.        . Glucosamine-Chondroit-Vit C-Mn (GLUCOSAMINE 1500 COMPLEX PO) Take 2 capsules by mouth daily.      . irbesartan (AVAPRO) 300 MG tablet Take 300 mg by mouth daily.      Marland Kitchen levothyroxine (SYNTHROID, LEVOTHROID) 150 MCG tablet Take 150 mcg by mouth daily.        . Multiple Vitamins-Minerals (SENIOR MULTIVITAMIN PLUS PO) Once a day       . naproxen sodium (ANAPROX) 220 MG tablet Take 220 mg by mouth daily.       . nitroGLYCERIN (NITROLINGUAL) 0.4 MG/SPRAY spray Use as directed as needed       . pantoprazole (PROTONIX) 40 MG tablet Take 30-60 min before first meal of the day      . Phentermine-Topiramate (QSYMIA) 7.5-46 MG CP24 Take  by mouth. Daily by mouth-1 tablet.       No current facility-administered medications for this visit.     Past Medical History  Diagnosis Date  . Hyperlipidemia   . Hiatal hernia   . GERD (gastroesophageal reflux disease)   . Hypothyroidism   . Allergic asthma   . COPD (chronic obstructive pulmonary disease)   . CAD (coronary artery disease)   . BPH (benign prostatic hyperplasia)   . Psoriasis     Past Surgical History  Procedure Laterality Date  . Left knee arthroscopy    . Tonsillectomy    . Nasal sinus surgery      History   Social History  . Marital Status: Married    Spouse Name: N/A    Number of Children: N/A  . Years of Education: N/A   Occupational History  . Minister    Social History Main Topics  . Smoking status: Former Smoker -- 3.00 packs/day for 33 years    Types: Pipe, Cigars    Quit date: 04/28/1986  . Smokeless tobacco: Not on file  . Alcohol Use: Yes  . Drug Use:  No  . Sexual Activity: Not on file   Other Topics Concern  . Not on file   Social History Narrative  . No narrative on file    ROS: no fevers or chills, productive cough, hemoptysis, dysphasia, odynophagia, melena, hematochezia, dysuria, hematuria, rash, seizure activity, orthopnea, PND, pedal edema, claudication. Remaining systems are negative.  Physical Exam: Well-developed well-nourished in no acute distress.  Skin is warm and dry.  HEENT is normal.  Neck is supple.  Chest is clear to auscultation with normal expansion.  Cardiovascular exam is regular rate and rhythm.  Abdominal exam nontender or distended. No masses palpated. Extremities show no edema. neuro grossly intact  ECG sinus rhythm at a rate of 85. Right bundle branch block. Left ventricular hypertrophy. Cannot rule out prior septal infarct.

## 2013-05-24 NOTE — Assessment & Plan Note (Signed)
Continue aspirin and statin. 

## 2013-05-24 NOTE — Patient Instructions (Signed)
Your physician wants you to follow-up in: ONE YEAR WITH DR CRENSHAW You will receive a reminder letter in the mail two months in advance. If you don't receive a letter, please call our office to schedule the follow-up appointment.  

## 2013-05-24 NOTE — Assessment & Plan Note (Signed)
Continue statin. Lipids and liver monitored by primary care. 

## 2013-05-27 ENCOUNTER — Encounter: Payer: Self-pay | Admitting: Cardiology

## 2013-05-30 ENCOUNTER — Ambulatory Visit: Payer: Medicare Other

## 2013-05-31 ENCOUNTER — Other Ambulatory Visit: Payer: Self-pay | Admitting: Gastroenterology

## 2013-05-31 DIAGNOSIS — Z86010 Personal history of colonic polyps: Secondary | ICD-10-CM | POA: Diagnosis not present

## 2013-05-31 DIAGNOSIS — K449 Diaphragmatic hernia without obstruction or gangrene: Secondary | ICD-10-CM | POA: Diagnosis not present

## 2013-05-31 DIAGNOSIS — Z09 Encounter for follow-up examination after completed treatment for conditions other than malignant neoplasm: Secondary | ICD-10-CM | POA: Diagnosis not present

## 2013-05-31 DIAGNOSIS — D126 Benign neoplasm of colon, unspecified: Secondary | ICD-10-CM | POA: Diagnosis not present

## 2013-05-31 DIAGNOSIS — K5730 Diverticulosis of large intestine without perforation or abscess without bleeding: Secondary | ICD-10-CM | POA: Diagnosis not present

## 2013-05-31 DIAGNOSIS — K2270 Barrett's esophagus without dysplasia: Secondary | ICD-10-CM | POA: Diagnosis not present

## 2013-06-01 ENCOUNTER — Ambulatory Visit (INDEPENDENT_AMBULATORY_CARE_PROVIDER_SITE_OTHER): Payer: Medicare Other

## 2013-06-01 DIAGNOSIS — J309 Allergic rhinitis, unspecified: Secondary | ICD-10-CM

## 2013-06-02 ENCOUNTER — Telehealth: Payer: Self-pay | Admitting: Cardiology

## 2013-06-02 NOTE — Telephone Encounter (Signed)
Spoke with pt, records updated.

## 2013-06-02 NOTE — Telephone Encounter (Signed)
New message    Want to talk to a nurse to update mychart

## 2013-06-06 ENCOUNTER — Ambulatory Visit (INDEPENDENT_AMBULATORY_CARE_PROVIDER_SITE_OTHER): Payer: Medicare Other

## 2013-06-06 DIAGNOSIS — J309 Allergic rhinitis, unspecified: Secondary | ICD-10-CM | POA: Diagnosis not present

## 2013-06-07 ENCOUNTER — Ambulatory Visit: Payer: Medicare Other

## 2013-06-14 ENCOUNTER — Ambulatory Visit: Payer: Medicare Other

## 2013-06-15 ENCOUNTER — Ambulatory Visit (INDEPENDENT_AMBULATORY_CARE_PROVIDER_SITE_OTHER): Payer: Medicare Other

## 2013-06-15 DIAGNOSIS — J309 Allergic rhinitis, unspecified: Secondary | ICD-10-CM | POA: Diagnosis not present

## 2013-06-20 ENCOUNTER — Ambulatory Visit (INDEPENDENT_AMBULATORY_CARE_PROVIDER_SITE_OTHER): Payer: Medicare Other

## 2013-06-20 DIAGNOSIS — I10 Essential (primary) hypertension: Secondary | ICD-10-CM | POA: Diagnosis not present

## 2013-06-20 DIAGNOSIS — D485 Neoplasm of uncertain behavior of skin: Secondary | ICD-10-CM | POA: Diagnosis not present

## 2013-06-20 DIAGNOSIS — E669 Obesity, unspecified: Secondary | ICD-10-CM | POA: Diagnosis not present

## 2013-06-20 DIAGNOSIS — J309 Allergic rhinitis, unspecified: Secondary | ICD-10-CM | POA: Diagnosis not present

## 2013-06-21 ENCOUNTER — Ambulatory Visit: Payer: Medicare Other

## 2013-06-28 ENCOUNTER — Ambulatory Visit (INDEPENDENT_AMBULATORY_CARE_PROVIDER_SITE_OTHER): Payer: Medicare Other

## 2013-06-28 DIAGNOSIS — J309 Allergic rhinitis, unspecified: Secondary | ICD-10-CM | POA: Diagnosis not present

## 2013-07-05 ENCOUNTER — Ambulatory Visit (INDEPENDENT_AMBULATORY_CARE_PROVIDER_SITE_OTHER): Payer: Medicare Other

## 2013-07-05 DIAGNOSIS — J309 Allergic rhinitis, unspecified: Secondary | ICD-10-CM | POA: Diagnosis not present

## 2013-07-12 ENCOUNTER — Other Ambulatory Visit: Payer: Self-pay | Admitting: Dermatology

## 2013-07-12 ENCOUNTER — Ambulatory Visit (INDEPENDENT_AMBULATORY_CARE_PROVIDER_SITE_OTHER): Payer: Medicare Other

## 2013-07-12 DIAGNOSIS — L821 Other seborrheic keratosis: Secondary | ICD-10-CM | POA: Diagnosis not present

## 2013-07-12 DIAGNOSIS — J309 Allergic rhinitis, unspecified: Secondary | ICD-10-CM | POA: Diagnosis not present

## 2013-07-12 DIAGNOSIS — L408 Other psoriasis: Secondary | ICD-10-CM | POA: Diagnosis not present

## 2013-07-12 DIAGNOSIS — D485 Neoplasm of uncertain behavior of skin: Secondary | ICD-10-CM | POA: Diagnosis not present

## 2013-07-19 ENCOUNTER — Ambulatory Visit (INDEPENDENT_AMBULATORY_CARE_PROVIDER_SITE_OTHER): Payer: Medicare Other

## 2013-07-19 DIAGNOSIS — J309 Allergic rhinitis, unspecified: Secondary | ICD-10-CM | POA: Diagnosis not present

## 2013-07-22 ENCOUNTER — Encounter: Payer: Self-pay | Admitting: Internal Medicine

## 2013-07-26 ENCOUNTER — Ambulatory Visit (INDEPENDENT_AMBULATORY_CARE_PROVIDER_SITE_OTHER): Payer: Medicare Other

## 2013-07-26 DIAGNOSIS — J309 Allergic rhinitis, unspecified: Secondary | ICD-10-CM

## 2013-08-02 ENCOUNTER — Ambulatory Visit (INDEPENDENT_AMBULATORY_CARE_PROVIDER_SITE_OTHER): Payer: Medicare Other

## 2013-08-02 DIAGNOSIS — J309 Allergic rhinitis, unspecified: Secondary | ICD-10-CM | POA: Diagnosis not present

## 2013-08-03 ENCOUNTER — Ambulatory Visit (INDEPENDENT_AMBULATORY_CARE_PROVIDER_SITE_OTHER): Payer: Medicare Other

## 2013-08-03 DIAGNOSIS — J309 Allergic rhinitis, unspecified: Secondary | ICD-10-CM | POA: Diagnosis not present

## 2013-08-08 ENCOUNTER — Ambulatory Visit (INDEPENDENT_AMBULATORY_CARE_PROVIDER_SITE_OTHER): Payer: Medicare Other

## 2013-08-08 DIAGNOSIS — J309 Allergic rhinitis, unspecified: Secondary | ICD-10-CM | POA: Diagnosis not present

## 2013-08-12 DIAGNOSIS — Z23 Encounter for immunization: Secondary | ICD-10-CM | POA: Diagnosis not present

## 2013-08-12 DIAGNOSIS — Z0000 Encounter for general adult medical examination without abnormal findings: Secondary | ICD-10-CM | POA: Diagnosis not present

## 2013-08-12 DIAGNOSIS — I10 Essential (primary) hypertension: Secondary | ICD-10-CM | POA: Diagnosis not present

## 2013-08-12 DIAGNOSIS — E039 Hypothyroidism, unspecified: Secondary | ICD-10-CM | POA: Diagnosis not present

## 2013-08-12 DIAGNOSIS — E7800 Pure hypercholesterolemia, unspecified: Secondary | ICD-10-CM | POA: Diagnosis not present

## 2013-08-16 ENCOUNTER — Ambulatory Visit (INDEPENDENT_AMBULATORY_CARE_PROVIDER_SITE_OTHER): Payer: Medicare Other

## 2013-08-16 DIAGNOSIS — J309 Allergic rhinitis, unspecified: Secondary | ICD-10-CM | POA: Diagnosis not present

## 2013-08-18 DIAGNOSIS — Z9861 Coronary angioplasty status: Secondary | ICD-10-CM | POA: Diagnosis not present

## 2013-08-18 DIAGNOSIS — E039 Hypothyroidism, unspecified: Secondary | ICD-10-CM | POA: Diagnosis not present

## 2013-08-18 DIAGNOSIS — Z1212 Encounter for screening for malignant neoplasm of rectum: Secondary | ICD-10-CM | POA: Diagnosis not present

## 2013-08-18 DIAGNOSIS — E7800 Pure hypercholesterolemia, unspecified: Secondary | ICD-10-CM | POA: Diagnosis not present

## 2013-08-18 DIAGNOSIS — I2510 Atherosclerotic heart disease of native coronary artery without angina pectoris: Secondary | ICD-10-CM | POA: Diagnosis not present

## 2013-08-23 ENCOUNTER — Ambulatory Visit (INDEPENDENT_AMBULATORY_CARE_PROVIDER_SITE_OTHER): Payer: Medicare Other

## 2013-08-23 DIAGNOSIS — J309 Allergic rhinitis, unspecified: Secondary | ICD-10-CM | POA: Diagnosis not present

## 2013-08-30 ENCOUNTER — Ambulatory Visit (INDEPENDENT_AMBULATORY_CARE_PROVIDER_SITE_OTHER): Payer: Medicare Other

## 2013-08-30 DIAGNOSIS — J309 Allergic rhinitis, unspecified: Secondary | ICD-10-CM | POA: Diagnosis not present

## 2013-09-06 ENCOUNTER — Ambulatory Visit (INDEPENDENT_AMBULATORY_CARE_PROVIDER_SITE_OTHER): Payer: Medicare Other

## 2013-09-06 DIAGNOSIS — J309 Allergic rhinitis, unspecified: Secondary | ICD-10-CM | POA: Diagnosis not present

## 2013-09-07 DIAGNOSIS — J069 Acute upper respiratory infection, unspecified: Secondary | ICD-10-CM | POA: Diagnosis not present

## 2013-09-12 ENCOUNTER — Ambulatory Visit (INDEPENDENT_AMBULATORY_CARE_PROVIDER_SITE_OTHER): Payer: Medicare Other

## 2013-09-12 DIAGNOSIS — J309 Allergic rhinitis, unspecified: Secondary | ICD-10-CM

## 2013-09-20 ENCOUNTER — Ambulatory Visit (INDEPENDENT_AMBULATORY_CARE_PROVIDER_SITE_OTHER): Payer: Medicare Other

## 2013-09-20 DIAGNOSIS — J309 Allergic rhinitis, unspecified: Secondary | ICD-10-CM | POA: Diagnosis not present

## 2013-09-27 ENCOUNTER — Ambulatory Visit (INDEPENDENT_AMBULATORY_CARE_PROVIDER_SITE_OTHER): Payer: Medicare Other

## 2013-09-27 DIAGNOSIS — J309 Allergic rhinitis, unspecified: Secondary | ICD-10-CM

## 2013-09-29 DIAGNOSIS — B356 Tinea cruris: Secondary | ICD-10-CM | POA: Diagnosis not present

## 2013-10-04 ENCOUNTER — Ambulatory Visit (INDEPENDENT_AMBULATORY_CARE_PROVIDER_SITE_OTHER): Payer: Medicare Other

## 2013-10-04 DIAGNOSIS — J309 Allergic rhinitis, unspecified: Secondary | ICD-10-CM | POA: Diagnosis not present

## 2013-10-11 ENCOUNTER — Ambulatory Visit: Payer: Medicare Other

## 2013-10-18 ENCOUNTER — Ambulatory Visit (INDEPENDENT_AMBULATORY_CARE_PROVIDER_SITE_OTHER): Payer: Medicare Other

## 2013-10-18 DIAGNOSIS — J309 Allergic rhinitis, unspecified: Secondary | ICD-10-CM

## 2013-10-25 ENCOUNTER — Ambulatory Visit (INDEPENDENT_AMBULATORY_CARE_PROVIDER_SITE_OTHER): Payer: Medicare Other

## 2013-10-25 DIAGNOSIS — J309 Allergic rhinitis, unspecified: Secondary | ICD-10-CM

## 2013-11-01 ENCOUNTER — Ambulatory Visit (INDEPENDENT_AMBULATORY_CARE_PROVIDER_SITE_OTHER): Payer: Medicare Other

## 2013-11-01 DIAGNOSIS — J309 Allergic rhinitis, unspecified: Secondary | ICD-10-CM

## 2013-11-08 ENCOUNTER — Ambulatory Visit (INDEPENDENT_AMBULATORY_CARE_PROVIDER_SITE_OTHER): Payer: Medicare Other

## 2013-11-08 DIAGNOSIS — J309 Allergic rhinitis, unspecified: Secondary | ICD-10-CM

## 2013-11-15 ENCOUNTER — Ambulatory Visit (INDEPENDENT_AMBULATORY_CARE_PROVIDER_SITE_OTHER): Payer: Medicare Other

## 2013-11-15 DIAGNOSIS — J309 Allergic rhinitis, unspecified: Secondary | ICD-10-CM | POA: Diagnosis not present

## 2013-11-22 ENCOUNTER — Ambulatory Visit (INDEPENDENT_AMBULATORY_CARE_PROVIDER_SITE_OTHER): Payer: Medicare Other

## 2013-11-22 DIAGNOSIS — J309 Allergic rhinitis, unspecified: Secondary | ICD-10-CM | POA: Diagnosis not present

## 2013-11-25 ENCOUNTER — Encounter: Payer: Self-pay | Admitting: Internal Medicine

## 2013-11-29 ENCOUNTER — Ambulatory Visit (INDEPENDENT_AMBULATORY_CARE_PROVIDER_SITE_OTHER): Payer: Medicare Other

## 2013-11-29 DIAGNOSIS — J309 Allergic rhinitis, unspecified: Secondary | ICD-10-CM

## 2013-12-06 ENCOUNTER — Ambulatory Visit (INDEPENDENT_AMBULATORY_CARE_PROVIDER_SITE_OTHER): Payer: Medicare Other

## 2013-12-06 DIAGNOSIS — J309 Allergic rhinitis, unspecified: Secondary | ICD-10-CM | POA: Diagnosis not present

## 2013-12-13 ENCOUNTER — Ambulatory Visit (INDEPENDENT_AMBULATORY_CARE_PROVIDER_SITE_OTHER): Payer: Medicare Other

## 2013-12-13 DIAGNOSIS — J309 Allergic rhinitis, unspecified: Secondary | ICD-10-CM | POA: Diagnosis not present

## 2013-12-19 ENCOUNTER — Ambulatory Visit (INDEPENDENT_AMBULATORY_CARE_PROVIDER_SITE_OTHER): Payer: Medicare Other

## 2013-12-19 DIAGNOSIS — J309 Allergic rhinitis, unspecified: Secondary | ICD-10-CM

## 2013-12-19 DIAGNOSIS — E039 Hypothyroidism, unspecified: Secondary | ICD-10-CM | POA: Diagnosis not present

## 2013-12-20 ENCOUNTER — Ambulatory Visit (INDEPENDENT_AMBULATORY_CARE_PROVIDER_SITE_OTHER): Payer: Medicare Other

## 2013-12-20 DIAGNOSIS — J309 Allergic rhinitis, unspecified: Secondary | ICD-10-CM

## 2013-12-22 DIAGNOSIS — Z9861 Coronary angioplasty status: Secondary | ICD-10-CM | POA: Diagnosis not present

## 2013-12-22 DIAGNOSIS — I2510 Atherosclerotic heart disease of native coronary artery without angina pectoris: Secondary | ICD-10-CM | POA: Diagnosis not present

## 2013-12-22 DIAGNOSIS — E039 Hypothyroidism, unspecified: Secondary | ICD-10-CM | POA: Diagnosis not present

## 2013-12-22 DIAGNOSIS — E669 Obesity, unspecified: Secondary | ICD-10-CM | POA: Diagnosis not present

## 2013-12-27 ENCOUNTER — Ambulatory Visit (INDEPENDENT_AMBULATORY_CARE_PROVIDER_SITE_OTHER): Payer: Medicare Other

## 2013-12-27 DIAGNOSIS — J309 Allergic rhinitis, unspecified: Secondary | ICD-10-CM

## 2014-01-03 ENCOUNTER — Ambulatory Visit (INDEPENDENT_AMBULATORY_CARE_PROVIDER_SITE_OTHER): Payer: Medicare Other

## 2014-01-03 DIAGNOSIS — J309 Allergic rhinitis, unspecified: Secondary | ICD-10-CM | POA: Diagnosis not present

## 2014-01-10 ENCOUNTER — Ambulatory Visit (INDEPENDENT_AMBULATORY_CARE_PROVIDER_SITE_OTHER): Payer: Medicare Other

## 2014-01-10 DIAGNOSIS — J309 Allergic rhinitis, unspecified: Secondary | ICD-10-CM | POA: Diagnosis not present

## 2014-01-17 ENCOUNTER — Ambulatory Visit (INDEPENDENT_AMBULATORY_CARE_PROVIDER_SITE_OTHER): Payer: Medicare Other

## 2014-01-17 DIAGNOSIS — Z23 Encounter for immunization: Secondary | ICD-10-CM

## 2014-01-17 DIAGNOSIS — J309 Allergic rhinitis, unspecified: Secondary | ICD-10-CM | POA: Diagnosis not present

## 2014-01-24 ENCOUNTER — Ambulatory Visit (INDEPENDENT_AMBULATORY_CARE_PROVIDER_SITE_OTHER): Payer: Medicare Other

## 2014-01-24 DIAGNOSIS — J309 Allergic rhinitis, unspecified: Secondary | ICD-10-CM

## 2014-01-31 ENCOUNTER — Ambulatory Visit (INDEPENDENT_AMBULATORY_CARE_PROVIDER_SITE_OTHER): Payer: Medicare Other

## 2014-01-31 DIAGNOSIS — J309 Allergic rhinitis, unspecified: Secondary | ICD-10-CM

## 2014-02-01 DIAGNOSIS — E039 Hypothyroidism, unspecified: Secondary | ICD-10-CM | POA: Diagnosis not present

## 2014-02-02 ENCOUNTER — Encounter: Payer: Self-pay | Admitting: Internal Medicine

## 2014-02-02 DIAGNOSIS — M436 Torticollis: Secondary | ICD-10-CM | POA: Diagnosis not present

## 2014-02-02 DIAGNOSIS — D485 Neoplasm of uncertain behavior of skin: Secondary | ICD-10-CM | POA: Diagnosis not present

## 2014-02-02 DIAGNOSIS — E039 Hypothyroidism, unspecified: Secondary | ICD-10-CM | POA: Diagnosis not present

## 2014-02-02 DIAGNOSIS — L309 Dermatitis, unspecified: Secondary | ICD-10-CM | POA: Diagnosis not present

## 2014-02-07 ENCOUNTER — Ambulatory Visit (INDEPENDENT_AMBULATORY_CARE_PROVIDER_SITE_OTHER): Payer: Medicare Other

## 2014-02-07 DIAGNOSIS — J309 Allergic rhinitis, unspecified: Secondary | ICD-10-CM

## 2014-02-08 ENCOUNTER — Other Ambulatory Visit: Payer: Self-pay

## 2014-02-08 DIAGNOSIS — D485 Neoplasm of uncertain behavior of skin: Secondary | ICD-10-CM | POA: Diagnosis not present

## 2014-02-08 DIAGNOSIS — L409 Psoriasis, unspecified: Secondary | ICD-10-CM | POA: Diagnosis not present

## 2014-02-08 DIAGNOSIS — L538 Other specified erythematous conditions: Secondary | ICD-10-CM | POA: Diagnosis not present

## 2014-02-08 DIAGNOSIS — L821 Other seborrheic keratosis: Secondary | ICD-10-CM | POA: Diagnosis not present

## 2014-02-14 ENCOUNTER — Ambulatory Visit (INDEPENDENT_AMBULATORY_CARE_PROVIDER_SITE_OTHER): Payer: Medicare Other

## 2014-02-14 DIAGNOSIS — J309 Allergic rhinitis, unspecified: Secondary | ICD-10-CM | POA: Diagnosis not present

## 2014-02-21 ENCOUNTER — Ambulatory Visit (INDEPENDENT_AMBULATORY_CARE_PROVIDER_SITE_OTHER): Payer: Medicare Other

## 2014-02-21 DIAGNOSIS — J309 Allergic rhinitis, unspecified: Secondary | ICD-10-CM | POA: Diagnosis not present

## 2014-02-24 DIAGNOSIS — H2513 Age-related nuclear cataract, bilateral: Secondary | ICD-10-CM | POA: Diagnosis not present

## 2014-02-28 ENCOUNTER — Ambulatory Visit (INDEPENDENT_AMBULATORY_CARE_PROVIDER_SITE_OTHER): Payer: Medicare Other

## 2014-02-28 DIAGNOSIS — J309 Allergic rhinitis, unspecified: Secondary | ICD-10-CM

## 2014-03-03 ENCOUNTER — Encounter: Payer: Self-pay | Admitting: Internal Medicine

## 2014-03-07 ENCOUNTER — Ambulatory Visit (INDEPENDENT_AMBULATORY_CARE_PROVIDER_SITE_OTHER): Payer: Medicare Other

## 2014-03-07 DIAGNOSIS — J309 Allergic rhinitis, unspecified: Secondary | ICD-10-CM

## 2014-03-14 ENCOUNTER — Ambulatory Visit (INDEPENDENT_AMBULATORY_CARE_PROVIDER_SITE_OTHER): Payer: Medicare Other

## 2014-03-14 DIAGNOSIS — J309 Allergic rhinitis, unspecified: Secondary | ICD-10-CM

## 2014-03-16 DIAGNOSIS — E039 Hypothyroidism, unspecified: Secondary | ICD-10-CM | POA: Diagnosis not present

## 2014-03-20 ENCOUNTER — Encounter: Payer: Self-pay | Admitting: Internal Medicine

## 2014-03-20 DIAGNOSIS — L239 Allergic contact dermatitis, unspecified cause: Secondary | ICD-10-CM | POA: Diagnosis not present

## 2014-03-21 ENCOUNTER — Ambulatory Visit (INDEPENDENT_AMBULATORY_CARE_PROVIDER_SITE_OTHER): Payer: Medicare Other

## 2014-03-21 DIAGNOSIS — J309 Allergic rhinitis, unspecified: Secondary | ICD-10-CM

## 2014-03-22 DIAGNOSIS — L259 Unspecified contact dermatitis, unspecified cause: Secondary | ICD-10-CM | POA: Diagnosis not present

## 2014-03-22 DIAGNOSIS — X32XXXA Exposure to sunlight, initial encounter: Secondary | ICD-10-CM | POA: Diagnosis not present

## 2014-03-22 DIAGNOSIS — L570 Actinic keratosis: Secondary | ICD-10-CM | POA: Diagnosis not present

## 2014-03-22 DIAGNOSIS — L400 Psoriasis vulgaris: Secondary | ICD-10-CM | POA: Diagnosis not present

## 2014-03-28 ENCOUNTER — Ambulatory Visit (INDEPENDENT_AMBULATORY_CARE_PROVIDER_SITE_OTHER): Payer: Medicare Other

## 2014-03-28 DIAGNOSIS — J309 Allergic rhinitis, unspecified: Secondary | ICD-10-CM

## 2014-04-04 ENCOUNTER — Ambulatory Visit (INDEPENDENT_AMBULATORY_CARE_PROVIDER_SITE_OTHER): Payer: Medicare Other

## 2014-04-04 DIAGNOSIS — J309 Allergic rhinitis, unspecified: Secondary | ICD-10-CM

## 2014-04-07 ENCOUNTER — Ambulatory Visit (INDEPENDENT_AMBULATORY_CARE_PROVIDER_SITE_OTHER): Payer: Medicare Other

## 2014-04-07 DIAGNOSIS — J309 Allergic rhinitis, unspecified: Secondary | ICD-10-CM | POA: Diagnosis not present

## 2014-04-10 ENCOUNTER — Ambulatory Visit: Payer: Medicare Other

## 2014-04-11 ENCOUNTER — Ambulatory Visit (INDEPENDENT_AMBULATORY_CARE_PROVIDER_SITE_OTHER): Payer: Medicare Other

## 2014-04-11 DIAGNOSIS — J309 Allergic rhinitis, unspecified: Secondary | ICD-10-CM | POA: Diagnosis not present

## 2014-04-12 DIAGNOSIS — B86 Scabies: Secondary | ICD-10-CM | POA: Diagnosis not present

## 2014-04-18 ENCOUNTER — Ambulatory Visit: Payer: Medicare Other

## 2014-04-25 ENCOUNTER — Ambulatory Visit (INDEPENDENT_AMBULATORY_CARE_PROVIDER_SITE_OTHER): Payer: Medicare Other

## 2014-04-25 DIAGNOSIS — J309 Allergic rhinitis, unspecified: Secondary | ICD-10-CM | POA: Diagnosis not present

## 2014-05-02 ENCOUNTER — Ambulatory Visit (INDEPENDENT_AMBULATORY_CARE_PROVIDER_SITE_OTHER): Payer: Medicare Other

## 2014-05-02 DIAGNOSIS — J309 Allergic rhinitis, unspecified: Secondary | ICD-10-CM | POA: Diagnosis not present

## 2014-05-09 ENCOUNTER — Ambulatory Visit (INDEPENDENT_AMBULATORY_CARE_PROVIDER_SITE_OTHER): Payer: Medicare Other

## 2014-05-09 DIAGNOSIS — J309 Allergic rhinitis, unspecified: Secondary | ICD-10-CM

## 2014-05-09 DIAGNOSIS — J209 Acute bronchitis, unspecified: Secondary | ICD-10-CM | POA: Diagnosis not present

## 2014-05-10 DIAGNOSIS — B86 Scabies: Secondary | ICD-10-CM | POA: Diagnosis not present

## 2014-05-16 ENCOUNTER — Ambulatory Visit (INDEPENDENT_AMBULATORY_CARE_PROVIDER_SITE_OTHER): Payer: Medicare Other

## 2014-05-16 DIAGNOSIS — J309 Allergic rhinitis, unspecified: Secondary | ICD-10-CM | POA: Diagnosis not present

## 2014-05-22 DIAGNOSIS — R5383 Other fatigue: Secondary | ICD-10-CM | POA: Diagnosis not present

## 2014-05-23 ENCOUNTER — Ambulatory Visit (INDEPENDENT_AMBULATORY_CARE_PROVIDER_SITE_OTHER): Payer: Medicare Other

## 2014-05-23 DIAGNOSIS — J309 Allergic rhinitis, unspecified: Secondary | ICD-10-CM

## 2014-05-30 ENCOUNTER — Ambulatory Visit: Payer: Medicare Other

## 2014-06-05 DIAGNOSIS — E039 Hypothyroidism, unspecified: Secondary | ICD-10-CM | POA: Diagnosis not present

## 2014-06-06 ENCOUNTER — Ambulatory Visit (INDEPENDENT_AMBULATORY_CARE_PROVIDER_SITE_OTHER): Payer: Medicare Other

## 2014-06-06 DIAGNOSIS — J309 Allergic rhinitis, unspecified: Secondary | ICD-10-CM

## 2014-06-13 ENCOUNTER — Ambulatory Visit (INDEPENDENT_AMBULATORY_CARE_PROVIDER_SITE_OTHER): Payer: Medicare Other

## 2014-06-13 DIAGNOSIS — J309 Allergic rhinitis, unspecified: Secondary | ICD-10-CM

## 2014-06-15 ENCOUNTER — Encounter: Payer: Self-pay | Admitting: Internal Medicine

## 2014-06-15 ENCOUNTER — Ambulatory Visit (INDEPENDENT_AMBULATORY_CARE_PROVIDER_SITE_OTHER): Payer: Medicare Other | Admitting: Internal Medicine

## 2014-06-15 VITALS — BP 112/70 | HR 91 | Ht 70.5 in | Wt 237.2 lb

## 2014-06-15 DIAGNOSIS — R21 Rash and other nonspecific skin eruption: Secondary | ICD-10-CM | POA: Diagnosis not present

## 2014-06-15 DIAGNOSIS — J301 Allergic rhinitis due to pollen: Secondary | ICD-10-CM | POA: Diagnosis not present

## 2014-06-15 DIAGNOSIS — R05 Cough: Secondary | ICD-10-CM | POA: Diagnosis not present

## 2014-06-15 DIAGNOSIS — J432 Centrilobular emphysema: Secondary | ICD-10-CM | POA: Diagnosis not present

## 2014-06-15 DIAGNOSIS — R059 Cough, unspecified: Secondary | ICD-10-CM

## 2014-06-15 DIAGNOSIS — R490 Dysphonia: Secondary | ICD-10-CM

## 2014-06-15 MED ORDER — LEVALBUTEROL HCL 0.63 MG/3ML IN NEBU
0.6300 mg | INHALATION_SOLUTION | Freq: Once | RESPIRATORY_TRACT | Status: AC
Start: 2014-06-15 — End: 2014-06-15
  Administered 2014-06-15: 0.63 mg via RESPIRATORY_TRACT

## 2014-06-15 NOTE — Assessment & Plan Note (Signed)
Intertrigo under his abdominal pannus can be managed as yeast-gentle soap and water, careful drying, antifungal topical medication and possibly a drying powder like cornstarch  Patches of dry erythematous skin with very light scale look like eczema. This may respond to a topical hydrocortisone OTC.  If either fails to resolve, he will have an opportunity to show his primary physician next week and if necessary be referred to dermatology.

## 2014-06-15 NOTE — Assessment & Plan Note (Signed)
Without acute exacerbation this winter. Medications reviewed.

## 2014-06-15 NOTE — Assessment & Plan Note (Signed)
He doesn't recognize postnasal drainage or reflux and has not recognized significant day-to-day change since onset 2 months ago. No significant effect from Z-Pak 2. 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 He had a significant smoking history so neoplasm is not excluded. Plan-ENT referral

## 2014-06-15 NOTE — Progress Notes (Signed)
Subjective:    Patient ID: [name], male    DOB: 1941-03-13, 74 y.o.   MRN: [id number]  HPI 09/30/10- 40 yoM former smoker followed for asthma/ COPD, allergic rhinitis .    75 yoM former smoker followed for asthma/ COPD, allergic rhinitis ACUTE VISIT:Increased congestion in chest and wheezing, also would like to discuss when to take Allegra. Still wheezing in the mornings some, after seeing nurse practitioner August 16. No infection. He thinks he is more easily short of breath with exertion but admits he is walking less regularly and may have gained some weight. He wants to blame increased cavities and dental problems on his inhalers. We discussed dry mouth. He has started using biotene some. Uses albuterol rescue inhaler infrequently. Continues allergy vaccine 1:10 GH and denies sneezing, nasal congestion or rhinorrhea. Office spirometry- 01/02/12- suboptimal effort ACE based on shape of curve: Mild obstructive airways disease. FVC 3.66/82%, FEV1 2.43/71%, FEV1/FEC 0.66/87%, FEF 25-75% 1.33/44% predicted. CXR 12/12/11-  IMPRESSION:  No active cardiopulmonary disease.  Original Report Authenticated By: [name], M.D.   03/17/2012 f/u ov/Wert -cc loosing ground since Sept 2013 on allergy shots  Weekly symptoms went from bad to worse x one week Lots of chest congestion, coughing with production of white mucus already started  on zpak - maintained on dulera but technique not aoptimal, not using any rescue but sob minimal over baseline.  Cough worse  At hs.  Assoc with nasal congestion not responding to allegra and sense of pnds. Denies any obvious fluctuation of symptoms with weather or environmental changes or other aggravating or alleviating factors except as outlined above   03/23/12- 70 yoM former smoker followed for asthma/ COPD, allergic rhinitis He called the office today complaining of head congestion with ear pressure and we brought him in specifically to get nebulizer treatment  and Depo-Medrol for presumed eustachian dysfunction.  07/01/12- 6 yoM former smoker followed for asthma/ COPD, allergic rhinitis FOLLOWS FOR: still on allergy vaccine 1:10 GH and doing well; denies any troubles with allergies at this time. Doing so well that he occasionally forgets a Dulera dose. Never needs rescue inhaler.   Please call as needed  We can continue allergy vaccine 1:10 GH   ACUTE OV 08/18/2012 - This is an acute office visit for patient with asthma/allergies/COPD being followed by Dr. Joni [name] [name] - A week ago he started having insidious onset of laryngitis. On 08/13/2012 he called into the answering service and complained of symptoms of laryngitis and green sputum. I called in 5 days of Levaquin which she completed yesterday. This has helped his symptoms immensely. However, he feels that he is still not back to baseline. This is interfering with his son and to the church due to  coarse voice. In addition he has significant fatigue and his sputum though not Ch[place] Si is now yellow although the volume is reduced. He denies any fever, shortness of breath or wheezing that is out of proportion to her normal range. He is wondering about a repeat  antibiotic or doing a first course of steroids - Spirometry today April 23rd 2014 shows FEV1 2.26 L/65%. And a ratio .70. The FEV1 is significantly down compared to baseline  01/06/13- 71 yoM former smoker (3 ppd/99 pkyr) followed for asthma/ COPD, allergic rhinitis FOLLOWS NFA:OZHY wkly Allergy vaccine 1:10 GH, doing well.Occass. cough-white,denies wheezing and sob Has had flu vaccine. Reports breathing "good" and says walking is limited more by bursitis in his left thigh. Little cough or  wheeze.  04/22/13- 64 yoM former smoker (3 ppd/99 pkyr) followed for asthma/ COPD, allergic rhinitis ACUTE VISIT: swelling near bottom left side of neck/chin area; also having Chest congestion.-started yesterday. Has started using Symbicort and Qsymia. Started  taking Amoxicillin 500 mg TID x 7 days on 04-15-13. Losing weight on phentermine/ Qsymia Rx'd by Dr Renne Crigler. .Bothersome yellow postnasal drip. Started amoxicillin December 19. Residual chest congestion. Complains of swelling left side of mouth and palpable under  left mandible while on amoxicillin. CXR 01/06/13 IMPRESSION:  Mild hyperinflation again noted. Stable right basilar streaky  atelectasis or scarring. No acute infiltrate or pulmonary edema.  Electronically Signed  By: Natasha Mead  On: 01/06/2013 10:18  06/15/14-  72 yoM former smoker (3 ppd/99 pkyr) followed for asthma/ COPD, allergic rhinitis ACUTE VISIT:still on Allergy vaccine 1:10 GH; having congestion in throat and can not get rid of it. Raspy voice as well. Former heavy smoker, retired Optician, dispensing now complains of hoarseness 2 months. No clear days. Throat is not painful and there is no dysphagia. No effect from 2 rounds of Z-Pak from his primary physician/Dr. Renne Crigler. Chest feels clear and normal. Denies postnasal drainage and does not recognize reflux. Additional problem: Rash-mildly pruritic rash on buttocks 2 days  ROS-see HPI Constitutional:   No-   weight loss, night sweats, fevers, chills, fatigue, lassitude. HEENT:   No-  headaches, difficulty swallowing, tooth/dental problems, sore throat,       No-  sneezing, itching, ear ache, nasal congestion, post nasal drip,  CV:  No-   chest pain, orthopnea, PND, swelling in lower extremities, anasarca, dizziness, palpitations Resp: No-   shortness of breath with exertion or at rest.            + productive cough,  No non-productive cough,  No- coughing up of blood.              No-   change in color of mucus.  No- wheezing.   Skin:   +per HPI GI:  No-   heartburn, indigestion, abdominal pain, nausea, vomiting, GU:  MS:  +  joint pain or swelling.   Neuro-     nothing unusual Psych:  No- change in mood or affect. No depression or anxiety.  No memory loss.   Objective:   Physical  Exam General- Alert, Oriented, Affect-appropriate, Distress- none acute Skin- + intertrigo under pannus. + Patches of dry erythema with slight scale on left abdomen and on buttocks Lymphadenopathy- none Head- atraumatic            Eyes- Gross vision intact, PERRLA, conjunctivae and secretions clear            Ears- Hearing, canals-normal            Nose- Clear, no-Septal dev, mucus, polyps, erosion, perforation             Throat- Mallampati II , mucosa clear , drainage- none, tonsils- atrophic, +hoarse Neck- flexible , trachea midline, no stridor , thyroid nl, carotid no bruit.  Chest - symmetrical excursion , unlabored           Heart/CV- RRR , no murmur , no gallop  , no rub, nl s1 s2                           - JVD- none , edema- none, stasis changes- none, varices- none           Lung- clear to P&A,  wheeze- none, cough+hoarse cough , dullness-none, rub- none           Chest wall-  Abd-  Br/ Gen/ Rectal- Not done, not indicated Extrem- cyanosis- none, clubbing, none, atrophy- none, strength- nl Neuro- grossly intact to observation   Assessment & Plan:

## 2014-06-15 NOTE — Assessment & Plan Note (Signed)
He has continued allergy vaccine 1:10 GH and indicates he has felt  it successful

## 2014-06-15 NOTE — Patient Instructions (Signed)
Neb xop 0.63  For the rash- Try an antifungal otc ointment like Tolnaftate or one of the others used for athletes foot or jock rash. It may take several days to show effect.  Dr Shelia Media will have other suggestions   Order- referral to [facility]   Dx persistent hoarseness

## 2014-06-16 ENCOUNTER — Telehealth: Payer: Self-pay | Admitting: Internal Medicine

## 2014-06-16 NOTE — Telephone Encounter (Signed)
Spoke with pt. States that when he was here yesterday, CY gave him a breathing treatment. 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 Advised him that this would be fine but only use the medication if he absolutely needs it. He agreed and verbalized understanding.

## 2014-06-20 ENCOUNTER — Ambulatory Visit (INDEPENDENT_AMBULATORY_CARE_PROVIDER_SITE_OTHER): Payer: Medicare Other

## 2014-06-20 DIAGNOSIS — E039 Hypothyroidism, unspecified: Secondary | ICD-10-CM | POA: Diagnosis not present

## 2014-06-20 DIAGNOSIS — E6609 Other obesity due to excess calories: Secondary | ICD-10-CM | POA: Diagnosis not present

## 2014-06-20 DIAGNOSIS — J309 Allergic rhinitis, unspecified: Secondary | ICD-10-CM | POA: Diagnosis not present

## 2014-06-20 DIAGNOSIS — R490 Dysphonia: Secondary | ICD-10-CM | POA: Diagnosis not present

## 2014-06-21 DIAGNOSIS — J387 Other diseases of larynx: Secondary | ICD-10-CM | POA: Diagnosis not present

## 2014-06-21 DIAGNOSIS — R490 Dysphonia: Secondary | ICD-10-CM | POA: Diagnosis not present

## 2014-06-21 DIAGNOSIS — J449 Chronic obstructive pulmonary disease, unspecified: Secondary | ICD-10-CM | POA: Diagnosis not present

## 2014-06-27 ENCOUNTER — Ambulatory Visit (INDEPENDENT_AMBULATORY_CARE_PROVIDER_SITE_OTHER): Payer: Medicare Other

## 2014-06-27 DIAGNOSIS — J309 Allergic rhinitis, unspecified: Secondary | ICD-10-CM

## 2014-07-04 ENCOUNTER — Ambulatory Visit (INDEPENDENT_AMBULATORY_CARE_PROVIDER_SITE_OTHER): Payer: Medicare Other

## 2014-07-04 DIAGNOSIS — J309 Allergic rhinitis, unspecified: Secondary | ICD-10-CM | POA: Diagnosis not present

## 2014-07-07 ENCOUNTER — Encounter: Payer: Self-pay | Admitting: Internal Medicine

## 2014-07-10 DIAGNOSIS — T148 Other injury of unspecified body region: Secondary | ICD-10-CM | POA: Diagnosis not present

## 2014-07-11 ENCOUNTER — Ambulatory Visit (INDEPENDENT_AMBULATORY_CARE_PROVIDER_SITE_OTHER): Payer: Medicare Other

## 2014-07-11 DIAGNOSIS — J309 Allergic rhinitis, unspecified: Secondary | ICD-10-CM

## 2014-07-18 ENCOUNTER — Ambulatory Visit (INDEPENDENT_AMBULATORY_CARE_PROVIDER_SITE_OTHER): Payer: Medicare Other

## 2014-07-18 DIAGNOSIS — J309 Allergic rhinitis, unspecified: Secondary | ICD-10-CM

## 2014-07-19 DIAGNOSIS — R490 Dysphonia: Secondary | ICD-10-CM | POA: Diagnosis not present

## 2014-07-19 DIAGNOSIS — E039 Hypothyroidism, unspecified: Secondary | ICD-10-CM | POA: Diagnosis not present

## 2014-07-21 ENCOUNTER — Other Ambulatory Visit: Payer: Self-pay | Admitting: Internal Medicine

## 2014-07-21 ENCOUNTER — Ambulatory Visit
Admission: RE | Admit: 2014-07-21 | Discharge: 2014-07-21 | Disposition: A | Discharge status: Home / Self Care | Payer: Medicare Other | Source: Ambulatory Visit | Attending: Internal Medicine | Admitting: Internal Medicine

## 2014-07-21 DIAGNOSIS — R490 Dysphonia: Secondary | ICD-10-CM | POA: Diagnosis not present

## 2014-07-21 DIAGNOSIS — E039 Hypothyroidism, unspecified: Secondary | ICD-10-CM

## 2014-07-25 ENCOUNTER — Ambulatory Visit (INDEPENDENT_AMBULATORY_CARE_PROVIDER_SITE_OTHER): Payer: Medicare Other

## 2014-07-25 DIAGNOSIS — J309 Allergic rhinitis, unspecified: Secondary | ICD-10-CM | POA: Diagnosis not present

## 2014-08-01 ENCOUNTER — Ambulatory Visit (INDEPENDENT_AMBULATORY_CARE_PROVIDER_SITE_OTHER): Payer: Medicare Other

## 2014-08-01 DIAGNOSIS — J309 Allergic rhinitis, unspecified: Secondary | ICD-10-CM

## 2014-08-02 DIAGNOSIS — R490 Dysphonia: Secondary | ICD-10-CM | POA: Diagnosis not present

## 2014-08-02 DIAGNOSIS — K219 Gastro-esophageal reflux disease without esophagitis: Secondary | ICD-10-CM | POA: Diagnosis not present

## 2014-08-08 ENCOUNTER — Telehealth: Payer: Self-pay | Admitting: Internal Medicine

## 2014-08-08 ENCOUNTER — Ambulatory Visit (INDEPENDENT_AMBULATORY_CARE_PROVIDER_SITE_OTHER): Payer: Medicare Other

## 2014-08-08 DIAGNOSIS — J309 Allergic rhinitis, unspecified: Secondary | ICD-10-CM | POA: Diagnosis not present

## 2014-08-08 NOTE — Telephone Encounter (Signed)
If the albuterol inhaler took care of it, then we will just watch. He can repeat albuterol every 4-6 hours if needed. If he keeps having trouble please let us know.

## 2014-08-08 NOTE — Telephone Encounter (Signed)
Spoke with pt. States that last night he had an episode of SOB while laying down. He took an albuterol treatment and this seemed to help. Reports that this has not happened in years. Would like CY's recommendations.  No Known Allergies  CY - please advise. Thanks.

## 2014-08-08 NOTE — Telephone Encounter (Signed)
Pt is aware of CY's recommendations. Nothing further was needed. 

## 2014-08-14 ENCOUNTER — Emergency Department (HOSPITAL_COMMUNITY): Payer: Medicare Other

## 2014-08-14 ENCOUNTER — Encounter (HOSPITAL_COMMUNITY): Payer: Self-pay | Admitting: *Deleted

## 2014-08-14 ENCOUNTER — Inpatient Hospital Stay (HOSPITAL_COMMUNITY)
Admission: EM | Admit: 2014-08-14 | Discharge: 2014-08-16 | DRG: 247 | Disposition: A | Discharge status: Home / Self Care | Payer: Medicare Other | Attending: Cardiology | Admitting: Cardiology

## 2014-08-14 DIAGNOSIS — K2270 Barrett's esophagus without dysplasia: Secondary | ICD-10-CM | POA: Diagnosis present

## 2014-08-14 DIAGNOSIS — J45909 Unspecified asthma, uncomplicated: Secondary | ICD-10-CM | POA: Diagnosis present

## 2014-08-14 DIAGNOSIS — K219 Gastro-esophageal reflux disease without esophagitis: Secondary | ICD-10-CM | POA: Diagnosis present

## 2014-08-14 DIAGNOSIS — I2510 Atherosclerotic heart disease of native coronary artery without angina pectoris: Secondary | ICD-10-CM

## 2014-08-14 DIAGNOSIS — J432 Centrilobular emphysema: Secondary | ICD-10-CM | POA: Diagnosis present

## 2014-08-14 DIAGNOSIS — Z955 Presence of coronary angioplasty implant and graft: Secondary | ICD-10-CM

## 2014-08-14 DIAGNOSIS — I200 Unstable angina: Secondary | ICD-10-CM | POA: Diagnosis present

## 2014-08-14 DIAGNOSIS — R072 Precordial pain: Secondary | ICD-10-CM | POA: Diagnosis not present

## 2014-08-14 DIAGNOSIS — R079 Chest pain, unspecified: Secondary | ICD-10-CM | POA: Diagnosis not present

## 2014-08-14 DIAGNOSIS — E785 Hyperlipidemia, unspecified: Secondary | ICD-10-CM | POA: Diagnosis present

## 2014-08-14 DIAGNOSIS — I4510 Unspecified right bundle-branch block: Secondary | ICD-10-CM | POA: Diagnosis present

## 2014-08-14 DIAGNOSIS — Z87891 Personal history of nicotine dependence: Secondary | ICD-10-CM | POA: Diagnosis not present

## 2014-08-14 DIAGNOSIS — K449 Diaphragmatic hernia without obstruction or gangrene: Secondary | ICD-10-CM | POA: Diagnosis present

## 2014-08-14 DIAGNOSIS — L409 Psoriasis, unspecified: Secondary | ICD-10-CM | POA: Diagnosis present

## 2014-08-14 DIAGNOSIS — J449 Chronic obstructive pulmonary disease, unspecified: Secondary | ICD-10-CM | POA: Diagnosis present

## 2014-08-14 DIAGNOSIS — E039 Hypothyroidism, unspecified: Secondary | ICD-10-CM | POA: Diagnosis present

## 2014-08-14 DIAGNOSIS — I10 Essential (primary) hypertension: Secondary | ICD-10-CM | POA: Diagnosis present

## 2014-08-14 DIAGNOSIS — I472 Ventricular tachycardia: Secondary | ICD-10-CM | POA: Diagnosis not present

## 2014-08-14 DIAGNOSIS — N400 Enlarged prostate without lower urinary tract symptoms: Secondary | ICD-10-CM | POA: Diagnosis present

## 2014-08-14 DIAGNOSIS — I25110 Atherosclerotic heart disease of native coronary artery with unstable angina pectoris: Secondary | ICD-10-CM | POA: Diagnosis not present

## 2014-08-14 DIAGNOSIS — Z7982 Long term (current) use of aspirin: Secondary | ICD-10-CM | POA: Diagnosis not present

## 2014-08-14 DIAGNOSIS — Z9861 Coronary angioplasty status: Secondary | ICD-10-CM

## 2014-08-14 DIAGNOSIS — Z825 Family history of asthma and other chronic lower respiratory diseases: Secondary | ICD-10-CM

## 2014-08-14 DIAGNOSIS — R0789 Other chest pain: Secondary | ICD-10-CM | POA: Diagnosis not present

## 2014-08-14 HISTORY — DX: Barrett's esophagus without dysplasia: K22.70

## 2014-08-14 HISTORY — DX: Essential (primary) hypertension: I10

## 2014-08-14 LAB — TROPONIN I
Troponin I: <0.03 ng/mL (ref <0.031)
Troponin I: <0.03 ng/mL (ref <0.031)

## 2014-08-14 LAB — COMPREHENSIVE METABOLIC PANEL
ALBUMIN: 3.7 g/dL (ref 3.5–5.2)
ALK PHOS: 80 U/L (ref 39–117)
ALT: 18 U/L (ref 0–53)
AST: 22 U/L (ref 0–37)
Anion gap: 9 (ref 5–15)
BUN: 11 mg/dL (ref 6–23)
CALCIUM: 9 mg/dL (ref 8.4–10.5)
CO2: 23 mmol/L (ref 19–32)
Chloride: 106 mmol/L (ref 96–112)
Creatinine, Ser: 0.85 mg/dL (ref 0.50–1.35)
GFR calc Af Amer: >90 mL/min (ref >90)
GFR calc non Af Amer: 83 mL/min — ABNORMAL LOW (ref >90)
Glucose, Bld: 136 mg/dL — ABNORMAL HIGH (ref 70–99)
POTASSIUM: 4 mmol/L (ref 3.5–5.1)
SODIUM: 138 mmol/L (ref 135–145)
TOTAL PROTEIN: 6.1 g/dL (ref 6.0–8.3)
Total Bilirubin: 1 mg/dL (ref 0.3–1.2)

## 2014-08-14 LAB — LIPASE, BLOOD: LIPASE: 32 U/L (ref 11–59)

## 2014-08-14 LAB — I-STAT TROPONIN, ED: Troponin i, poc: 0 ng/mL (ref 0.00–0.08)

## 2014-08-14 MED ORDER — IRBESARTAN 300 MG PO TABS
300.0000 mg | ORAL_TABLET | Freq: Every day | ORAL | Status: DC
  Administered 2014-08-15 – 2014-08-16 (×2): 300 mg via ORAL
  Filled 2014-08-14 (×2): qty 1

## 2014-08-14 MED ORDER — SODIUM CHLORIDE 0.9 % IV SOLN: 250.0000 mL | INTRAVENOUS | Status: DC | PRN

## 2014-08-14 MED ORDER — ATORVASTATIN CALCIUM 40 MG PO TABS
40.0000 mg | ORAL_TABLET | Freq: Every day | ORAL | Status: DC
  Administered 2014-08-15 – 2014-08-16 (×2): 40 mg via ORAL
  Filled 2014-08-14 (×2): qty 1

## 2014-08-14 MED ORDER — VITAMIN C 500 MG PO TABS
1000.0000 mg | ORAL_TABLET | Freq: Every day | ORAL | Status: DC
  Administered 2014-08-15 – 2014-08-16 (×2): 1000 mg via ORAL
  Filled 2014-08-14 (×2): qty 2

## 2014-08-14 MED ORDER — PANTOPRAZOLE SODIUM 40 MG PO TBEC
40.0000 mg | DELAYED_RELEASE_TABLET | Freq: Every day | ORAL | Status: DC
  Administered 2014-08-15 – 2014-08-16 (×2): 40 mg via ORAL
  Filled 2014-08-14 (×2): qty 1

## 2014-08-14 MED ORDER — ACETAMINOPHEN 325 MG PO TABS
650.0000 mg | ORAL_TABLET | ORAL | Status: DC | PRN
  Administered 2014-08-14 – 2014-08-16 (×3): 650 mg via ORAL
  Filled 2014-08-14 (×3): qty 2

## 2014-08-14 MED ORDER — CYCLOSPORINE 0.05 % OP EMUL
1.0000 [drp] | Freq: Two times a day (BID) | OPHTHALMIC | Status: DC
  Administered 2014-08-14 – 2014-08-16 (×4): 1 [drp] via OPHTHALMIC
  Filled 2014-08-14 (×5): qty 1

## 2014-08-14 MED ORDER — VITAMIN D3 50 MCG (2000 UT) PO TABS
1000.0000 [IU] | ORAL_TABLET | Freq: Every day | ORAL | Status: DC
  Administered 2014-08-15 – 2014-08-16 (×2): 1000 [IU] via ORAL
  Filled 2014-08-14 (×2): qty 0.5

## 2014-08-14 MED ORDER — FINASTERIDE 5 MG PO TABS
5.0000 mg | ORAL_TABLET | Freq: Every day | ORAL | Status: DC
  Administered 2014-08-15 – 2014-08-16 (×2): 5 mg via ORAL
  Filled 2014-08-14 (×2): qty 1

## 2014-08-14 MED ORDER — NITROGLYCERIN 2 % TD OINT
0.5000 [in_us] | TOPICAL_OINTMENT | Freq: Four times a day (QID) | TRANSDERMAL | Status: DC
  Administered 2014-08-14 – 2014-08-16 (×4): 0.5 [in_us] via TOPICAL
  Filled 2014-08-14: qty 30

## 2014-08-14 MED ORDER — ASPIRIN EC 81 MG PO TBEC
81.0000 mg | DELAYED_RELEASE_TABLET | Freq: Every day | ORAL | Status: DC
  Administered 2014-08-16: 81 mg via ORAL
  Filled 2014-08-14 (×2): qty 1

## 2014-08-14 MED ORDER — LORATADINE 10 MG PO TABS
10.0000 mg | ORAL_TABLET | Freq: Every day | ORAL | Status: DC
  Administered 2014-08-15 – 2014-08-16 (×2): 10 mg via ORAL
  Filled 2014-08-14 (×2): qty 1

## 2014-08-14 MED ORDER — ALPRAZOLAM 0.25 MG PO TABS
0.2500 mg | ORAL_TABLET | Freq: Two times a day (BID) | ORAL | Status: DC | PRN
Start: 2014-08-14 — End: 2014-08-16

## 2014-08-14 MED ORDER — LEVOTHYROXINE SODIUM 150 MCG PO TABS
150.0000 ug | ORAL_TABLET | Freq: Every day | ORAL | Status: DC
  Administered 2014-08-15 – 2014-08-16 (×2): 150 ug via ORAL
  Filled 2014-08-14 (×3): qty 1

## 2014-08-14 MED ORDER — SODIUM CHLORIDE 0.9 % IV SOLN
INTRAVENOUS | Status: DC
  Administered 2014-08-14 – 2014-08-15 (×2): via INTRAVENOUS

## 2014-08-14 MED ORDER — NITROGLYCERIN 0.4 MG SL SUBL: 0.4000 mg | SUBLINGUAL_TABLET | SUBLINGUAL | Status: DC | PRN

## 2014-08-14 MED ORDER — HEPARIN (PORCINE) IN NACL 100-0.45 UNIT/ML-% IJ SOLN
1300.0000 [IU]/h | INTRAMUSCULAR | Status: DC
  Administered 2014-08-14 – 2014-08-15 (×2): 1300 [IU]/h via INTRAVENOUS
  Filled 2014-08-14 (×3): qty 250

## 2014-08-14 MED ORDER — HEPARIN BOLUS VIA INFUSION
4000.0000 [IU] | Freq: Once | INTRAVENOUS | Status: AC
  Administered 2014-08-14: 4000 [IU] via INTRAVENOUS
  Filled 2014-08-14: qty 4000

## 2014-08-14 MED ORDER — ACETAMINOPHEN 325 MG PO TABS: 650.0000 mg | ORAL_TABLET | ORAL | Status: DC | PRN

## 2014-08-14 MED ORDER — ONDANSETRON HCL 4 MG/2ML IJ SOLN: 4.0000 mg | Freq: Four times a day (QID) | INTRAMUSCULAR | Status: DC | PRN

## 2014-08-14 MED ORDER — ZOLPIDEM TARTRATE 5 MG PO TABS: 5.0000 mg | ORAL_TABLET | Freq: Every evening | ORAL | Status: DC | PRN

## 2014-08-14 NOTE — ED Notes (Signed)
Pt states that yesterday he had chest pain and took 2 sprays of nitro yesterday. pts that the pain was relieved however he now has a "raw feeling in my esophagus." pt states that his PCP told him to come in today to be evaluated.

## 2014-08-14 NOTE — Progress Notes (Signed)
ANTICOAGULATION CONSULT NOTE - Initial Consult  Pharmacy Consult for Heparin Indication: chest pain/ACS  No Known Allergies  Patient Measurements: Height: 5' 10.5" (179.1 cm) Weight: 232 lb (105.235 kg) IBW/kg (Calculated) : 74.15 Heparin Dosing Weight: 96.5 kg  Vital Signs: Temp: 97.9 F (36.6 C) (04/18 1003) Temp Source: Oral (04/18 1003) BP: 134/82 mmHg (04/18 1502) Pulse Rate: 85 (04/18 1502)  Labs:  Recent Labs  08/14/14 1015  CREATININE 0.85    CrCl cannot be calculated (Unknown ideal weight.).   Medical History: Past Medical History  Diagnosis Date  . Hyperlipidemia   . Hiatal hernia   . GERD (gastroesophageal reflux disease)   . Hypothyroidism   . Allergic asthma   . COPD (chronic obstructive pulmonary disease)   . CAD (coronary artery disease)   . BPH (benign prostatic hyperplasia)   . Psoriasis   . Barrett's esophagus     Medications:   (Not in a hospital admission) Scheduled:  . [START ON 08/15/2014] aspirin EC  81 mg Oral Daily  . nitroGLYCERIN  0.5 inch Topical 4 times per day   Infusions:    Assessment: 74yo male with history of CAD presents with esophageal burning. Pharmacy is consulted to dose heparin for ACS/chest pain. CBC is pending, sCr 0.85, trop neg x1.  Goal of Therapy:  Heparin level 0.3-0.7 units/ml Monitor platelets by anticoagulation protocol: Yes   Plan:  Give 4000 units bolus x 1 Start heparin infusion at 1300 units/hr Check anti-Xa level in 8 hours and daily while on heparin Continue to monitor H&H and platelets  F/u after cath 4/19  [name]. [name], PharmD Clinical Pharmacist Pager [phone] 08/14/2014,3:10 PM

## 2014-08-14 NOTE — ED Provider Notes (Signed)
CSN: [id number]     Arrival date & time 08/14/14  4098 History   First MD Initiated Contact with Patient 08/14/14 1005     Chief Complaint  Patient presents with  . Chest Pain     (Consider location/radiation/quality/duration/timing/severity/associated sxs/prior Treatment) HPI Comments: Sx were yesterday and none today--also notes gerd sx  Patient is a 74 y.o. male presenting with chest pain. The history is provided by the spouse and the patient.  Chest Pain Pain location:  Substernal area Pain quality: pressure   Pain radiates to:  Does not radiate Pain radiates to the back: no   Pain severity:  Moderate Onset quality:  Sudden Duration:  1 day Timing:  Intermittent Progression:  Resolved Chronicity:  Recurrent Context: at rest   Relieved by:  Nitroglycerin Worsened by:  Nothing tried Associated symptoms: heartburn   Associated symptoms: no cough, no diaphoresis, no fever and no palpitations   Risk factors: coronary artery disease     Past Medical History  Diagnosis Date  . Hyperlipidemia   . Hiatal hernia   . GERD (gastroesophageal reflux disease)   . Hypothyroidism   . Allergic asthma   . COPD (chronic obstructive pulmonary disease)   . CAD (coronary artery disease)   . BPH (benign prostatic hyperplasia)   . Psoriasis   . Barrett's esophagus    Past Surgical History  Procedure Laterality Date  . Left knee arthroscopy    . Tonsillectomy    . Nasal sinus surgery     Family History  Problem Relation Age of Onset  . Lung cancer Father   . Dementia Mother   . COPD Mother    History  Substance Use Topics  . Smoking status: Former Smoker -- 3.00 packs/day for 33 years    Types: Pipe, Cigars    Quit date: 04/28/1986  . Smokeless tobacco: Not on file  . Alcohol Use: Yes    Review of Systems  Constitutional: Negative for fever and diaphoresis.  Respiratory: Negative for cough.   Cardiovascular: Positive for chest pain. Negative for palpitations.   Gastrointestinal: Positive for heartburn.  All other systems reviewed and are negative.     Allergies  Review of patient's allergies indicates no known allergies.  Home Medications   Prior to Admission medications   Medication Sig Start Date End Date Taking? Authorizing Provider  Ascorbic Acid (VITAMIN C) 1000 MG tablet Take 1,000 mg by mouth daily.    Historical Provider, MD  aspirin EC 81 MG tablet Take 81 mg by mouth daily.     Historical Provider, MD  atorvastatin (LIPITOR) 40 MG tablet Take 40 mg by mouth daily.      Historical Provider, MD  Cholecalciferol (VITAMIN D3) 2000 UNITS TABS Once a day     Historical Provider, MD  cycloSPORINE (RESTASIS) 0.05 % ophthalmic emulsion Place 1 drop into both eyes 2 (two) times daily.      Historical Provider, MD  fexofenadine (ALLEGRA) 180 MG tablet Take 180 mg by mouth daily.      Historical Provider, MD  finasteride (PROSCAR) 5 MG tablet Take 5 mg by mouth daily.      Historical Provider, MD  Glucosamine-Chondroit-Vit C-Mn (GLUCOSAMINE 1500 COMPLEX PO) Take 2 capsules by mouth daily.    Historical Provider, MD  irbesartan (AVAPRO) 300 MG tablet Take 300 mg by mouth daily.    Historical Provider, MD  levothyroxine (SYNTHROID, LEVOTHROID) 150 MCG tablet Take 150 mcg by mouth daily.      Historical  Provider, MD  Multiple Vitamins-Minerals (SENIOR MULTIVITAMIN PLUS PO) Once a day     Historical Provider, MD  nitroGLYCERIN (NITROLINGUAL) 0.4 MG/SPRAY spray Place 1 spray under the tongue every 5 (five) minutes x 3 doses as needed for chest pain. Use as directed as needed 05/24/13   Lelon Perla, MD  pantoprazole (PROTONIX) 40 MG tablet Take 30-60 min before first meal of the day 03/17/12   Tanda Rockers, MD  permethrin (ELIMITE) 5 % cream  05/19/14   Historical Provider, MD   BP 141/79 mmHg  Pulse 96  Temp(Src) 97.9 F (36.6 C) (Oral)  Resp 18  SpO2 97% Physical Exam  Constitutional: He is oriented to person, place, and time. He appears  well-developed and well-nourished.  Non-toxic appearance. No distress.  HENT:  Head: Normocephalic and atraumatic.  Eyes: Conjunctivae, EOM and lids are normal. Pupils are equal, round, and reactive to light.  Neck: Normal range of motion. Neck supple. No tracheal deviation present. No thyroid mass present.  Cardiovascular: Normal rate, regular rhythm and normal heart sounds.  Exam reveals no gallop.   No murmur heard. Pulmonary/Chest: Effort normal and breath sounds normal. No stridor. No respiratory distress. He has no decreased breath sounds. He has no wheezes. He has no rhonchi. He has no rales.  Abdominal: Soft. Normal appearance and bowel sounds are normal. He exhibits no distension. There is no tenderness. There is no rebound and no CVA tenderness.  Musculoskeletal: Normal range of motion. He exhibits no edema or tenderness.  Neurological: He is alert and oriented to person, place, and time. He has normal strength. No cranial nerve deficit or sensory deficit. GCS eye subscore is 4. GCS verbal subscore is 5. GCS motor subscore is 6.  Skin: Skin is warm and dry. No abrasion and no rash noted.  Psychiatric: He has a normal mood and affect. His speech is normal and behavior is normal.  Nursing note and vitals reviewed.   ED Course  Procedures (including critical care time) Labs Review Labs Reviewed  COMPREHENSIVE METABOLIC PANEL  LIPASE, BLOOD  I-STAT [place], ED    Imaging Review No results found.   EKG Interpretation   Date/Time:  Monday August 14 2014 09:59:19 EDT Ventricular Rate:  95 PR Interval:  198 QRS Duration: 150 QT Interval:  388 QTC Calculation: 488 R Axis:   55 Text Interpretation:  Sinus rhythm Right bundle branch block No  significant change since last tracing Confirmed by Sergei Delo  MD, Eliane Hammersmith  ([id number]) on 08/14/2014 10:06:49 AM      MDM   Final diagnoses:  Chest pain    Pt to be admitted for evaluation of his chest pain per cards    Lacretia Leigh, MD 08/16/14 1012

## 2014-08-14 NOTE — H&P (Signed)
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 He had a remote LAD PCI in 1987 and a re do PCI in 1997 (Dr Melvern Banker). His last Myoview in December of 2012 showed an ejection fraction of 49%. There was scar with mild peri-infarct ischemia in the distal anteroseptal and apical wall. He was asymptomatic and has been doing well on medical Rx. Yesterday at church he noted SSCP "the begining of chest pressure" similar to his pre PCI symptoms. He took a spray of NTG with some relief, then another spray an hour later with almost complete relief. Today he noted some "esophageal burning". He called his PCP (DR Shelia Media) and was instructed to come to the ED for further evaluation. He has a chronic RBBB. His first Troponin is negative. He still has mild esophageal discomfort.   Problem List: Past Medical History  Diagnosis Date  . Hyperlipidemia   . Hiatal hernia   . GERD (gastroesophageal reflux disease)   . Hypothyroidism   . Allergic asthma   . COPD (chronic obstructive pulmonary disease)   . CAD (coronary artery disease)   . BPH (benign prostatic hyperplasia)   . Psoriasis   . Barrett's esophagus     Past Surgical History  Procedure Laterality Date  . Left knee arthroscopy    . Tonsillectomy    . Nasal sinus surgery       Allergies: No Known Allergies   Home Medications No current facility-administered medications for this encounter.   Current Outpatient Prescriptions  Medication Sig Dispense Refill  . Ascorbic Acid (VITAMIN C) 1000 MG tablet Take 1,000 mg by mouth daily.    Marland Kitchen aspirin EC 81 MG tablet Take 81 mg by mouth daily.     Marland Kitchen atorvastatin (LIPITOR) 40 MG tablet Take 40 mg by mouth daily.      . Cholecalciferol (VITAMIN D3) 2000 UNITS TABS Take 1,000 Units by mouth daily. Once  a day    . cycloSPORINE (RESTASIS) 0.05 % ophthalmic emulsion Place 1 drop into both eyes 2 (two) times daily.      . fexofenadine (ALLEGRA) 180 MG tablet Take 180 mg by mouth daily.      . finasteride (PROSCAR) 5 MG tablet Take 5 mg by mouth daily.      . fluticasone (CUTIVATE) 0.05 % cream Apply 1 application topically every morning.  2  . Glucosamine-Chondroit-Vit C-Mn (GLUCOSAMINE 1500 COMPLEX PO) Take 2 capsules by mouth daily.    . irbesartan (AVAPRO) 300 MG tablet Take 300 mg by mouth daily.    Marland Kitchen levothyroxine (SYNTHROID, LEVOTHROID) 150 MCG tablet Take 150 mcg by mouth daily.      . Multiple Vitamins-Minerals (SENIOR MULTIVITAMIN PLUS PO) Once a day     . nitroGLYCERIN (NITROLINGUAL) 0.4 MG/SPRAY spray Place 1 spray under the tongue every 5 (five) minutes x 3 doses as needed for chest pain. Use as directed as needed 12 g 12  . pantoprazole (PROTONIX) 40 MG tablet Take 30-60 min before first meal of the day       Family History  Problem Relation Age of Onset  . Lung cancer Father   . Dementia Mother   . COPD Mother      History   Social History  . Marital Status: Married    Spouse Name: N/A  .  Number of Children: N/A  . Years of Education: N/A   Occupational History  . Minister    Social History Main Topics  . Smoking status: Former Smoker -- 3.00 packs/day for 33 years    Types: Pipe, Cigars    Quit date: 04/28/1986  . Smokeless tobacco: Not on file  . Alcohol Use: Yes  . Drug Use: No  . Sexual Activity: Not on file   Other Topics Concern  . Not on file   Social History Narrative     Review of Systems: General: negative for chills, fever, night sweats or weight changes.  Cardiovascular: negative for dyspnea on exertion, edema, orthopnea, palpitations, paroxysmal nocturnal dyspnea or shortness of breath Dermatological: negative for rash Respiratory: negative for cough or wheezing Urologic: negative for hematuria Abdominal: negative for nausea, vomiting,  diarrhea, bright red blood per rectum, melena, or hematemesis Neurologic: negative for visual changes, syncope, or dizziness He has been seen by Dr Watt Climes for Barrett's esophagus. He has recently been evaluated by an ENT for hoarseness. His PPI was doubled.  Abdominal US in Nov 2014 for abdominal bruit was normal All other systems reviewed and are otherwise negative except as noted above.  Physical Exam: Blood pressure 134/82, pulse 85, temperature 97.9 F (36.6 C), temperature source Oral, resp. rate 24, SpO2 97 %.  General appearance: alert, cooperative, no distress and mildly obese Neck: no carotid bruit, no JVD and thyroid not enlarged, symmetric, no tenderness/mass/nodules Lungs: clear to auscultation bilaterally Heart: regular rate and rhythm Abdomen: soft, non-tender; bowel sounds normal; no masses,  no organomegaly Extremities: extremities normal, atraumatic, no cyanosis or edema Pulses: 2+ and symmetric Skin: Skin color, texture, turgor normal. No rashes or lesions Neurologic: Grossly normal    Labs:   Results for orders placed or performed during the hospital encounter of 08/14/14 (from the past 24 hour(s))  Comprehensive metabolic panel     Status: Abnormal   Collection Time: 08/14/14 10:15 AM  Result Value Ref Range   Sodium 138 135 - 145 mmol/L   Potassium 4.0 3.5 - 5.1 mmol/L   Chloride 106 96 - 112 mmol/L   CO2 23 19 - 32 mmol/L   Glucose, Bld 136 (H) 70 - 99 mg/dL   BUN 11 6 - 23 mg/dL   Creatinine, Ser 0.85 0.50 - 1.35 mg/dL   Calcium 9.0 8.4 - 10.5 mg/dL   Total Protein 6.1 6.0 - 8.3 g/dL   Albumin 3.7 3.5 - 5.2 g/dL   AST 22 0 - 37 U/L   ALT 18 0 - 53 U/L   Alkaline Phosphatase 80 39 - 117 U/L   Total Bilirubin 1.0 0.3 - 1.2 mg/dL   GFR calc non Af Amer 83 (L) >90 mL/min   GFR calc Af Amer >90 >90 mL/min   Anion gap 9 5 - 15  Lipase, blood     Status: None   Collection Time: 08/14/14 10:15 AM  Result Value Ref Range   Lipase 32 11 - 59 U/L  I-stat  troponin, ED (not at [facility])     Status: None   Collection Time: 08/14/14 10:26 AM  Result Value Ref Range   Troponin i, poc 0.00 0.00 - 0.08 ng/mL   Comment 3             Radiology/Studies: Dg Chest 2 View  08/14/2014   CLINICAL DATA:  Chest pain  EXAM: CHEST  2 VIEW  COMPARISON:  01/06/2013  FINDINGS: The heart size and mediastinal contours  are within normal limits. Both lungs are clear. The visualized skeletal structures are unremarkable.  IMPRESSION: No active cardiopulmonary disease.   Electronically Signed   By: Inez Catalina M.D.   On: 08/14/2014 11:13   US Soft Tissue Head/neck  07/21/2014   CLINICAL DATA:  Hoarseness.  Hypothyroidism.  EXAM: THYROID ULTRASOUND  TECHNIQUE: Ultrasound examination of the thyroid gland and adjacent soft tissues was performed.  COMPARISON:  Thyroid ultrasound - 06/19/2011  FINDINGS: There is unchanged diffuse heterogeneity of thyroid parenchymal echotexture. No new or enlarging thyroid nodules  Right thyroid lobe  Measurements: Diminutive in size measuring 3.3 x 1.2 x 1.5 cm, unchanged, previously, 3.6 x 1.3 x 1.2 cm.  There is marked diffuse heterogeneity of the right lobe of the thyroid without discrete nodule or mass.  Left thyroid lobe  Measurements: Diminutive in size measuring 3.2 x 1.1 x 1.4 cm, unchanged, previously, 3.4 x 1.0 x 1.0 cm.  There is marked diffuse heterogeneity the left lobe of the thyroid without discrete nodule or mass.  Isthmus  Thickness: Normal in size measuring 0.3 cm in diameter, unchanged.  There is diffuse heterogeneity of the thyroid isthmus without discrete nodule or mass.  Lymphadenopathy  None visualized.  IMPRESSION: Similar findings of a nonspecific heterogeneous and diminutive appearing thyroid gland without discrete nodule or mass.   Electronically Signed   By: Sandi Mariscal M.D.   On: 07/21/2014 13:38    EKG:NSR RBBB  ASSESSMENT AND PLAN:  Principal Problem:   Chest pain with high risk of acute coronary syndrome Active  Problems:   CAD S/P PCI LAD 1987, `1997   Dyslipidemia   RBBB   Essential hypertension   Hypothyroidism   Centrilobular emphysema   BPH (benign prostatic hyperplasia)   Psoriasis   PLAN: Will review with MD. He will need further ischemic evaluation, his chest pain yesterday sounds like angina. It was the first he has used NTG in years, and his symptoms came on at rest. - ? Cath vs Myoview. He already has had a known abnormal Myoview, may be best to proceed with diagnostic cath.   Henri Medal, PA-C 08/14/2014, 3:06 PM   Patient seen, examined. Available data reviewed. Agree with findings, assessment, and plan as outlined by Kerin Ransom, PA-C. The patient is independently interviewed and examined. He is a pleasant, alert and oriented male in no distress. There are no carotid bruits. His lung fields are clear. The heart is regular rate and rhythm without murmur or gallop. There is trace peripheral edema.  EKG shows no acute changes. However, the patient's history is highly typical of unstable angina. He has had nitroglycerin responsive chest pain 2 in the last 24 hours. His chest pain feels similar to what he experienced with his previous ischemic cardiac pain. We have recommended hospital admission with IV heparin and cardiac catheterization/possible PTCA stenting tomorrow. If he develops worsening chest pain at rest, he will have to undergo cardiac catheterization urgently. All questions were answered. Further plans pending his cardiac catheterization results.  I have reviewed the risks, indications, and alternatives to cardiac catheterization and possible PTCA/stenting with the patient. Risks include but are not limited to bleeding, infection, vascular injury, stroke, myocardial infection, arrhythmia, kidney injury, radiation-related injury in the case of prolonged fluoroscopy use, emergency cardiac surgery, and death. The patient understands the risks of serious complication is low  (<4%).   Sherren Mocha, M.D. 08/14/2014 4:40 PM

## 2014-08-14 NOTE — Progress Notes (Signed)
Patient has complaints of left sided neck pain, [name], [name].  Rowe Pavy, RN

## 2014-08-14 NOTE — Progress Notes (Signed)
Consent is signed and in chart for cardiac catheterization tomorrow; wife at bedside; call bell within reach; will continue to monitor.  Rowe Pavy, RN

## 2014-08-15 ENCOUNTER — Encounter (HOSPITAL_COMMUNITY): Payer: Self-pay | Admitting: Cardiology

## 2014-08-15 ENCOUNTER — Ambulatory Visit: Payer: Medicare Other

## 2014-08-15 ENCOUNTER — Encounter (HOSPITAL_COMMUNITY)
Admission: EM | Disposition: A | Discharge status: Home / Self Care | Payer: Medicare Other | Source: Home / Self Care | Attending: Cardiology

## 2014-08-15 DIAGNOSIS — I25110 Atherosclerotic heart disease of native coronary artery with unstable angina pectoris: Principal | ICD-10-CM

## 2014-08-15 HISTORY — PX: CORONARY ANGIOPLASTY WITH STENT PLACEMENT: SHX49

## 2014-08-15 HISTORY — PX: LEFT HEART CATHETERIZATION WITH CORONARY ANGIOGRAM: SHX5451

## 2014-08-15 LAB — BASIC METABOLIC PANEL
Anion gap: 7 (ref 5–15)
BUN: 10 mg/dL (ref 6–23)
CO2: 28 mmol/L (ref 19–32)
Calcium: 8.8 mg/dL (ref 8.4–10.5)
Chloride: 108 mmol/L (ref 96–112)
Creatinine, Ser: 0.86 mg/dL (ref 0.50–1.35)
GFR calc Af Amer: >90 mL/min (ref >90)
GFR calc non Af Amer: 83 mL/min — ABNORMAL LOW (ref >90)
Glucose, Bld: 114 mg/dL — ABNORMAL HIGH (ref 70–99)
Potassium: 4.3 mmol/L (ref 3.5–5.1)
Sodium: 143 mmol/L (ref 135–145)

## 2014-08-15 LAB — CBC
HCT: 43.2 % (ref 39.0–52.0)
HEMOGLOBIN: 14.7 g/dL (ref 13.0–17.0)
MCH: 33.3 pg (ref 26.0–34.0)
MCHC: 34 g/dL (ref 30.0–36.0)
MCV: 98 fL (ref 78.0–100.0)
PLATELETS: 132 10*3/uL — AB (ref 150–400)
RBC: 4.41 MIL/uL (ref 4.22–5.81)
RDW: 13.1 % (ref 11.5–15.5)
WBC: 4.8 10*3/uL (ref 4.0–10.5)

## 2014-08-15 LAB — TROPONIN I: Troponin I: <0.03 ng/mL (ref <0.031)

## 2014-08-15 LAB — PROTIME-INR
INR: 1.05 (ref 0.00–1.49)
Prothrombin Time: 13.8 seconds (ref 11.6–15.2)

## 2014-08-15 LAB — LIPID PANEL
Cholesterol: 116 mg/dL (ref 0–200)
HDL: 37 mg/dL — ABNORMAL LOW (ref >39)
LDL Cholesterol: 58 mg/dL (ref 0–99)
Total CHOL/HDL Ratio: 3.1 RATIO
Triglycerides: 103 mg/dL (ref <150)
VLDL: 21 mg/dL (ref 0–40)

## 2014-08-15 LAB — POCT ACTIVATED CLOTTING TIME: Activated Clotting Time: 540 seconds

## 2014-08-15 LAB — HEPARIN LEVEL (UNFRACTIONATED): Heparin Unfractionated: 0.3 IU/mL (ref 0.30–0.70)

## 2014-08-15 SURGERY — LEFT HEART CATHETERIZATION WITH CORONARY ANGIOGRAM
Anesthesia: LOCAL

## 2014-08-15 MED ORDER — ASPIRIN 81 MG PO CHEW
CHEWABLE_TABLET | ORAL | Status: AC
  Administered 2014-08-15: 81 mg
  Filled 2014-08-15: qty 1

## 2014-08-15 MED ORDER — ANGIOPLASTY BOOK
Freq: Once | Status: AC
Start: 2014-08-15 — End: 2014-08-15
  Administered 2014-08-15: 23:00:00
  Filled 2014-08-15: qty 1

## 2014-08-15 MED ORDER — MIDAZOLAM HCL 2 MG/2ML IJ SOLN
INTRAMUSCULAR | Status: AC
  Filled 2014-08-15: qty 2

## 2014-08-15 MED ORDER — SODIUM CHLORIDE 0.9 % IJ SOLN: 3.0000 mL | Freq: Two times a day (BID) | INTRAMUSCULAR | Status: DC

## 2014-08-15 MED ORDER — SODIUM CHLORIDE 0.9 % IV SOLN: 250.0000 mL | INTRAVENOUS | Status: DC | PRN

## 2014-08-15 MED ORDER — FENTANYL CITRATE (PF) 100 MCG/2ML IJ SOLN
INTRAMUSCULAR | Status: AC
  Filled 2014-08-15: qty 2

## 2014-08-15 MED ORDER — HEPARIN (PORCINE) IN NACL 2-0.9 UNIT/ML-% IJ SOLN
INTRAMUSCULAR | Status: AC
  Filled 2014-08-15: qty 1500

## 2014-08-15 MED ORDER — TICAGRELOR 90 MG PO TABS
90.0000 mg | ORAL_TABLET | Freq: Two times a day (BID) | ORAL | Status: DC
  Administered 2014-08-15 – 2014-08-16 (×2): 90 mg via ORAL
  Filled 2014-08-15 (×3): qty 1

## 2014-08-15 MED ORDER — SODIUM CHLORIDE 0.9 % IV SOLN: INTRAVENOUS | Status: AC

## 2014-08-15 MED ORDER — SODIUM CHLORIDE 0.9 % IJ SOLN: 3.0000 mL | INTRAMUSCULAR | Status: DC | PRN

## 2014-08-15 MED ORDER — MORPHINE SULFATE 2 MG/ML IJ SOLN: 2.0000 mg | INTRAMUSCULAR | Status: DC | PRN

## 2014-08-15 MED ORDER — TICAGRELOR 90 MG PO TABS
ORAL_TABLET | ORAL | Status: AC
  Filled 2014-08-15: qty 1

## 2014-08-15 MED ORDER — LIDOCAINE HCL (PF) 1 % IJ SOLN
INTRAMUSCULAR | Status: AC
  Filled 2014-08-15: qty 30

## 2014-08-15 MED ORDER — NITROGLYCERIN 1 MG/10 ML FOR IR/CATH LAB
INTRA_ARTERIAL | Status: AC
  Filled 2014-08-15: qty 10

## 2014-08-15 MED ORDER — HEPARIN SODIUM (PORCINE) 1000 UNIT/ML IJ SOLN
INTRAMUSCULAR | Status: AC
  Filled 2014-08-15: qty 1

## 2014-08-15 MED ORDER — TICAGRELOR 90 MG PO TABS
ORAL_TABLET | ORAL | Status: AC
  Administered 2014-08-15: 22:00:00 90 mg via ORAL
  Filled 2014-08-15: qty 1

## 2014-08-15 MED ORDER — BIVALIRUDIN 250 MG IV SOLR
INTRAVENOUS | Status: AC
  Filled 2014-08-15: qty 250

## 2014-08-15 MED ORDER — ONDANSETRON HCL 4 MG/2ML IJ SOLN: 4.0000 mg | Freq: Four times a day (QID) | INTRAMUSCULAR | Status: DC | PRN

## 2014-08-15 MED ORDER — VERAPAMIL HCL 2.5 MG/ML IV SOLN
INTRAVENOUS | Status: AC
  Filled 2014-08-15: qty 2

## 2014-08-15 NOTE — CV Procedure (Signed)
CARDIAC CATHETERIZATION AND PERCUTANEOUS CORONARY INTERVENTION REPORT  NAME:  JONTAVIOUS COMMONS   MRN: [id number] DOB:  1940/05/04   ADMIT DATE: 08/14/2014 Procedure Date: 08/15/2014  INTERVENTIONAL CARDIOLOGIST: Leonie Man, M.D., MS PRIMARY CARE PROVIDER: Horatio Pel, MD PRIMARY CARDIOLOGIST:  Kirk Ruths, MD  PATIENT:  [name] is a 74 y.o. male with a history of CAD. He had a remote LAD PCI in 1987 and a re do PCI in 1997 (Dr Melvern Banker). His last Myoview in December of 2012 showed an ejection fraction of 49%. There was scar with mild peri-infarct ischemia in the distal anteroseptal and apical wall.  He presented PM 4/18 with SSx consistent with Unstable Angina & is referred for cardiac catheterization.  PRE-OPERATIVE DIAGNOSIS:    Unstable Angina  Prior CAD-PCI to LAD  PROCEDURES PERFORMED:    Left Heart Catheterization with Native Coronary Angiography  via Right Radial Artery   Left Ventriculography  PROCEDURE: The patient was brought to the 2nd [place] [facility] Catheter[facility] Lab in the fasting state and prepped and draped in the usual sterile fashion for Right Radial artery access. A modified Allen's test was performed on the Right wrist demonstrating excellent collateral flow for radial access.   Sterile technique was used including antiseptics, cap, gloves, gown, hand hygiene, mask and sheet. Skin prep: Chlorhexidine.   Consent: Risks of procedure as well as the alternatives and risks of each were explained to the (patient/caregiver). Consent for procedure obtained.   Time Out: Verified patient identification, verified procedure, site/side was marked, verified correct patient position, special equipment/implants available, medications/allergies/relevent history reviewed, required imaging and test results available. Performed.  Access:   Right Radial Artery: 6 Fr Sheath -  Seldinger Technique (Angiocath Micropuncture Kit)  Radial Cocktail - 10 mL;  IV Heparin 5500 Units   Left Heart Catheterization: 5Fr Catheters advanced or exchanged over a long-exchange safety J-wire under direct fluoroscopic guidance into the ascending aorta; TIG 4.0 catheter advanced first.  Left & RightCoronary Artery Cineangiography: TIG 4.0 Catheter    LV Hemodynamics (LV Gram): Angled Pigtail Catheter   Sheath removed in the Cardiac Catheterization lab with TR Band placemnt for hemostasis.  TR Band: 1440 Hours; 11 mL air  FINDINGS:  Hemodynamics:   Central Aortic Pressure / Mean: 101/60/80 mmHg  Left Ventricular Pressure / LVEDP: 102/5/12 mmHg  Left Ventriculography:  EF: ~55 %  Wall Motion: mostly normal with mild inferoapical hypokinesis.  Coronary Anatomy:  Dominance: Co-dominant  Left Main: Large caliber vessel, bifurcates into LAD and Circumflex.  Angiographically normal.  LAD: Large caliber vessel with ~80% stenosis in the transition from proximal to mid LAD (involving the takeoff of 2 small diagonal branches and septal perforators.  Following the initial significant lesion, there is diffuse ~40-50% stenosis in the more proximal stent (In-stent stenosis). The marked BMS in the mid LAD is widely patent.  Beyond the previous stented segment, the vessel gives off a major diagonal branch.  Distally, the vessel barely reaches the apex.  D1 & 2: small caliber proximal branches, ostial involvement with LAD lesion. Too small for PCI  D3: moderate caliber vessel, luminal irregularities..  Left Circumflex: Large caliber, co-dominant vessel that bifurcates into a large OM1 and the distal AV Groove that bifurcates into 2 LPL branches.  OM1: Large caliber vessel that bifurcates into OM1 and OM2.  Prior to the bifurcation there is a short eccentric ~20%. OM1 & 2 are moderate caliber tortuous vessels with no significant disease.  LPL System:  2 small to moderate caliber LPL branches with no significant disease   RCA: Large caliber, co-dominant vessel  with proximal ~20% stenosis.  The vessel terminates as a large Posterior Descending Artery with minimal luminal irregularities.  After reviewing the initial angiography, the culprit lesion was thought to be the ~80 % proximal LAD lesion along with the Instent-restenosis distally.  Preparation were made to proceed with PCI on this lesion.  Percutaneous Coronary Intervention:  80% prox LAD (TIMI 3 flow) reduced to 0% (TIMI 3 flow) Guide: 6 Fr   XB LAD 3.5 Guidewire: BMW Predilation Balloon: Euphora 2.5 mm x 15 mm;   10 Atm x 30 Sec Stent: Xience Alpine DES 3.0 mm x 28 mm; from SP1 to the proximal edge of the marked stent.  16 Atm x 30 Sec, 18 Atm x 45 Sec - final distal diameter ~3.5 mm Post-dilation Balloon: Garrison Emerge 4.0 mm x 20 mm;   6 Atm x 30 Sec - distal stent edge into marked stent -- diamter ~3.8 mm   16 Atm x 45 Atm - proximal stent - Final Diameter: 4.1 mm  Small diagonal branches (that had already been jailed - now with more pronounced ~60-70% ostial stenoses & TIMI 3 flow  Post deployment angiography in multiple views, with and without guidewire in place revealed adequate stent deployment and lesion coverage.  There was no evidence of dissection or perforation.  MEDICATIONS:  Anesthesia:  Local Lidocaine 2 ml  Sedation:  2 mg IV Versed, 50 mcg IV fentanyl ;   Omnipaque Contrast: 180 ml  Anticoagulation:  IV Heparin 5500 Units  Radial Cocktail: 5 mg Verapamil, 400 mcg NTG, 2 ml 2% Lidocaine in 10 ml NS  Angiomax Bolus & drip (ACT ~540 Sec)  Anti-Platelet Agent:  Brilinta 180 mg  PATIENT DISPOSITION:    The patient was transferred to the PACU holding area in a hemodynamicaly stable, chest pain free condition.  The patient tolerated the procedure well, and there were no complications.  EBL:   < 10 ml  The patient was stable before, during, and after the procedure.  POST-OPERATIVE DIAGNOSIS:    Severe single vessel CAD with ~proximal LAD ~80% stenosis just  proximal to 2 BMS stents.    Successful PCI of proximal LAD - Xience Alpine DES 3.0 mm x 28 mm -- post-dilated in tapered fashion to 4.1 mm prox & 3.8 mm distal (@ stent overlap)  Relatively preserved LVEF with normal LVEDP.  PLAN OF CARE:  Standard post-radial PCI care with TR band removal.    Angiomax to stop after current bag.  May have post PCI angina from jailed Diagonal branches - treat with nitrates.  DAPT x minimum of 1 yr, can switch to Plavix if needed.  Risk Factor Treatment.  Anticipate d/c tomorrow.   F/u with Dr. Stanford Breed.  Leonie Man, M.D., M.S. Interventional Cardiologist   Pager # [phone]

## 2014-08-15 NOTE — Progress Notes (Signed)
Utilization review completed.  

## 2014-08-15 NOTE — Progress Notes (Signed)
[place] for Heparin Indication: chest pain/ACS  No Known Allergies  Patient Measurements: Height: 5' 10.5" (179.1 cm) Weight: 237 lb 7 oz (107.7 kg) IBW/kg (Calculated) : 74.15 Heparin Dosing Weight: 96.5 kg  Vital Signs: Temp: 97.5 F (36.4 C) (04/18 2001) Temp Source: Oral (04/18 2001) BP: 128/73 mmHg (04/18 2001) Pulse Rate: 88 (04/18 2001)  Labs:  Recent Labs  08/14/14 1015 08/14/14 1536 08/14/14 2040 08/15/14 0019  HEPARINUNFRC  --   --   --  0.30  CREATININE 0.85  --   --   --   TROPONINI  --  <0.03 <0.03  --     Estimated Creatinine Clearance: 93 mL/min (by C-G formula based on Cr of 0.85).  Assessment: 74 y.o. male with chest pain for heparin   Goal of Therapy:  Heparin level 0.3-0.7 units/ml Monitor platelets by anticoagulation protocol: Yes   Plan:  Continue Heparin at current rate Follow-up am labs.   Phillis Knack, PharmD, BCPS   08/15/2014,1:24 AM

## 2014-08-15 NOTE — Progress Notes (Signed)
TR BAND REMOVAL  LOCATION:    right radial  DEFLATED PER PROTOCOL:    Yes.    TIME BAND OFF / DRESSING APPLIED:    20:00   SITE UPON ARRIVAL:    Level 0  SITE AFTER BAND REMOVAL:    Level 0  REVERSE ALLEN'S TEST:     positive  CIRCULATION SENSATION AND MOVEMENT:    Within Normal Limits   Yes.    COMMENTS:

## 2014-08-15 NOTE — Progress Notes (Signed)
   08/15/14 1100  Clinical Encounter Type  Visited With Patient and family together;Health care provider  Visit Type Initial;Spiritual support;Social support  Spiritual Encounters  Spiritual Needs Prayer   Chaplain was referred to patient via spiritual care consult. Chaplain visited with patient and patient's family and friends for roughly 30 minutes today. Patient and patient's family were in good spirits today. Patient was awaiting a cath procedure but seemed to be enjoying the company he had while he waited. Chaplain shared in stories and laughter with patient and patient's family. 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 Patient asked for prayer before chaplain left and chaplain prayed with patient and family. Chaplain will continue to provide emotional and spiritual support for patient and patient's family as needed.  Gar Ponto, Chaplain  11:51 AM

## 2014-08-15 NOTE — Interval H&P Note (Signed)
History and Physical Interval Note:  08/15/2014 1:03 PM  [name]  has presented today for surgery, with the diagnosis of Unstable Angina.  The various methods of treatment have been discussed with the patient and family. After consideration of risks, benefits and other options for treatment, the patient has consented to  Procedure(s): LEFT HEART CATHETERIZATION WITH CORONARY ANGIOGRAM (N/A) +/- PCI as a surgical intervention .  The patient's history has been reviewed, patient examined, no change in status, stable for surgery.  I have reviewed the patient's chart and labs.  Questions were answered to the patient's satisfaction.    Cath Lab Visit (complete for each Cath Lab visit)  Clinical Evaluation Leading to the Procedure:   ACS: Yes.    Non-ACS:    Anginal Classification: CCS IV  Anti-ischemic medical therapy: Minimal Therapy (1 class of medications)  Non-Invasive Test Results: No non-invasive testing performed  Prior CABG: No previous CABG  HARDING, DAVID W   TIMI SCORE  Patient Information:  TIMI Score is 5   A/NSTEMI and high-risk features for short-term risk of death or nonfatal MI Revascularization of the presumed culprit artery  A (9)  Indication: 11; Score: 9 TIMI SCORE  Patient Information:  TIMI Score is 5   A/NSTEMI and high-risk features for short-term risk of death or nonfatal MI Revascularization of multiple coronary arteries when the culprit artery cannot clearly be determined  A (9)  Indication: 12; Score: 9

## 2014-08-16 ENCOUNTER — Encounter (HOSPITAL_COMMUNITY): Payer: Self-pay | Admitting: Cardiology

## 2014-08-16 LAB — CBC
HCT: 46.3 % (ref 39.0–52.0)
Hemoglobin: 15.6 g/dL (ref 13.0–17.0)
MCH: 32.8 pg (ref 26.0–34.0)
MCHC: 33.7 g/dL (ref 30.0–36.0)
MCV: 97.5 fL (ref 78.0–100.0)
Platelets: 150 10*3/uL (ref 150–400)
RBC: 4.75 MIL/uL (ref 4.22–5.81)
RDW: 13.1 % (ref 11.5–15.5)
WBC: 7.4 10*3/uL (ref 4.0–10.5)

## 2014-08-16 LAB — BASIC METABOLIC PANEL WITH GFR
Anion gap: 10 (ref 5–15)
BUN: 8 mg/dL (ref 6–23)
CO2: 26 mmol/L (ref 19–32)
Calcium: 9.4 mg/dL (ref 8.4–10.5)
Chloride: 106 mmol/L (ref 96–112)
Creatinine, Ser: 0.9 mg/dL (ref 0.50–1.35)
GFR calc Af Amer: >90 mL/min
GFR calc non Af Amer: 81 mL/min — ABNORMAL LOW
Glucose, Bld: 104 mg/dL — ABNORMAL HIGH (ref 70–99)
Potassium: 4.3 mmol/L (ref 3.5–5.1)
Sodium: 142 mmol/L (ref 135–145)

## 2014-08-16 MED ORDER — METOPROLOL TARTRATE 25 MG PO TABS
25.0000 mg | ORAL_TABLET | Freq: Two times a day (BID) | ORAL | Status: DC
  Administered 2014-08-16: 11:00:00 25 mg via ORAL
  Filled 2014-08-16: qty 1

## 2014-08-16 MED ORDER — TICAGRELOR 90 MG PO TABS: 90.0000 mg | ORAL_TABLET | Freq: Two times a day (BID) | ORAL | Status: DC

## 2014-08-16 MED ORDER — METOPROLOL TARTRATE 25 MG PO TABS: 25.0000 mg | ORAL_TABLET | Freq: Two times a day (BID) | ORAL | Status: DC

## 2014-08-16 MED FILL — Sodium Chloride IV Soln 0.9%: INTRAVENOUS | Qty: 50 | Status: AC

## 2014-08-16 NOTE — Progress Notes (Signed)
CARDIAC REHAB PHASE I   PRE:  Rate/Rhythm: 93 SR    BP: sitting 137/80    SaO2:   MODE:  Ambulation: 600 ft   POST:  Rate/Rhythm: 108 ST    BP: sitting 138/78     SaO2:   Tolerated well. Sts he feels much better than PTA, more energetic, no CP. Ed completed. Not interested in CRPII as he is active at the [facility]. Accepted brochure in case he changes his mind.  [id number]   Josephina Shih Irwin CES, ACSM 08/16/2014 9:39 AM

## 2014-08-16 NOTE — Care Management Note (Signed)
    Page 1 of 1   08/16/2014     9:07:16 AM CARE MANAGEMENT NOTE 08/16/2014  Patient:  [name], [name]   Account Number:  [id number]  Date Initiated:  08/16/2014  Documentation initiated by:  Whitman Hero  Subjective/Objective Assessment:   PTA from home admitted with unstable angina, s/p cardiac cath. /pci.     Action/Plan:   Return home when medically stable. CM  to f/u d/c needs   Anticipated DC Date:  08/16/2014   Anticipated DC Plan:  [place]  CM consult      Choice offered to / List presented to:             Status of service:  Completed, signed off Medicare Important Message given?  NA - LOS <3 / Initial given by admissions (If response is "NO", the following Medicare IM given date fields will be blank) Date Medicare IM given:   Medicare IM given by:   Date Additional Medicare IM given:   Additional Medicare IM given by:    Discharge Disposition:  HOME/SELF CARE  Per UR Regulation:  Reviewed for med. necessity/level of care/duration of stay  If discussed at [place] of Stay Meetings, dates discussed:    Comments:   08/16/2014  @ 0900 Whitman Hero RN,BSN,CM [phone]  CM spoke with pt about brilinta, 30-day free card given. Pt stated uses [facility] [phone]). CM called pharmacy to verify [place] in stock. Phamacy stated they do have medication in stock. Pt made aware per CM. No other needs identified per CM.

## 2014-08-16 NOTE — Discharge Instructions (Signed)
Call [place] at [phone] if any bleeding, swelling or drainage at cath site.  May shower, no tub baths for 48 hours for groin sticks. No lifting over 5 pounds for 3 days.  No Driving for 3 days.    HEART HEALTHY DIET  Bring all meds to office with you  We added Brilinta along with your asprin to keep your stent open. Do not stop  We added Lopressor because you have fast heart beats at times.

## 2014-08-16 NOTE — Progress Notes (Signed)
Subjective: No complaints  Objective: Vital signs in last 24 hours: Temp:  [97.8 F (36.6 C)-98.3 F (36.8 C)] 98.2 F (36.8 C) (04/20 0540) Pulse Rate:  [77-89] 84 (04/20 0540) Resp:  [17-26] 20 (04/20 0540) BP: (115-147)/(65-95) 144/95 mmHg (04/20 0540) SpO2:  [95 %-99 %] 97 % (04/20 0540) Weight:  [232 lb 2.3 oz (105.3 kg)] 232 lb 2.3 oz (105.3 kg) (04/20 0021) Weight change: 2.3 oz (0.065 kg) Last BM Date: 08/15/14 Intake/Output from previous day:  -1550 04/19 0701 - 04/20 0700 In: 600 [P.O.:600] Out: 2150 [Urine:2150] Intake/Output this shift:    PE: General:Pleasant affect, NAD Skin:Warm and dry, brisk capillary refill HEENT:normocephalic, sclera clear, mucus membranes moist Heart:S1S2 RRR without murmur, gallup, rub or click Lungs:clear without rales, rhonchi, or wheezes RDE:YCXK, non tender, + BS, do not palpate liver spleen or masses Ext:tr to 1+ lower ext edema, 2+ pedal pulses, 2+ radial pulses, cath site without hematoma  Neuro:alert and oriented, MAE, follows commands, + facial symmetry Tele: SR with PVCs then 10 beats NSVT while talking, no awareness   Lab Results:  Recent Labs  08/15/14 0304 08/16/14 0356  WBC 4.8 7.4  HGB 14.7 15.6  HCT 43.2 46.3  PLT 132* 150   BMET  Recent Labs  08/15/14 0304 08/16/14 0356  NA 143 142  K 4.3 4.3  CL 108 106  CO2 28 26  GLUCOSE 114* 104*  BUN 10 8  CREATININE 0.86 0.90  CALCIUM 8.8 9.4    Recent Labs  08/14/14 2040 08/15/14 0304  TROPONINI <0.03 <0.03    Lab Results  Component Value Date   CHOL 116 08/15/2014   HDL 37* 08/15/2014   LDLCALC 58 08/15/2014   TRIG 103 08/15/2014   CHOLHDL 3.1 08/15/2014   No results found for: HGBA1C   No results found for: TSH  Hepatic Function Panel  Recent Labs  08/14/14 1015  PROT 6.1  ALBUMIN 3.7  AST 22  ALT 18  ALKPHOS 80  BILITOT 1.0    Recent Labs  08/15/14 0304  CHOL 116   No results for input(s): PROTIME in the last  72 hours.     Studies/Results: Dg Chest 2 View  08/14/2014   CLINICAL DATA:  Chest pain  EXAM: CHEST  2 VIEW  COMPARISON:  01/06/2013  FINDINGS: The heart size and mediastinal contours are within normal limits. Both lungs are clear. The visualized skeletal structures are unremarkable.  IMPRESSION: No active cardiopulmonary disease.   Electronically Signed   By: Inez Catalina M.D.   On: 08/14/2014 11:13    Medications: I have reviewed the patient's current medications. Scheduled Meds: . aspirin EC  81 mg Oral Daily  . atorvastatin  40 mg Oral Daily  . cycloSPORINE  1 drop Both Eyes BID  . finasteride  5 mg Oral Daily  . irbesartan  300 mg Oral Daily  . levothyroxine  150 mcg Oral QAC breakfast  . loratadine  10 mg Oral Daily  . nitroGLYCERIN  0.5 inch Topical 4 times per day  . pantoprazole  40 mg Oral Daily  . sodium chloride  3 mL Intravenous Q12H  . ticagrelor  90 mg Oral BID  . vitamin C  1,000 mg Oral Daily  . Vitamin D3  1,000 Units Oral Daily   Continuous Infusions:  PRN Meds:.sodium chloride, acetaminophen, ALPRAZolam, morphine injection, nitroGLYCERIN, ondansetron (ZOFRAN) IV, sodium chloride, zolpidem  Assessment/Plan: 74 y.o. male with a history of  CAD. He had a remote LAD PCI in 1987 and a re do PCI in 1997 (Dr Melvern Banker). His last Myoview in December of 2012 showed an ejection fraction of 49%. There was scar with mild peri-infarct ischemia in the distal anteroseptal and apical wall. He presented PM 4/18 with SSx consistent with Unstable Angina & is referred for cardiac catheterization.   Cardiac cath :Successful PCI of proximal LAD - Xience Alpine DES 3.0 mm x 28 mm -- post-dilated in tapered fashion to 4.1 mm prox & 3.8 mm distal (@ stent overlap  EF 55% otherwise non obstructive disease of 20%   Principal Problem:   Unstable angina- resolved neg. troponins Active Problems:   CAD S/P PCI LAD 1987, `1997, 08/15/14 PCI pLAD DES- EKG stable, labs stable. On statin, ARB    NSVT 10 beats- this AM-- not on BB add now    Hypothyroidism   Dyslipidemia   Centrilobular emphysema   BPH (benign prostatic hyperplasia)   RBBB   Psoriasis   Chest pain with high risk of acute coronary syndrome   Essential hypertension  Pt ambulating without problems, add BB, otherwise he has had PVCs  plan to d/c today if MD agrees. 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   LOS: 2 days   Time spent with pt. :15 minutes. [facility] R  Nurse Practitioner Certified Pager [phone] or after 5pm and on weekends call [phone] 08/16/2014, 7:31 AM   Patient seen, examined. Available data reviewed. Agree with findings, assessment, and plan as outlined by Cecilie Kicks, PA-C. The patient feels great with no recurrence of chest pain. Right radial site clear. Heart RRR without murmur or gallop. Plan d/c this am. Continue B-blocker. DAPT with ASA and brilinta. Other meds reviewed and will be continued. FU Dr Stanford Breed or PA/NP in 2 weeks.  Sherren Mocha, M.D. 08/16/2014 10:38 AM

## 2014-08-16 NOTE — Discharge Summary (Signed)
Physician Discharge Summary       Patient ID: [name] MRN: [id number] DOB/AGE: 09/13/1940 74 y.o.  Admit date: 08/14/2014 Discharge date: 08/16/2014   Primary Cardiologist: Dr. Stanford Breed   Discharge Diagnoses:  Principal Problem:   Unstable angina Active Problems:   CAD S/P PCI LAD 1987, `1997, 08/15/14 PCI pLAD DES   Hypothyroidism   Dyslipidemia   Centrilobular emphysema   BPH (benign prostatic hyperplasia)   RBBB   Psoriasis   Chest pain with high risk of acute coronary syndrome   Essential hypertension   Discharged Condition: good  Procedures: 08/15/14 cardiac cath and PCI with DES by Dr. Ellyn Hack.  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 He had a remote LAD PCI in 1987 and a re do PCI in 1997 (Dr Melvern Banker). His last Myoview in December of 2012 showed an ejection fraction of 49%. There was scar with mild peri-infarct ischemia in the distal anteroseptal and apical wall. He was asymptomatic and has been doing well on medical Rx. Sunday at church he noted SSCP "the begining of chest pressure" similar to his pre PCI symptoms. He took a spray of NTG with some relief, then another spray an hour later with almost complete relief. Today he noted some "esophageal burning". He called his PCP (DR Shelia Media) and was instructed to come to the ED for further evaluation. He has a chronic RBBB. His first Troponin and all troponins were negative.   He was admitted and underwent cardiac cath finding ~80% stenosis in the transition from proximal to mid LAD (involving the takeoff of 2 small diagonal branches and septal perforators. Following the initial significant lesion, there is diffuse ~40-50% stenosis in the more proximal stent (In-stent stenosis). The marked BMS in the mid LAD is widely patent. Beyond the previous stented segment, the vessel gives off a major diagonal branch. Distally, the vessel barely reaches the apex.  He underwent PCI with DES  Xience Alpine without complications.  EF 55%.  By the next AM he ambulated without complications.  He is seen by Dr. Burt Knack and found stable for discharge.  He did have 10 beats of NSVT- asymptomatic.  I added BB with recurrent disease.  He will follow up with APP and then Dr. Stanford Breed.    Consults: None  Significant Diagnostic Studies:  BMET    Component Value Date/Time   NA 142 08/16/2014 0356   K 4.3 08/16/2014 0356   CL 106 08/16/2014 0356   CO2 26 08/16/2014 0356   GLUCOSE 104* 08/16/2014 0356   BUN 8 08/16/2014 0356   CREATININE 0.90 08/16/2014 0356   CALCIUM 9.4 08/16/2014 0356   GFRNONAA 81* 08/16/2014 0356   GFRAA >90 08/16/2014 0356    CBC    Component Value Date/Time   WBC 7.4 08/16/2014 0356   RBC 4.75 08/16/2014 0356   HGB 15.6 08/16/2014 0356   HCT 46.3 08/16/2014 0356   PLT 150 08/16/2014 0356   MCV 97.5 08/16/2014 0356   MCH 32.8 08/16/2014 0356   MCHC 33.7 08/16/2014 0356   RDW 13.1 08/16/2014 0356   LYMPHSABS 1.1 06/25/2010 0505   MONOABS 0.7 06/25/2010 0505   EOSABS 0.2 06/25/2010 0505   BASOSABS 0.0 06/25/2010 0505   Lipid Panel     Component Value Date/Time   CHOL 116 08/15/2014 0304   TRIG 103 08/15/2014 0304   HDL 37* 08/15/2014 0304   CHOLHDL 3.1 08/15/2014 0304   VLDL 21 08/15/2014 0304  [place] 58 08/15/2014 0304   Troponin <0.30  CHEST 2 VIEW COMPARISON: 01/06/2013 FINDINGS: The heart size and mediastinal contours are within normal limits. Both lungs are clear. The visualized skeletal structures are unremarkable. IMPRESSION: No active cardiopulmonary disease.   EKG RBBB SR and no acute changes  Hemodynamics:   Central Aortic Pressure / Mean: 101/60/80 mmHg  Left Ventricular Pressure / LVEDP: 102/5/12 mmHg  Left Ventriculography:  EF: ~55 %  Wall Motion: mostly normal with mild inferoapical hypokinesis.  Coronary Anatomy:  Dominance: Co-dominant  Left Main: Large caliber vessel, bifurcates into LAD and  Circumflex. Angiographically normal.  LAD: Large caliber vessel with ~80% stenosis in the transition from proximal to mid LAD (involving the takeoff of 2 small diagonal branches and septal perforators. Following the initial significant lesion, there is diffuse ~40-50% stenosis in the more proximal stent (In-stent stenosis). The marked BMS in the mid LAD is widely patent. Beyond the previous stented segment, the vessel gives off a major diagonal branch. Distally, the vessel barely reaches the apex.  D1 & 2: small caliber proximal branches, ostial involvement with LAD lesion. Too small for PCI  D3: moderate caliber vessel, luminal irregularities..  Left Circumflex: Large caliber, co-dominant vessel that bifurcates into a large OM1 and the distal AV Groove that bifurcates into 2 LPL branches.  OM1: Large caliber vessel that bifurcates into OM1 and OM2. Prior to the bifurcation there is a short eccentric ~20%. OM1 & 2 are moderate caliber tortuous vessels with no significant disease.  LPL System: 2 small to moderate caliber LPL branches with no significant disease   RCA: Large caliber, co-dominant vessel with proximal ~20% stenosis. The vessel terminates as a large Posterior Descending Artery with minimal luminal irregularities.  After reviewing the initial angiography, the culprit lesion was thought to be the ~80 % proximal LAD lesion along with the Instent-restenosis distally. Preparation were made to proceed with PCI on this lesion.  Percutaneous Coronary Intervention: 80% prox LAD (TIMI 3 flow) reduced to 0% (TIMI 3 flow) Guide: 6 Fr XB LAD 3.5Guidewire: BMW Predilation Balloon: Euphora 2.5 mm x 15 mm;   10 Atm x 30 Sec Stent: Xience Alpine DES 3.0 mm x 28 mm; from SP1 to the proximal edge of the marked stent.  16 Atm x 30 Sec, 18 Atm x 45 Sec - final distal diameter ~3.5 mm Post-dilation Balloon: [place] Emerge 4.0 mm x 20 mm;   6 Atm x 30 Sec - distal stent edge into  marked stent -- diamter ~3.8 mm  16 Atm x 45 Atm - proximal stent - Final Diameter: 4.1 mm  Small diagonal branches (that had already been jailed - now with more pronounced ~60-70% ostial stenoses & TIMI 3 flow  Post deployment angiography in multiple views, with and without guidewire in place revealed adequate stent deployment and lesion coverage. There was no evidence of dissection or perforation.   POST-OPERATIVE DIAGNOSIS:   Severe single vessel CAD with ~proximal LAD ~80% stenosis just proximal to 2 BMS stents.   Successful PCI of proximal LAD - Xience Alpine DES 3.0 mm x 28 mm -- post-dilated in tapered fashion to 4.1 mm prox & 3.8 mm distal (@ stent overlap)  Relatively preserved LVEF with normal LVEDP.  PLAN OF CARE:  Standard post-radial PCI care with TR band removal.   Angiomax to stop after current bag.  May have post PCI angina from jailed Diagonal branches - treat with nitrates.  DAPT x minimum of 1 yr, can switch  to Plavix if needed.  Risk Factor Treatment.  Anticipate d/c tomorrow.    Discharge Exam: Blood pressure 144/95, pulse 99, temperature 97.9 F (36.6 C), temperature source Oral, resp. rate 18, height 5' 10.5" (1.791 m), weight 232 lb 2.3 oz (105.3 kg), SpO2 98 %.  Disposition: HOME     Medication List    TAKE these medications        aspirin EC 81 MG tablet  Take 81 mg by mouth daily.     atorvastatin 40 MG tablet  Commonly known as:  LIPITOR  Take 40 mg by mouth daily.     cycloSPORINE 0.05 % ophthalmic emulsion  Commonly known as:  RESTASIS  Place 1 drop into both eyes 2 (two) times daily.     fexofenadine 180 MG tablet  Commonly known as:  ALLEGRA  Take 180 mg by mouth daily.     finasteride 5 MG tablet  Commonly known as:  PROSCAR  Take 5 mg by mouth daily.     fluticasone 0.05 % cream  Commonly known as:  CUTIVATE  Apply 1 application topically every morning.     GLUCOSAMINE 1500 COMPLEX PO  Take 2 capsules by  mouth daily.     irbesartan 300 MG tablet  Commonly known as:  AVAPRO  Take 300 mg by mouth daily.     levothyroxine 150 MCG tablet  Commonly known as:  SYNTHROID, LEVOTHROID  Take 150 mcg by mouth daily.     metoprolol tartrate 25 MG tablet  Commonly known as:  LOPRESSOR  Take 1 tablet (25 mg total) by mouth 2 (two) times daily.     nitroGLYCERIN 0.4 MG/SPRAY spray  Commonly known as:  NITROLINGUAL  Place 1 spray under the tongue every 5 (five) minutes x 3 doses as needed for chest pain. Use as directed as needed     pantoprazole 40 MG tablet  Commonly known as:  PROTONIX  Take 30-60 min before first meal of the day     SENIOR MULTIVITAMIN PLUS PO  Once a day     ticagrelor 90 MG Tabs tablet  Commonly known as:  BRILINTA  Take 1 tablet (90 mg total) by mouth 2 (two) times daily.     vitamin C 1000 MG tablet  Take 1,000 mg by mouth daily.     Vitamin D3 2000 UNITS Tabs  Take 1,000 Units by mouth daily. Once a day           Follow-up Information    Follow up with Lyda Jester, PA-C On 09/06/2014.   Specialty:  Cardiology   Why:  at 4:00Pm   Contact information:   [address] [place] [address] [phone]       Follow up with Kirk Ruths, MD On 10/31/2014.   Specialty:  Cardiology   Why:  AT 4:00pm   Contact information:   [address] [place] [address] [phone]        Discharge Instructions: Call [facility] at [phone] if any bleeding, swelling or drainage at cath site.  May shower, no tub baths for 48 hours for groin sticks. No lifting over 5 pounds for 3 days.  No Driving for 3 days.    HEART HEALTHY DIET  Bring all meds to office with you  We added Brilinta along with your asprin to keep your stent open. Do not stop  We added Lopressor because you have fast heart beats at times.  Signed: Isaiah Serge Nurse Practitioner-Certified [facility]:  HEART[facility]ARE 08/16/2014, 10:02 AM  Time spent on discharge : > 30 minutes.

## 2014-08-16 NOTE — Progress Notes (Signed)
Pt walked this morning in the hall approx 500 ft. without complications. V/S stable. HR=90's-106. O2 Sat=97% .

## 2014-08-22 ENCOUNTER — Ambulatory Visit (INDEPENDENT_AMBULATORY_CARE_PROVIDER_SITE_OTHER): Payer: Medicare Other

## 2014-08-22 DIAGNOSIS — J309 Allergic rhinitis, unspecified: Secondary | ICD-10-CM | POA: Diagnosis not present

## 2014-08-25 ENCOUNTER — Ambulatory Visit (INDEPENDENT_AMBULATORY_CARE_PROVIDER_SITE_OTHER): Payer: Medicare Other

## 2014-08-25 DIAGNOSIS — J309 Allergic rhinitis, unspecified: Secondary | ICD-10-CM | POA: Diagnosis not present

## 2014-08-28 DIAGNOSIS — E039 Hypothyroidism, unspecified: Secondary | ICD-10-CM | POA: Diagnosis not present

## 2014-08-28 DIAGNOSIS — R5383 Other fatigue: Secondary | ICD-10-CM | POA: Diagnosis not present

## 2014-08-28 DIAGNOSIS — Z7901 Long term (current) use of anticoagulants: Secondary | ICD-10-CM | POA: Diagnosis not present

## 2014-08-28 DIAGNOSIS — Z955 Presence of coronary angioplasty implant and graft: Secondary | ICD-10-CM | POA: Diagnosis not present

## 2014-08-28 DIAGNOSIS — I2510 Atherosclerotic heart disease of native coronary artery without angina pectoris: Secondary | ICD-10-CM | POA: Diagnosis not present

## 2014-08-29 ENCOUNTER — Ambulatory Visit (INDEPENDENT_AMBULATORY_CARE_PROVIDER_SITE_OTHER): Payer: Medicare Other

## 2014-08-29 DIAGNOSIS — J309 Allergic rhinitis, unspecified: Secondary | ICD-10-CM | POA: Diagnosis not present

## 2014-08-30 ENCOUNTER — Telehealth: Payer: Self-pay | Admitting: Cardiology

## 2014-08-30 NOTE — Telephone Encounter (Signed)
Received records from [facility] for appointment on 09/06/14 with Ellen Henri, [place].  Records given to [employer] (medical records) for Brittany;s schedule on 09/06/14. lp

## 2014-09-01 DIAGNOSIS — R109 Unspecified abdominal pain: Secondary | ICD-10-CM | POA: Diagnosis not present

## 2014-09-01 DIAGNOSIS — Z7901 Long term (current) use of anticoagulants: Secondary | ICD-10-CM | POA: Diagnosis not present

## 2014-09-01 DIAGNOSIS — K625 Hemorrhage of anus and rectum: Secondary | ICD-10-CM | POA: Diagnosis not present

## 2014-09-01 DIAGNOSIS — I2510 Atherosclerotic heart disease of native coronary artery without angina pectoris: Secondary | ICD-10-CM | POA: Diagnosis not present

## 2014-09-01 DIAGNOSIS — I10 Essential (primary) hypertension: Secondary | ICD-10-CM | POA: Diagnosis not present

## 2014-09-05 ENCOUNTER — Ambulatory Visit (INDEPENDENT_AMBULATORY_CARE_PROVIDER_SITE_OTHER): Payer: Medicare Other

## 2014-09-05 DIAGNOSIS — J309 Allergic rhinitis, unspecified: Secondary | ICD-10-CM

## 2014-09-06 ENCOUNTER — Ambulatory Visit (INDEPENDENT_AMBULATORY_CARE_PROVIDER_SITE_OTHER): Payer: Medicare Other | Admitting: Cardiology

## 2014-09-06 ENCOUNTER — Encounter: Payer: Self-pay | Admitting: Cardiology

## 2014-09-06 VITALS — BP 104/66 | HR 81 | Ht 70.5 in | Wt 239.6 lb

## 2014-09-06 DIAGNOSIS — I2510 Atherosclerotic heart disease of native coronary artery without angina pectoris: Secondary | ICD-10-CM

## 2014-09-06 DIAGNOSIS — Z9861 Coronary angioplasty status: Secondary | ICD-10-CM | POA: Diagnosis not present

## 2014-09-06 MED ORDER — IRBESARTAN 150 MG PO TABS: 150.0000 mg | ORAL_TABLET | Freq: Every day | ORAL | Status: DC

## 2014-09-06 NOTE — Patient Instructions (Addendum)
Your physician recommends that you schedule a follow-up appointment Keep Appointment with Dr Stanford Breed on 10/31/2014  Your physician recommends that you schedule a follow-up appointment in: 2 weeks for Blood Pressure check on Nurse Schedule  Your physician has recommended you make the following change in your medication: Decrease Avapro 150 mg daily

## 2014-09-06 NOTE — Progress Notes (Signed)
09/06/2014 Jearld Lesch   [birth year]  [id number]  Primary Physician Londell Moh, MD Primary Cardiologist: Dr. 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 He recently was admitted to [facility] for chest pain consistent with unstable angina. Cardiac enzymes were negative. He underwent cardiac catheterization which demonstrated severe single-vessel CAD with 80% proximal LAD stenosis. He underwent successful PCI with DES Xience Alpine without complications. EF was normal at 55%. He was discharged home on dual antiplatelet therapy with aspirin plus Brilinta along with Metroprolol, Avapro and atorvastatin.  He presents to clinic today for post hospital follow-up. Since discharge, he reports that he has done well. No recurrent CP. He has occasional sensation of mild dyspnea (sounds to be related to Brilinta). No exertional dyspnea, orthopnea orPND.  He also notes fatigue/lack of energy and lower than normal BP. He reports full medication compliance.   Current Outpatient Prescriptions  Medication Sig Dispense Refill  . Ascorbic Acid (VITAMIN C) 1000 MG tablet Take 1,000 mg by mouth daily.    Marland Kitchen aspirin EC 81 MG tablet Take 81 mg by mouth daily.     Marland Kitchen atorvastatin (LIPITOR) 40 MG tablet Take 40 mg by mouth daily.      . Cholecalciferol (VITAMIN D3) 2000 UNITS TABS Take 1,000 Units by mouth daily. Once a day    . cycloSPORINE (RESTASIS) 0.05 % ophthalmic emulsion Place 1 drop into both eyes 2 (two) times daily.      . fexofenadine (ALLEGRA) 180 MG tablet Take 180 mg by mouth daily.      . finasteride (PROSCAR) 5 MG tablet Take 5 mg by mouth daily.      . fluticasone (CUTIVATE) 0.05 % cream Apply 1 application topically every morning.  2  . Glucosamine-Chondroit-Vit C-Mn (GLUCOSAMINE 1500 COMPLEX PO) Take 2 capsules by mouth  daily.    . irbesartan (AVAPRO) 150 MG tablet Take 1 tablet (150 mg total) by mouth daily. 30 tablet 11  . levothyroxine (SYNTHROID, LEVOTHROID) 150 MCG tablet Take 150 mcg by mouth daily.      . metoprolol tartrate (LOPRESSOR) 25 MG tablet Take 1 tablet (25 mg total) by mouth 2 (two) times daily. 60 tablet 6  . Multiple Vitamins-Minerals (SENIOR MULTIVITAMIN PLUS PO) Once a day     . nitroGLYCERIN (NITROLINGUAL) 0.4 MG/SPRAY spray Place 1 spray under the tongue every 5 (five) minutes x 3 doses as needed for chest pain. Use as directed as needed 12 g 12  . pantoprazole (PROTONIX) 40 MG tablet Take 30-60 min before first meal of the day    . ticagrelor (BRILINTA) 90 MG TABS tablet Take 1 tablet (90 mg total) by mouth 2 (two) times daily. 60 tablet 11   No current facility-administered medications for this visit.    No Known Allergies  History   Social History  . Marital Status: Married    Spouse Name: N/A  . Number of Children: N/A  . Years of Education: N/A   Occupational History  . Minister    Social History Main Topics  . Smoking status: Former Smoker -- 3.00 packs/day for 33 years    Types: Pipe, Cigars    Quit date: 04/28/1985  . Smokeless tobacco: Not on file  . Alcohol Use: 1.2 oz/week    1 Glasses  of wine, 1 Shots of liquor per week  . Drug Use: No  . Sexual Activity: Not on file   Other Topics Concern  . Not on file   Social History Narrative     Review of Systems: General: negative for chills, fever, night sweats or weight changes.  Cardiovascular: negative for chest pain, dyspnea on exertion, edema, orthopnea, palpitations, paroxysmal nocturnal dyspnea or shortness of breath Dermatological: negative for rash Respiratory: negative for cough or wheezing Urologic: negative for hematuria Abdominal: negative for nausea, vomiting, diarrhea, bright red blood per rectum, melena, or hematemesis Neurologic: negative for visual changes, syncope, or dizziness All other  systems reviewed and are otherwise negative except as noted above.    Blood pressure 104/66, pulse 81, height 5' 10.5" (1.791 m), weight 239 lb 9.6 oz (108.682 kg).  General appearance: alert, cooperative and no distress Neck: no carotid bruit and no JVD Lungs: clear to auscultation bilaterally Heart: regular rate and rhythm, S1, S2 normal, no murmur, click, rub or gallop Extremities: no LEE Pulses: 2+ and symmetric Skin: warm and dry Neurologic: Grossly normal  EKG NSR with chronic RBBB HR 81 bpm  ASSESSMENT AND PLAN:   1. CAD: s/p recent PCI + DES to proximal LAD. Denies any recurrent chest pain. Continue dual antiplatelet therapy with aspirin plus Brilinta for a minimum of one year given newly placed drug-eluting stent. Continue statin and beta blocker therapy.  2. Mild Sensation of Dyspnea: tolerable. Sounds to be possible medication induced by Brilinta. Recommended coffee. Shoule improve with time.  3. Fatigue/Decreased Energy: patient thinks his lower than normal BP is the cause. BP today is soft but stable at 104/66. HR is 81 bpm.  Metoprolol was recently added for his CAD. He has normal LVF. We will decrease is Avapro from 300 to 150 mg daily. We will see if keeping his BP a bit higher will help with symptoms. He will return in 1 week for BP check with RN.    PLAN  Continue plan as outlined above. Keep f/u with Dr. Jens Som on 10/31/14.  Zaliah Wissner, BRITTAINYPA-C 09/06/2014 4:51 PM

## 2014-09-11 NOTE — Addendum Note (Signed)
Addended by: Diana Eves on: 09/11/2014 01:10 PM   Modules accepted: Orders

## 2014-09-12 ENCOUNTER — Ambulatory Visit (INDEPENDENT_AMBULATORY_CARE_PROVIDER_SITE_OTHER): Payer: Medicare Other

## 2014-09-12 DIAGNOSIS — J309 Allergic rhinitis, unspecified: Secondary | ICD-10-CM

## 2014-09-13 DIAGNOSIS — K219 Gastro-esophageal reflux disease without esophagitis: Secondary | ICD-10-CM | POA: Diagnosis not present

## 2014-09-13 DIAGNOSIS — R490 Dysphonia: Secondary | ICD-10-CM | POA: Diagnosis not present

## 2014-09-19 ENCOUNTER — Ambulatory Visit (INDEPENDENT_AMBULATORY_CARE_PROVIDER_SITE_OTHER): Payer: Medicare Other

## 2014-09-19 DIAGNOSIS — I10 Essential (primary) hypertension: Secondary | ICD-10-CM | POA: Diagnosis not present

## 2014-09-19 DIAGNOSIS — R5383 Other fatigue: Secondary | ICD-10-CM | POA: Diagnosis not present

## 2014-09-19 DIAGNOSIS — J309 Allergic rhinitis, unspecified: Secondary | ICD-10-CM | POA: Diagnosis not present

## 2014-09-19 DIAGNOSIS — E039 Hypothyroidism, unspecified: Secondary | ICD-10-CM | POA: Diagnosis not present

## 2014-09-20 ENCOUNTER — Ambulatory Visit (INDEPENDENT_AMBULATORY_CARE_PROVIDER_SITE_OTHER): Payer: Medicare Other | Admitting: *Deleted

## 2014-09-20 ENCOUNTER — Encounter: Payer: Self-pay | Admitting: *Deleted

## 2014-09-20 VITALS — BP 100/60 | HR 80 | Wt 237.1 lb

## 2014-09-20 DIAGNOSIS — I10 Essential (primary) hypertension: Secondary | ICD-10-CM

## 2014-09-20 NOTE — Patient Instructions (Signed)
No change with current medications.

## 2014-09-20 NOTE — Progress Notes (Signed)
Blood pressure check today  Patient states he saw primary- Dr Shelia Media yesterday (09/19/14) for routine appointment. Patient states  Blood pressure was 126/66. Dr Shelia Media discontinue avapro dose yesterday and decrease levothyroxine . Patient states he feels so much better. [facility] to the[facility] . B/p 126/60 at the [facility]  Blood pressure and other vitals obtained ,see vital signs area.  Patient aware to keep appointment with Dr Stanford Breed 10/31/14 at 4 pm.

## 2014-09-26 ENCOUNTER — Ambulatory Visit (INDEPENDENT_AMBULATORY_CARE_PROVIDER_SITE_OTHER): Payer: Medicare Other

## 2014-09-26 ENCOUNTER — Telehealth: Payer: Self-pay | Admitting: Internal Medicine

## 2014-09-26 DIAGNOSIS — J309 Allergic rhinitis, unspecified: Secondary | ICD-10-CM

## 2014-09-26 MED ORDER — ALBUTEROL SULFATE HFA 108 (90 BASE) MCG/ACT IN AERS: 1.0000 | INHALATION_SPRAY | Freq: Four times a day (QID) | RESPIRATORY_TRACT | Status: AC | PRN

## 2014-09-26 NOTE — Telephone Encounter (Signed)
Pt is requesting albuterol inhaler but it is not on his list. May we send rx to pharmacy?  CY please advise.

## 2014-09-26 NOTE — Telephone Encounter (Signed)
Yes thanks Albuterol hfa, # 1, 2 puffs every 4-6 hours as directed, refill x 11

## 2014-09-26 NOTE — Telephone Encounter (Signed)
Albuterol neb sent to preferred pharmacy.  Nothing further needed.

## 2014-10-17 ENCOUNTER — Encounter: Payer: Self-pay | Admitting: Cardiology

## 2014-10-17 ENCOUNTER — Ambulatory Visit (INDEPENDENT_AMBULATORY_CARE_PROVIDER_SITE_OTHER): Payer: Medicare Other

## 2014-10-17 DIAGNOSIS — I10 Essential (primary) hypertension: Secondary | ICD-10-CM | POA: Diagnosis not present

## 2014-10-17 DIAGNOSIS — Z0000 Encounter for general adult medical examination without abnormal findings: Secondary | ICD-10-CM | POA: Diagnosis not present

## 2014-10-17 DIAGNOSIS — Z125 Encounter for screening for malignant neoplasm of prostate: Secondary | ICD-10-CM | POA: Diagnosis not present

## 2014-10-17 DIAGNOSIS — J309 Allergic rhinitis, unspecified: Secondary | ICD-10-CM

## 2014-10-17 DIAGNOSIS — E039 Hypothyroidism, unspecified: Secondary | ICD-10-CM | POA: Diagnosis not present

## 2014-10-17 DIAGNOSIS — E780 Pure hypercholesterolemia: Secondary | ICD-10-CM | POA: Diagnosis not present

## 2014-10-23 ENCOUNTER — Other Ambulatory Visit: Payer: Self-pay

## 2014-10-24 ENCOUNTER — Ambulatory Visit (INDEPENDENT_AMBULATORY_CARE_PROVIDER_SITE_OTHER): Payer: Medicare Other

## 2014-10-24 DIAGNOSIS — I2510 Atherosclerotic heart disease of native coronary artery without angina pectoris: Secondary | ICD-10-CM | POA: Diagnosis not present

## 2014-10-24 DIAGNOSIS — J309 Allergic rhinitis, unspecified: Secondary | ICD-10-CM

## 2014-10-24 DIAGNOSIS — E780 Pure hypercholesterolemia: Secondary | ICD-10-CM | POA: Diagnosis not present

## 2014-10-24 DIAGNOSIS — G4700 Insomnia, unspecified: Secondary | ICD-10-CM | POA: Diagnosis not present

## 2014-10-29 NOTE — Progress Notes (Signed)
HPI: FU CAD. Patient has had previous PCI of his LAD. His last echocardiogram in Oct 2009 showed an ejection fraction of 50-55% and trace aortic insufficiency. Myoview in December of 2012 showed an ejection fraction of 49%. There was scar with mild peri-infarct ischemia in the distal anteroseptal and apical wall. Abdominal ultrasound in January of 2014 showed no aneurysm. Patient admitted in April 2016 with chest pain. Cardiac catheterization revealed an ejection fraction of 55%. There is an 80% LAD followed by 40-50% lesion. The patient had PCI of his LAD with a drug-eluting stent. Since last seen, patient does describe some dyspnea on exertion and with bending over. He denies chest pain.   Current Outpatient Prescriptions  Medication Sig Dispense Refill  . albuterol (PROVENTIL HFA;VENTOLIN HFA) 108 (90 BASE) MCG/ACT inhaler Inhale 1-2 puffs into the lungs every 6 (six) hours as needed for wheezing or shortness of breath. 1 Inhaler 11  . Ascorbic Acid (VITAMIN C) 1000 MG tablet Take 1,000 mg by mouth daily.    Marland Kitchen aspirin EC 81 MG tablet Take 81 mg by mouth daily.     Marland Kitchen atorvastatin (LIPITOR) 40 MG tablet Take 40 mg by mouth daily.      . Cholecalciferol (VITAMIN D3) 2000 UNITS TABS Take 1,000 Units by mouth daily. Once a day    . cycloSPORINE (RESTASIS) 0.05 % ophthalmic emulsion Place 1 drop into both eyes 2 (two) times daily.      . eszopiclone (LUNESTA) 2 MG TABS tablet Take 0.5 tablets by mouth as needed.    . fexofenadine (ALLEGRA) 180 MG tablet Take 180 mg by mouth daily.      . finasteride (PROSCAR) 5 MG tablet Take 5 mg by mouth daily.      . fluticasone (CUTIVATE) 0.05 % cream Apply 1 application topically every morning.  2  . Glucosamine-Chondroit-Vit C-Mn (GLUCOSAMINE 1500 COMPLEX PO) Take 2 capsules by mouth daily.    Marland Kitchen levothyroxine (SYNTHROID, LEVOTHROID) 125 MCG tablet Take 125 mcg by mouth daily before breakfast.    . metoprolol tartrate (LOPRESSOR) 25 MG tablet Take 1  tablet (25 mg total) by mouth 2 (two) times daily. 60 tablet 6  . Multiple Vitamins-Minerals (SENIOR MULTIVITAMIN PLUS PO) Once a day     . nitroGLYCERIN (NITROLINGUAL) 0.4 MG/SPRAY spray Place 1 spray under the tongue every 5 (five) minutes x 3 doses as needed for chest pain. Use as directed as needed 12 g 12  . pantoprazole (PROTONIX) 40 MG tablet Take 30-60 min before first meal of the day    . ticagrelor (BRILINTA) 90 MG TABS tablet Take 1 tablet (90 mg total) by mouth 2 (two) times daily. 60 tablet 11   No current facility-administered medications for this visit.     Past Medical History  Diagnosis Date  . Hyperlipidemia   . Hiatal hernia   . GERD (gastroesophageal reflux disease)   . Hypothyroidism   . Allergic asthma   . COPD (chronic obstructive pulmonary disease)   . CAD (coronary artery disease)   . BPH (benign prostatic hyperplasia)   . Psoriasis   . Barrett's esophagus     Past Surgical History  Procedure Laterality Date  . Left knee arthroscopy    . Tonsillectomy    . Nasal sinus surgery    . Nasal polyp excision  1953  . Left heart catheterization with coronary angiogram N/A 08/15/2014    Procedure: LEFT HEART CATHETERIZATION WITH CORONARY ANGIOGRAM;  Surgeon: Peter M Martinique,  MD;  Location: [facility];  Service: Cardiovascular;  Laterality: N/A;  . Coronary angioplasty with stent placement  08/15/14    PCI of pLAD Xience alpine DES    History   Social History  . Marital Status: Married    Spouse Name: N/A  . Number of Children: N/A  . Years of Education: N/A   Occupational History  . Minister    Social History Main Topics  . Smoking status: Former Smoker -- 3.00 packs/day for 33 years    Types: Pipe, Cigars    Quit date: 04/28/1985  . Smokeless tobacco: Not on file  . Alcohol Use: 1.2 oz/week    1 Glasses of wine, 1 Shots of liquor per week  . Drug Use: No  . Sexual Activity: Not on file   Other Topics Concern  . Not on file   Social History  Narrative    ROS: no fevers or chills, productive cough, hemoptysis, dysphasia, odynophagia, melena, hematochezia, dysuria, hematuria, rash, seizure activity, orthopnea, PND, pedal edema, claudication. Remaining systems are negative.  Physical Exam: Well-developed well-nourished in no acute distress.  Skin is warm and dry.  HEENT is normal.  Neck is supple.  Chest is clear to auscultation with normal expansion.  Cardiovascular exam is regular rate and rhythm.  Abdominal exam nontender or distended. No masses palpated. Extremities show no edema. neuro grossly intact

## 2014-10-31 ENCOUNTER — Ambulatory Visit (INDEPENDENT_AMBULATORY_CARE_PROVIDER_SITE_OTHER): Payer: Medicare Other

## 2014-10-31 ENCOUNTER — Ambulatory Visit (INDEPENDENT_AMBULATORY_CARE_PROVIDER_SITE_OTHER): Payer: Medicare Other | Admitting: Cardiology

## 2014-10-31 ENCOUNTER — Encounter: Payer: Self-pay | Admitting: Cardiology

## 2014-10-31 VITALS — BP 130/76 | HR 90 | Ht 70.0 in | Wt 236.0 lb

## 2014-10-31 DIAGNOSIS — I2510 Atherosclerotic heart disease of native coronary artery without angina pectoris: Secondary | ICD-10-CM

## 2014-10-31 DIAGNOSIS — Z9861 Coronary angioplasty status: Secondary | ICD-10-CM | POA: Diagnosis not present

## 2014-10-31 DIAGNOSIS — E785 Hyperlipidemia, unspecified: Secondary | ICD-10-CM

## 2014-10-31 DIAGNOSIS — I10 Essential (primary) hypertension: Secondary | ICD-10-CM

## 2014-10-31 DIAGNOSIS — J309 Allergic rhinitis, unspecified: Secondary | ICD-10-CM | POA: Diagnosis not present

## 2014-10-31 MED ORDER — CLOPIDOGREL BISULFATE 75 MG PO TABS: 75.0000 mg | ORAL_TABLET | Freq: Every day | ORAL | Status: DC

## 2014-10-31 MED ORDER — ATORVASTATIN CALCIUM 80 MG PO TABS: 80.0000 mg | ORAL_TABLET | Freq: Every day | ORAL | Status: DC

## 2014-10-31 NOTE — Assessment & Plan Note (Signed)
Continue aspirin and statin. He is describing dyspnea that may be related to his brilinta. We will discontinue and instead treat with Plavix 75 mg daily. Hopefully his dyspnea will improve. Note he is not having chest pain.

## 2014-10-31 NOTE — Assessment & Plan Note (Signed)
Change Lipitor to 80 mg daily. Check lipids and liver in 4 weeks. 

## 2014-10-31 NOTE — Patient Instructions (Signed)
Your physician recommends that you schedule a follow-up appointment in: [facility] [place] wants you to follow-up in: [place] will receive a reminder letter in the mail two months in advance. If you don't receive a letter, please call our office to schedule the follow-up appointment.    STOP BRILINTA  START PLAVIX 75 MG ONCE DAILY  INCREASE ATORVASTATIN TO 80 MG ONCE DAILY= 2 OF THE 40 MG TABLETS ONCE DAILY  Your physician recommends that you return for lab work in: 4 WEEKS=DO NOT EAT PRIOR TO LAB WORK

## 2014-10-31 NOTE — Assessment & Plan Note (Signed)
Continue present blood pressure medications. 

## 2014-11-02 DIAGNOSIS — Z9861 Coronary angioplasty status: Secondary | ICD-10-CM | POA: Diagnosis not present

## 2014-11-02 DIAGNOSIS — G4700 Insomnia, unspecified: Secondary | ICD-10-CM | POA: Diagnosis not present

## 2014-11-07 ENCOUNTER — Ambulatory Visit (INDEPENDENT_AMBULATORY_CARE_PROVIDER_SITE_OTHER): Payer: Medicare Other

## 2014-11-07 ENCOUNTER — Encounter: Payer: Self-pay | Admitting: Internal Medicine

## 2014-11-07 DIAGNOSIS — J309 Allergic rhinitis, unspecified: Secondary | ICD-10-CM

## 2014-11-14 ENCOUNTER — Ambulatory Visit (INDEPENDENT_AMBULATORY_CARE_PROVIDER_SITE_OTHER): Payer: Medicare Other

## 2014-11-14 DIAGNOSIS — J309 Allergic rhinitis, unspecified: Secondary | ICD-10-CM

## 2014-11-21 ENCOUNTER — Ambulatory Visit (INDEPENDENT_AMBULATORY_CARE_PROVIDER_SITE_OTHER): Payer: Medicare Other

## 2014-11-21 DIAGNOSIS — J309 Allergic rhinitis, unspecified: Secondary | ICD-10-CM | POA: Diagnosis not present

## 2014-11-27 DIAGNOSIS — E785 Hyperlipidemia, unspecified: Secondary | ICD-10-CM | POA: Diagnosis not present

## 2014-11-27 LAB — HEPATIC FUNCTION PANEL
ALT: 20 U/L (ref 9–46)
AST: 19 U/L (ref 10–35)
Albumin: 4.4 g/dL (ref 3.6–5.1)
Alkaline Phosphatase: 79 U/L (ref 40–115)
Bilirubin, Direct: 0.2 mg/dL (ref <=0.2)
Indirect Bilirubin: 0.6 mg/dL (ref 0.2–1.2)
TOTAL PROTEIN: 6.9 g/dL (ref 6.1–8.1)
Total Bilirubin: 0.8 mg/dL (ref 0.2–1.2)

## 2014-11-27 LAB — LIPID PANEL
Cholesterol: 126 mg/dL (ref 125–200)
HDL: 42 mg/dL (ref >=40)
LDL Cholesterol: 66 mg/dL (ref <130)
Total CHOL/HDL Ratio: 3 Ratio (ref <=5.0)
Triglycerides: 92 mg/dL (ref <150)
VLDL: 18 mg/dL (ref <30)

## 2014-11-28 ENCOUNTER — Ambulatory Visit (INDEPENDENT_AMBULATORY_CARE_PROVIDER_SITE_OTHER): Payer: Medicare Other

## 2014-11-28 DIAGNOSIS — J309 Allergic rhinitis, unspecified: Secondary | ICD-10-CM | POA: Diagnosis not present

## 2014-12-05 ENCOUNTER — Ambulatory Visit (INDEPENDENT_AMBULATORY_CARE_PROVIDER_SITE_OTHER): Payer: Medicare Other

## 2014-12-05 DIAGNOSIS — E039 Hypothyroidism, unspecified: Secondary | ICD-10-CM | POA: Diagnosis not present

## 2014-12-05 DIAGNOSIS — J309 Allergic rhinitis, unspecified: Secondary | ICD-10-CM

## 2014-12-11 ENCOUNTER — Ambulatory Visit (INDEPENDENT_AMBULATORY_CARE_PROVIDER_SITE_OTHER): Payer: Medicare Other | Admitting: Physician Assistant

## 2014-12-11 ENCOUNTER — Encounter: Payer: Self-pay | Admitting: Physician Assistant

## 2014-12-11 VITALS — BP 128/84 | HR 93 | Ht 70.5 in | Wt 242.4 lb

## 2014-12-11 DIAGNOSIS — Z9861 Coronary angioplasty status: Secondary | ICD-10-CM | POA: Diagnosis not present

## 2014-12-11 DIAGNOSIS — R600 Localized edema: Secondary | ICD-10-CM | POA: Diagnosis not present

## 2014-12-11 DIAGNOSIS — I2510 Atherosclerotic heart disease of native coronary artery without angina pectoris: Secondary | ICD-10-CM

## 2014-12-11 MED ORDER — FUROSEMIDE 20 MG PO TABS: 20.0000 mg | ORAL_TABLET | Freq: Every day | ORAL | Status: DC | PRN

## 2014-12-11 MED ORDER — POTASSIUM CHLORIDE ER 10 MEQ PO TBCR: EXTENDED_RELEASE_TABLET | ORAL | Status: DC

## 2014-12-11 NOTE — Patient Instructions (Signed)
Your physician has recommended you make the following change in your medication...  1. TAKE furosemide (lasxi) 20mg  daily until your weight is down 6lbs 2. Once your weight is down 6lbs, take lasix as needed 3. TAKE potassium 60mEq once daily when you take lasix  Your physician recommends that you schedule a follow-up appointment in: 2 weeks with Gaspar Bidding, PA or extender

## 2014-12-11 NOTE — Progress Notes (Signed)
Patient ID: [name], male   DOB: 12-30-1940, 74 y.o.   MRN: [id number]    Date:  12/11/2014   ID:  [name], DOB 1941/02/28, MRN [id number]  PCP:  Horatio Pel, MD  Primary Cardiologist:  Stanford Breed  Chief Complaint  Patient presents with  . Follow-up    per Dr. Stanford Breed - labs, no shortness of breath since changing from brilinta to plavix; concerns about gaining weight and swollen ankles     History of Present Illness: [name] is a 74 y.o. male who has had previous PCI of his LAD. His last echocardiogram in Oct 2009 showed an ejection fraction of 50-55% and trace aortic insufficiency. Myoview in December of 2012 showed an ejection fraction of 49%. There was scar with mild peri-infarct ischemia in the distal anteroseptal and apical wall. Abdominal ultrasound in January of 2014 showed no aneurysm. Patient admitted in April 2016 with chest pain. Cardiac catheterization revealed an ejection fraction of 55%. There is an 80% LAD followed by 40-50% lesion. The patient had PCI of his LAD with a drug-eluting stent.    The patient presents for a 1 month follow-up.  Since he stopped taking the Brilinta and started taking Plavix dyspnea has resolved. He worked out at [employer] this morning with no problems. He does report however, his weight has increased 6 pounds since his last office visit and he has a lot of lower extremity edema.  He denies orthopnea or PND.  The patient currently denies nausea, vomiting, fever, chest pain, shortness of breath, dizziness,  cough, congestion, abdominal pain, hematochezia, melena, claudication.  Wt Readings from Last 3 Encounters:  12/11/14 242 lb 6.4 oz (109.952 kg)  10/31/14 236 lb (107.049 kg)  09/20/14 237 lb 1.6 oz (107.548 kg)     Past Medical History  Diagnosis Date  . Hyperlipidemia   . Hiatal hernia   . GERD (gastroesophageal reflux disease)   . Hypothyroidism   . Allergic asthma   . COPD (chronic obstructive pulmonary  disease)   . CAD (coronary artery disease)   . BPH (benign prostatic hyperplasia)   . Psoriasis   . Barrett's esophagus     Current Outpatient Prescriptions  Medication Sig Dispense Refill  . albuterol (PROVENTIL HFA;VENTOLIN HFA) 108 (90 BASE) MCG/ACT inhaler Inhale 1-2 puffs into the lungs every 6 (six) hours as needed for wheezing or shortness of breath. 1 Inhaler 11  . Ascorbic Acid (VITAMIN C) 1000 MG tablet Take 1,000 mg by mouth daily.    Marland Kitchen aspirin EC 81 MG tablet Take 81 mg by mouth daily.     Marland Kitchen atorvastatin (LIPITOR) 80 MG tablet Take 1 tablet (80 mg total) by mouth daily. 90 tablet 4  . Cholecalciferol (VITAMIN D3) 1000 UNITS CAPS Take 1,000 Units by mouth daily.    . clopidogrel (PLAVIX) 75 MG tablet Take 1 tablet (75 mg total) by mouth daily. 90 tablet 3  . cycloSPORINE (RESTASIS) 0.05 % ophthalmic emulsion Place 1 drop into both eyes 2 (two) times daily.      . eszopiclone (LUNESTA) 2 MG TABS tablet Take 0.5 tablets by mouth as needed.    . fexofenadine (ALLEGRA) 180 MG tablet Take 180 mg by mouth daily.      . finasteride (PROSCAR) 5 MG tablet Take 5 mg by mouth daily.      . fluticasone (CUTIVATE) 0.05 % cream Apply 1 application topically every morning.  2  . Glucosamine-Chondroit-Vit C-Mn (GLUCOSAMINE 1500 COMPLEX PO) Take  2 capsules by mouth daily.    Marland Kitchen levothyroxine (SYNTHROID, LEVOTHROID) 125 MCG tablet Take 125 mcg by mouth daily before breakfast.    . metoprolol tartrate (LOPRESSOR) 25 MG tablet Take 1 tablet (25 mg total) by mouth 2 (two) times daily. 60 tablet 6  . Multiple Vitamins-Minerals (ICAPS PO) Take 2 capsules by mouth 2 (two) times daily.    . Multiple Vitamins-Minerals (SENIOR MULTIVITAMIN PLUS PO) Once a day     . nitroGLYCERIN (NITROLINGUAL) 0.4 MG/SPRAY spray Place 1 spray under the tongue every 5 (five) minutes x 3 doses as needed for chest pain. Use as directed as needed 12 g 12  . pantoprazole (PROTONIX) 40 MG tablet Take 30-60 min before first meal  of the day     No current facility-administered medications for this visit.    Allergies:   No Known Allergies  Social History:  The patient  reports that he quit smoking about 29 years ago. His smoking use included Pipe and Cigars. He does not have any smokeless tobacco history on file. He reports that he drinks about 1.2 oz of alcohol per week. He reports that he does not use illicit drugs.   Family history:   Family History  Problem Relation Age of Onset  . Lung cancer Father   . Dementia Mother   . COPD Mother     ROS:  Please see the history of present illness.  All other systems reviewed and negative.   PHYSICAL EXAM: VS:  BP 128/84 mmHg  Pulse 93  Ht 5' 10.5" (1.791 m)  Wt 242 lb 6.4 oz (109.952 kg)  BMI 34.28 kg/m2 obese, well developed, in no acute distress HEENT: Pupils are equal round react to light accommodation extraocular movements are intact.  Neck: no JVDNo cervical lymphadenopathy. Cardiac: Regular rate and rhythm without murmurs rubs or gallops. Lungs:  clear to auscultation bilaterally, no wheezing, rhonchi or rales Abd: soft, nontender, positive bowel sounds all quadrants. Ext: 2+ lower extremity edema.  2+ radial and dorsalis pedis pulses. Skin: warm and dry Neuro:  Grossly normal    ASSESSMENT AND PLAN:  Lower extremity edema, bilateral Start him on 20 mg of Lasix and 10 MG Q of potassium daily when necessary.   He will take until the lower extremity edema resolves and his weight returned normal.  Not sure what initiated lower extremity edema.  It does not seem, after talking to him, that he eats a lot of salt. He is not complaining of any other heart failure issues such as orthopnea or PND. Lungs are clear.  We'll see him back in 2 weeks for follow-up. Check a basic metabolic panel at that time.  Dyslipidemia Lipid Panel     Component Value Date/Time   CHOL 126 11/27/2014 0811   TRIG 92 11/27/2014 0811   HDL 42 11/27/2014 0811   CHOLHDL 3.0  11/27/2014 0811   VLDL 18 11/27/2014 0811   LDLCALC 66 11/27/2014 0811   Continue current statin.  CAD S/P PCI LAD 1987, `1997, 08/15/14 PCI pLAD DES Brilinta was changed to Plavix due to dyspnea at his last office visit.  Essential hypertension  Blood pressure well-controlled.

## 2014-12-12 ENCOUNTER — Ambulatory Visit (INDEPENDENT_AMBULATORY_CARE_PROVIDER_SITE_OTHER): Payer: Medicare Other

## 2014-12-12 DIAGNOSIS — J309 Allergic rhinitis, unspecified: Secondary | ICD-10-CM

## 2014-12-19 ENCOUNTER — Ambulatory Visit (INDEPENDENT_AMBULATORY_CARE_PROVIDER_SITE_OTHER): Payer: Medicare Other

## 2014-12-19 DIAGNOSIS — J309 Allergic rhinitis, unspecified: Secondary | ICD-10-CM

## 2014-12-20 ENCOUNTER — Encounter: Payer: Self-pay | Admitting: Internal Medicine

## 2014-12-26 ENCOUNTER — Ambulatory Visit (INDEPENDENT_AMBULATORY_CARE_PROVIDER_SITE_OTHER): Payer: Medicare Other

## 2014-12-26 DIAGNOSIS — J309 Allergic rhinitis, unspecified: Secondary | ICD-10-CM | POA: Diagnosis not present

## 2014-12-26 DIAGNOSIS — H2513 Age-related nuclear cataract, bilateral: Secondary | ICD-10-CM | POA: Diagnosis not present

## 2014-12-28 ENCOUNTER — Ambulatory Visit (INDEPENDENT_AMBULATORY_CARE_PROVIDER_SITE_OTHER): Payer: Medicare Other | Admitting: Physician Assistant

## 2014-12-28 ENCOUNTER — Encounter: Payer: Self-pay | Admitting: Physician Assistant

## 2014-12-28 VITALS — BP 120/80 | HR 70 | Ht 70.5 in | Wt 247.1 lb

## 2014-12-28 DIAGNOSIS — I200 Unstable angina: Secondary | ICD-10-CM

## 2014-12-28 DIAGNOSIS — I4510 Unspecified right bundle-branch block: Secondary | ICD-10-CM

## 2014-12-28 DIAGNOSIS — Z9861 Coronary angioplasty status: Secondary | ICD-10-CM

## 2014-12-28 DIAGNOSIS — K5900 Constipation, unspecified: Secondary | ICD-10-CM | POA: Insufficient documentation

## 2014-12-28 DIAGNOSIS — I10 Essential (primary) hypertension: Secondary | ICD-10-CM

## 2014-12-28 DIAGNOSIS — I2510 Atherosclerotic heart disease of native coronary artery without angina pectoris: Secondary | ICD-10-CM

## 2014-12-28 DIAGNOSIS — R600 Localized edema: Secondary | ICD-10-CM | POA: Insufficient documentation

## 2014-12-28 NOTE — Patient Instructions (Addendum)
Flax seed for constipation.   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

## 2014-12-28 NOTE — Progress Notes (Signed)
Patient ID: [name], male   DOB: Sep 17, 1940, 74 y.o.   MRN: [id number]     Date:  12/28/2014   ID:  [name], DOB Jan 15, 1941, MRN [id number]  PCP:  Horatio Pel, MD  Primary Cardiologist:  Stanford Breed   Chief Complaint  Patient presents with  . Follow-up     History of Present Illness: [name] is a 74 y.o. male who has had previous PCI of his LAD. His last echocardiogram in Oct 2009 showed an ejection fraction of 50-55% and trace aortic insufficiency. Myoview in December of 2012 showed an ejection fraction of 49%. There was scar with mild peri-infarct ischemia in the distal anteroseptal and apical wall. Abdominal ultrasound in January of 2014 showed no aneurysm. Patient admitted in April 2016 with chest pain. Cardiac catheterization revealed an ejection fraction of 55%. There was an 80% LAD followed by 40-50% lesion. The patient had PCI of his LAD with a drug-eluting stent.    I saw him on 12/11/2014  At that time he was having a significant amount of lower extremity edema. I started him on Lasix 20 mg daily along with 10 MG daily potassium. Edema has improved and he still has no orthopnea or PND. His weight however,has gone up another 5 pounds.  He has not had a significant bowel movement in about a week since he had a day of loose BMs.  Since he stopped taking the Brilinta and started taking Plavix dyspnea has resolved.   He also denies dizziness with position change.  The patient currently denies nausea, vomiting, fever, chest pain, shortness of breath, cough, congestion, abdominal pain, hematochezia, melena, claudication.  Wt Readings from Last 3 Encounters:  12/28/14 247 lb 1.6 oz (112.084 kg)  12/11/14 242 lb 6.4 oz (109.952 kg)  10/31/14 236 lb (107.049 kg)     Past Medical History  Diagnosis Date  . Hyperlipidemia   . Hiatal hernia   . GERD (gastroesophageal reflux disease)   . Hypothyroidism   . Allergic asthma   . COPD (chronic  obstructive pulmonary disease)   . CAD (coronary artery disease)   . BPH (benign prostatic hyperplasia)   . Psoriasis   . Barrett's esophagus     Current Outpatient Prescriptions  Medication Sig Dispense Refill  . albuterol (PROVENTIL HFA;VENTOLIN HFA) 108 (90 BASE) MCG/ACT inhaler Inhale 1-2 puffs into the lungs every 6 (six) hours as needed for wheezing or shortness of breath. 1 Inhaler 11  . Ascorbic Acid (VITAMIN C) 1000 MG tablet Take 1,000 mg by mouth daily.    Marland Kitchen aspirin EC 81 MG tablet Take 81 mg by mouth daily.     Marland Kitchen atorvastatin (LIPITOR) 80 MG tablet Take 1 tablet (80 mg total) by mouth daily. 90 tablet 4  . Cholecalciferol (VITAMIN D3) 1000 UNITS CAPS Take 1,000 Units by mouth daily.    . clopidogrel (PLAVIX) 75 MG tablet Take 1 tablet (75 mg total) by mouth daily. 90 tablet 3  . cycloSPORINE (RESTASIS) 0.05 % ophthalmic emulsion Place 1 drop into both eyes 2 (two) times daily.      . eszopiclone (LUNESTA) 2 MG TABS tablet Take 0.5 tablets by mouth as needed.    . fexofenadine (ALLEGRA) 180 MG tablet Take 180 mg by mouth daily.      . finasteride (PROSCAR) 5 MG tablet Take 5 mg by mouth daily.      . fluticasone (CUTIVATE) 0.05 % cream Apply 1 application topically every morning.  2  .  furosemide (LASIX) 20 MG tablet Take 1 tablet (20 mg total) by mouth daily as needed for fluid or edema. 30 tablet 3  . Glucosamine-Chondroit-Vit C-Mn (GLUCOSAMINE 1500 COMPLEX PO) Take 2 capsules by mouth daily.    Marland Kitchen KLOR-CON M10 10 MEQ tablet Take 1 tablet by mouth daily.  3  . levothyroxine (SYNTHROID, LEVOTHROID) 125 MCG tablet Take 125 mcg by mouth daily before breakfast.    . metoprolol tartrate (LOPRESSOR) 25 MG tablet Take 1 tablet (25 mg total) by mouth 2 (two) times daily. 60 tablet 6  . Multiple Vitamins-Minerals (ICAPS PO) Take 2 capsules by mouth 2 (two) times daily.    . Multiple Vitamins-Minerals (SENIOR MULTIVITAMIN PLUS PO) Once a day     . nitroGLYCERIN (NITROLINGUAL) 0.4  MG/SPRAY spray Place 1 spray under the tongue every 5 (five) minutes x 3 doses as needed for chest pain. Use as directed as needed 12 g 12  . NONFORMULARY OR COMPOUNDED ITEM Allergy Vaccine 1:10 Given at [facility]    . pantoprazole (PROTONIX) 40 MG tablet Take 30-60 min before first meal of the day    . potassium chloride (K-DUR) 10 MEQ tablet Take one tablet by mouth daily when taking lasix. 30 tablet 3   No current facility-administered medications for this visit.    Allergies:   No Known Allergies  Social History:  The patient  reports that he quit smoking about 29 years ago. His smoking use included Pipe and Cigars. He does not have any smokeless tobacco history on file. He reports that he drinks about 1.2 oz of alcohol per week. He reports that he does not use illicit drugs.   Family history:   Family History  Problem Relation Age of Onset  . Lung cancer Father   . Dementia Mother   . COPD Mother     ROS:  Please see the history of present illness.  All other systems reviewed and negative.   PHYSICAL EXAM: VS:  BP 120/80 mmHg  Pulse 70  Ht 5' 10.5" (1.791 m)  Wt 247 lb 1.6 oz (112.084 kg)  BMI 34.94 kg/m2 Well nourished, well developed, in no acute distress HEENT: Pupils are equal round react to light accommodation extraocular movements are intact.  Neck: no JVDNo cervical lymphadenopathy. Cardiac: Regular rate and rhythm without murmurs rubs or gallops. Lungs:  clear to auscultation bilaterally, no wheezing, rhonchi or rales Abd: soft, nontender, positive bowel sounds all quadrants, no hepatosplenomegaly Ext:  tracelower extremity edema.  2+ radial and dorsalis pedis pulses. Skin: warm and dry Neuro:  Grossly normal      ASSESSMENT AND PLAN:  Lower extremity edema, bilateral  I've asked him to reduce the Lasix to when necessary along with the potassium.  Not sure what initiated lower extremity edema. It does not seem, after talking to him, that he eats a  lot of salt.    I think his weight gain is related to not having a bowel movement in several days.   Lower extremity edema has clearly improvedCheck a basic metabolic panel  today.   constipation   recommended adding flaxseed to his diet. I think discontinuing the Lasix as can help as well   coronary artery disease  S/P PCI LAD 1987, `1997, 08/15/14 PCI pLAD DES Brilinta was changed to Plavix due to dyspnea at his last office visit.   Essential hypertension   A pressure well-controlled

## 2014-12-29 ENCOUNTER — Telehealth: Payer: Self-pay | Admitting: *Deleted

## 2014-12-29 LAB — BASIC METABOLIC PANEL WITH GFR
BUN: 10 mg/dL (ref 7–25)
CO2: 30 mmol/L (ref 20–31)
Calcium: 9.3 mg/dL (ref 8.6–10.3)
Chloride: 105 mmol/L (ref 98–110)
Creat: 0.83 mg/dL (ref 0.70–1.18)
Glucose, Bld: 93 mg/dL (ref 65–99)
Potassium: 4.6 mmol/L (ref 3.5–5.3)
Sodium: 143 mmol/L (ref 135–146)

## 2014-12-29 NOTE — Telephone Encounter (Signed)
-----   Message from Brett Canales, PA-C sent at 12/29/2014  1:26 PM EDT ----- Please let patient know labs are good.  Thanks, Gaspar Bidding

## 2014-12-29 NOTE — Telephone Encounter (Signed)
Spoke to patient. Result given . Verbalized understanding  

## 2015-01-02 ENCOUNTER — Ambulatory Visit (INDEPENDENT_AMBULATORY_CARE_PROVIDER_SITE_OTHER): Payer: Medicare Other

## 2015-01-02 DIAGNOSIS — J309 Allergic rhinitis, unspecified: Secondary | ICD-10-CM | POA: Diagnosis not present

## 2015-01-08 ENCOUNTER — Ambulatory Visit (INDEPENDENT_AMBULATORY_CARE_PROVIDER_SITE_OTHER): Payer: Medicare Other

## 2015-01-08 DIAGNOSIS — J309 Allergic rhinitis, unspecified: Secondary | ICD-10-CM | POA: Diagnosis not present

## 2015-01-09 ENCOUNTER — Ambulatory Visit (INDEPENDENT_AMBULATORY_CARE_PROVIDER_SITE_OTHER): Payer: Medicare Other

## 2015-01-09 ENCOUNTER — Telehealth: Payer: Self-pay | Admitting: Internal Medicine

## 2015-01-09 DIAGNOSIS — Z23 Encounter for immunization: Secondary | ICD-10-CM

## 2015-01-09 DIAGNOSIS — J309 Allergic rhinitis, unspecified: Secondary | ICD-10-CM | POA: Diagnosis not present

## 2015-01-09 NOTE — Telephone Encounter (Signed)
Date Mixed: 01/08/2015 Vial: AB Strength: 1:10 Here/Mail/Pick Up: Here Mixed By: Desmond Dike, CMA

## 2015-01-16 ENCOUNTER — Ambulatory Visit (INDEPENDENT_AMBULATORY_CARE_PROVIDER_SITE_OTHER): Payer: Medicare Other

## 2015-01-16 DIAGNOSIS — J309 Allergic rhinitis, unspecified: Secondary | ICD-10-CM

## 2015-01-23 ENCOUNTER — Ambulatory Visit: Payer: Medicare Other

## 2015-01-25 ENCOUNTER — Ambulatory Visit (INDEPENDENT_AMBULATORY_CARE_PROVIDER_SITE_OTHER): Payer: Medicare Other

## 2015-01-25 DIAGNOSIS — J309 Allergic rhinitis, unspecified: Secondary | ICD-10-CM | POA: Diagnosis not present

## 2015-01-30 ENCOUNTER — Ambulatory Visit (INDEPENDENT_AMBULATORY_CARE_PROVIDER_SITE_OTHER): Payer: Medicare Other

## 2015-01-30 DIAGNOSIS — J309 Allergic rhinitis, unspecified: Secondary | ICD-10-CM | POA: Diagnosis not present

## 2015-02-06 ENCOUNTER — Ambulatory Visit (INDEPENDENT_AMBULATORY_CARE_PROVIDER_SITE_OTHER): Payer: Medicare Other

## 2015-02-06 DIAGNOSIS — J309 Allergic rhinitis, unspecified: Secondary | ICD-10-CM

## 2015-02-13 ENCOUNTER — Ambulatory Visit (INDEPENDENT_AMBULATORY_CARE_PROVIDER_SITE_OTHER): Payer: Medicare Other

## 2015-02-13 DIAGNOSIS — J309 Allergic rhinitis, unspecified: Secondary | ICD-10-CM | POA: Diagnosis not present

## 2015-02-20 ENCOUNTER — Ambulatory Visit (INDEPENDENT_AMBULATORY_CARE_PROVIDER_SITE_OTHER): Payer: Medicare Other

## 2015-02-20 DIAGNOSIS — J309 Allergic rhinitis, unspecified: Secondary | ICD-10-CM

## 2015-02-27 ENCOUNTER — Ambulatory Visit (INDEPENDENT_AMBULATORY_CARE_PROVIDER_SITE_OTHER): Payer: Medicare Other

## 2015-02-27 DIAGNOSIS — J309 Allergic rhinitis, unspecified: Secondary | ICD-10-CM

## 2015-03-06 ENCOUNTER — Ambulatory Visit (INDEPENDENT_AMBULATORY_CARE_PROVIDER_SITE_OTHER): Payer: Medicare Other

## 2015-03-06 DIAGNOSIS — J309 Allergic rhinitis, unspecified: Secondary | ICD-10-CM | POA: Diagnosis not present

## 2015-03-12 ENCOUNTER — Other Ambulatory Visit: Payer: Self-pay

## 2015-03-12 ENCOUNTER — Other Ambulatory Visit: Payer: Self-pay | Admitting: Cardiology

## 2015-03-12 MED ORDER — FUROSEMIDE 20 MG PO TABS: 20.0000 mg | ORAL_TABLET | Freq: Every day | ORAL | Status: DC | PRN

## 2015-03-12 MED ORDER — KLOR-CON M10 10 MEQ PO TBCR: 10.0000 meq | EXTENDED_RELEASE_TABLET | Freq: Every day | ORAL | Status: DC

## 2015-03-13 ENCOUNTER — Ambulatory Visit (INDEPENDENT_AMBULATORY_CARE_PROVIDER_SITE_OTHER): Payer: Medicare Other

## 2015-03-13 DIAGNOSIS — J309 Allergic rhinitis, unspecified: Secondary | ICD-10-CM | POA: Diagnosis not present

## 2015-03-20 ENCOUNTER — Ambulatory Visit (INDEPENDENT_AMBULATORY_CARE_PROVIDER_SITE_OTHER): Payer: Medicare Other

## 2015-03-20 DIAGNOSIS — J309 Allergic rhinitis, unspecified: Secondary | ICD-10-CM | POA: Diagnosis not present

## 2015-03-21 NOTE — Progress Notes (Signed)
HPI: FU CAD. Patient has had previous PCI of his LAD. His last echocardiogram in Oct 2009 showed an ejection fraction of 50-55% and trace aortic insufficiency. Abdominal ultrasound in January of 2014 showed no aneurysm. Patient admitted in April 2016 with chest pain. Cardiac catheterization revealed an ejection fraction of 55%. There is an 80% LAD followed by 40-50% lesion. The patient had PCI of his LAD with a drug-eluting stent. Lasix started recently for lower ext edema. Since last seen, the patient denies any dyspnea on exertion, orthopnea, PND, pedal edema, palpitations, syncope or chest pain.   Current Outpatient Prescriptions  Medication Sig Dispense Refill  . albuterol (PROVENTIL HFA;VENTOLIN HFA) 108 (90 BASE) MCG/ACT inhaler Inhale 1-2 puffs into the lungs every 6 (six) hours as needed for wheezing or shortness of breath. 1 Inhaler 11  . Ascorbic Acid (VITAMIN C) 1000 MG tablet Take 1,000 mg by mouth daily.    Marland Kitchen aspirin EC 81 MG tablet Take 81 mg by mouth daily.     Marland Kitchen atorvastatin (LIPITOR) 80 MG tablet Take 1 tablet (80 mg total) by mouth daily. 90 tablet 4  . Cholecalciferol (VITAMIN D3) 1000 UNITS CAPS Take 1,000 Units by mouth daily.    . clopidogrel (PLAVIX) 75 MG tablet Take 1 tablet (75 mg total) by mouth daily. 90 tablet 3  . cycloSPORINE (RESTASIS) 0.05 % ophthalmic emulsion Place 1 drop into both eyes 2 (two) times daily.      . eszopiclone (LUNESTA) 2 MG TABS tablet Take 0.5 tablets by mouth as needed.    . fexofenadine (ALLEGRA) 180 MG tablet Take 180 mg by mouth daily.      . finasteride (PROSCAR) 5 MG tablet Take 5 mg by mouth daily.      . fluticasone (CUTIVATE) 0.05 % cream Apply 1 application topically every morning.  2  . furosemide (LASIX) 20 MG tablet Take 1 tablet (20 mg total) by mouth daily as needed for fluid or edema. 30 tablet 3  . Glucosamine-Chondroit-Vit C-Mn (GLUCOSAMINE 1500 COMPLEX PO) Take 2 capsules by mouth daily.    . hydrocortisone  (ANUSOL-HC) 2.5 % rectal cream Place 1 application rectally 2 (two) times daily.    Marland Kitchen KLOR-CON M10 10 MEQ tablet Take 1 tablet (10 mEq total) by mouth daily. 30 tablet 3  . levothyroxine (SYNTHROID, LEVOTHROID) 125 MCG tablet Take 125 mcg by mouth daily before breakfast.    . metoprolol tartrate (LOPRESSOR) 25 MG tablet TAKE 1 TABLET (25 MG TOTAL) BY MOUTH 2 (TWO) TIMES DAILY. 60 tablet 4  . Multiple Vitamins-Minerals (ICAPS PO) Take 2 capsules by mouth 2 (two) times daily.    . Multiple Vitamins-Minerals (SENIOR MULTIVITAMIN PLUS PO) Once a day     . nitroGLYCERIN (NITROLINGUAL) 0.4 MG/SPRAY spray Place 1 spray under the tongue every 5 (five) minutes x 3 doses as needed for chest pain. Use as directed as needed 12 g 12  . NONFORMULARY OR COMPOUNDED ITEM Allergy Vaccine 1:10 Given at [facility]    . pantoprazole (PROTONIX) 40 MG tablet Take 30-60 min before first meal of the day    . potassium chloride (K-DUR) 10 MEQ tablet Take one tablet by mouth daily when taking lasix. 30 tablet 3   No current facility-administered medications for this visit.     Past Medical History  Diagnosis Date  . Hyperlipidemia   . Hiatal hernia   . GERD (gastroesophageal reflux disease)   . Hypothyroidism   . Allergic asthma   .  COPD (chronic obstructive pulmonary disease) ([place])   . CAD (coronary artery disease)   . BPH (benign prostatic hyperplasia)   . Psoriasis   . Barrett's esophagus     Past Surgical History  Procedure Laterality Date  . Left knee arthroscopy    . Tonsillectomy    . Nasal sinus surgery    . Nasal polyp excision  1953  . Left heart catheterization with coronary angiogram N/A 08/15/2014    Procedure: LEFT HEART CATHETERIZATION WITH CORONARY ANGIOGRAM;  Surgeon: Peter M Martinique, MD;  Location: [facility] CATH LAB;  Service: Cardiovascular;  Laterality: N/A;  . Coronary angioplasty with stent placement  08/15/14    PCI of pLAD Xience alpine DES    Social History   Social History    . Marital Status: Married    Spouse Name: N/A  . Number of Children: N/A  . Years of Education: N/A   Occupational History  . Minister    Social History Main Topics  . Smoking status: Former Smoker -- 3.00 packs/day for 33 years    Types: Pipe, Cigars    Quit date: 04/28/1985  . Smokeless tobacco: Not on file  . Alcohol Use: 1.2 oz/week    1 Glasses of wine, 1 Shots of liquor per week  . Drug Use: No  . Sexual Activity: Not on file   Other Topics Concern  . Not on file   Social History Narrative    ROS: weight gain but no fevers or chills, productive cough, hemoptysis, dysphasia, odynophagia, melena, hematochezia, dysuria, hematuria, rash, seizure activity, orthopnea, PND, pedal edema, claudication. Remaining systems are negative.  Physical Exam: Well-developed obese in no acute distress.  Skin is warm and dry.  HEENT is normal.  Neck is supple.  Chest is clear to auscultation with normal expansion.  Cardiovascular exam is regular rate and rhythm.  Abdominal exam nontender or distended. No masses palpated. Extremities show trace edema. neuro grossly intact

## 2015-03-27 ENCOUNTER — Ambulatory Visit (INDEPENDENT_AMBULATORY_CARE_PROVIDER_SITE_OTHER): Payer: Medicare Other

## 2015-03-27 DIAGNOSIS — J309 Allergic rhinitis, unspecified: Secondary | ICD-10-CM

## 2015-03-30 ENCOUNTER — Ambulatory Visit (INDEPENDENT_AMBULATORY_CARE_PROVIDER_SITE_OTHER): Payer: Medicare Other | Admitting: Cardiology

## 2015-03-30 ENCOUNTER — Encounter: Payer: Self-pay | Admitting: Cardiology

## 2015-03-30 VITALS — BP 126/60 | HR 91 | Ht 70.5 in | Wt 249.6 lb

## 2015-03-30 DIAGNOSIS — E785 Hyperlipidemia, unspecified: Secondary | ICD-10-CM | POA: Diagnosis not present

## 2015-03-30 DIAGNOSIS — Z9861 Coronary angioplasty status: Secondary | ICD-10-CM

## 2015-03-30 DIAGNOSIS — I2510 Atherosclerotic heart disease of native coronary artery without angina pectoris: Secondary | ICD-10-CM

## 2015-03-30 DIAGNOSIS — I200 Unstable angina: Secondary | ICD-10-CM | POA: Diagnosis not present

## 2015-03-30 DIAGNOSIS — I10 Essential (primary) hypertension: Secondary | ICD-10-CM

## 2015-03-30 DIAGNOSIS — R635 Abnormal weight gain: Secondary | ICD-10-CM | POA: Diagnosis not present

## 2015-03-30 NOTE — Assessment & Plan Note (Addendum)
Continue aspirin, Plavix and statin. Discontinue Plavix when he returns in 6 months. 

## 2015-03-30 NOTE — Assessment & Plan Note (Signed)
Blood pressure controlled. Continue present medications. 

## 2015-03-30 NOTE — Patient Instructions (Signed)
Your physician wants you to follow-up in: 6 MONTHS WITH DR CRENSHAW You will receive a reminder letter in the mail two months in advance. If you don't receive a letter, please call our office to schedule the follow-up appointment.   If you need a refill on your cardiac medications before your next appointment, please call your pharmacy.  

## 2015-03-30 NOTE — Assessment & Plan Note (Signed)
Patient not particularly volume overloaded on examination. Continue present dose of Lasix. I discussed importance of diet and exercise.

## 2015-03-30 NOTE — Assessment & Plan Note (Signed)
Continue statin. 

## 2015-04-03 ENCOUNTER — Ambulatory Visit (INDEPENDENT_AMBULATORY_CARE_PROVIDER_SITE_OTHER): Payer: Medicare Other

## 2015-04-03 DIAGNOSIS — J309 Allergic rhinitis, unspecified: Secondary | ICD-10-CM

## 2015-04-10 ENCOUNTER — Ambulatory Visit (INDEPENDENT_AMBULATORY_CARE_PROVIDER_SITE_OTHER): Payer: Medicare Other

## 2015-04-10 DIAGNOSIS — J309 Allergic rhinitis, unspecified: Secondary | ICD-10-CM | POA: Diagnosis not present

## 2015-04-11 DIAGNOSIS — R05 Cough: Secondary | ICD-10-CM | POA: Diagnosis not present

## 2015-04-11 DIAGNOSIS — J069 Acute upper respiratory infection, unspecified: Secondary | ICD-10-CM | POA: Diagnosis not present

## 2015-04-17 ENCOUNTER — Ambulatory Visit (INDEPENDENT_AMBULATORY_CARE_PROVIDER_SITE_OTHER): Payer: Medicare Other

## 2015-04-17 DIAGNOSIS — J309 Allergic rhinitis, unspecified: Secondary | ICD-10-CM

## 2015-04-24 ENCOUNTER — Ambulatory Visit (INDEPENDENT_AMBULATORY_CARE_PROVIDER_SITE_OTHER): Payer: Medicare Other

## 2015-04-24 DIAGNOSIS — J309 Allergic rhinitis, unspecified: Secondary | ICD-10-CM | POA: Diagnosis not present

## 2015-05-01 ENCOUNTER — Ambulatory Visit (INDEPENDENT_AMBULATORY_CARE_PROVIDER_SITE_OTHER): Payer: Medicare Other

## 2015-05-01 DIAGNOSIS — J309 Allergic rhinitis, unspecified: Secondary | ICD-10-CM | POA: Diagnosis not present

## 2015-05-02 ENCOUNTER — Ambulatory Visit (INDEPENDENT_AMBULATORY_CARE_PROVIDER_SITE_OTHER): Payer: Medicare Other

## 2015-05-02 ENCOUNTER — Telehealth: Payer: Self-pay | Admitting: Internal Medicine

## 2015-05-02 DIAGNOSIS — J309 Allergic rhinitis, unspecified: Secondary | ICD-10-CM | POA: Diagnosis not present

## 2015-05-02 NOTE — Telephone Encounter (Signed)
Allergy Serum Extract Date Mixed: 05/02/2015 Vial: AB Strength: 1:10 Here/Mail/Pick Up: Here Mixed By: Desmond Dike, CMA Last OV: 06/05/2014 Pending OV: N/A

## 2015-05-08 ENCOUNTER — Ambulatory Visit (INDEPENDENT_AMBULATORY_CARE_PROVIDER_SITE_OTHER): Payer: Medicare Other

## 2015-05-08 ENCOUNTER — Telehealth: Payer: Self-pay | Admitting: Internal Medicine

## 2015-05-08 DIAGNOSIS — J309 Allergic rhinitis, unspecified: Secondary | ICD-10-CM | POA: Diagnosis not present

## 2015-05-08 NOTE — Telephone Encounter (Signed)
LMOMTCB x 1 

## 2015-05-09 NOTE — Telephone Encounter (Signed)
Spoke with pt. Complains of increased sneezing and sinus congestion. Has been taking Mucinex and Allegra with minimal relief. Denies coughing, PND or fever. States the onset of these symptoms was 3 months ago. Would like to know CY's recommendations.  No Known Allergies  Current Outpatient Prescriptions on File Prior to Visit  Medication Sig Dispense Refill  . albuterol (PROVENTIL HFA;VENTOLIN HFA) 108 (90 BASE) MCG/ACT inhaler Inhale 1-2 puffs into the lungs every 6 (six) hours as needed for wheezing or shortness of breath. 1 Inhaler 11  . Ascorbic Acid (VITAMIN C) 1000 MG tablet Take 1,000 mg by mouth daily.    Marland Kitchen aspirin EC 81 MG tablet Take 81 mg by mouth daily.     Marland Kitchen atorvastatin (LIPITOR) 80 MG tablet Take 1 tablet (80 mg total) by mouth daily. 90 tablet 4  . Cholecalciferol (VITAMIN D3) 1000 UNITS CAPS Take 1,000 Units by mouth daily.    . clopidogrel (PLAVIX) 75 MG tablet Take 1 tablet (75 mg total) by mouth daily. 90 tablet 3  . cycloSPORINE (RESTASIS) 0.05 % ophthalmic emulsion Place 1 drop into both eyes 2 (two) times daily.      . eszopiclone (LUNESTA) 2 MG TABS tablet Take 0.5 tablets by mouth as needed.    . fexofenadine (ALLEGRA) 180 MG tablet Take 180 mg by mouth daily.      . finasteride (PROSCAR) 5 MG tablet Take 5 mg by mouth daily.      . fluticasone (CUTIVATE) 0.05 % cream Apply 1 application topically every morning.  2  . furosemide (LASIX) 20 MG tablet Take 1 tablet (20 mg total) by mouth daily as needed for fluid or edema. 30 tablet 3  . Glucosamine-Chondroit-Vit C-Mn (GLUCOSAMINE 1500 COMPLEX PO) Take 2 capsules by mouth daily.    . hydrocortisone (ANUSOL-HC) 2.5 % rectal cream Place 1 application rectally 2 (two) times daily.    Marland Kitchen KLOR-CON M10 10 MEQ tablet Take 1 tablet (10 mEq total) by mouth daily. 30 tablet 3  . levothyroxine (SYNTHROID, LEVOTHROID) 125 MCG tablet Take 125 mcg by mouth daily before breakfast.    . metoprolol tartrate (LOPRESSOR) 25 MG tablet  TAKE 1 TABLET (25 MG TOTAL) BY MOUTH 2 (TWO) TIMES DAILY. 60 tablet 4  . Multiple Vitamins-Minerals (ICAPS PO) Take 2 capsules by mouth 2 (two) times daily.    . Multiple Vitamins-Minerals (SENIOR MULTIVITAMIN PLUS PO) Once a day     . nitroGLYCERIN (NITROLINGUAL) 0.4 MG/SPRAY spray Place 1 spray under the tongue every 5 (five) minutes x 3 doses as needed for chest pain. Use as directed as needed 12 g 12  . NONFORMULARY OR COMPOUNDED ITEM Allergy Vaccine 1:10 Given at [facility]    . pantoprazole (PROTONIX) 40 MG tablet Take 30-60 min before first meal of the day    . potassium chloride (K-DUR) 10 MEQ tablet Take one tablet by mouth daily when taking lasix. 30 tablet 3   No current facility-administered medications on file prior to visit.   CY - please advise. Thanks.

## 2015-05-09 NOTE — Telephone Encounter (Signed)
The problem this time of year may be over-drying onf nasal membranes.  Suggest- try leaving off the antihistamine Allegra                  Stay well hydrated                   Use otc nasal saline spray or rinse (like NeilMed type of Neti pot or squeeze bottle)

## 2015-05-09 NOTE — Telephone Encounter (Signed)
LM for pt x 1  

## 2015-05-10 NOTE — Telephone Encounter (Signed)
lmtcb X2 for pt.  

## 2015-05-11 NOTE — Telephone Encounter (Signed)
Pt is aware of CY's recommendations. Nothing further was needed. 

## 2015-05-14 ENCOUNTER — Other Ambulatory Visit (HOSPITAL_COMMUNITY): Payer: Self-pay | Admitting: Internal Medicine

## 2015-05-14 ENCOUNTER — Ambulatory Visit (HOSPITAL_COMMUNITY)
Admission: RE | Admit: 2015-05-14 | Discharge: 2015-05-14 | Disposition: A | Discharge status: Home / Self Care | Payer: Medicare Other | Source: Ambulatory Visit | Attending: Surgery | Admitting: Surgery

## 2015-05-14 DIAGNOSIS — B079 Viral wart, unspecified: Secondary | ICD-10-CM | POA: Diagnosis not present

## 2015-05-14 DIAGNOSIS — M7989 Other specified soft tissue disorders: Secondary | ICD-10-CM | POA: Diagnosis not present

## 2015-05-14 DIAGNOSIS — R600 Localized edema: Secondary | ICD-10-CM

## 2015-05-14 DIAGNOSIS — M79604 Pain in right leg: Secondary | ICD-10-CM | POA: Insufficient documentation

## 2015-05-14 DIAGNOSIS — R6882 Decreased libido: Secondary | ICD-10-CM | POA: Diagnosis not present

## 2015-05-14 DIAGNOSIS — R400 Somnolence: Secondary | ICD-10-CM | POA: Diagnosis not present

## 2015-05-15 ENCOUNTER — Ambulatory Visit (INDEPENDENT_AMBULATORY_CARE_PROVIDER_SITE_OTHER): Payer: Medicare Other

## 2015-05-15 DIAGNOSIS — J309 Allergic rhinitis, unspecified: Secondary | ICD-10-CM | POA: Diagnosis not present

## 2015-05-18 DIAGNOSIS — L03116 Cellulitis of left lower limb: Secondary | ICD-10-CM | POA: Diagnosis not present

## 2015-05-21 DIAGNOSIS — R6882 Decreased libido: Secondary | ICD-10-CM | POA: Diagnosis not present

## 2015-05-21 DIAGNOSIS — R600 Localized edema: Secondary | ICD-10-CM | POA: Diagnosis not present

## 2015-05-22 ENCOUNTER — Ambulatory Visit (INDEPENDENT_AMBULATORY_CARE_PROVIDER_SITE_OTHER): Payer: Medicare Other

## 2015-05-22 DIAGNOSIS — J309 Allergic rhinitis, unspecified: Secondary | ICD-10-CM | POA: Diagnosis not present

## 2015-05-22 DIAGNOSIS — E1165 Type 2 diabetes mellitus with hyperglycemia: Secondary | ICD-10-CM | POA: Diagnosis not present

## 2015-05-23 DIAGNOSIS — R400 Somnolence: Secondary | ICD-10-CM | POA: Diagnosis not present

## 2015-05-23 DIAGNOSIS — R234 Changes in skin texture: Secondary | ICD-10-CM | POA: Diagnosis not present

## 2015-05-23 DIAGNOSIS — R0683 Snoring: Secondary | ICD-10-CM | POA: Diagnosis not present

## 2015-05-23 DIAGNOSIS — R5383 Other fatigue: Secondary | ICD-10-CM | POA: Diagnosis not present

## 2015-05-29 ENCOUNTER — Ambulatory Visit (INDEPENDENT_AMBULATORY_CARE_PROVIDER_SITE_OTHER): Payer: Medicare Other

## 2015-05-29 DIAGNOSIS — J309 Allergic rhinitis, unspecified: Secondary | ICD-10-CM | POA: Diagnosis not present

## 2015-05-30 DIAGNOSIS — G4733 Obstructive sleep apnea (adult) (pediatric): Secondary | ICD-10-CM | POA: Diagnosis not present

## 2015-05-30 DIAGNOSIS — R0683 Snoring: Secondary | ICD-10-CM | POA: Diagnosis not present

## 2015-05-30 DIAGNOSIS — R5383 Other fatigue: Secondary | ICD-10-CM | POA: Diagnosis not present

## 2015-05-30 DIAGNOSIS — R400 Somnolence: Secondary | ICD-10-CM | POA: Diagnosis not present

## 2015-06-05 ENCOUNTER — Ambulatory Visit (INDEPENDENT_AMBULATORY_CARE_PROVIDER_SITE_OTHER): Payer: Medicare Other

## 2015-06-05 DIAGNOSIS — J309 Allergic rhinitis, unspecified: Secondary | ICD-10-CM

## 2015-06-05 DIAGNOSIS — L409 Psoriasis, unspecified: Secondary | ICD-10-CM | POA: Diagnosis not present

## 2015-06-05 DIAGNOSIS — G4733 Obstructive sleep apnea (adult) (pediatric): Secondary | ICD-10-CM | POA: Diagnosis not present

## 2015-06-05 DIAGNOSIS — R58 Hemorrhage, not elsewhere classified: Secondary | ICD-10-CM | POA: Diagnosis not present

## 2015-06-05 DIAGNOSIS — L304 Erythema intertrigo: Secondary | ICD-10-CM | POA: Diagnosis not present

## 2015-06-12 ENCOUNTER — Ambulatory Visit (INDEPENDENT_AMBULATORY_CARE_PROVIDER_SITE_OTHER): Payer: Medicare Other

## 2015-06-12 DIAGNOSIS — J309 Allergic rhinitis, unspecified: Secondary | ICD-10-CM

## 2015-06-18 DIAGNOSIS — R0683 Snoring: Secondary | ICD-10-CM | POA: Diagnosis not present

## 2015-06-18 DIAGNOSIS — R400 Somnolence: Secondary | ICD-10-CM | POA: Diagnosis not present

## 2015-06-18 DIAGNOSIS — G4733 Obstructive sleep apnea (adult) (pediatric): Secondary | ICD-10-CM | POA: Diagnosis not present

## 2015-06-18 DIAGNOSIS — R5383 Other fatigue: Secondary | ICD-10-CM | POA: Diagnosis not present

## 2015-06-19 ENCOUNTER — Ambulatory Visit (INDEPENDENT_AMBULATORY_CARE_PROVIDER_SITE_OTHER): Payer: Medicare Other

## 2015-06-19 DIAGNOSIS — J309 Allergic rhinitis, unspecified: Secondary | ICD-10-CM | POA: Diagnosis not present

## 2015-06-20 ENCOUNTER — Encounter: Payer: Self-pay | Admitting: Internal Medicine

## 2015-06-26 ENCOUNTER — Ambulatory Visit (INDEPENDENT_AMBULATORY_CARE_PROVIDER_SITE_OTHER): Payer: Medicare Other | Admitting: *Deleted

## 2015-06-26 DIAGNOSIS — J309 Allergic rhinitis, unspecified: Secondary | ICD-10-CM

## 2015-06-29 DIAGNOSIS — L409 Psoriasis, unspecified: Secondary | ICD-10-CM | POA: Diagnosis not present

## 2015-06-29 DIAGNOSIS — L304 Erythema intertrigo: Secondary | ICD-10-CM | POA: Diagnosis not present

## 2015-07-03 ENCOUNTER — Ambulatory Visit (INDEPENDENT_AMBULATORY_CARE_PROVIDER_SITE_OTHER): Payer: Medicare Other | Admitting: *Deleted

## 2015-07-03 DIAGNOSIS — J309 Allergic rhinitis, unspecified: Secondary | ICD-10-CM

## 2015-07-10 ENCOUNTER — Ambulatory Visit (INDEPENDENT_AMBULATORY_CARE_PROVIDER_SITE_OTHER): Payer: Medicare Other | Admitting: *Deleted

## 2015-07-10 ENCOUNTER — Telehealth: Payer: Self-pay | Admitting: Internal Medicine

## 2015-07-10 DIAGNOSIS — J309 Allergic rhinitis, unspecified: Secondary | ICD-10-CM | POA: Diagnosis not present

## 2015-07-10 NOTE — Telephone Encounter (Signed)
Called spoke with pt. He reports he last had a PNA vaccine about 5 years ago. He is requesting to come in for another PNA vaccine. Please advise Dr. Annamaria Boots thanks

## 2015-07-11 NOTE — Telephone Encounter (Signed)
If he hasn't had it before, he should have the Prevnar 13 pneumonia vaccine, which is a one-time shot

## 2015-07-11 NOTE — Telephone Encounter (Signed)
Spoke with pt. He is aware of CY's recommendation. Pt has been added to the allergy injection schedule to have this vaccine.

## 2015-07-12 DIAGNOSIS — L409 Psoriasis, unspecified: Secondary | ICD-10-CM | POA: Diagnosis not present

## 2015-07-12 DIAGNOSIS — L304 Erythema intertrigo: Secondary | ICD-10-CM | POA: Diagnosis not present

## 2015-07-16 DIAGNOSIS — J441 Chronic obstructive pulmonary disease with (acute) exacerbation: Secondary | ICD-10-CM | POA: Diagnosis not present

## 2015-07-17 ENCOUNTER — Ambulatory Visit: Payer: Medicare Other

## 2015-07-18 ENCOUNTER — Other Ambulatory Visit: Payer: Self-pay | Admitting: *Deleted

## 2015-07-18 MED ORDER — FUROSEMIDE 20 MG PO TABS: 20.0000 mg | ORAL_TABLET | Freq: Every day | ORAL | Status: DC | PRN

## 2015-07-18 MED ORDER — KLOR-CON M10 10 MEQ PO TBCR: 10.0000 meq | EXTENDED_RELEASE_TABLET | Freq: Every day | ORAL | Status: DC

## 2015-07-24 ENCOUNTER — Ambulatory Visit: Payer: Medicare Other

## 2015-08-29 ENCOUNTER — Other Ambulatory Visit: Payer: Self-pay | Admitting: Cardiology

## 2015-09-13 ENCOUNTER — Encounter: Payer: Self-pay | Admitting: Internal Medicine

## 2015-09-27 DIAGNOSIS — G4733 Obstructive sleep apnea (adult) (pediatric): Secondary | ICD-10-CM | POA: Diagnosis not present

## 2015-09-27 DIAGNOSIS — L409 Psoriasis, unspecified: Secondary | ICD-10-CM | POA: Diagnosis not present

## 2015-09-27 DIAGNOSIS — N529 Male erectile dysfunction, unspecified: Secondary | ICD-10-CM | POA: Diagnosis not present

## 2015-09-27 DIAGNOSIS — M7541 Impingement syndrome of right shoulder: Secondary | ICD-10-CM | POA: Diagnosis not present

## 2015-10-04 DIAGNOSIS — N401 Enlarged prostate with lower urinary tract symptoms: Secondary | ICD-10-CM | POA: Diagnosis not present

## 2015-10-04 DIAGNOSIS — T387X5A Adverse effect of androgens and anabolic congeners, initial encounter: Secondary | ICD-10-CM | POA: Diagnosis not present

## 2015-10-04 DIAGNOSIS — R358 Other polyuria: Secondary | ICD-10-CM | POA: Diagnosis not present

## 2015-10-04 DIAGNOSIS — N138 Other obstructive and reflux uropathy: Secondary | ICD-10-CM | POA: Diagnosis not present

## 2015-10-04 DIAGNOSIS — E291 Testicular hypofunction: Secondary | ICD-10-CM | POA: Diagnosis not present

## 2015-10-19 ENCOUNTER — Telehealth: Payer: Self-pay | Admitting: Cardiology

## 2015-10-19 DIAGNOSIS — L03115 Cellulitis of right lower limb: Secondary | ICD-10-CM | POA: Diagnosis not present

## 2015-10-19 DIAGNOSIS — R0609 Other forms of dyspnea: Secondary | ICD-10-CM | POA: Diagnosis not present

## 2015-10-19 DIAGNOSIS — R5383 Other fatigue: Secondary | ICD-10-CM | POA: Diagnosis not present

## 2015-10-19 NOTE — Telephone Encounter (Signed)
Left message for pt to call.

## 2015-10-19 NOTE — Telephone Encounter (Signed)
Needs to be seen prior to ordering studies; would have him see PA and then me as scheduled. Kirk Ruths

## 2015-10-19 NOTE — Telephone Encounter (Signed)
Spoke with pt, he has noticed a decrease in his energy level over the last several months. He has been worked up for sleep apnea and diabetes and everything is normal. He can only walk 1/2 mile without fatigue, in the past he was able to walk 2 to 3 miles without a problem. He was seen by dr Shelia Media today and he wanted the pt to be seen, they are concerned about blockage issues. Pt denies angina and reports SOB with walking to 1/2 mile, no SOB with daily activities. His bp today with dr Shelia Media was 120/80. No appointment available sooner with dr Stanford Breed, pt offered APP appointment but the pt refused. The pt is requesting a nuclear stress test prior to his appointment 11-05-15. Will forward for dr Stanford Breed review

## 2015-10-19 NOTE — Telephone Encounter (Signed)
Pt having  fatigue wants sooner appt than 11-05-15- denies chest pain-pressure -SOB-lightheaded-dizziness-nausea -vomiting-pls advise [phone]

## 2015-10-22 NOTE — Telephone Encounter (Signed)
Left message for pt to call.

## 2015-10-25 DIAGNOSIS — N138 Other obstructive and reflux uropathy: Secondary | ICD-10-CM | POA: Diagnosis not present

## 2015-10-25 DIAGNOSIS — R339 Retention of urine, unspecified: Secondary | ICD-10-CM | POA: Diagnosis not present

## 2015-10-25 DIAGNOSIS — N401 Enlarged prostate with lower urinary tract symptoms: Secondary | ICD-10-CM | POA: Diagnosis not present

## 2015-10-26 DIAGNOSIS — E291 Testicular hypofunction: Secondary | ICD-10-CM | POA: Diagnosis not present

## 2015-10-31 NOTE — Progress Notes (Signed)
HPI: FU CAD. Patient has had previous PCI of his LAD. His last echocardiogram in Oct 2009 showed an ejection fraction of 50-55% and trace aortic insufficiency. Abdominal ultrasound in January of 2014 showed no aneurysm. Patient admitted in April 2016 with chest pain. Cardiac catheterization revealed an ejection fraction of 55%. There is an 80% LAD followed by 40-50% lesion. The patient had PCI of his LAD with a drug-eluting stent. Since last seen, He complains of fatigue. He denies dyspnea, chest pain, palpitations or syncope. Mild pedal edema.  Current Outpatient Prescriptions  Medication Sig Dispense Refill  . albuterol (PROVENTIL HFA;VENTOLIN HFA) 108 (90 BASE) MCG/ACT inhaler Inhale 1-2 puffs into the lungs every 6 (six) hours as needed for wheezing or shortness of breath. 1 Inhaler 11  . Ascorbic Acid (VITAMIN C) 1000 MG tablet Take 1,000 mg by mouth daily.    Marland Kitchen aspirin EC 81 MG tablet Take 81 mg by mouth daily.     Marland Kitchen atorvastatin (LIPITOR) 80 MG tablet Take 1 tablet (80 mg total) by mouth daily. 90 tablet 4  . Cholecalciferol (VITAMIN D3) 1000 UNITS CAPS Take 1,000 Units by mouth daily.    . clopidogrel (PLAVIX) 75 MG tablet Take 1 tablet (75 mg total) by mouth daily. 90 tablet 3  . cycloSPORINE (RESTASIS) 0.05 % ophthalmic emulsion Place 1 drop into both eyes 2 (two) times daily.      . eszopiclone (LUNESTA) 2 MG TABS tablet Take 0.5 tablets by mouth as needed.    . fexofenadine (ALLEGRA) 180 MG tablet Take 180 mg by mouth daily.      . fluticasone (CUTIVATE) 0.05 % cream Apply 1 application topically every morning.  2  . furosemide (LASIX) 20 MG tablet Take 1 tablet (20 mg total) by mouth daily as needed for fluid or edema. 30 tablet 3  . Glucosamine-Chondroit-Vit C-Mn (GLUCOSAMINE 1500 COMPLEX PO) Take 2 capsules by mouth daily.    . hydrocortisone (ANUSOL-HC) 2.5 % rectal cream Place 1 application rectally 2 (two) times daily.    Marland Kitchen KLOR-CON M10 10 MEQ tablet Take 1 tablet (10 mEq  total) by mouth daily. 30 tablet 3  . levothyroxine (SYNTHROID, LEVOTHROID) 125 MCG tablet Take 125 mcg by mouth daily before breakfast.    . metoprolol tartrate (LOPRESSOR) 25 MG tablet TAKE 1 TABLET (25 MG TOTAL) BY MOUTH 2 (TWO) TIMES DAILY. 60 tablet 1  . Multiple Vitamins-Minerals (ICAPS PO) Take 2 capsules by mouth 2 (two) times daily.    . Multiple Vitamins-Minerals (SENIOR MULTIVITAMIN PLUS PO) Once a day     . nitroGLYCERIN (NITROLINGUAL) 0.4 MG/SPRAY spray Place 1 spray under the tongue every 5 (five) minutes x 3 doses as needed for chest pain. Use as directed as needed 12 g 12  . NONFORMULARY OR COMPOUNDED ITEM Allergy Vaccine 1:10 Given at [facility]    . pantoprazole (PROTONIX) 40 MG tablet Take 30-60 min before first meal of the day    . potassium chloride (K-DUR) 10 MEQ tablet Take one tablet by mouth daily when taking lasix. 30 tablet 3   No current facility-administered medications for this visit.     Past Medical History  Diagnosis Date  . Hyperlipidemia   . Hiatal hernia   . GERD (gastroesophageal reflux disease)   . Hypothyroidism   . Allergic asthma   . COPD (chronic obstructive pulmonary disease) ([place])   . CAD (coronary artery disease)   . BPH (benign prostatic hyperplasia)   . Psoriasis   .  Barrett's esophagus     Past Surgical History  Procedure Laterality Date  . Left knee arthroscopy    . Tonsillectomy    . Nasal sinus surgery    . Nasal polyp excision  1953  . Left heart catheterization with coronary angiogram N/A 08/15/2014    Procedure: LEFT HEART CATHETERIZATION WITH CORONARY ANGIOGRAM;  Surgeon: Peter M Martinique, MD;  Location: [facility] CATH LAB;  Service: Cardiovascular;  Laterality: N/A;  . Coronary angioplasty with stent placement  08/15/14    PCI of pLAD Xience alpine DES    Social History   Social History  . Marital Status: Married    Spouse Name: N/A  . Number of Children: N/A  . Years of Education: N/A   Occupational History  .  Minister    Social History Main Topics  . Smoking status: Former Smoker -- 3.00 packs/day for 33 years    Types: Pipe, Cigars    Quit date: 04/28/1985  . Smokeless tobacco: Not on file  . Alcohol Use: 1.2 oz/week    1 Glasses of wine, 1 Shots of liquor per week  . Drug Use: No  . Sexual Activity: Not on file   Other Topics Concern  . Not on file   Social History Narrative    Family History  Problem Relation Age of Onset  . Lung cancer Father   . Dementia Mother   . COPD Mother     ROS: Arthralgias but no fevers or chills, productive cough, hemoptysis, dysphasia, odynophagia, melena, hematochezia, dysuria, hematuria, rash, seizure activity, orthopnea, PND, claudication. Remaining systems are negative.  Physical Exam: Well-developed obese in no acute distress.  Skin is warm and dry.  HEENT is normal.  Neck is supple.  Chest is clear to auscultation with normal expansion.  Cardiovascular exam is regular rate and rhythm.  Abdominal exam nontender or distended. No masses palpated. Extremities show 1 + edema. neuro grossly intact  ECG Sinus rhythm at a rate of 76. Right bundle branch block. No ST changes.  Assessment and plan  1 Coronary artery disease-continue aspirin and statin. Discontinue Plavix. 2 hyperlipidemia-continue statin. 3 hypertension-patient is complaining of fatigue. I wonder if his beta blocker is contributing. We will decrease to 12.5 mg twice a day for 3 days and then discontinue to see if his symptoms improved. He is to have blood work drawn in the near future by his primary care which would include hemoglobin and TSH. 4 edema-continue present dose of Lasix. Check echocardiogram.  Kirk Ruths, MD

## 2015-11-01 NOTE — Telephone Encounter (Signed)
Pt has an appt 11-05-15 with dr Stanford Breed

## 2015-11-05 ENCOUNTER — Ambulatory Visit (INDEPENDENT_AMBULATORY_CARE_PROVIDER_SITE_OTHER): Payer: Medicare Other | Admitting: Cardiology

## 2015-11-05 ENCOUNTER — Encounter: Payer: Self-pay | Admitting: Cardiology

## 2015-11-05 VITALS — BP 124/72 | HR 76 | Ht 70.0 in | Wt 260.0 lb

## 2015-11-05 DIAGNOSIS — Z9861 Coronary angioplasty status: Secondary | ICD-10-CM

## 2015-11-05 DIAGNOSIS — I10 Essential (primary) hypertension: Secondary | ICD-10-CM | POA: Diagnosis not present

## 2015-11-05 DIAGNOSIS — I2510 Atherosclerotic heart disease of native coronary artery without angina pectoris: Secondary | ICD-10-CM

## 2015-11-05 DIAGNOSIS — E785 Hyperlipidemia, unspecified: Secondary | ICD-10-CM | POA: Diagnosis not present

## 2015-11-05 NOTE — Patient Instructions (Signed)
Medications  STOP Plavix  Decrease Metoprolol to 12.5 mg for 3 days, then STOP.    Procedures  Your physician has requested that you have an echocardiogram. Echocardiography is a painless test that uses sound waves to create images of your heart. It provides your doctor with information about the size and shape of your heart and how well your heart's chambers and valves are working. This procedure takes approximately one hour. There are no restrictions for this procedure.   Follow-up in 6 months with Dr. Stanford Breed.  If you need a refill on your cardiac medications before your next appointment, please call your pharmacy.

## 2015-11-07 DIAGNOSIS — Z0000 Encounter for general adult medical examination without abnormal findings: Secondary | ICD-10-CM | POA: Diagnosis not present

## 2015-11-07 DIAGNOSIS — E7800 Pure hypercholesterolemia, unspecified: Secondary | ICD-10-CM | POA: Diagnosis not present

## 2015-11-07 DIAGNOSIS — M7541 Impingement syndrome of right shoulder: Secondary | ICD-10-CM | POA: Diagnosis not present

## 2015-11-07 DIAGNOSIS — E039 Hypothyroidism, unspecified: Secondary | ICD-10-CM | POA: Diagnosis not present

## 2015-11-07 DIAGNOSIS — I10 Essential (primary) hypertension: Secondary | ICD-10-CM | POA: Diagnosis not present

## 2015-11-07 DIAGNOSIS — Z125 Encounter for screening for malignant neoplasm of prostate: Secondary | ICD-10-CM | POA: Diagnosis not present

## 2015-11-12 ENCOUNTER — Ambulatory Visit
Admission: RE | Admit: 2015-11-12 | Discharge: 2015-11-12 | Disposition: A | Discharge status: Home / Self Care | Payer: Medicare Other | Source: Ambulatory Visit | Attending: Internal Medicine | Admitting: Internal Medicine

## 2015-11-12 ENCOUNTER — Other Ambulatory Visit: Payer: Self-pay | Admitting: Internal Medicine

## 2015-11-12 DIAGNOSIS — R609 Edema, unspecified: Secondary | ICD-10-CM

## 2015-11-12 DIAGNOSIS — M5136 Other intervertebral disc degeneration, lumbar region: Secondary | ICD-10-CM | POA: Diagnosis not present

## 2015-11-12 DIAGNOSIS — M545 Low back pain: Secondary | ICD-10-CM | POA: Diagnosis not present

## 2015-11-12 DIAGNOSIS — M5134 Other intervertebral disc degeneration, thoracic region: Secondary | ICD-10-CM | POA: Diagnosis not present

## 2015-11-12 DIAGNOSIS — M7989 Other specified soft tissue disorders: Secondary | ICD-10-CM | POA: Diagnosis not present

## 2015-11-14 ENCOUNTER — Other Ambulatory Visit: Payer: Self-pay | Admitting: Cardiology

## 2015-11-14 NOTE — Telephone Encounter (Signed)
Rx request sent to pharmacy.  

## 2015-11-15 DIAGNOSIS — R609 Edema, unspecified: Secondary | ICD-10-CM | POA: Diagnosis not present

## 2015-11-15 DIAGNOSIS — H6123 Impacted cerumen, bilateral: Secondary | ICD-10-CM | POA: Diagnosis not present

## 2015-11-15 DIAGNOSIS — M545 Low back pain: Secondary | ICD-10-CM | POA: Diagnosis not present

## 2015-11-15 DIAGNOSIS — I10 Essential (primary) hypertension: Secondary | ICD-10-CM | POA: Diagnosis not present

## 2015-11-16 ENCOUNTER — Other Ambulatory Visit: Payer: Self-pay | Admitting: Internal Medicine

## 2015-11-16 DIAGNOSIS — M545 Low back pain, unspecified: Secondary | ICD-10-CM

## 2015-11-19 ENCOUNTER — Ambulatory Visit
Admission: RE | Admit: 2015-11-19 | Discharge: 2015-11-19 | Disposition: A | Discharge status: Home / Self Care | Payer: Medicare Other | Source: Ambulatory Visit | Attending: Internal Medicine | Admitting: Internal Medicine

## 2015-11-19 DIAGNOSIS — M545 Low back pain, unspecified: Secondary | ICD-10-CM

## 2015-11-19 DIAGNOSIS — M5134 Other intervertebral disc degeneration, thoracic region: Secondary | ICD-10-CM | POA: Diagnosis not present

## 2015-11-20 DIAGNOSIS — M1990 Unspecified osteoarthritis, unspecified site: Secondary | ICD-10-CM | POA: Diagnosis not present

## 2015-11-20 DIAGNOSIS — M25511 Pain in right shoulder: Secondary | ICD-10-CM | POA: Diagnosis not present

## 2015-11-20 DIAGNOSIS — M549 Dorsalgia, unspecified: Secondary | ICD-10-CM | POA: Diagnosis not present

## 2015-11-21 ENCOUNTER — Other Ambulatory Visit (HOSPITAL_COMMUNITY): Payer: Self-pay

## 2015-11-21 ENCOUNTER — Ambulatory Visit (HOSPITAL_COMMUNITY): Payer: Medicare Other | Attending: Cardiovascular Disease

## 2015-11-21 DIAGNOSIS — I059 Rheumatic mitral valve disease, unspecified: Secondary | ICD-10-CM | POA: Insufficient documentation

## 2015-11-21 DIAGNOSIS — J449 Chronic obstructive pulmonary disease, unspecified: Secondary | ICD-10-CM | POA: Diagnosis not present

## 2015-11-21 DIAGNOSIS — Z9861 Coronary angioplasty status: Secondary | ICD-10-CM | POA: Diagnosis not present

## 2015-11-21 DIAGNOSIS — I517 Cardiomegaly: Secondary | ICD-10-CM | POA: Insufficient documentation

## 2015-11-21 DIAGNOSIS — I351 Nonrheumatic aortic (valve) insufficiency: Secondary | ICD-10-CM | POA: Diagnosis not present

## 2015-11-21 DIAGNOSIS — I2510 Atherosclerotic heart disease of native coronary artery without angina pectoris: Secondary | ICD-10-CM | POA: Insufficient documentation

## 2015-11-22 ENCOUNTER — Telehealth: Payer: Self-pay | Admitting: Cardiology

## 2015-11-22 DIAGNOSIS — M545 Low back pain: Secondary | ICD-10-CM | POA: Diagnosis not present

## 2015-11-22 NOTE — Telephone Encounter (Signed)
Returned call to patient. He had received a call from his pharmacy that his refill for metoprolol was ready. He asked was he supposed to begin taking it again? From his last office visit with Dr Stanford Breed on 11/05/15, patient was to decrease metoprolol to 12.5 mg for 3 days then STOP. This is what the patient did. It looks as though refill was submitted electronically by mistake after that visit. Metoprolol discontinued from pt's med list during this encounter and pt verbalized understanding to NOT take metoprolol anymore.

## 2015-11-22 NOTE — Telephone Encounter (Signed)
New message     Pt stated that he thought he was no longer suppose to take metoprolol 25 mg at the last visit. Please call.

## 2015-11-26 ENCOUNTER — Other Ambulatory Visit: Payer: Self-pay | Admitting: Cardiology

## 2015-11-26 NOTE — Telephone Encounter (Signed)
Potassium refilled already 11/14/15

## 2015-11-28 DIAGNOSIS — M5136 Other intervertebral disc degeneration, lumbar region: Secondary | ICD-10-CM | POA: Diagnosis not present

## 2015-11-29 DIAGNOSIS — H2513 Age-related nuclear cataract, bilateral: Secondary | ICD-10-CM | POA: Diagnosis not present

## 2015-12-03 DIAGNOSIS — M545 Low back pain: Secondary | ICD-10-CM | POA: Diagnosis not present

## 2015-12-10 ENCOUNTER — Other Ambulatory Visit: Payer: Self-pay | Admitting: Cardiology

## 2015-12-10 DIAGNOSIS — M545 Low back pain: Secondary | ICD-10-CM | POA: Diagnosis not present

## 2015-12-10 DIAGNOSIS — E785 Hyperlipidemia, unspecified: Secondary | ICD-10-CM

## 2015-12-10 NOTE — Telephone Encounter (Signed)
Rx(s) sent to pharmacy electronically.  

## 2015-12-26 DIAGNOSIS — M545 Low back pain: Secondary | ICD-10-CM | POA: Diagnosis not present

## 2015-12-27 ENCOUNTER — Other Ambulatory Visit: Payer: Self-pay

## 2015-12-28 DIAGNOSIS — M545 Low back pain: Secondary | ICD-10-CM | POA: Diagnosis not present

## 2016-01-02 DIAGNOSIS — M545 Low back pain: Secondary | ICD-10-CM | POA: Diagnosis not present

## 2016-01-04 DIAGNOSIS — M545 Low back pain: Secondary | ICD-10-CM | POA: Diagnosis not present

## 2016-01-09 DIAGNOSIS — M545 Low back pain: Secondary | ICD-10-CM | POA: Diagnosis not present

## 2016-01-11 DIAGNOSIS — M545 Low back pain: Secondary | ICD-10-CM | POA: Diagnosis not present

## 2016-01-18 DIAGNOSIS — M545 Low back pain: Secondary | ICD-10-CM | POA: Diagnosis not present

## 2016-01-21 DIAGNOSIS — M545 Low back pain: Secondary | ICD-10-CM | POA: Diagnosis not present

## 2016-01-23 DIAGNOSIS — M545 Low back pain: Secondary | ICD-10-CM | POA: Diagnosis not present

## 2016-01-24 DIAGNOSIS — N401 Enlarged prostate with lower urinary tract symptoms: Secondary | ICD-10-CM | POA: Diagnosis not present

## 2016-01-24 DIAGNOSIS — N138 Other obstructive and reflux uropathy: Secondary | ICD-10-CM | POA: Diagnosis not present

## 2016-01-25 DIAGNOSIS — M545 Low back pain: Secondary | ICD-10-CM | POA: Diagnosis not present

## 2016-02-28 DIAGNOSIS — L918 Other hypertrophic disorders of the skin: Secondary | ICD-10-CM | POA: Diagnosis not present

## 2016-02-28 DIAGNOSIS — L409 Psoriasis, unspecified: Secondary | ICD-10-CM | POA: Diagnosis not present

## 2016-02-28 DIAGNOSIS — Z23 Encounter for immunization: Secondary | ICD-10-CM | POA: Diagnosis not present

## 2016-03-03 DIAGNOSIS — D485 Neoplasm of uncertain behavior of skin: Secondary | ICD-10-CM | POA: Diagnosis not present

## 2016-03-03 DIAGNOSIS — L400 Psoriasis vulgaris: Secondary | ICD-10-CM | POA: Diagnosis not present

## 2016-03-04 DIAGNOSIS — B078 Other viral warts: Secondary | ICD-10-CM | POA: Diagnosis not present

## 2016-03-27 ENCOUNTER — Other Ambulatory Visit: Payer: Self-pay | Admitting: Dermatology

## 2016-03-27 DIAGNOSIS — L400 Psoriasis vulgaris: Secondary | ICD-10-CM | POA: Diagnosis not present

## 2016-03-27 DIAGNOSIS — B079 Viral wart, unspecified: Secondary | ICD-10-CM | POA: Diagnosis not present

## 2016-03-27 DIAGNOSIS — D485 Neoplasm of uncertain behavior of skin: Secondary | ICD-10-CM | POA: Diagnosis not present

## 2016-04-05 IMAGING — CR DG CHEST 2V
2 series · 2 of 2 positions shown · non-contrast
Comparison: 01/06/2013

CLINICAL DATA: Chest pain

EXAM:
CHEST  2 VIEW

[chest pa]
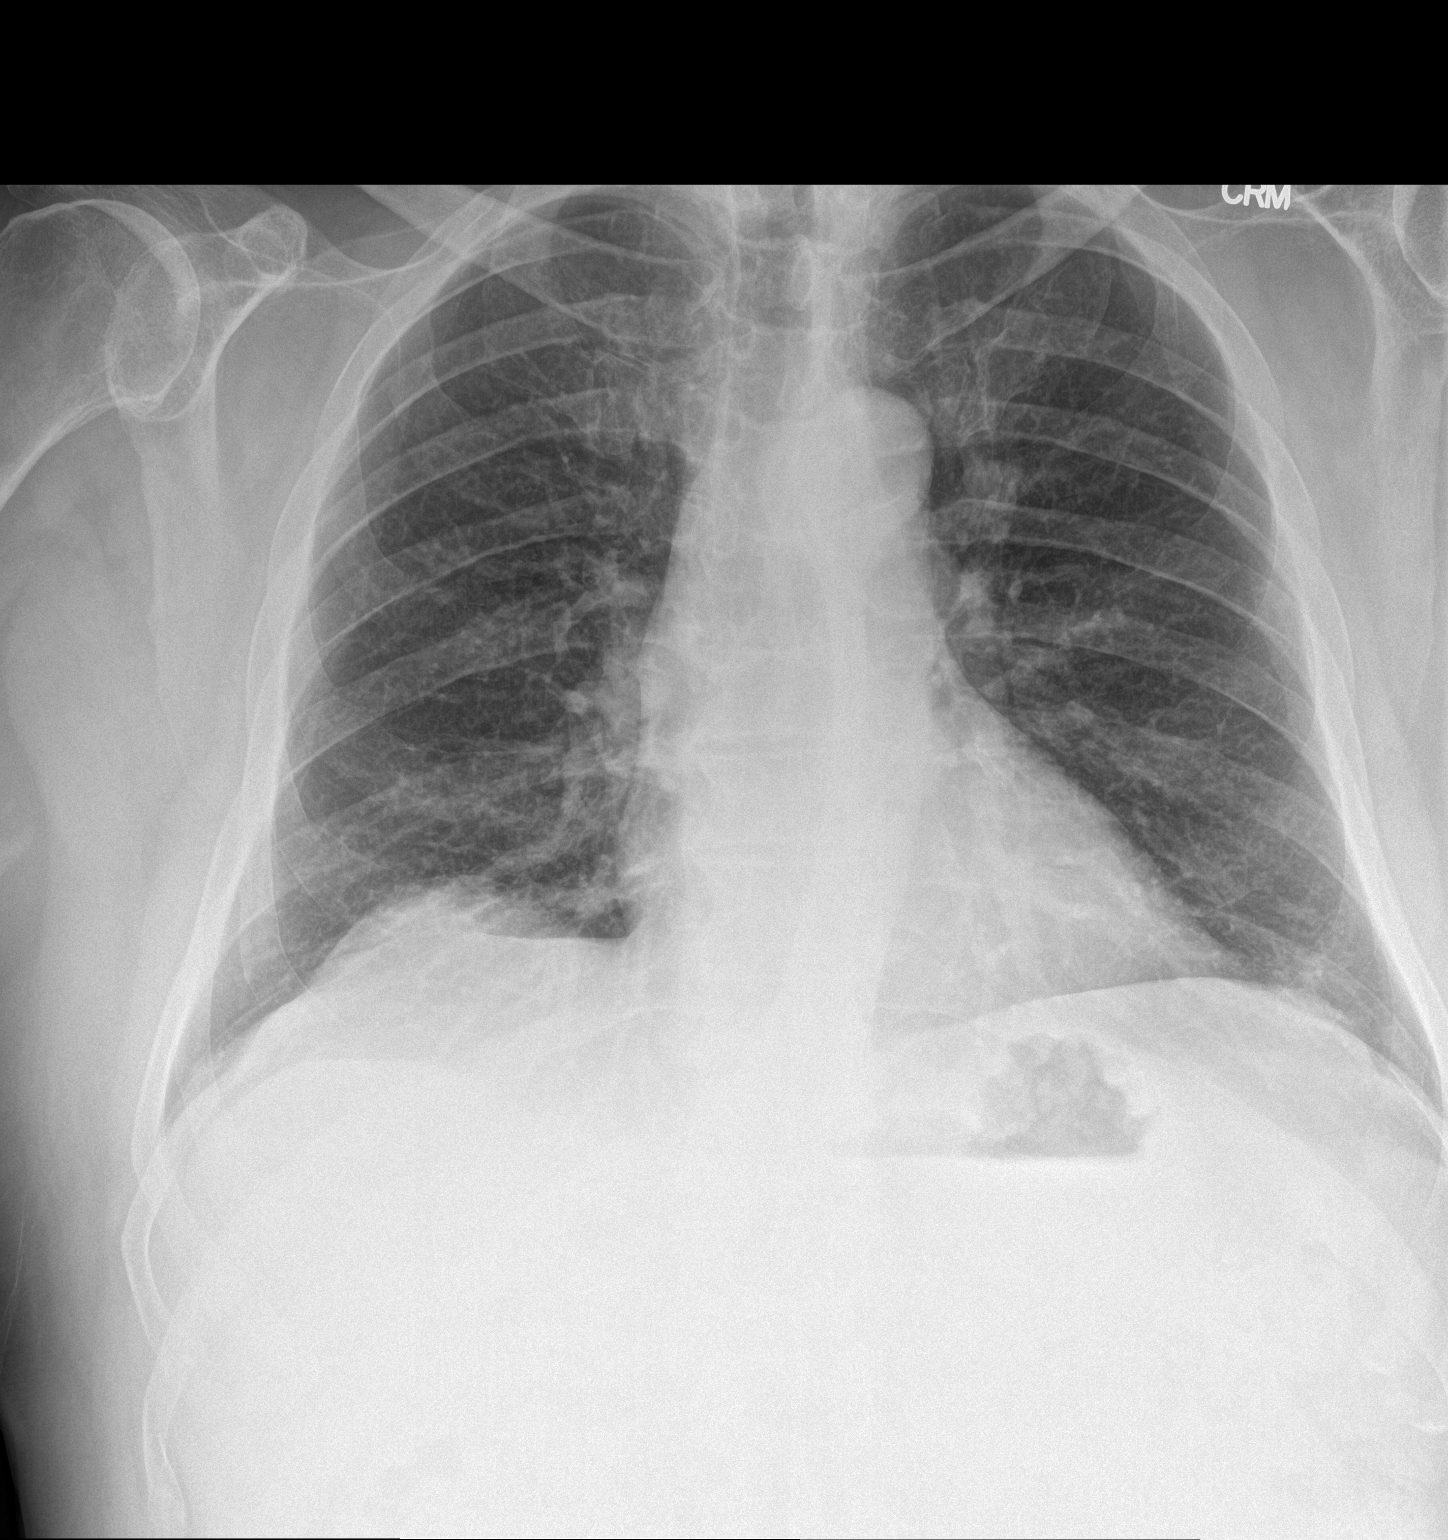

[chest lat]
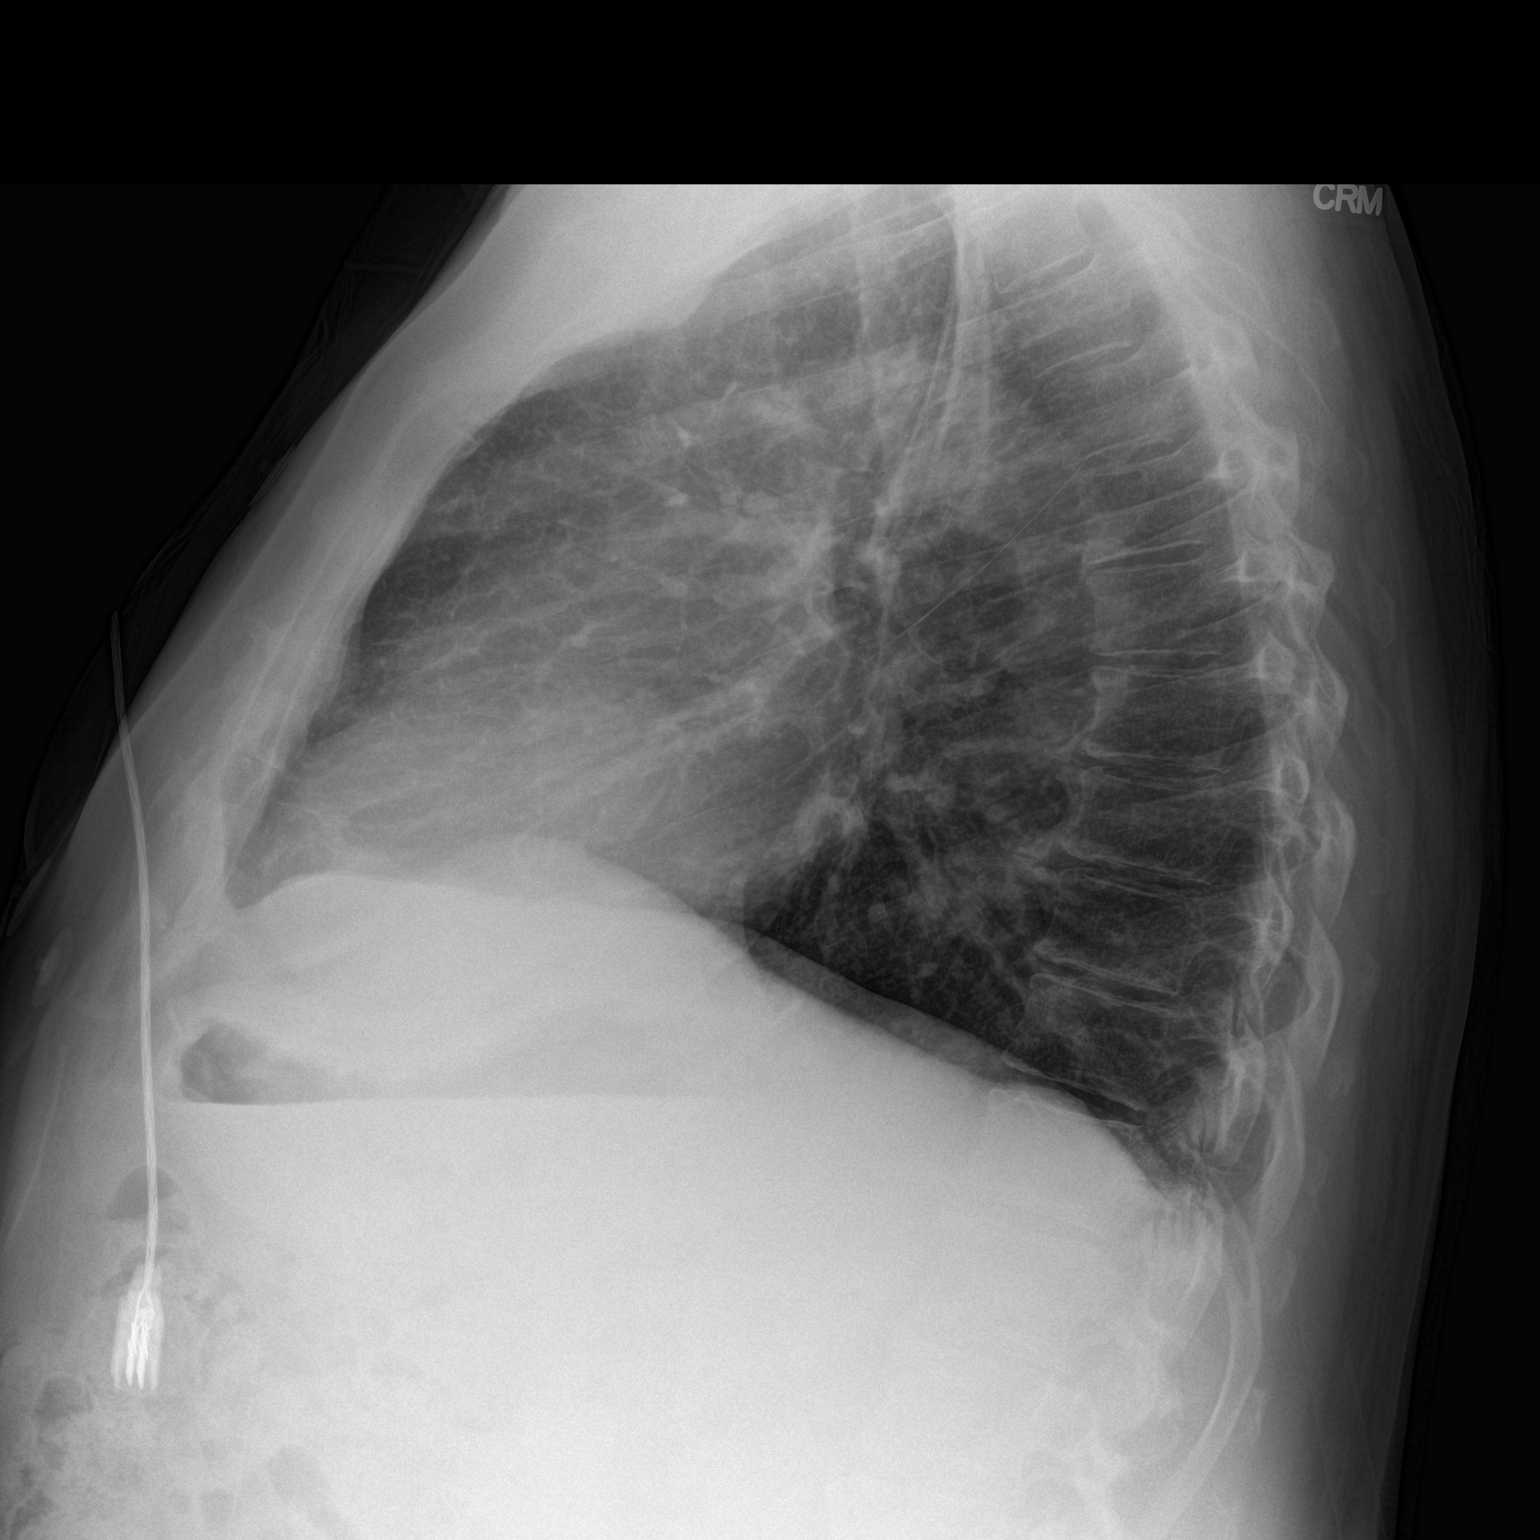

[2 of 2 positions shown; findings below may reference images not displayed]

FINDINGS: The heart size and mediastinal contours are within normal limits.
Both lungs are clear. The visualized skeletal structures are
unremarkable.
IMPRESSION: No active cardiopulmonary disease.

## 2016-04-29 DIAGNOSIS — M25511 Pain in right shoulder: Secondary | ICD-10-CM | POA: Diagnosis not present

## 2016-04-29 DIAGNOSIS — M5431 Sciatica, right side: Secondary | ICD-10-CM | POA: Diagnosis not present

## 2016-05-01 DIAGNOSIS — N401 Enlarged prostate with lower urinary tract symptoms: Secondary | ICD-10-CM | POA: Diagnosis not present

## 2016-05-01 DIAGNOSIS — N138 Other obstructive and reflux uropathy: Secondary | ICD-10-CM | POA: Diagnosis not present

## 2016-05-02 DIAGNOSIS — L400 Psoriasis vulgaris: Secondary | ICD-10-CM | POA: Diagnosis not present

## 2016-05-05 DIAGNOSIS — M25511 Pain in right shoulder: Secondary | ICD-10-CM | POA: Diagnosis not present

## 2016-05-05 DIAGNOSIS — L409 Psoriasis, unspecified: Secondary | ICD-10-CM | POA: Diagnosis not present

## 2016-05-05 DIAGNOSIS — M7581 Other shoulder lesions, right shoulder: Secondary | ICD-10-CM | POA: Diagnosis not present

## 2016-05-05 DIAGNOSIS — M1990 Unspecified osteoarthritis, unspecified site: Secondary | ICD-10-CM | POA: Diagnosis not present

## 2016-05-08 DIAGNOSIS — J0100 Acute maxillary sinusitis, unspecified: Secondary | ICD-10-CM | POA: Diagnosis not present

## 2016-05-29 DIAGNOSIS — L400 Psoriasis vulgaris: Secondary | ICD-10-CM | POA: Diagnosis not present

## 2016-05-29 DIAGNOSIS — M25551 Pain in right hip: Secondary | ICD-10-CM | POA: Diagnosis not present

## 2016-05-29 DIAGNOSIS — M1611 Unilateral primary osteoarthritis, right hip: Secondary | ICD-10-CM | POA: Diagnosis not present

## 2016-05-30 DIAGNOSIS — M25551 Pain in right hip: Secondary | ICD-10-CM | POA: Diagnosis not present

## 2016-06-04 DIAGNOSIS — M1611 Unilateral primary osteoarthritis, right hip: Secondary | ICD-10-CM | POA: Diagnosis not present

## 2016-06-09 DIAGNOSIS — N401 Enlarged prostate with lower urinary tract symptoms: Secondary | ICD-10-CM | POA: Diagnosis not present

## 2016-06-09 DIAGNOSIS — N138 Other obstructive and reflux uropathy: Secondary | ICD-10-CM | POA: Diagnosis not present

## 2016-06-12 DIAGNOSIS — M25551 Pain in right hip: Secondary | ICD-10-CM | POA: Diagnosis not present

## 2016-06-19 DIAGNOSIS — M1611 Unilateral primary osteoarthritis, right hip: Secondary | ICD-10-CM | POA: Diagnosis not present

## 2016-06-19 DIAGNOSIS — G8929 Other chronic pain: Secondary | ICD-10-CM | POA: Diagnosis not present

## 2016-06-19 DIAGNOSIS — M545 Low back pain: Secondary | ICD-10-CM | POA: Diagnosis not present

## 2016-06-19 DIAGNOSIS — M7061 Trochanteric bursitis, right hip: Secondary | ICD-10-CM | POA: Diagnosis not present

## 2016-07-02 DIAGNOSIS — M545 Low back pain: Secondary | ICD-10-CM | POA: Diagnosis not present

## 2016-07-03 DIAGNOSIS — M1611 Unilateral primary osteoarthritis, right hip: Secondary | ICD-10-CM | POA: Diagnosis not present

## 2016-07-08 ENCOUNTER — Other Ambulatory Visit: Payer: Self-pay | Admitting: Cardiology

## 2016-07-08 DIAGNOSIS — M1611 Unilateral primary osteoarthritis, right hip: Secondary | ICD-10-CM | POA: Diagnosis not present

## 2016-07-10 DIAGNOSIS — M1611 Unilateral primary osteoarthritis, right hip: Secondary | ICD-10-CM | POA: Diagnosis not present

## 2016-07-15 DIAGNOSIS — M1611 Unilateral primary osteoarthritis, right hip: Secondary | ICD-10-CM | POA: Diagnosis not present

## 2016-07-16 DIAGNOSIS — M7061 Trochanteric bursitis, right hip: Secondary | ICD-10-CM | POA: Diagnosis not present

## 2016-07-16 DIAGNOSIS — M1611 Unilateral primary osteoarthritis, right hip: Secondary | ICD-10-CM | POA: Diagnosis not present

## 2016-07-17 DIAGNOSIS — M1611 Unilateral primary osteoarthritis, right hip: Secondary | ICD-10-CM | POA: Diagnosis not present

## 2016-07-21 DIAGNOSIS — M1611 Unilateral primary osteoarthritis, right hip: Secondary | ICD-10-CM | POA: Diagnosis not present

## 2016-07-24 DIAGNOSIS — M1611 Unilateral primary osteoarthritis, right hip: Secondary | ICD-10-CM | POA: Diagnosis not present

## 2016-08-17 ENCOUNTER — Other Ambulatory Visit: Payer: Self-pay | Admitting: Cardiology

## 2016-09-11 DIAGNOSIS — R5383 Other fatigue: Secondary | ICD-10-CM | POA: Diagnosis not present

## 2016-09-11 DIAGNOSIS — L608 Other nail disorders: Secondary | ICD-10-CM | POA: Diagnosis not present

## 2016-09-11 DIAGNOSIS — E8770 Fluid overload, unspecified: Secondary | ICD-10-CM | POA: Diagnosis not present

## 2016-09-11 DIAGNOSIS — I10 Essential (primary) hypertension: Secondary | ICD-10-CM | POA: Diagnosis not present

## 2016-09-11 DIAGNOSIS — E039 Hypothyroidism, unspecified: Secondary | ICD-10-CM | POA: Diagnosis not present

## 2016-09-13 ENCOUNTER — Other Ambulatory Visit: Payer: Self-pay | Admitting: Cardiology

## 2016-09-15 NOTE — Telephone Encounter (Signed)
Rx request sent to pharmacy.  

## 2016-09-16 ENCOUNTER — Ambulatory Visit (INDEPENDENT_AMBULATORY_CARE_PROVIDER_SITE_OTHER): Payer: Medicare Other | Admitting: Cardiology

## 2016-09-16 ENCOUNTER — Encounter: Payer: Self-pay | Admitting: Cardiology

## 2016-09-16 VITALS — BP 150/90 | HR 99 | Ht 70.0 in | Wt 268.0 lb

## 2016-09-16 DIAGNOSIS — R600 Localized edema: Secondary | ICD-10-CM

## 2016-09-16 DIAGNOSIS — E7800 Pure hypercholesterolemia, unspecified: Secondary | ICD-10-CM | POA: Diagnosis not present

## 2016-09-16 DIAGNOSIS — I10 Essential (primary) hypertension: Secondary | ICD-10-CM

## 2016-09-16 DIAGNOSIS — I2510 Atherosclerotic heart disease of native coronary artery without angina pectoris: Secondary | ICD-10-CM

## 2016-09-16 MED ORDER — AMLODIPINE BESYLATE 5 MG PO TABS: 5.0000 mg | ORAL_TABLET | Freq: Every day | ORAL | 3 refills | Status: DC

## 2016-09-16 MED ORDER — FUROSEMIDE 20 MG PO TABS: 40.0000 mg | ORAL_TABLET | Freq: Every day | ORAL | 3 refills | Status: DC

## 2016-09-16 NOTE — Patient Instructions (Signed)
Medication Instructions:   INCREASE FUROSEMIDE TO 40 MG ONCE DAILY= 2 OF THE 20 MG TABLETS ONCE DAILY  START AMLODIPINE 5 MG ONCE DAILY  Labwork:  Your physician recommends that you return for lab work in: [place]  Testing/Procedures:  Your physician has requested that you have an echocardiogram. Echocardiography is a painless test that uses sound waves to create images of your heart. It provides your doctor with information about the size and shape of your heart and how well your heart's chambers and valves are working. This procedure takes approximately one hour. There are no restrictions for this procedure.    Follow-Up:  Your physician recommends that you schedule a follow-up appointment in: [place]   If you need a refill on your cardiac medications before your next appointment, please call your pharmacy.

## 2016-09-16 NOTE — Progress Notes (Signed)
HPI: FU CAD. Patient has had previous PCI of his LAD. His last echocardiogram in Oct 2009 showed an ejection fraction of 50-55% and trace aortic insufficiency. Abdominal ultrasound in January of 2014 showed no aneurysm. Patient admitted in April 2016 with chest pain. Cardiac catheterization revealed an ejection fraction of 55%. There is an 80% LAD followed by 40-50% lesion. The patient had PCI of his LAD with a drug-eluting stent. Echocardiogram July 2017 showed normal LV systolic function, mild diastolic dysfunction, trace aortic insufficiency and mild left atrial enlargement. Since last seen, patient notes fatigue and lack of energy. He has dyspnea and with more vigorous activities but not routine activities. No orthopnea or PND. He describes worsening pedal edema. He denies chest pain.  Current Outpatient Prescriptions  Medication Sig Dispense Refill  . albuterol (PROVENTIL HFA;VENTOLIN HFA) 108 (90 BASE) MCG/ACT inhaler Inhale 1-2 puffs into the lungs every 6 (six) hours as needed for wheezing or shortness of breath. 1 Inhaler 11  . Ascorbic Acid (VITAMIN C) 1000 MG tablet Take 1,000 mg by mouth daily.    Marland Kitchen aspirin EC 81 MG tablet Take 81 mg by mouth daily.     Marland Kitchen atorvastatin (LIPITOR) 80 MG tablet TAKE 1 TABLET (80 MG TOTAL) BY MOUTH DAILY. 90 tablet 3  . Cholecalciferol (VITAMIN D3) 1000 UNITS CAPS Take 1,000 Units by mouth daily.    . cycloSPORINE (RESTASIS) 0.05 % ophthalmic emulsion Place 1 drop into both eyes 2 (two) times daily.      . eszopiclone (LUNESTA) 2 MG TABS tablet Take 0.5 tablets by mouth as needed.    . fexofenadine (ALLEGRA) 180 MG tablet Take 180 mg by mouth daily.      . fluticasone (CUTIVATE) 0.05 % cream Apply 1 application topically every morning.  2  . furosemide (LASIX) 20 MG tablet TAKE 1 TABLET (20 MG TOTAL) BY MOUTH DAILY AS NEEDED FOR FLUID OR EDEMA. 30 tablet 6  . Glucosamine-Chondroit-Vit C-Mn (GLUCOSAMINE 1500 COMPLEX PO) Take 2 capsules by mouth daily.     . hydrocortisone (ANUSOL-HC) 2.5 % rectal cream Place 1 application rectally 2 (two) times daily.    Marland Kitchen KLOR-CON M10 10 MEQ tablet TAKE 1 TABLET (10 MEQ TOTAL) BY MOUTH DAILY. 30 tablet 6  . KLOR-CON M10 10 MEQ tablet TAKE 1 TABLET (10 MEQ TOTAL) BY MOUTH DAILY. 30 tablet 6  . levothyroxine (SYNTHROID, LEVOTHROID) 125 MCG tablet Take 125 mcg by mouth daily before breakfast.    . Multiple Vitamins-Minerals (ICAPS PO) Take 2 capsules by mouth 2 (two) times daily.    . Multiple Vitamins-Minerals (SENIOR MULTIVITAMIN PLUS PO) Once a day     . nitroGLYCERIN (NITROLINGUAL) 0.4 MG/SPRAY spray Place 1 spray under the tongue every 5 (five) minutes x 3 doses as needed for chest pain. Use as directed as needed 12 g 12  . NONFORMULARY OR COMPOUNDED ITEM Allergy Vaccine 1:10 Given at [facility]    . pantoprazole (PROTONIX) 40 MG tablet Take 30-60 min before first meal of the day    . potassium chloride (K-DUR) 10 MEQ tablet Take one tablet by mouth daily when taking lasix. 30 tablet 3   No current facility-administered medications for this visit.      Past Medical History:  Diagnosis Date  . Allergic asthma   . Barrett's esophagus   . BPH (benign prostatic hyperplasia)   . CAD (coronary artery disease)   . COPD (chronic obstructive pulmonary disease) ([place])   . GERD (gastroesophageal  reflux disease)   . Hiatal hernia   . Hyperlipidemia   . Hypothyroidism   . Psoriasis     Past Surgical History:  Procedure Laterality Date  . CORONARY ANGIOPLASTY WITH STENT PLACEMENT  08/15/14   PCI of pLAD Xience alpine DES  . LEFT HEART CATHETERIZATION WITH CORONARY ANGIOGRAM N/A 08/15/2014   Procedure: LEFT HEART CATHETERIZATION WITH CORONARY ANGIOGRAM;  Surgeon: Peter M Martinique, MD;  Location: [facility] CATH LAB;  Service: Cardiovascular;  Laterality: N/A;  . left knee arthroscopy    . NASAL POLYP EXCISION  1953  . NASAL SINUS SURGERY    . TONSILLECTOMY      Social History   Social History  . Marital  status: Married    Spouse name: N/A  . Number of children: N/A  . Years of education: N/A   Occupational History  . Minister    Social History Main Topics  . Smoking status: Former Smoker    Packs/day: 3.00    Years: 33.00    Types: Pipe, Cigars    Quit date: 04/28/1985  . Smokeless tobacco: Never Used  . Alcohol use 1.2 oz/week    1 Glasses of wine, 1 Shots of liquor per week  . Drug use: No  . Sexual activity: Not on file   Other Topics Concern  . Not on file   Social History Narrative  . No narrative on file    Family History  Problem Relation Age of Onset  . Lung cancer Father   . Dementia Mother   . COPD Mother     ROS: Fatigued but no fevers or chills, productive cough, hemoptysis, dysphasia, odynophagia, melena, hematochezia, dysuria, hematuria, rash, seizure activity, orthopnea, PND, claudication. Remaining systems are negative.  Physical Exam: Well-developed obese in no acute distress.  Skin is warm and dry.  HEENT is normal.  Neck is supple.  Chest is clear to auscultation with normal expansion.  Cardiovascular exam is regular rate and rhythm.  Abdominal exam nontender or distended. No masses palpated. Extremities show 1+ edema. neuro grossly intact  ECG- Sinus rhythm at a rate of 99. Right bundle branch block. personally reviewed  A/P  1 Coronary artery disease-continue aspirin and statin.  2 hypertension-blood pressure is elevated. Amlodipine 5 mg daily and follow.  3 hyperlipidemia-continue statin. Lipids and liver monitored by primary care.   4 history of edema-patient's edema is worse. Repeat echocardiogram. Increase Lasix to 40 mg daily. Check potassium and renal function in 1 week.  5 fatigue-question deconditioning from decreased exercise. He also states he has gained weight. We will obtain hemoglobin and TSH from his primary care physician.  Kirk Ruths, MD

## 2016-09-30 ENCOUNTER — Other Ambulatory Visit: Payer: Self-pay

## 2016-09-30 ENCOUNTER — Ambulatory Visit (INDEPENDENT_AMBULATORY_CARE_PROVIDER_SITE_OTHER): Payer: Medicare Other | Admitting: Podiatry

## 2016-09-30 ENCOUNTER — Encounter: Payer: Self-pay | Admitting: Podiatry

## 2016-09-30 ENCOUNTER — Ambulatory Visit (HOSPITAL_COMMUNITY): Payer: Medicare Other | Attending: Cardiovascular Disease

## 2016-09-30 VITALS — BP 153/94 | HR 93 | Resp 16 | Ht 70.5 in | Wt 265.0 lb

## 2016-09-30 DIAGNOSIS — J449 Chronic obstructive pulmonary disease, unspecified: Secondary | ICD-10-CM | POA: Diagnosis not present

## 2016-09-30 DIAGNOSIS — I348 Other nonrheumatic mitral valve disorders: Secondary | ICD-10-CM | POA: Insufficient documentation

## 2016-09-30 DIAGNOSIS — I2510 Atherosclerotic heart disease of native coronary artery without angina pectoris: Secondary | ICD-10-CM | POA: Insufficient documentation

## 2016-09-30 DIAGNOSIS — R600 Localized edema: Secondary | ICD-10-CM | POA: Diagnosis not present

## 2016-09-30 DIAGNOSIS — I517 Cardiomegaly: Secondary | ICD-10-CM | POA: Diagnosis not present

## 2016-09-30 DIAGNOSIS — E785 Hyperlipidemia, unspecified: Secondary | ICD-10-CM | POA: Insufficient documentation

## 2016-09-30 DIAGNOSIS — L603 Nail dystrophy: Secondary | ICD-10-CM | POA: Diagnosis not present

## 2016-09-30 LAB — ECHOCARDIOGRAM COMPLETE
Height: 70.5 in
Weight: 4240 oz

## 2016-09-30 NOTE — Progress Notes (Signed)
   Subjective:    Patient ID: [name], male    DOB: 06/13/1940, 76 y.o.   MRN: [id number]  HPI: He presents today with a chief concern of discolored hallux nail left knee states that appears to be thickened and appears to be loose as if it is getting ready to fall off. He isn't ordained [employer].    Review of Systems  Constitutional: Positive for activity change and appetite change.  HENT: Positive for sinus pain.   Musculoskeletal: Positive for back pain and myalgias.  Allergic/Immunologic: Positive for environmental allergies.  Hematological: Bruises/bleeds easily.  All other systems reviewed and are negative.      Objective:   Physical Exam: Vital signs are stable alert and oriented 3. Pulses are palpable. Neurologic sensorium is intact. Degenerative flexors are intact. Muscle strength is 5 over 5 dorsiflexion plantar flexors and inverters everters all his musculature is intact. Orthopedic evaluation inserts all joints distal to the ankle for range of motion without crepitation. Cutaneous evaluation demonstrates distal onycholysis to nearly the entire nail plate. The nail plate is thickened and yellow.        Assessment & Plan:  Distal onycholysis with a nail dystrophy hallux left.  Plan: Debridement of the hallux nail plate left. Follow-up with me as needed.

## 2016-10-08 DIAGNOSIS — E039 Hypothyroidism, unspecified: Secondary | ICD-10-CM | POA: Diagnosis not present

## 2016-10-08 DIAGNOSIS — R5383 Other fatigue: Secondary | ICD-10-CM | POA: Diagnosis not present

## 2016-10-08 DIAGNOSIS — Z6837 Body mass index (BMI) 37.0-37.9, adult: Secondary | ICD-10-CM | POA: Diagnosis not present

## 2016-11-18 DIAGNOSIS — E7800 Pure hypercholesterolemia, unspecified: Secondary | ICD-10-CM | POA: Diagnosis not present

## 2016-11-18 DIAGNOSIS — I10 Essential (primary) hypertension: Secondary | ICD-10-CM | POA: Diagnosis not present

## 2016-11-18 DIAGNOSIS — Z125 Encounter for screening for malignant neoplasm of prostate: Secondary | ICD-10-CM | POA: Diagnosis not present

## 2016-11-24 DIAGNOSIS — Z7982 Long term (current) use of aspirin: Secondary | ICD-10-CM | POA: Diagnosis not present

## 2016-11-24 DIAGNOSIS — L400 Psoriasis vulgaris: Secondary | ICD-10-CM | POA: Diagnosis not present

## 2016-11-24 DIAGNOSIS — R5383 Other fatigue: Secondary | ICD-10-CM | POA: Diagnosis not present

## 2016-11-24 DIAGNOSIS — Z955 Presence of coronary angioplasty implant and graft: Secondary | ICD-10-CM | POA: Diagnosis not present

## 2016-11-26 DIAGNOSIS — H16223 Keratoconjunctivitis sicca, not specified as Sjogren's, bilateral: Secondary | ICD-10-CM | POA: Diagnosis not present

## 2016-11-28 NOTE — Progress Notes (Signed)
HPI: FU CAD. Patient has had previous PCI of his LAD. Abdominal ultrasound in January of 2014 showed no aneurysm. Patient admitted in April 2016 with chest pain. Cardiac catheterization revealed an ejection fraction of 55%. There is an 80% LAD followed by 40-50% lesion. The patient had PCI of his LAD with a drug-eluting stent. Echocardiogram June 2018 showed normal LV function, grade 1 diastolic dysfunction and moderate left atrial enlargement. Since last seen, the patient has dyspnea with more extreme activities but not with routine activities. It is relieved with rest. It is not associated with chest pain. There is no orthopnea, PND or pedal edema. There is no syncope or palpitations. There is no exertional chest pain.   Current Outpatient Prescriptions  Medication Sig Dispense Refill  . albuterol (PROVENTIL HFA;VENTOLIN HFA) 108 (90 BASE) MCG/ACT inhaler Inhale 1-2 puffs into the lungs every 6 (six) hours as needed for wheezing or shortness of breath. 1 Inhaler 11  . amLODipine (NORVASC) 5 MG tablet Take 1 tablet (5 mg total) by mouth daily. 90 tablet 3  . Ascorbic Acid (VITAMIN C) 1000 MG tablet Take 1,000 mg by mouth daily.    Marland Kitchen aspirin EC 81 MG tablet Take 81 mg by mouth daily.     Marland Kitchen atorvastatin (LIPITOR) 80 MG tablet TAKE 1 TABLET (80 MG TOTAL) BY MOUTH DAILY. 90 tablet 3  . Cholecalciferol (VITAMIN D3) 1000 UNITS CAPS Take 1,000 Units by mouth daily.    . cycloSPORINE (RESTASIS) 0.05 % ophthalmic emulsion Place 1 drop into both eyes 2 (two) times daily.      . eszopiclone (LUNESTA) 2 MG TABS tablet Take 0.5 tablets by mouth as needed.    . fexofenadine (ALLEGRA) 180 MG tablet Take 180 mg by mouth daily.      . fluticasone (CUTIVATE) 0.05 % cream Apply 1 application topically every morning.  2  . furosemide (LASIX) 20 MG tablet Take 2 tablets (40 mg total) by mouth daily. 180 tablet 3  . Glucosamine-Chondroit-Vit C-Mn (GLUCOSAMINE 1500 COMPLEX PO) Take 2 capsules by mouth daily.      . hydrocortisone (ANUSOL-HC) 2.5 % rectal cream Place 1 application rectally 2 (two) times daily.    Marland Kitchen KLOR-CON M10 10 MEQ tablet TAKE 1 TABLET (10 MEQ TOTAL) BY MOUTH DAILY. 30 tablet 6  . KLOR-CON M10 10 MEQ tablet TAKE 1 TABLET (10 MEQ TOTAL) BY MOUTH DAILY. 30 tablet 6  . levothyroxine (SYNTHROID, LEVOTHROID) 125 MCG tablet Take 125 mcg by mouth daily before breakfast.    . Multiple Vitamins-Minerals (ICAPS PO) Take 2 capsules by mouth 2 (two) times daily.    . Multiple Vitamins-Minerals (SENIOR MULTIVITAMIN PLUS PO) Once a day     . nitroGLYCERIN (NITROLINGUAL) 0.4 MG/SPRAY spray Place 1 spray under the tongue every 5 (five) minutes x 3 doses as needed for chest pain. Use as directed as needed 12 g 12  . NONFORMULARY OR COMPOUNDED ITEM Allergy Vaccine 1:10 Given at [facility]    . pantoprazole (PROTONIX) 40 MG tablet Take 30-60 min before first meal of the day    . potassium chloride (K-DUR) 10 MEQ tablet Take one tablet by mouth daily when taking lasix. 30 tablet 3   No current facility-administered medications for this visit.      Past Medical History:  Diagnosis Date  . Allergic asthma   . Barrett's esophagus   . BPH (benign prostatic hyperplasia)   . CAD (coronary artery disease)   . COPD (chronic  obstructive pulmonary disease) ([place])   . GERD (gastroesophageal reflux disease)   . Hiatal hernia   . Hyperlipidemia   . Hypothyroidism   . Psoriasis     Past Surgical History:  Procedure Laterality Date  . CORONARY ANGIOPLASTY WITH STENT PLACEMENT  08/15/14   PCI of pLAD Xience alpine DES  . LEFT HEART CATHETERIZATION WITH CORONARY ANGIOGRAM N/A 08/15/2014   Procedure: LEFT HEART CATHETERIZATION WITH CORONARY ANGIOGRAM;  Surgeon: Peter M Martinique, MD;  Location: [facility] CATH LAB;  Service: Cardiovascular;  Laterality: N/A;  . left knee arthroscopy    . NASAL POLYP EXCISION  1953  . NASAL SINUS SURGERY    . TONSILLECTOMY      Social History   Social History  . Marital  status: Married    Spouse name: N/A  . Number of children: N/A  . Years of education: N/A   Occupational History  . Minister    Social History Main Topics  . Smoking status: Former Smoker    Packs/day: 3.00    Years: 33.00    Types: Pipe, Cigars    Quit date: 04/28/1985  . Smokeless tobacco: Never Used  . Alcohol use 1.2 oz/week    1 Glasses of wine, 1 Shots of liquor per week  . Drug use: No  . Sexual activity: Not on file   Other Topics Concern  . Not on file   Social History Narrative  . No narrative on file    Family History  Problem Relation Age of Onset  . Lung cancer Father   . Dementia Mother   . COPD Mother     ROS: no fevers or chills, productive cough, hemoptysis, dysphasia, odynophagia, melena, hematochezia, dysuria, hematuria, rash, seizure activity, orthopnea, PND, claudication. Remaining systems are negative.  Physical Exam: Well-developed obese in no acute distress.  Skin is warm and dry.  HEENT is normal.  Neck is supple.  Chest is clear to auscultation with normal expansion.  Cardiovascular exam is regular rate and rhythm.  Abdominal exam nontender or distended. No masses palpated. Extremities show no edema. neuro grossly intact    A/P  1 Coronary artery disease-patient without chest pain. Continue aspirin and statin.  2 hypertension-blood pressure is controlled. Continue present medications.  3 hyperlipidemia-continue statin.  4 lower extremity edema-improved. Continue present dose of Lasix. Check K and renal function.   Kirk Ruths, MD

## 2016-12-08 ENCOUNTER — Other Ambulatory Visit: Payer: Self-pay | Admitting: Cardiology

## 2016-12-08 DIAGNOSIS — E785 Hyperlipidemia, unspecified: Secondary | ICD-10-CM

## 2016-12-09 ENCOUNTER — Encounter: Payer: Self-pay | Admitting: *Deleted

## 2016-12-09 ENCOUNTER — Encounter: Payer: Self-pay | Admitting: Cardiology

## 2016-12-09 ENCOUNTER — Ambulatory Visit (INDEPENDENT_AMBULATORY_CARE_PROVIDER_SITE_OTHER): Payer: Medicare Other | Admitting: Cardiology

## 2016-12-09 VITALS — BP 124/62 | HR 88 | Ht 70.5 in | Wt 257.0 lb

## 2016-12-09 DIAGNOSIS — E7800 Pure hypercholesterolemia, unspecified: Secondary | ICD-10-CM

## 2016-12-09 DIAGNOSIS — I10 Essential (primary) hypertension: Secondary | ICD-10-CM | POA: Diagnosis not present

## 2016-12-09 DIAGNOSIS — I2510 Atherosclerotic heart disease of native coronary artery without angina pectoris: Secondary | ICD-10-CM

## 2016-12-09 NOTE — Patient Instructions (Signed)

## 2016-12-10 LAB — BASIC METABOLIC PANEL
BUN/Creatinine Ratio: 16 (ref 10–24)
BUN: 13 mg/dL (ref 8–27)
CALCIUM: 9.5 mg/dL (ref 8.6–10.2)
CHLORIDE: 103 mmol/L (ref 96–106)
CO2: 23 mmol/L (ref 20–29)
Creatinine, Ser: 0.81 mg/dL (ref 0.76–1.27)
GFR calc non Af Amer: 86 mL/min/{1.73_m2} (ref >59)
GFR, EST AFRICAN AMERICAN: 99 mL/min/{1.73_m2} (ref >59)
Glucose: 121 mg/dL — ABNORMAL HIGH (ref 65–99)
POTASSIUM: 4.1 mmol/L (ref 3.5–5.2)
SODIUM: 141 mmol/L (ref 134–144)

## 2016-12-11 DIAGNOSIS — E039 Hypothyroidism, unspecified: Secondary | ICD-10-CM | POA: Diagnosis not present

## 2016-12-12 DIAGNOSIS — M545 Low back pain: Secondary | ICD-10-CM | POA: Diagnosis not present

## 2016-12-12 DIAGNOSIS — M7061 Trochanteric bursitis, right hip: Secondary | ICD-10-CM | POA: Diagnosis not present

## 2016-12-12 DIAGNOSIS — M1611 Unilateral primary osteoarthritis, right hip: Secondary | ICD-10-CM | POA: Diagnosis not present

## 2016-12-12 DIAGNOSIS — G8929 Other chronic pain: Secondary | ICD-10-CM | POA: Diagnosis not present

## 2016-12-25 DIAGNOSIS — L908 Other atrophic disorders of skin: Secondary | ICD-10-CM | POA: Diagnosis not present

## 2016-12-25 DIAGNOSIS — H02402 Unspecified ptosis of left eyelid: Secondary | ICD-10-CM | POA: Diagnosis not present

## 2016-12-25 DIAGNOSIS — H02401 Unspecified ptosis of right eyelid: Secondary | ICD-10-CM | POA: Diagnosis not present

## 2016-12-25 DIAGNOSIS — H5340 Unspecified visual field defects: Secondary | ICD-10-CM | POA: Diagnosis not present

## 2016-12-25 DIAGNOSIS — H02403 Unspecified ptosis of bilateral eyelids: Secondary | ICD-10-CM | POA: Diagnosis not present

## 2016-12-30 DIAGNOSIS — E039 Hypothyroidism, unspecified: Secondary | ICD-10-CM | POA: Diagnosis not present

## 2016-12-30 DIAGNOSIS — R5383 Other fatigue: Secondary | ICD-10-CM | POA: Diagnosis not present

## 2016-12-30 DIAGNOSIS — F411 Generalized anxiety disorder: Secondary | ICD-10-CM | POA: Diagnosis not present

## 2017-01-08 DIAGNOSIS — Z23 Encounter for immunization: Secondary | ICD-10-CM | POA: Diagnosis not present

## 2017-01-13 DIAGNOSIS — M545 Low back pain: Secondary | ICD-10-CM | POA: Diagnosis not present

## 2017-01-26 HISTORY — PX: EYE SURGERY: SHX253

## 2017-01-27 DIAGNOSIS — M7061 Trochanteric bursitis, right hip: Secondary | ICD-10-CM | POA: Diagnosis not present

## 2017-01-27 DIAGNOSIS — M5441 Lumbago with sciatica, right side: Secondary | ICD-10-CM | POA: Diagnosis not present

## 2017-01-27 DIAGNOSIS — M545 Low back pain: Secondary | ICD-10-CM | POA: Diagnosis not present

## 2017-01-27 DIAGNOSIS — G8929 Other chronic pain: Secondary | ICD-10-CM | POA: Diagnosis not present

## 2017-02-11 DIAGNOSIS — F411 Generalized anxiety disorder: Secondary | ICD-10-CM | POA: Diagnosis not present

## 2017-02-11 DIAGNOSIS — K5900 Constipation, unspecified: Secondary | ICD-10-CM | POA: Diagnosis not present

## 2017-02-20 ENCOUNTER — Other Ambulatory Visit: Payer: Self-pay

## 2017-02-20 DIAGNOSIS — M1611 Unilateral primary osteoarthritis, right hip: Secondary | ICD-10-CM | POA: Diagnosis not present

## 2017-02-20 DIAGNOSIS — M545 Low back pain: Secondary | ICD-10-CM | POA: Diagnosis not present

## 2017-02-20 DIAGNOSIS — G8929 Other chronic pain: Secondary | ICD-10-CM | POA: Diagnosis not present

## 2017-02-20 DIAGNOSIS — K2270 Barrett's esophagus without dysplasia: Secondary | ICD-10-CM | POA: Diagnosis not present

## 2017-02-20 DIAGNOSIS — Z86010 Personal history of colonic polyps: Secondary | ICD-10-CM | POA: Diagnosis not present

## 2017-02-20 DIAGNOSIS — K5901 Slow transit constipation: Secondary | ICD-10-CM | POA: Diagnosis not present

## 2017-02-20 MED ORDER — KLOR-CON M10 10 MEQ PO TBCR: EXTENDED_RELEASE_TABLET | ORAL | 6 refills | Status: DC

## 2017-03-04 DIAGNOSIS — M1611 Unilateral primary osteoarthritis, right hip: Secondary | ICD-10-CM | POA: Diagnosis not present

## 2017-03-09 DIAGNOSIS — K219 Gastro-esophageal reflux disease without esophagitis: Secondary | ICD-10-CM | POA: Diagnosis not present

## 2017-03-09 DIAGNOSIS — G4733 Obstructive sleep apnea (adult) (pediatric): Secondary | ICD-10-CM | POA: Diagnosis not present

## 2017-03-09 DIAGNOSIS — R609 Edema, unspecified: Secondary | ICD-10-CM | POA: Diagnosis not present

## 2017-03-09 DIAGNOSIS — J309 Allergic rhinitis, unspecified: Secondary | ICD-10-CM | POA: Diagnosis not present

## 2017-03-09 DIAGNOSIS — K2270 Barrett's esophagus without dysplasia: Secondary | ICD-10-CM | POA: Diagnosis not present

## 2017-03-09 DIAGNOSIS — E039 Hypothyroidism, unspecified: Secondary | ICD-10-CM | POA: Diagnosis not present

## 2017-03-09 DIAGNOSIS — H5340 Unspecified visual field defects: Secondary | ICD-10-CM | POA: Diagnosis not present

## 2017-03-09 DIAGNOSIS — I25110 Atherosclerotic heart disease of native coronary artery with unstable angina pectoris: Secondary | ICD-10-CM | POA: Diagnosis not present

## 2017-03-09 DIAGNOSIS — K449 Diaphragmatic hernia without obstruction or gangrene: Secondary | ICD-10-CM | POA: Diagnosis not present

## 2017-03-09 DIAGNOSIS — I10 Essential (primary) hypertension: Secondary | ICD-10-CM | POA: Diagnosis not present

## 2017-03-09 DIAGNOSIS — Z87891 Personal history of nicotine dependence: Secondary | ICD-10-CM | POA: Diagnosis not present

## 2017-03-09 DIAGNOSIS — Z955 Presence of coronary angioplasty implant and graft: Secondary | ICD-10-CM | POA: Diagnosis not present

## 2017-03-09 DIAGNOSIS — G4700 Insomnia, unspecified: Secondary | ICD-10-CM | POA: Diagnosis not present

## 2017-03-09 DIAGNOSIS — F329 Major depressive disorder, single episode, unspecified: Secondary | ICD-10-CM | POA: Diagnosis not present

## 2017-03-09 DIAGNOSIS — H02403 Unspecified ptosis of bilateral eyelids: Secondary | ICD-10-CM | POA: Diagnosis not present

## 2017-03-09 DIAGNOSIS — Z7982 Long term (current) use of aspirin: Secondary | ICD-10-CM | POA: Diagnosis not present

## 2017-03-09 DIAGNOSIS — E785 Hyperlipidemia, unspecified: Secondary | ICD-10-CM | POA: Diagnosis not present

## 2017-03-16 DIAGNOSIS — Z9889 Other specified postprocedural states: Secondary | ICD-10-CM | POA: Diagnosis not present

## 2017-03-17 DIAGNOSIS — F411 Generalized anxiety disorder: Secondary | ICD-10-CM | POA: Diagnosis not present

## 2017-03-17 DIAGNOSIS — K5900 Constipation, unspecified: Secondary | ICD-10-CM | POA: Diagnosis not present

## 2017-03-28 HISTORY — PX: COLONOSCOPY: SHX174

## 2017-04-09 DIAGNOSIS — K2270 Barrett's esophagus without dysplasia: Secondary | ICD-10-CM | POA: Diagnosis not present

## 2017-04-09 DIAGNOSIS — D126 Benign neoplasm of colon, unspecified: Secondary | ICD-10-CM | POA: Diagnosis not present

## 2017-04-09 DIAGNOSIS — Z86010 Personal history of colonic polyps: Secondary | ICD-10-CM | POA: Diagnosis not present

## 2017-04-09 DIAGNOSIS — K5730 Diverticulosis of large intestine without perforation or abscess without bleeding: Secondary | ICD-10-CM | POA: Diagnosis not present

## 2017-04-09 DIAGNOSIS — K449 Diaphragmatic hernia without obstruction or gangrene: Secondary | ICD-10-CM | POA: Diagnosis not present

## 2017-04-14 DIAGNOSIS — D126 Benign neoplasm of colon, unspecified: Secondary | ICD-10-CM | POA: Diagnosis not present

## 2017-04-14 DIAGNOSIS — K2270 Barrett's esophagus without dysplasia: Secondary | ICD-10-CM | POA: Diagnosis not present

## 2017-05-18 ENCOUNTER — Telehealth: Payer: Self-pay

## 2017-05-18 NOTE — Telephone Encounter (Signed)
Left pt a detailed message to call back re his preop clearance reqest.

## 2017-05-18 NOTE — Telephone Encounter (Signed)
   [facility] Pre-operative Risk Assessment    Request for surgical clearance:  1. What type of surgery is being performed? Right Total Hip Arthroplasty   2. When is this surgery scheduled? tbd   3. What type of clearance is required (medical clearance vs. Pharmacy clearance to hold med vs. Both)? Medical clearance  4. Are there any medications that need to be held prior to surgery and how long? Please advise!  5. Practice name and name of physician performing surgery? [facility] Dr Aaron Edelman [place]    6. What is your office phone and fax number? Fax [phone] ph. [phone]  7. Anesthesia type (None, local, MAC, general) ? [name], [name] 05/18/2017, 11:13 AM  _________________________________________________________________   (provider comments below)

## 2017-05-18 NOTE — Telephone Encounter (Signed)
Please call pt to find out timing of surgery. He already has an apt with Dr. Stanford Breed on 2/19. Respond back as to timing so that we can arrange our approach to the clearance.   Daune Perch, AGNP-C [facility] 05/18/2017  3:43 PM

## 2017-05-19 ENCOUNTER — Telehealth: Payer: Self-pay | Admitting: Cardiology

## 2017-05-19 ENCOUNTER — Ambulatory Visit (INDEPENDENT_AMBULATORY_CARE_PROVIDER_SITE_OTHER): Payer: Medicare PPO | Admitting: Cardiology

## 2017-05-19 ENCOUNTER — Encounter: Payer: Self-pay | Admitting: Cardiology

## 2017-05-19 VITALS — BP 112/70 | HR 90 | Ht 70.5 in | Wt 249.0 lb

## 2017-05-19 DIAGNOSIS — I10 Essential (primary) hypertension: Secondary | ICD-10-CM

## 2017-05-19 DIAGNOSIS — Z9861 Coronary angioplasty status: Secondary | ICD-10-CM

## 2017-05-19 DIAGNOSIS — Z01818 Encounter for other preprocedural examination: Secondary | ICD-10-CM | POA: Insufficient documentation

## 2017-05-19 DIAGNOSIS — I2510 Atherosclerotic heart disease of native coronary artery without angina pectoris: Secondary | ICD-10-CM

## 2017-05-19 DIAGNOSIS — I4510 Unspecified right bundle-branch block: Secondary | ICD-10-CM

## 2017-05-19 NOTE — Assessment & Plan Note (Signed)
Seen today for pre clearance for hip surgery

## 2017-05-19 NOTE — Assessment & Plan Note (Signed)
Controlled.  

## 2017-05-19 NOTE — Telephone Encounter (Signed)
New message    Patient returning call re pre op clearance

## 2017-05-19 NOTE — Telephone Encounter (Signed)
Called [facility] left message for surgery coordinator to give the office a call with a date for patients surgery.   Spoke with patient and he said he doesn't have a date set the surgeon is waiting on clearance from our office and he wanted to be seen before his appointment with Dr Stanford Breed.  Appointment made today to see Lurena Joiner; patient voiced understanding.

## 2017-05-19 NOTE — Patient Instructions (Signed)
Medication Instructions:  Your physician recommends that you continue on your current medications as directed. Please refer to the Current Medication list given to you today.   Labwork: NONE  Testing/Procedures: NONE  Follow-Up: Your physician wants you to follow-up in: [place] will receive a reminder letter in the mail two months in advance. If you don't receive a letter, please call our office to schedule the follow-up appointment.  Any Other Special Instructions Will Be Listed Below (If Applicable). YOU ARE CLEARED FOR SURGEY  If you need a refill on your cardiac medications before your next appointment, please call your pharmacy.

## 2017-05-19 NOTE — Assessment & Plan Note (Signed)
Last cath April 2016- LAD PCI with DES Currently stable, chest pain free

## 2017-05-19 NOTE — Progress Notes (Signed)
05/19/2017 [name]   [birth year]  [id number]  Primary Physician Dr Shelia [name] Primary Cardiologist: Dr [name]  HPI:  Pleasant 77 y/o male with a history of CAD, s/p LAD PCI with DES April 2016. He asked to be added on to my schedule this afternoon to get clearance for hip surgery. He has done well since Dr [name] saw him in Aug 2018. No chest pain, no NTG use. He says if it wasn't for his hip he'd be doing great.    Current Outpatient Medications  Medication Sig Dispense Refill  . albuterol (PROVENTIL HFA;VENTOLIN HFA) 108 (90 BASE) MCG/ACT inhaler Inhale 1-2 puffs into the lungs every 6 (six) hours as needed for wheezing or shortness of breath. 1 Inhaler 11  . Ascorbic Acid (VITAMIN C) 1000 MG tablet Take 1,000 mg by mouth daily.    Marland Kitchen aspirin EC 81 MG tablet Take 81 mg by mouth daily.     Marland Kitchen atorvastatin (LIPITOR) 80 MG tablet TAKE 1 TABLET (80 MG TOTAL) BY MOUTH DAILY. 90 tablet 3  . betamethasone dipropionate (DIPROLENE) 0.05 % cream APPLY TO AFFECTED AREA ON SKIN TWICE A DAY  3  . buPROPion (WELLBUTRIN XL) 150 MG 24 hr tablet TAKE 1 TABLET BY MOUTH EVERY MORNING FOR 6 DAYS, THEN INCREASE TO 1 TABLET TWICE A DAY  12  . Cholecalciferol (VITAMIN D3) 1000 UNITS CAPS Take 1,000 Units by mouth daily.    . Cyanocobalamin (VITAMIN B-12 PO) Take 5,000 mcg by mouth.    . cycloSPORINE (RESTASIS) 0.05 % ophthalmic emulsion Place 1 drop into both eyes 2 (two) times daily.      . fluticasone (CUTIVATE) 0.05 % cream Apply 1 application topically every morning.  2  . furosemide (LASIX) 20 MG tablet Take 2 tablets (40 mg total) by mouth daily. 180 tablet 3  . Glucosamine-Chondroit-Vit C-Mn (GLUCOSAMINE 1500 COMPLEX PO) Take 2 capsules by mouth daily.    . Glucosamine-Chondroitin 1500-1200 MG/30ML LIQD Glucosamine Chondroitin    . KLOR-CON M10 10 MEQ tablet TAKE 1 TABLET (10 MEQ TOTAL) BY MOUTH DAILY. 30 tablet 6  . levothyroxine (SYNTHROID, LEVOTHROID) 125 MCG tablet Take 125 mcg by mouth daily  before breakfast.    . Melatonin 5 MG CAPS Take by mouth.    . Multiple Vitamins-Minerals (ICAPS PO) Take 2 capsules by mouth 2 (two) times daily.    . Multiple Vitamins-Minerals (SENIOR MULTIVITAMIN PLUS PO) Once a day     . nitroGLYCERIN (NITROLINGUAL) 0.4 MG/SPRAY spray Place 1 spray under the tongue every 5 (five) minutes x 3 doses as needed for chest pain. Use as directed as needed 12 g 12  . NONFORMULARY OR COMPOUNDED ITEM Allergy Vaccine 1:10 Given at [facility]    . Omega-3 1000 MG CAPS Omega-3    . pantoprazole (PROTONIX) 40 MG tablet Take 30-60 min before first meal of the day    . sildenafil (VIAGRA) 100 MG tablet Take 100 mg by mouth daily as needed for erectile dysfunction.    Marland Kitchen amLODipine (NORVASC) 5 MG tablet Take 1 tablet (5 mg total) by mouth daily. 90 tablet 3   No current facility-administered medications for this visit.     No Known Allergies  Past Medical History:  Diagnosis Date  . Allergic asthma   . Barrett's esophagus   . BPH (benign prostatic hyperplasia)   . CAD (coronary artery disease)   . COPD (chronic obstructive pulmonary disease) ([place])   . GERD (gastroesophageal reflux disease)   .  Hiatal hernia   . Hyperlipidemia   . Hypothyroidism   . Psoriasis     Social History   Socioeconomic History  . Marital status: Married    Spouse name: Not on file  . Number of children: Not on file  . Years of education: Not on file  . Highest education level: Not on file  Social Needs  . Financial resource strain: Not on file  . Food insecurity - worry: Not on file  . Food insecurity - inability: Not on file  . Transportation needs - medical: Not on file  . Transportation needs - non-medical: Not on file  Occupational History  . Occupation: Company secretary  Tobacco Use  . Smoking status: Former Smoker    Packs/day: 3.00    Years: 33.00    Pack years: 99.00    Types: Pipe, Cigars    Last attempt to quit: 04/28/1985    Years since quitting: 32.0  .  Smokeless tobacco: Never Used  Substance and Sexual Activity  . Alcohol use: Yes    Alcohol/week: 1.2 oz    Types: 1 Glasses of wine, 1 Shots of liquor per week  . Drug use: No  . Sexual activity: Not on file  Other Topics Concern  . Not on file  Social History Narrative  . Not on file     Family History  Problem Relation Age of Onset  . Lung cancer Father   . Dementia Mother   . COPD Mother      Review of Systems: General: negative for chills, fever, night sweats or weight changes.  Cardiovascular: negative for chest pain, dyspnea on exertion, edema, orthopnea, palpitations, paroxysmal nocturnal dyspnea or shortness of breath Dermatological: negative for rash Respiratory: negative for cough or wheezing Urologic: negative for hematuria Abdominal: negative for nausea, vomiting, diarrhea, bright red blood per rectum, melena, or hematemesis Neurologic: negative for visual changes, syncope, or dizziness All other systems reviewed and are otherwise negative except as noted above.    Blood pressure 112/70, pulse 90, height 5' 10.5" (1.791 m), weight 249 lb (112.9 kg), SpO2 98 %.  General appearance: alert, cooperative and no distress Neck: no carotid bruit and no JVD Lungs: clear to auscultation bilaterally Heart: regular rate and rhythm Extremities: extremities normal, atraumatic, no cyanosis or edema Skin: Skin color, texture, turgor normal. No rashes or lesions Neurologic: Grossly normal  EKG NSR, RBBB, LAD, 1st degree AVB  ASSESSMENT AND PLAN:   Pre-operative clearance Seen today for pre clearance for hip surgery  CAD S/P PCI LAD 1987, `1997, 08/15/14 PCI pLAD DES Last cath April 2016- LAD PCI with DES Currently stable, chest pain free  Essential hypertension Controlled  RBBB Chronic  PLAN  Chart reviewed as part of pre-operative protocol coverage. Given past medical history and time since last visit, based on ACC/AHA guidelines, [name] would be at  acceptable risk for the planned procedure without further cardiovascular testing.   Please call with questions.  James Ransom, PA-C 05/19/2017, 4:30 PM

## 2017-05-19 NOTE — Assessment & Plan Note (Signed)
Chronic. 

## 2017-05-29 ENCOUNTER — Ambulatory Visit: Payer: Self-pay | Admitting: Orthopedic Surgery

## 2017-06-02 ENCOUNTER — Ambulatory Visit: Payer: Self-pay | Admitting: Orthopedic Surgery

## 2017-06-02 ENCOUNTER — Ambulatory Visit: Payer: Medicare Other | Admitting: Physician Assistant

## 2017-06-02 NOTE — Progress Notes (Signed)
HPI: FU CAD. Patient has had previous PCI of his LAD. Abdominal ultrasound in January of 2014 showed no aneurysm. Patient admitted in April 2016 with chest pain. Cardiac catheterization revealed an ejection fraction of 55%. There is an 80% LAD followed by 40-50% lesion. The patient had PCI of his LAD with a drug-eluting stent. Echocardiogram June 2018 showed normal LV function, grade 1 diastolic dysfunction and moderate left atrial enlargement. Since last seen,  patient denies dyspnea, chest pain, palpitations or syncope.  He does have pedal edema.  Hip pain but  Current Outpatient Medications  Medication Sig Dispense Refill  . albuterol (PROVENTIL HFA;VENTOLIN HFA) 108 (90 BASE) MCG/ACT inhaler Inhale 1-2 puffs into the lungs every 6 (six) hours as needed for wheezing or shortness of breath. 1 Inhaler 11  . atorvastatin (LIPITOR) 40 MG tablet Take 40 mg by mouth every evening.    Marland Kitchen atorvastatin (LIPITOR) 80 MG tablet TAKE 1 TABLET (80 MG TOTAL) BY MOUTH DAILY. 90 tablet 3  . betamethasone dipropionate (DIPROLENE) 0.05 % cream APPLY TO AFFECTED AREA ON SKIN TWICE A DAY  3  . cycloSPORINE (RESTASIS) 0.05 % ophthalmic emulsion Place 1 drop into both eyes 2 (two) times daily.      . fexofenadine (ALLEGRA) 180 MG tablet Take 180 mg by mouth daily.    . fluticasone (CUTIVATE) 0.05 % cream Apply 1 application topically 2 (two) times daily.   2  . furosemide (LASIX) 20 MG tablet Take 2 tablets (40 mg total) by mouth daily. 180 tablet 3  . KLOR-CON M10 10 MEQ tablet TAKE 1 TABLET (10 MEQ TOTAL) BY MOUTH DAILY. 30 tablet 6  . levothyroxine (SYNTHROID, LEVOTHROID) 137 MCG tablet Take 137 mcg by mouth daily before breakfast.    . LUTEIN PO Take 1 tablet by mouth daily.    . Melatonin 5 MG CAPS Take 5 mg by mouth at bedtime.     . Misc Natural Products (PROSTATE HEALTH) CAPS Take 1 capsule by mouth daily.    . naproxen sodium (ALEVE) 220 MG tablet Take 440 mg by mouth 2 (two) times daily as needed  (pain).    . nitroGLYCERIN (NITROLINGUAL) 0.4 MG/SPRAY spray Place 1 spray under the tongue every 5 (five) minutes x 3 doses as needed for chest pain. Use as directed as needed 12 g 12  . NONFORMULARY OR COMPOUNDED ITEM Allergy Vaccine 1:10 Given at [facility]    . pantoprazole (PROTONIX) 40 MG tablet Take 30-60 min before first meal of the day (Patient taking differently: Take 40 mg once daily 30-60 min before first meal of the day)    . sildenafil (VIAGRA) 100 MG tablet Take 100 mg by mouth daily as needed for erectile dysfunction.    Marland Kitchen amLODipine (NORVASC) 5 MG tablet Take 1 tablet (5 mg total) by mouth daily. 90 tablet 3  . aspirin EC 81 MG tablet Take 81 mg by mouth daily.     . Cholecalciferol (VITAMIN D3) 1000 UNITS CAPS Take 1,000 Units by mouth daily.    . Cyanocobalamin (VITAMIN B-12 PO) Take 5,000 mcg by mouth.    . Glucosamine-Chondroit-Vit C-Mn (GLUCOSAMINE 1500 COMPLEX PO) Take 2 capsules by mouth daily.    . Multiple Vitamins-Minerals (SENIOR MULTIVITAMIN PLUS PO) Take 1 tablet by mouth daily.     . Omega-3 1000 MG CAPS Take 1000 mg by mouth once daily    . vitamin C (ASCORBIC ACID) 500 MG tablet Take 1,000 mg by mouth daily.  No current facility-administered medications for this visit.      Past Medical History:  Diagnosis Date  . Allergic asthma   . Barrett's esophagus   . BPH (benign prostatic hyperplasia)   . CAD (coronary artery disease)   . COPD (chronic obstructive pulmonary disease) ([place])   . GERD (gastroesophageal reflux disease)   . Hiatal hernia   . Hyperlipidemia   . Hypothyroidism   . Psoriasis   . Sleep apnea    wears CPAP nightly    Past Surgical History:  Procedure Laterality Date  . COLONOSCOPY  03/2017  . CORONARY ANGIOPLASTY WITH STENT PLACEMENT  08/15/14   PCI of pLAD Xience alpine DES  . EYE SURGERY  01/2017   eyelids lifted  . LEFT HEART CATHETERIZATION WITH CORONARY ANGIOGRAM N/A 08/15/2014   Procedure: LEFT HEART  CATHETERIZATION WITH CORONARY ANGIOGRAM;  Surgeon: Peter M Martinique, MD;  Location: [facility] CATH LAB;  Service: Cardiovascular;  Laterality: N/A;  . left knee arthroscopy    . NASAL POLYP EXCISION  1953  . NASAL SINUS SURGERY    . TONSILLECTOMY      Social History   Socioeconomic History  . Marital status: Married    Spouse name: Not on file  . Number of children: Not on file  . Years of education: Not on file  . Highest education level: Not on file  Social Needs  . Financial resource strain: Not on file  . Food insecurity - worry: Not on file  . Food insecurity - inability: Not on file  . Transportation needs - medical: Not on file  . Transportation needs - non-medical: Not on file  Occupational History  . Occupation: Company secretary  Tobacco Use  . Smoking status: Former Smoker    Packs/day: 3.00    Years: 33.00    Pack years: 99.00    Types: Pipe, Cigars    Last attempt to quit: 04/28/1985    Years since quitting: 32.1  . Smokeless tobacco: Never Used  Substance and Sexual Activity  . Alcohol use: Yes    Alcohol/week: 1.2 oz    Types: 1 Glasses of wine, 1 Shots of liquor per week  . Drug use: No  . Sexual activity: Not on file  Other Topics Concern  . Not on file  Social History Narrative  . Not on file    Family History  Problem Relation Age of Onset  . Lung cancer Father   . Dementia Mother   . COPD Mother     ROS: Hip pain but no fevers or chills, productive cough, hemoptysis, dysphasia, odynophagia, melena, hematochezia, dysuria, hematuria, rash, seizure activity, orthopnea, PND, claudication. Remaining systems are negative.  Physical Exam: Well-developed obese in no acute distress.  Skin is warm and dry.  HEENT is normal.  Neck is supple.  Chest is clear to auscultation with normal expansion.  Cardiovascular exam is regular rate and rhythm.  Abdominal exam nontender or distended. No masses palpated. Extremities show 1+ edema. neuro grossly intact  A/P  1  coronary artery disease-patient continues to do well with no chest pain.  Plan to continue medical therapy with aspirin and statin.  2 hypertension-blood pressure is controlled.  Continue present medications.  3 hyperlipidemia-continue statin.  4 lower extremity edema-continue present dose of Lasix.  Patient instructed on low-sodium diet and fluid restriction.  I asked him to keep his feet elevated and we also discussed compression hose.    5 Preoperative evaluation prior to hip surgery- patient has  had no chest pain.  He may proceed without further cardiac evaluation.  Continue present medications pre-and postoperatively.    Kirk Ruths, MD

## 2017-06-02 NOTE — H&P (Signed)
TOTAL HIP ADMISSION H&P  Patient is admitted for right total hip arthroplasty.  Subjective:  Chief Complaint: right hip pain  HPI: [name], 77 y.o. male, has a history of pain and functional disability in the right hip(s) due to arthritis and patient has failed non-surgical conservative treatments for greater than 12 weeks to include NSAID's and/or analgesics, flexibility and strengthening excercises, supervised PT with diminished ADL's post treatment, use of assistive devices, weight reduction as appropriate and activity modification.  Onset of symptoms was gradual starting 1 years ago with rapidlly worsening course since that time.The patient noted no past surgery on the right hip(s).  Patient currently rates pain in the right hip at 10 out of 10 with activity. Patient has night pain, worsening of pain with activity and weight bearing, trendelenberg gait, pain that interfers with activities of daily living and pain with passive range of motion. Patient has evidence of subchondral cysts, subchondral sclerosis, periarticular osteophytes and joint space narrowing by imaging studies. This condition presents safety issues increasing the risk of falls.   There is no current active infection.  Patient Active Problem List   Diagnosis Date Noted  . Pre-operative clearance 05/19/2017  . Weight gain 03/30/2015  . Constipation 12/28/2014  . Bilateral lower extremity edema 12/28/2014  . RBBB 08/14/2014  . Psoriasis 08/14/2014  . Chest pain with high risk of acute coronary syndrome 08/14/2014  . Essential hypertension 08/14/2014  . Unstable angina ([place]) 08/14/2014  . Hoarseness 06/15/2014  . Rash and nonspecific skin eruption 06/15/2014  . Submandibular swelling 05/15/2013  . Asthma with acute exacerbation 08/18/2012  . Eustachian tube dysfunction 04/05/2012  . Allergic rhinitis due to pollen 06/28/2010  . ABDOMINAL BRUIT 03/04/2010  . Dyslipidemia 03/07/2009  . CAD S/P PCI LAD 1987, `1997,  08/15/14 PCI pLAD DES 03/07/2009  . BPH (benign prostatic hyperplasia) 03/07/2009  . Hypothyroidism 03/06/2009  . Allergic-infective asthma 04/06/2007  . Centrilobular emphysema ([place]) 04/06/2007   Past Medical History:  Diagnosis Date  . Allergic asthma   . Barrett's esophagus   . BPH (benign prostatic hyperplasia)   . CAD (coronary artery disease)   . COPD (chronic obstructive pulmonary disease) ([place])   . GERD (gastroesophageal reflux disease)   . Hiatal hernia   . Hyperlipidemia   . Hypothyroidism   . Psoriasis     Past Surgical History:  Procedure Laterality Date  . CORONARY ANGIOPLASTY WITH STENT PLACEMENT  08/15/14   PCI of pLAD Xience alpine DES  . LEFT HEART CATHETERIZATION WITH CORONARY ANGIOGRAM N/A 08/15/2014   Procedure: LEFT HEART CATHETERIZATION WITH CORONARY ANGIOGRAM;  Surgeon: Peter M Martinique, MD;  Location: [facility] CATH LAB;  Service: Cardiovascular;  Laterality: N/A;  . left knee arthroscopy    . NASAL POLYP EXCISION  1953  . NASAL SINUS SURGERY    . TONSILLECTOMY      Current Outpatient Medications  Medication Sig Dispense Refill Last Dose  . albuterol (PROVENTIL HFA;VENTOLIN HFA) 108 (90 BASE) MCG/ACT inhaler Inhale 1-2 puffs into the lungs every 6 (six) hours as needed for wheezing or shortness of breath. (Patient not taking: Reported on 06/02/2017) 1 Inhaler 11 Not Taking at Unknown time  . amLODipine (NORVASC) 5 MG tablet Take 1 tablet (5 mg total) by mouth daily. 90 tablet 3 Taking  . aspirin EC 81 MG tablet Take 81 mg by mouth daily.    Taking  . atorvastatin (LIPITOR) 40 MG tablet Take 40 mg by mouth every evening.     Marland Kitchen  atorvastatin (LIPITOR) 80 MG tablet TAKE 1 TABLET (80 MG TOTAL) BY MOUTH DAILY. (Patient not taking: Reported on 06/02/2017) 90 tablet 3 Not Taking at Unknown time  . betamethasone dipropionate (DIPROLENE) 0.05 % cream APPLY TO AFFECTED AREA ON SKIN TWICE A DAY  3 Taking  . buPROPion (WELLBUTRIN XL) 150 MG 24 hr tablet Take 150 mg by mouth every  other day  12 Taking  . Cholecalciferol (VITAMIN D3) 1000 UNITS CAPS Take 1,000 Units by mouth daily.   Taking  . Cyanocobalamin (VITAMIN B-12 PO) Take 5,000 mcg by mouth.   Taking  . cycloSPORINE (RESTASIS) 0.05 % ophthalmic emulsion Place 1 drop into both eyes 2 (two) times daily.     Taking  . fexofenadine (ALLEGRA) 180 MG tablet Take 180 mg by mouth daily.     . fluticasone (CUTIVATE) 0.05 % cream Apply 1 application topically 2 (two) times daily.   2 Taking  . furosemide (LASIX) 20 MG tablet Take 2 tablets (40 mg total) by mouth daily. 180 tablet 3 Taking  . Glucosamine-Chondroit-Vit C-Mn (GLUCOSAMINE 1500 COMPLEX PO) Take 2 capsules by mouth daily.   Taking  . KLOR-CON M10 10 MEQ tablet TAKE 1 TABLET (10 MEQ TOTAL) BY MOUTH DAILY. 30 tablet 6 Taking  . levothyroxine (SYNTHROID, LEVOTHROID) 137 MCG tablet Take 137 mcg by mouth daily before breakfast.     . LUTEIN PO Take 1 tablet by mouth daily.     . Melatonin 5 MG CAPS Take 5 mg by mouth at bedtime.    Taking  . Misc Natural Products (PROSTATE HEALTH) CAPS Take 1 capsule by mouth daily.     . Multiple Vitamins-Minerals (SENIOR MULTIVITAMIN PLUS PO) Take 1 tablet by mouth daily.    Taking  . naproxen sodium (ALEVE) 220 MG tablet Take 440 mg by mouth 2 (two) times daily as needed (pain).     . nitroGLYCERIN (NITROLINGUAL) 0.4 MG/SPRAY spray Place 1 spray under the tongue every 5 (five) minutes x 3 doses as needed for chest pain. Use as directed as needed 12 g 12 Taking  . NONFORMULARY OR COMPOUNDED ITEM Allergy Vaccine 1:10 Given at [facility]   Taking  . Omega-3 1000 MG CAPS Take 1000 mg by mouth once daily   Taking  . pantoprazole (PROTONIX) 40 MG tablet Take 30-60 min before first meal of the day (Patient taking differently: Take 40 mg once daily 30-60 min before first meal of the day)   Taking  . sildenafil (VIAGRA) 100 MG tablet Take 100 mg by mouth daily as needed for erectile dysfunction.   Taking  . vitamin C (ASCORBIC ACID)  500 MG tablet Take 1,000 mg by mouth daily.      No current facility-administered medications for this visit.    No Known Allergies  Social History   Tobacco Use  . Smoking status: Former Smoker    Packs/day: 3.00    Years: 33.00    Pack years: 99.00    Types: Pipe, Cigars    Last attempt to quit: 04/28/1985    Years since quitting: 32.1  . Smokeless tobacco: Never Used  Substance Use Topics  . Alcohol use: Yes    Alcohol/week: 1.2 oz    Types: 1 Glasses of wine, 1 Shots of liquor per week    Family History  Problem Relation Age of Onset  . Lung cancer Father   . Dementia Mother   . COPD Mother      Review of Systems  Constitutional:  Negative.   HENT: Negative.   Eyes: Negative.   Respiratory: Negative.   Cardiovascular: Negative.   Gastrointestinal: Negative.   Genitourinary: Negative.   Musculoskeletal: Positive for joint pain.  Skin: Positive for rash.  Neurological: Negative.   Endo/Heme/Allergies: Negative.   Psychiatric/Behavioral: Negative.     Objective:  Physical Exam  Vitals reviewed. Constitutional: He is oriented to person, place, and time. He appears well-developed and well-nourished.  HENT:  Head: Normocephalic and atraumatic.  Eyes: Conjunctivae and EOM are normal. Pupils are equal, round, and reactive to light.  Neck: Normal range of motion. Neck supple.  Cardiovascular: Normal rate, regular rhythm and intact distal pulses.  Respiratory: Effort normal. No respiratory distress.  GI: Soft. He exhibits no distension.  Genitourinary:  Genitourinary Comments: deferred  Musculoskeletal:       Right hip: He exhibits decreased range of motion, decreased strength and bony tenderness.  Neurological: He is alert and oriented to person, place, and time. He has normal reflexes.  Skin: Skin is warm and dry.  Psychiatric: He has a normal mood and affect. His behavior is normal. Judgment and thought content normal.    Vital signs in last 24  hours: @VSRANGES @  Labs:   Estimated body mass index is 35.22 kg/m as calculated from the following:   Height as of 05/19/17: 5' 10.5" (1.791 m).   Weight as of 05/19/17: 112.9 kg (249 lb).   Imaging Review Plain radiographs demonstrate severe degenerative joint disease of the right hip(s). The bone quality appears to be adequate for age and reported activity level.  Assessment/Plan:  End stage arthritis, right hip(s)  The patient history, physical examination, clinical judgement of the provider and imaging studies are consistent with end stage degenerative joint disease of the right hip(s) and total hip arthroplasty is deemed medically necessary. The treatment options including medical management, injection therapy, arthroscopy and arthroplasty were discussed at length. The risks and benefits of total hip arthroplasty were presented and reviewed. The risks due to aseptic loosening, infection, stiffness, dislocation/subluxation,  thromboembolic complications and other imponderables were discussed.  The patient acknowledged the explanation, agreed to proceed with the plan and consent was signed. Patient is being admitted for inpatient treatment for surgery, pain control, PT, OT, prophylactic antibiotics, VTE prophylaxis, progressive ambulation and ADL's and discharge planning.The patient is planning to be discharged home with HEP

## 2017-06-02 NOTE — H&P (View-Only) (Signed)
TOTAL HIP ADMISSION H&P  Patient is admitted for right total hip arthroplasty.  Subjective:  Chief Complaint: right hip pain  HPI: [name], 77 y.o. male, has a history of pain and functional disability in the right hip(s) due to arthritis and patient has failed non-surgical conservative treatments for greater than 12 weeks to include NSAID's and/or analgesics, flexibility and strengthening excercises, supervised PT with diminished ADL's post treatment, use of assistive devices, weight reduction as appropriate and activity modification.  Onset of symptoms was gradual starting 1 years ago with rapidlly worsening course since that time.The patient noted no past surgery on the right hip(s).  Patient currently rates pain in the right hip at 10 out of 10 with activity. Patient has night pain, worsening of pain with activity and weight bearing, trendelenberg gait, pain that interfers with activities of daily living and pain with passive range of motion. Patient has evidence of subchondral cysts, subchondral sclerosis, periarticular osteophytes and joint space narrowing by imaging studies. This condition presents safety issues increasing the risk of falls.   There is no current active infection.  Patient Active Problem List   Diagnosis Date Noted  . Pre-operative clearance 05/19/2017  . Weight gain 03/30/2015  . Constipation 12/28/2014  . Bilateral lower extremity edema 12/28/2014  . RBBB 08/14/2014  . Psoriasis 08/14/2014  . Chest pain with high risk of acute coronary syndrome 08/14/2014  . Essential hypertension 08/14/2014  . Unstable angina ([place]) 08/14/2014  . Hoarseness 06/15/2014  . Rash and nonspecific skin eruption 06/15/2014  . Submandibular swelling 05/15/2013  . Asthma with acute exacerbation 08/18/2012  . Eustachian tube dysfunction 04/05/2012  . Allergic rhinitis due to pollen 06/28/2010  . ABDOMINAL BRUIT 03/04/2010  . Dyslipidemia 03/07/2009  . CAD S/P PCI LAD 1987, `1997,  08/15/14 PCI pLAD DES 03/07/2009  . BPH (benign prostatic hyperplasia) 03/07/2009  . Hypothyroidism 03/06/2009  . Allergic-infective asthma 04/06/2007  . Centrilobular emphysema ([place]) 04/06/2007   Past Medical History:  Diagnosis Date  . Allergic asthma   . Barrett's esophagus   . BPH (benign prostatic hyperplasia)   . CAD (coronary artery disease)   . COPD (chronic obstructive pulmonary disease) ([place])   . GERD (gastroesophageal reflux disease)   . Hiatal hernia   . Hyperlipidemia   . Hypothyroidism   . Psoriasis     Past Surgical History:  Procedure Laterality Date  . CORONARY ANGIOPLASTY WITH STENT PLACEMENT  08/15/14   PCI of pLAD Xience alpine DES  . LEFT HEART CATHETERIZATION WITH CORONARY ANGIOGRAM N/A 08/15/2014   Procedure: LEFT HEART CATHETERIZATION WITH CORONARY ANGIOGRAM;  Surgeon: Peter M Martinique, MD;  Location: [facility] CATH LAB;  Service: Cardiovascular;  Laterality: N/A;  . left knee arthroscopy    . NASAL POLYP EXCISION  1953  . NASAL SINUS SURGERY    . TONSILLECTOMY      Current Outpatient Medications  Medication Sig Dispense Refill Last Dose  . albuterol (PROVENTIL HFA;VENTOLIN HFA) 108 (90 BASE) MCG/ACT inhaler Inhale 1-2 puffs into the lungs every 6 (six) hours as needed for wheezing or shortness of breath. (Patient not taking: Reported on 06/02/2017) 1 Inhaler 11 Not Taking at Unknown time  . amLODipine (NORVASC) 5 MG tablet Take 1 tablet (5 mg total) by mouth daily. 90 tablet 3 Taking  . aspirin EC 81 MG tablet Take 81 mg by mouth daily.    Taking  . atorvastatin (LIPITOR) 40 MG tablet Take 40 mg by mouth every evening.     Marland Kitchen  atorvastatin (LIPITOR) 80 MG tablet TAKE 1 TABLET (80 MG TOTAL) BY MOUTH DAILY. (Patient not taking: Reported on 06/02/2017) 90 tablet 3 Not Taking at Unknown time  . betamethasone dipropionate (DIPROLENE) 0.05 % cream APPLY TO AFFECTED AREA ON SKIN TWICE A DAY  3 Taking  . buPROPion (WELLBUTRIN XL) 150 MG 24 hr tablet Take 150 mg by mouth every  other day  12 Taking  . Cholecalciferol (VITAMIN D3) 1000 UNITS CAPS Take 1,000 Units by mouth daily.   Taking  . Cyanocobalamin (VITAMIN B-12 PO) Take 5,000 mcg by mouth.   Taking  . cycloSPORINE (RESTASIS) 0.05 % ophthalmic emulsion Place 1 drop into both eyes 2 (two) times daily.     Taking  . fexofenadine (ALLEGRA) 180 MG tablet Take 180 mg by mouth daily.     . fluticasone (CUTIVATE) 0.05 % cream Apply 1 application topically 2 (two) times daily.   2 Taking  . furosemide (LASIX) 20 MG tablet Take 2 tablets (40 mg total) by mouth daily. 180 tablet 3 Taking  . Glucosamine-Chondroit-Vit C-Mn (GLUCOSAMINE 1500 COMPLEX PO) Take 2 capsules by mouth daily.   Taking  . KLOR-CON M10 10 MEQ tablet TAKE 1 TABLET (10 MEQ TOTAL) BY MOUTH DAILY. 30 tablet 6 Taking  . levothyroxine (SYNTHROID, LEVOTHROID) 137 MCG tablet Take 137 mcg by mouth daily before breakfast.     . LUTEIN PO Take 1 tablet by mouth daily.     . Melatonin 5 MG CAPS Take 5 mg by mouth at bedtime.    Taking  . Misc Natural Products (PROSTATE HEALTH) CAPS Take 1 capsule by mouth daily.     . Multiple Vitamins-Minerals (SENIOR MULTIVITAMIN PLUS PO) Take 1 tablet by mouth daily.    Taking  . naproxen sodium (ALEVE) 220 MG tablet Take 440 mg by mouth 2 (two) times daily as needed (pain).     . nitroGLYCERIN (NITROLINGUAL) 0.4 MG/SPRAY spray Place 1 spray under the tongue every 5 (five) minutes x 3 doses as needed for chest pain. Use as directed as needed 12 g 12 Taking  . NONFORMULARY OR COMPOUNDED ITEM Allergy Vaccine 1:10 Given at [facility]   Taking  . Omega-3 1000 MG CAPS Take 1000 mg by mouth once daily   Taking  . pantoprazole (PROTONIX) 40 MG tablet Take 30-60 min before first meal of the day (Patient taking differently: Take 40 mg once daily 30-60 min before first meal of the day)   Taking  . sildenafil (VIAGRA) 100 MG tablet Take 100 mg by mouth daily as needed for erectile dysfunction.   Taking  . vitamin C (ASCORBIC ACID)  500 MG tablet Take 1,000 mg by mouth daily.      No current facility-administered medications for this visit.    No Known Allergies  Social History   Tobacco Use  . Smoking status: Former Smoker    Packs/day: 3.00    Years: 33.00    Pack years: 99.00    Types: Pipe, Cigars    Last attempt to quit: 04/28/1985    Years since quitting: 32.1  . Smokeless tobacco: Never Used  Substance Use Topics  . Alcohol use: Yes    Alcohol/week: 1.2 oz    Types: 1 Glasses of wine, 1 Shots of liquor per week    Family History  Problem Relation Age of Onset  . Lung cancer Father   . Dementia Mother   . COPD Mother      Review of Systems  Constitutional:  Negative.   HENT: Negative.   Eyes: Negative.   Respiratory: Negative.   Cardiovascular: Negative.   Gastrointestinal: Negative.   Genitourinary: Negative.   Musculoskeletal: Positive for joint pain.  Skin: Positive for rash.  Neurological: Negative.   Endo/Heme/Allergies: Negative.   Psychiatric/Behavioral: Negative.     Objective:  Physical Exam  Vitals reviewed. Constitutional: He is oriented to person, place, and time. He appears well-developed and well-nourished.  HENT:  Head: Normocephalic and atraumatic.  Eyes: Conjunctivae and EOM are normal. Pupils are equal, round, and reactive to light.  Neck: Normal range of motion. Neck supple.  Cardiovascular: Normal rate, regular rhythm and intact distal pulses.  Respiratory: Effort normal. No respiratory distress.  GI: Soft. He exhibits no distension.  Genitourinary:  Genitourinary Comments: deferred  Musculoskeletal:       Right hip: He exhibits decreased range of motion, decreased strength and bony tenderness.  Neurological: He is alert and oriented to person, place, and time. He has normal reflexes.  Skin: Skin is warm and dry.  Psychiatric: He has a normal mood and affect. His behavior is normal. Judgment and thought content normal.    Vital signs in last 24  hours: @VSRANGES @  Labs:   Estimated body mass index is 35.22 kg/m as calculated from the following:   Height as of 05/19/17: 5' 10.5" (1.791 m).   Weight as of 05/19/17: 112.9 kg (249 lb).   Imaging Review Plain radiographs demonstrate severe degenerative joint disease of the right hip(s). The bone quality appears to be adequate for age and reported activity level.  Assessment/Plan:  End stage arthritis, right hip(s)  The patient history, physical examination, clinical judgement of the provider and imaging studies are consistent with end stage degenerative joint disease of the right hip(s) and total hip arthroplasty is deemed medically necessary. The treatment options including medical management, injection therapy, arthroscopy and arthroplasty were discussed at length. The risks and benefits of total hip arthroplasty were presented and reviewed. The risks due to aseptic loosening, infection, stiffness, dislocation/subluxation,  thromboembolic complications and other imponderables were discussed.  The patient acknowledged the explanation, agreed to proceed with the plan and consent was signed. Patient is being admitted for inpatient treatment for surgery, pain control, PT, OT, prophylactic antibiotics, VTE prophylaxis, progressive ambulation and ADL's and discharge planning.The patient is planning to be discharged home with HEP

## 2017-06-09 NOTE — Pre-Procedure Instructions (Signed)
YONAEL TULLOCH  06/09/2017      [id number] [address] [place] [address] [place] [address] Phone: [phone] Fax: [phone]    Your procedure is scheduled on February 25  Report to [facility] at 0830 A.M.  Call this number if you have problems the morning of surgery:  [phone]   Remember:  Do not eat food or drink liquids after midnight.  Continue all medications as directed by your physician except follow these medication instructions before surgery below   Take these medicines the morning of surgery with A SIP OF WATER  amLODipine (NORVASC) buPROPion (WELLBUTRIN XL)  fexofenadine (ALLEGRA) levothyroxine (SYNTHROID, LEVOTHROID) pantoprazole (PROTONIX)   7 days prior to surgery STOP taking any Aspirin(unless otherwise instructed by your surgeon), Aleve, Naproxen, Ibuprofen, Motrin, Advil, Goody's, BC's, all herbal medications, fish oil, and all vitamins  Follow your doctors instructions regarding your Aspirin.  If no instructions were given by your doctor, then you will need to call the prescribing office office to get instructions.      Do not wear jewelry  Do not wear lotions, powders, or cologne, or deodorant.  Men may shave face and neck.  Do not bring valuables to the hospital.  [facility] is not responsible for any belongings or valuables.  Contacts, dentures or bridgework may not be worn into surgery.  Leave your suitcase in the car.  After surgery it may be brought to your room.  For patients admitted to the hospital, discharge time will be determined by your treatment team.  Patients discharged the day of surgery will not be allowed to drive home.    Special instructions:   [facility]- Preparing For Surgery  Before surgery, you can play an important role. Because skin is not sterile, your skin needs to be as free of germs as possible. You can reduce the number of germs on your  skin by washing with CHG (chlorahexidine gluconate) Soap before surgery.  CHG is an antiseptic cleaner which kills germs and bonds with the skin to continue killing germs even after washing.  Please do not use if you have an allergy to CHG or antibacterial soaps. If your skin becomes reddened/irritated stop using the CHG.  Do not shave (including legs and underarms) for at least 48 hours prior to first CHG shower. It is OK to shave your face.  Please follow these instructions carefully.   1. Shower the NIGHT BEFORE SURGERY and the MORNING OF SURGERY with CHG.   2. If you chose to wash your hair, wash your hair first as usual with your normal shampoo.  3. After you shampoo, rinse your hair and body thoroughly to remove the shampoo.  4. Use CHG as you would any other liquid soap. You can apply CHG directly to the skin and wash gently with a scrungie or a clean washcloth.   5. Apply the CHG Soap to your body ONLY FROM THE NECK DOWN.  Do not use on open wounds or open sores. Avoid contact with your eyes, ears, mouth and genitals (private parts). Wash Face and genitals (private parts)  with your normal soap.  6. Wash thoroughly, paying special attention to the area where your surgery will be performed.  7. Thoroughly rinse your body with warm water from the neck down.  8. DO NOT shower/wash with your normal soap after using and rinsing off the CHG Soap.  9. Pat yourself dry  with a CLEAN TOWEL.  10. Wear CLEAN PAJAMAS to bed the night before surgery, wear comfortable clothes the morning of surgery  11. Place CLEAN SHEETS on your bed the night of your first shower and DO NOT SLEEP WITH PETS.    Day of Surgery: Do not apply any deodorants/lotions. Please wear clean clothes to the hospital/surgery center.      Please read over the following fact sheets that you were given.

## 2017-06-10 ENCOUNTER — Encounter (HOSPITAL_COMMUNITY): Payer: Self-pay

## 2017-06-10 ENCOUNTER — Other Ambulatory Visit: Payer: Self-pay

## 2017-06-10 ENCOUNTER — Encounter (HOSPITAL_COMMUNITY)
Admission: RE | Admit: 2017-06-10 | Discharge: 2017-06-10 | Disposition: A | Discharge status: Home / Self Care | Payer: Medicare PPO | Source: Ambulatory Visit | Attending: Orthopedic Surgery | Admitting: Orthopedic Surgery

## 2017-06-10 DIAGNOSIS — I2510 Atherosclerotic heart disease of native coronary artery without angina pectoris: Secondary | ICD-10-CM | POA: Diagnosis not present

## 2017-06-10 DIAGNOSIS — E785 Hyperlipidemia, unspecified: Secondary | ICD-10-CM | POA: Insufficient documentation

## 2017-06-10 DIAGNOSIS — Z01812 Encounter for preprocedural laboratory examination: Secondary | ICD-10-CM | POA: Diagnosis not present

## 2017-06-10 DIAGNOSIS — E039 Hypothyroidism, unspecified: Secondary | ICD-10-CM | POA: Diagnosis not present

## 2017-06-10 DIAGNOSIS — I252 Old myocardial infarction: Secondary | ICD-10-CM | POA: Insufficient documentation

## 2017-06-10 DIAGNOSIS — K219 Gastro-esophageal reflux disease without esophagitis: Secondary | ICD-10-CM | POA: Diagnosis not present

## 2017-06-10 DIAGNOSIS — M1611 Unilateral primary osteoarthritis, right hip: Secondary | ICD-10-CM | POA: Insufficient documentation

## 2017-06-10 DIAGNOSIS — G4733 Obstructive sleep apnea (adult) (pediatric): Secondary | ICD-10-CM | POA: Insufficient documentation

## 2017-06-10 DIAGNOSIS — J449 Chronic obstructive pulmonary disease, unspecified: Secondary | ICD-10-CM | POA: Insufficient documentation

## 2017-06-10 DIAGNOSIS — Z6836 Body mass index (BMI) 36.0-36.9, adult: Secondary | ICD-10-CM | POA: Diagnosis not present

## 2017-06-10 DIAGNOSIS — Z79899 Other long term (current) drug therapy: Secondary | ICD-10-CM | POA: Insufficient documentation

## 2017-06-10 DIAGNOSIS — E669 Obesity, unspecified: Secondary | ICD-10-CM | POA: Diagnosis not present

## 2017-06-10 DIAGNOSIS — Z7982 Long term (current) use of aspirin: Secondary | ICD-10-CM | POA: Diagnosis not present

## 2017-06-10 DIAGNOSIS — Z87891 Personal history of nicotine dependence: Secondary | ICD-10-CM | POA: Diagnosis not present

## 2017-06-10 HISTORY — DX: Sleep apnea, unspecified: G47.30

## 2017-06-10 LAB — BASIC METABOLIC PANEL
ANION GAP: 9 (ref 5–15)
BUN: 12 mg/dL (ref 6–20)
CO2: 25 mmol/L (ref 22–32)
Calcium: 9.1 mg/dL (ref 8.9–10.3)
Chloride: 106 mmol/L (ref 101–111)
Creatinine, Ser: 0.77 mg/dL (ref 0.61–1.24)
GFR calc Af Amer: >60 mL/min (ref >60)
GFR calc non Af Amer: >60 mL/min (ref >60)
GLUCOSE: 119 mg/dL — AB (ref 65–99)
Potassium: 4 mmol/L (ref 3.5–5.1)
Sodium: 140 mmol/L (ref 135–145)

## 2017-06-10 LAB — SURGICAL PCR SCREEN
MRSA, PCR: NEGATIVE
Staphylococcus aureus: NEGATIVE

## 2017-06-10 LAB — CBC
HEMATOCRIT: 42.7 % (ref 39.0–52.0)
HEMOGLOBIN: 14.2 g/dL (ref 13.0–17.0)
MCH: 33 pg (ref 26.0–34.0)
MCHC: 33.3 g/dL (ref 30.0–36.0)
MCV: 99.3 fL (ref 78.0–100.0)
Platelets: 157 10*3/uL (ref 150–400)
RBC: 4.3 MIL/uL (ref 4.22–5.81)
RDW: 13.6 % (ref 11.5–15.5)
WBC: 4.6 10*3/uL (ref 4.0–10.5)

## 2017-06-10 NOTE — Progress Notes (Addendum)
PCP: Deland Pretty, MD  [place], MD  EKG: 05/19/17 in [facility]  Stress test: 04/07/11 in EP[facility]  ECHO: 11/21/15 and 09/30/16 in EP[facility]  Cardiac Cath: 08/15/14 in [facility]  Chest x-ray: pt denies past year

## 2017-06-11 LAB — TYPE AND SCREEN
ABO/RH(D): O NEG
Antibody Screen: NEGATIVE
WEAK D: POSITIVE

## 2017-06-11 NOTE — Progress Notes (Addendum)
Anesthesia Chart Review: Patient is a 77 year old male scheduled for right THA on 06/22/17 by Dr. Rod Can.   History includes former smoker (quit '87), CAD (MI '87 s/p PCI LAD, PCI LAD '97, DES LAD 08/15/14), HLD, GERD, hiatal hernia, GERD, Barrett's esophagus, hypothyroidism, COPD, psoriasis, BPH, OSA (CPAP), allergic asthma, tonsillectomy, nasal sinus surgery, left TKA 06/14/10. BMI is consistent with obesity.   - PCP is Dr. Deland Pretty. Dr. Sid Falcon office reported he cleared patient from a medical standpoint.  - Cardiologist is Dr. Kirk Ruths. Last visit with Dario Guardian, PA-C on 05/19/17. He was cleared for surgery. (Update: Patient had routine follow-up with Dr. Stanford Breed on 06/16/17. He also felt patient may proceed with surgery without further cardiac evaluation.)  Meds include albuterol, amlodipine, ASA 81 mg (discontinue 5 days prior to surgery per Dr. Shelia Media), Lipitor, Wellbutrin XL, Allegra, Lasix, KCl, levothyroxine, Lutein, melatonin, nitro spray, omega 3, Protonix, Viagra.   BP (!) 142/74   Pulse 93   Temp 36.8 C   Resp 20   Ht 5' 10.5" (1.791 m)   Wt 255 lb (115.7 kg)   SpO2 96%   BMI 36.07 kg/m   EKG 05/19/17: SR with first degree AV block, LAD, right BBB, septal infarct (age undetermined).  Echo 09/30/16: Study Conclusions - Left ventricle: The cavity size was normal. Systolic function was   at the lower limits of normal. The estimated ejection fraction   was in the range of 50% to 55%. Mild diffuse hypokinesis. Doppler   parameters are consistent with abnormal left ventricular   relaxation (grade 1 diastolic dysfunction). - Mitral valve: Calcified annulus. - Left atrium: The atrium was moderately dilated.  Cardiac cath 08/15/14:  Dominance: Co-dominant  Left Main: Large caliber vessel, bifurcates into LAD and Circumflex.  Angiographically normal. LAD: Large caliber vessel with ~80% stenosis in the transition from proximal to mid LAD (involving the takeoff of  2 small diagonal branches and septal perforators.  Following the initial significant lesion, there is diffuse ~40-50% stenosis in the more proximal stent (In-stent stenosis). The marked BMS in the mid LAD is widely patent.  Beyond the previous stented segment, the vessel gives off a major diagonal branch.  Distally, the vessel barely reaches the apex.  D1 & 2: small caliber proximal branches, ostial involvement with LAD lesion. Too small for PCI  D3: moderate caliber vessel, luminal irregularities. Left Circumflex: Large caliber, co-dominant vessel that bifurcates into a large OM1 and the distal AV Groove that bifurcates into 2 LPL branches.  OM1: Large caliber vessel that bifurcates into OM1 and OM2.  Prior to the bifurcation there is a short eccentric ~20%. OM1 & 2 are moderate caliber tortuous vessels with no significant disease.  LPL System: 2 small to moderate caliber LPL branches with no significant disease  RCA: Large caliber, co-dominant vessel with proximal ~20% stenosis.  The vessel terminates as a large Posterior Descending Artery with minimal luminal irregularities. POST-OPERATIVE DIAGNOSIS:    Severe single vessel CAD with ~proximal LAD ~80% stenosis just proximal to 2 BMS stents.    Successful PCI of proximal LAD - Xience Alpine DES 3.0 mm x 28 mm -- post-dilated in tapered fashion to 4.1 mm prox & 3.8 mm distal (@ stent overlap)  Relatively preserved LVEF with normal LVEDP.  PLAN OF CARE:  May have post PCI angina from jailed Diagonal branches - treat with nitrates.  DAPT x minimum of 1 yr, can switch to Plavix if needed.  Risk Factor  Treatment.  Abdominal aortic U/S 05/13/12: Normal caliber abdominal aorta, common and external iliac arteries, without focal stenosis, or dilatation.   Spirometry 08/18/12: FVC 3.24 (70%), FEV1 2.26 (65%). Mild restriction.   Preoperative labs noted.   If no acute changes then I anticipate that he can proceed as planned.  George Hugh [facility] Short Stay Center/Anesthesiology Phone [phone] 06/11/2017 3:05 PM

## 2017-06-16 ENCOUNTER — Ambulatory Visit (INDEPENDENT_AMBULATORY_CARE_PROVIDER_SITE_OTHER): Payer: Medicare PPO | Admitting: Cardiology

## 2017-06-16 ENCOUNTER — Encounter: Payer: Self-pay | Admitting: Cardiology

## 2017-06-16 VITALS — BP 118/70 | HR 80 | Ht 70.5 in | Wt 251.0 lb

## 2017-06-16 DIAGNOSIS — I10 Essential (primary) hypertension: Secondary | ICD-10-CM

## 2017-06-16 DIAGNOSIS — E7800 Pure hypercholesterolemia, unspecified: Secondary | ICD-10-CM

## 2017-06-16 DIAGNOSIS — I2510 Atherosclerotic heart disease of native coronary artery without angina pectoris: Secondary | ICD-10-CM

## 2017-06-16 NOTE — Patient Instructions (Signed)
Your physician wants you to follow-up in: 6 MONTHS WITH DR CRENSHAW You will receive a reminder letter in the mail two months in advance. If you don't receive a letter, please call our office to schedule the follow-up appointment.   If you need a refill on your cardiac medications before your next appointment, please call your pharmacy.  

## 2017-06-19 MED ORDER — TRANEXAMIC ACID 1000 MG/10ML IV SOLN
1000.0000 mg | INTRAVENOUS | Status: AC
  Administered 2017-06-22: 1000 mg via INTRAVENOUS
  Filled 2017-06-19: qty 1100

## 2017-06-19 MED ORDER — DEXTROSE 5 % IV SOLN
3.0000 g | INTRAVENOUS | Status: AC
  Administered 2017-06-22: 3 g via INTRAVENOUS
  Filled 2017-06-19: qty 3

## 2017-06-22 ENCOUNTER — Inpatient Hospital Stay (HOSPITAL_COMMUNITY)
Admission: RE | Admit: 2017-06-22 | Discharge: 2017-06-23 | DRG: 470 | Disposition: A | Discharge status: Home / Self Care | Payer: Medicare PPO | Attending: Orthopedic Surgery | Admitting: Orthopedic Surgery

## 2017-06-22 ENCOUNTER — Encounter (HOSPITAL_COMMUNITY): Payer: Self-pay | Admitting: *Deleted

## 2017-06-22 ENCOUNTER — Encounter (HOSPITAL_COMMUNITY)
Admission: RE | Disposition: A | Discharge status: Home / Self Care | Payer: Self-pay | Source: Home / Self Care | Attending: Orthopedic Surgery

## 2017-06-22 ENCOUNTER — Inpatient Hospital Stay (HOSPITAL_COMMUNITY): Payer: Medicare PPO

## 2017-06-22 ENCOUNTER — Inpatient Hospital Stay (HOSPITAL_COMMUNITY): Payer: Medicare PPO | Admitting: Anesthesiology

## 2017-06-22 ENCOUNTER — Inpatient Hospital Stay (HOSPITAL_COMMUNITY): Payer: Medicare PPO | Admitting: Vascular Surgery

## 2017-06-22 ENCOUNTER — Other Ambulatory Visit: Payer: Self-pay

## 2017-06-22 DIAGNOSIS — J449 Chronic obstructive pulmonary disease, unspecified: Secondary | ICD-10-CM | POA: Diagnosis present

## 2017-06-22 DIAGNOSIS — Z955 Presence of coronary angioplasty implant and graft: Secondary | ICD-10-CM | POA: Diagnosis not present

## 2017-06-22 DIAGNOSIS — I2510 Atherosclerotic heart disease of native coronary artery without angina pectoris: Secondary | ICD-10-CM | POA: Diagnosis present

## 2017-06-22 DIAGNOSIS — Z79890 Hormone replacement therapy (postmenopausal): Secondary | ICD-10-CM

## 2017-06-22 DIAGNOSIS — I10 Essential (primary) hypertension: Secondary | ICD-10-CM | POA: Diagnosis present

## 2017-06-22 DIAGNOSIS — E785 Hyperlipidemia, unspecified: Secondary | ICD-10-CM | POA: Diagnosis present

## 2017-06-22 DIAGNOSIS — E039 Hypothyroidism, unspecified: Secondary | ICD-10-CM | POA: Diagnosis present

## 2017-06-22 DIAGNOSIS — Z79899 Other long term (current) drug therapy: Secondary | ICD-10-CM

## 2017-06-22 DIAGNOSIS — G4730 Sleep apnea, unspecified: Secondary | ICD-10-CM | POA: Diagnosis present

## 2017-06-22 DIAGNOSIS — M1611 Unilateral primary osteoarthritis, right hip: Principal | ICD-10-CM | POA: Diagnosis present

## 2017-06-22 DIAGNOSIS — Z87891 Personal history of nicotine dependence: Secondary | ICD-10-CM | POA: Diagnosis not present

## 2017-06-22 DIAGNOSIS — L409 Psoriasis, unspecified: Secondary | ICD-10-CM | POA: Diagnosis present

## 2017-06-22 DIAGNOSIS — M25751 Osteophyte, right hip: Secondary | ICD-10-CM | POA: Diagnosis present

## 2017-06-22 DIAGNOSIS — K219 Gastro-esophageal reflux disease without esophagitis: Secondary | ICD-10-CM | POA: Diagnosis present

## 2017-06-22 DIAGNOSIS — Z419 Encounter for procedure for purposes other than remedying health state, unspecified: Secondary | ICD-10-CM

## 2017-06-22 DIAGNOSIS — N400 Enlarged prostate without lower urinary tract symptoms: Secondary | ICD-10-CM | POA: Diagnosis present

## 2017-06-22 DIAGNOSIS — K2270 Barrett's esophagus without dysplasia: Secondary | ICD-10-CM | POA: Diagnosis present

## 2017-06-22 DIAGNOSIS — Z7982 Long term (current) use of aspirin: Secondary | ICD-10-CM

## 2017-06-22 DIAGNOSIS — Z09 Encounter for follow-up examination after completed treatment for conditions other than malignant neoplasm: Secondary | ICD-10-CM

## 2017-06-22 DIAGNOSIS — M25551 Pain in right hip: Secondary | ICD-10-CM | POA: Diagnosis present

## 2017-06-22 DIAGNOSIS — Z825 Family history of asthma and other chronic lower respiratory diseases: Secondary | ICD-10-CM | POA: Diagnosis not present

## 2017-06-22 HISTORY — PX: TOTAL HIP ARTHROPLASTY: SHX124

## 2017-06-22 SURGERY — ARTHROPLASTY, HIP, TOTAL, ANTERIOR APPROACH
Anesthesia: Spinal | Site: Hip | Laterality: Right

## 2017-06-22 MED ORDER — CHLORHEXIDINE GLUCONATE 4 % EX LIQD: 60.0000 mL | Freq: Once | CUTANEOUS | Status: DC

## 2017-06-22 MED ORDER — ONDANSETRON HCL 4 MG/2ML IJ SOLN
INTRAMUSCULAR | Status: AC
Start: 2017-06-22 — End: ?
  Filled 2017-06-22: qty 2

## 2017-06-22 MED ORDER — DIPHENHYDRAMINE HCL 12.5 MG/5ML PO ELIX: 12.5000 mg | ORAL_SOLUTION | ORAL | Status: DC | PRN

## 2017-06-22 MED ORDER — ONDANSETRON HCL 4 MG PO TABS: 4.0000 mg | ORAL_TABLET | Freq: Four times a day (QID) | ORAL | Status: DC | PRN

## 2017-06-22 MED ORDER — FENTANYL CITRATE (PF) 100 MCG/2ML IJ SOLN
INTRAMUSCULAR | Status: DC | PRN
  Administered 2017-06-22: 50 ug via INTRAVENOUS

## 2017-06-22 MED ORDER — CYCLOSPORINE 0.05 % OP EMUL
1.0000 [drp] | Freq: Two times a day (BID) | OPHTHALMIC | Status: DC
  Administered 2017-06-22 – 2017-06-23 (×2): 1 [drp] via OPHTHALMIC
  Filled 2017-06-22 (×2): qty 1

## 2017-06-22 MED ORDER — ACETAMINOPHEN 650 MG RE SUPP: 650.0000 mg | RECTAL | Status: DC | PRN

## 2017-06-22 MED ORDER — DEXAMETHASONE SODIUM PHOSPHATE 10 MG/ML IJ SOLN
INTRAMUSCULAR | Status: DC | PRN
  Administered 2017-06-22: 10 mg via INTRAVENOUS

## 2017-06-22 MED ORDER — FENTANYL CITRATE (PF) 100 MCG/2ML IJ SOLN: 25.0000 ug | INTRAMUSCULAR | Status: DC | PRN

## 2017-06-22 MED ORDER — SODIUM CHLORIDE 0.9 % IV SOLN
INTRAVENOUS | Status: DC
  Administered 2017-06-22: 19:00:00 via INTRAVENOUS

## 2017-06-22 MED ORDER — PHENOL 1.4 % MT LIQD: 1.0000 | OROMUCOSAL | Status: DC | PRN

## 2017-06-22 MED ORDER — SODIUM CHLORIDE 0.9 % IJ SOLN
INTRAMUSCULAR | Status: DC | PRN
  Administered 2017-06-22: 30 mL

## 2017-06-22 MED ORDER — HYDROCODONE-ACETAMINOPHEN 5-325 MG PO TABS: 1.0000 | ORAL_TABLET | ORAL | Status: DC | PRN

## 2017-06-22 MED ORDER — PROPOFOL 10 MG/ML IV BOLUS
INTRAVENOUS | Status: AC
  Filled 2017-06-22: qty 20

## 2017-06-22 MED ORDER — STERILE WATER FOR IRRIGATION IR SOLN
Status: DC | PRN
  Administered 2017-06-22: 1000 mL

## 2017-06-22 MED ORDER — ALUM & MAG HYDROXIDE-SIMETH 200-200-20 MG/5ML PO SUSP: 30.0000 mL | ORAL | Status: DC | PRN

## 2017-06-22 MED ORDER — SENNA 8.6 MG PO TABS
2.0000 | ORAL_TABLET | Freq: Every day | ORAL | Status: DC
  Administered 2017-06-22: 17.2 mg via ORAL
  Filled 2017-06-22: qty 2

## 2017-06-22 MED ORDER — FENTANYL CITRATE (PF) 250 MCG/5ML IJ SOLN
INTRAMUSCULAR | Status: AC
  Filled 2017-06-22: qty 5

## 2017-06-22 MED ORDER — SODIUM CHLORIDE 0.9 % IV SOLN: INTRAVENOUS | Status: DC

## 2017-06-22 MED ORDER — MEPERIDINE HCL 50 MG/ML IJ SOLN: 6.2500 mg | INTRAMUSCULAR | Status: DC | PRN

## 2017-06-22 MED ORDER — LACTATED RINGERS IV SOLN
INTRAVENOUS | Status: DC
  Administered 2017-06-22 (×2): via INTRAVENOUS

## 2017-06-22 MED ORDER — FUROSEMIDE 40 MG PO TABS
40.0000 mg | ORAL_TABLET | Freq: Every day | ORAL | Status: DC
  Administered 2017-06-23: 40 mg via ORAL
  Filled 2017-06-22: qty 1

## 2017-06-22 MED ORDER — PANTOPRAZOLE SODIUM 40 MG PO TBEC
40.0000 mg | DELAYED_RELEASE_TABLET | Freq: Two times a day (BID) | ORAL | Status: DC
  Administered 2017-06-22 – 2017-06-23 (×2): 40 mg via ORAL
  Filled 2017-06-22 (×2): qty 1

## 2017-06-22 MED ORDER — LORATADINE 10 MG PO TABS
10.0000 mg | ORAL_TABLET | Freq: Every day | ORAL | Status: DC
  Administered 2017-06-23: 10 mg via ORAL
  Filled 2017-06-22: qty 1

## 2017-06-22 MED ORDER — PROPOFOL 500 MG/50ML IV EMUL
INTRAVENOUS | Status: DC | PRN
  Administered 2017-06-22: 75 ug/kg/min via INTRAVENOUS

## 2017-06-22 MED ORDER — MIDAZOLAM HCL 5 MG/5ML IJ SOLN
INTRAMUSCULAR | Status: DC | PRN
  Administered 2017-06-22: 1 mg via INTRAVENOUS

## 2017-06-22 MED ORDER — METOCLOPRAMIDE HCL 5 MG PO TABS: 5.0000 mg | ORAL_TABLET | Freq: Three times a day (TID) | ORAL | Status: DC | PRN

## 2017-06-22 MED ORDER — MIDAZOLAM HCL 2 MG/2ML IJ SOLN
INTRAMUSCULAR | Status: AC
  Filled 2017-06-22: qty 2

## 2017-06-22 MED ORDER — METHOCARBAMOL 1000 MG/10ML IJ SOLN
500.0000 mg | Freq: Four times a day (QID) | INTRAVENOUS | Status: DC | PRN
  Filled 2017-06-22: qty 5

## 2017-06-22 MED ORDER — METOCLOPRAMIDE HCL 5 MG/ML IJ SOLN: 5.0000 mg | Freq: Three times a day (TID) | INTRAMUSCULAR | Status: DC | PRN

## 2017-06-22 MED ORDER — POTASSIUM CHLORIDE CRYS ER 10 MEQ PO TBCR
10.0000 meq | EXTENDED_RELEASE_TABLET | Freq: Every day | ORAL | Status: DC
  Administered 2017-06-23: 10 meq via ORAL
  Filled 2017-06-22: qty 1

## 2017-06-22 MED ORDER — KETOROLAC TROMETHAMINE 30 MG/ML IJ SOLN
INTRAMUSCULAR | Status: AC
  Filled 2017-06-22: qty 1

## 2017-06-22 MED ORDER — SODIUM CHLORIDE 0.9 % IR SOLN
Status: DC | PRN
  Administered 2017-06-22: 1

## 2017-06-22 MED ORDER — DOCUSATE SODIUM 100 MG PO CAPS
100.0000 mg | ORAL_CAPSULE | Freq: Two times a day (BID) | ORAL | Status: DC
  Administered 2017-06-22 – 2017-06-23 (×2): 100 mg via ORAL
  Filled 2017-06-22 (×2): qty 1

## 2017-06-22 MED ORDER — HYDROMORPHONE HCL 1 MG/ML IJ SOLN: 0.5000 mg | INTRAMUSCULAR | Status: DC | PRN

## 2017-06-22 MED ORDER — BUPIVACAINE-EPINEPHRINE (PF) 0.5% -1:200000 IJ SOLN
INTRAMUSCULAR | Status: AC
  Filled 2017-06-22: qty 30

## 2017-06-22 MED ORDER — ASPIRIN 81 MG PO CHEW
81.0000 mg | CHEWABLE_TABLET | Freq: Two times a day (BID) | ORAL | Status: DC
  Administered 2017-06-22 – 2017-06-23 (×2): 81 mg via ORAL
  Filled 2017-06-22 (×2): qty 1

## 2017-06-22 MED ORDER — DEXAMETHASONE SODIUM PHOSPHATE 10 MG/ML IJ SOLN
10.0000 mg | Freq: Once | INTRAMUSCULAR | Status: AC
  Administered 2017-06-23: 10 mg via INTRAVENOUS
  Filled 2017-06-22: qty 1

## 2017-06-22 MED ORDER — ACETAMINOPHEN 325 MG PO TABS: 650.0000 mg | ORAL_TABLET | ORAL | Status: DC | PRN

## 2017-06-22 MED ORDER — CEFAZOLIN SODIUM-DEXTROSE 2-4 GM/100ML-% IV SOLN
2.0000 g | Freq: Four times a day (QID) | INTRAVENOUS | Status: AC
  Administered 2017-06-22 – 2017-06-23 (×2): 2 g via INTRAVENOUS
  Filled 2017-06-22 (×3): qty 100

## 2017-06-22 MED ORDER — POVIDONE-IODINE 10 % EX SWAB: 2.0000 "application " | Freq: Once | CUTANEOUS | Status: DC

## 2017-06-22 MED ORDER — KETOROLAC TROMETHAMINE 30 MG/ML IJ SOLN
INTRAMUSCULAR | Status: DC | PRN
  Administered 2017-06-22: 30 mg via INTRAMUSCULAR

## 2017-06-22 MED ORDER — ATORVASTATIN CALCIUM 40 MG PO TABS
40.0000 mg | ORAL_TABLET | Freq: Every evening | ORAL | Status: DC
  Administered 2017-06-22: 40 mg via ORAL
  Filled 2017-06-22: qty 1

## 2017-06-22 MED ORDER — ONDANSETRON HCL 4 MG/2ML IJ SOLN: 4.0000 mg | Freq: Four times a day (QID) | INTRAMUSCULAR | Status: DC | PRN

## 2017-06-22 MED ORDER — BUPIVACAINE IN DEXTROSE 0.75-8.25 % IT SOLN
INTRATHECAL | Status: DC | PRN
Start: 2017-06-22 — End: 2017-06-22
  Administered 2017-06-22: 1.6 mL via INTRATHECAL

## 2017-06-22 MED ORDER — NITROGLYCERIN 0.4 MG/SPRAY TL SOLN: 1.0000 | Status: DC | PRN

## 2017-06-22 MED ORDER — BUPIVACAINE-EPINEPHRINE (PF) 0.5% -1:200000 IJ SOLN
INTRAMUSCULAR | Status: DC | PRN
  Administered 2017-06-22: 30 mL via PERINEURAL

## 2017-06-22 MED ORDER — AMLODIPINE BESYLATE 5 MG PO TABS
5.0000 mg | ORAL_TABLET | Freq: Every day | ORAL | Status: DC
  Administered 2017-06-23: 5 mg via ORAL
  Filled 2017-06-22: qty 1

## 2017-06-22 MED ORDER — 0.9 % SODIUM CHLORIDE (POUR BTL) OPTIME
TOPICAL | Status: DC | PRN
  Administered 2017-06-22: 1000 mL

## 2017-06-22 MED ORDER — ACETAMINOPHEN 10 MG/ML IV SOLN
1000.0000 mg | INTRAVENOUS | Status: AC
  Administered 2017-06-22: 1000 mg via INTRAVENOUS
  Filled 2017-06-22: qty 100

## 2017-06-22 MED ORDER — KETOROLAC TROMETHAMINE 15 MG/ML IJ SOLN
7.5000 mg | Freq: Four times a day (QID) | INTRAMUSCULAR | Status: DC
  Administered 2017-06-22 – 2017-06-23 (×3): 7.5 mg via INTRAVENOUS
  Filled 2017-06-22 (×3): qty 1

## 2017-06-22 MED ORDER — METHOCARBAMOL 500 MG PO TABS
500.0000 mg | ORAL_TABLET | Freq: Four times a day (QID) | ORAL | Status: DC | PRN
  Administered 2017-06-22 – 2017-06-23 (×2): 500 mg via ORAL
  Filled 2017-06-22 (×2): qty 1

## 2017-06-22 MED ORDER — POLYETHYLENE GLYCOL 3350 17 G PO PACK: 17.0000 g | PACK | Freq: Every day | ORAL | Status: DC | PRN

## 2017-06-22 MED ORDER — DEXAMETHASONE SODIUM PHOSPHATE 10 MG/ML IJ SOLN
INTRAMUSCULAR | Status: AC
  Filled 2017-06-22: qty 1

## 2017-06-22 MED ORDER — ONDANSETRON HCL 4 MG/2ML IJ SOLN
INTRAMUSCULAR | Status: DC | PRN
  Administered 2017-06-22: 4 mg via INTRAVENOUS

## 2017-06-22 MED ORDER — LIDOCAINE 2% (20 MG/ML) 5 ML SYRINGE
INTRAMUSCULAR | Status: AC
  Filled 2017-06-22: qty 5

## 2017-06-22 MED ORDER — LEVOTHYROXINE SODIUM 25 MCG PO TABS
137.0000 ug | ORAL_TABLET | Freq: Every day | ORAL | Status: DC
  Administered 2017-06-23: 137 ug via ORAL
  Filled 2017-06-22 (×2): qty 1

## 2017-06-22 MED ORDER — MENTHOL 3 MG MT LOZG: 1.0000 | LOZENGE | OROMUCOSAL | Status: DC | PRN

## 2017-06-22 MED ORDER — PHENYLEPHRINE HCL 10 MG/ML IJ SOLN
INTRAVENOUS | Status: DC | PRN
  Administered 2017-06-22: 25 ug/min via INTRAVENOUS

## 2017-06-22 MED ORDER — HYDROCODONE-ACETAMINOPHEN 5-325 MG PO TABS
2.0000 | ORAL_TABLET | ORAL | Status: DC | PRN
  Administered 2017-06-22 (×2): 2 via ORAL
  Administered 2017-06-23: 1 via ORAL
  Administered 2017-06-23: 2 via ORAL
  Filled 2017-06-22 (×4): qty 2

## 2017-06-22 MED ORDER — METOCLOPRAMIDE HCL 5 MG/ML IJ SOLN
10.0000 mg | Freq: Once | INTRAMUSCULAR | Status: DC | PRN
Start: 2017-06-22 — End: 2017-06-22

## 2017-06-22 SURGICAL SUPPLY — 53 items
ADH SKN CLS APL DERMABOND .7 (GAUZE/BANDAGES/DRESSINGS) ×1
ALCOHOL ISOPROPYL (RUBBING) (MISCELLANEOUS) ×2 IMPLANT
BLADE CLIPPER SURG (BLADE) IMPLANT
CAPT HIP TOTAL 2 ×1 IMPLANT
CHLORAPREP W/TINT 26ML (MISCELLANEOUS) ×2 IMPLANT
COVER SURGICAL LIGHT HANDLE (MISCELLANEOUS) ×2 IMPLANT
DERMABOND ADVANCED (GAUZE/BANDAGES/DRESSINGS) ×1
DERMABOND ADVANCED .7 DNX12 (GAUZE/BANDAGES/DRESSINGS) ×1 IMPLANT
DRAPE C-ARM 42X72 X-RAY (DRAPES) ×2 IMPLANT
DRAPE STERI IOBAN 125X83 (DRAPES) ×2 IMPLANT
DRAPE U-SHAPE 47X51 STRL (DRAPES) ×6 IMPLANT
DRSG AQUACEL AG ADV 3.5X10 (GAUZE/BANDAGES/DRESSINGS) ×2 IMPLANT
ELECT BLADE 4.0 EZ CLEAN MEGAD (MISCELLANEOUS) ×2
ELECT PENCIL ROCKER SW 15FT (MISCELLANEOUS) ×2 IMPLANT
ELECT REM PT RETURN 9FT ADLT (ELECTROSURGICAL) ×2
ELECTRODE BLDE 4.0 EZ CLN MEGD (MISCELLANEOUS) ×1 IMPLANT
ELECTRODE REM PT RTRN 9FT ADLT (ELECTROSURGICAL) ×1 IMPLANT
EVACUATOR 1/8 PVC DRAIN (DRAIN) IMPLANT
GLOVE BIO SURGEON STRL SZ8.5 (GLOVE) ×4 IMPLANT
GLOVE BIOGEL PI IND STRL 8.5 (GLOVE) ×1 IMPLANT
GLOVE BIOGEL PI INDICATOR 8.5 (GLOVE) ×1
GOWN STRL REUS W/ TWL LRG LVL3 (GOWN DISPOSABLE) ×2 IMPLANT
GOWN STRL REUS W/TWL 2XL LVL3 (GOWN DISPOSABLE) ×2 IMPLANT
GOWN STRL REUS W/TWL LRG LVL3 (GOWN DISPOSABLE) ×4
HANDPIECE INTERPULSE COAX TIP (DISPOSABLE) ×2
HOOD PEEL AWAY FACE SHEILD DIS (HOOD) ×4 IMPLANT
KIT BASIN OR (CUSTOM PROCEDURE TRAY) ×2 IMPLANT
KIT ROOM TURNOVER OR (KITS) ×2 IMPLANT
MANIFOLD NEPTUNE II (INSTRUMENTS) ×2 IMPLANT
MARKER SKIN DUAL TIP RULER LAB (MISCELLANEOUS) ×4 IMPLANT
NEEDLE SPNL 18GX3.5 QUINCKE PK (NEEDLE) ×2 IMPLANT
NS IRRIG 1000ML POUR BTL (IV SOLUTION) ×2 IMPLANT
PACK TOTAL JOINT (CUSTOM PROCEDURE TRAY) ×2 IMPLANT
PACK UNIVERSAL I (CUSTOM PROCEDURE TRAY) ×2 IMPLANT
PAD ARMBOARD 7.5X6 YLW CONV (MISCELLANEOUS) ×4 IMPLANT
SAW OSC TIP CART 19.5X105X1.3 (SAW) ×2 IMPLANT
SEALER BIPOLAR AQUA 6.0 (INSTRUMENTS) ×1 IMPLANT
SET HNDPC FAN SPRY TIP SCT (DISPOSABLE) ×1 IMPLANT
SOL PREP POV-IOD 4OZ 10% (MISCELLANEOUS) ×2 IMPLANT
SUT ETHIBOND NAB CT1 #1 30IN (SUTURE) ×4 IMPLANT
SUT MNCRL AB 3-0 PS2 18 (SUTURE) ×2 IMPLANT
SUT MON AB 2-0 CT1 36 (SUTURE) ×2 IMPLANT
SUT VIC AB 1 CT1 27 (SUTURE) ×2
SUT VIC AB 1 CT1 27XBRD ANBCTR (SUTURE) ×1 IMPLANT
SUT VIC AB 2-0 CT1 27 (SUTURE) ×2
SUT VIC AB 2-0 CT1 TAPERPNT 27 (SUTURE) ×1 IMPLANT
SUT VLOC 180 0 24IN GS25 (SUTURE) ×2 IMPLANT
SYR 50ML LL SCALE MARK (SYRINGE) ×2 IMPLANT
TOWEL OR 17X24 6PK STRL BLUE (TOWEL DISPOSABLE) ×2 IMPLANT
TOWEL OR 17X26 10 PK STRL BLUE (TOWEL DISPOSABLE) ×2 IMPLANT
TRAY CATH 16FR W/PLASTIC CATH (SET/KITS/TRAYS/PACK) IMPLANT
TRAY FOLEY CATH SILVER 16FR (SET/KITS/TRAYS/PACK) IMPLANT
WATER STERILE IRR 1000ML POUR (IV SOLUTION) ×7 IMPLANT

## 2017-06-22 NOTE — Anesthesia Preprocedure Evaluation (Addendum)
Anesthesia Evaluation  Patient identified by MRN, date of birth, ID band Patient awake    Reviewed: Allergy & Precautions, NPO status , Patient's Chart, lab work & pertinent test results  Airway Mallampati: II  TM Distance: >3 FB Neck ROM: Full    Dental no notable dental hx. (+) Teeth Intact, Dental Advisory Given   Pulmonary asthma , sleep apnea and Continuous Positive Airway Pressure Ventilation , COPD, former smoker,    Pulmonary exam normal breath sounds clear to auscultation       Cardiovascular hypertension, Pt. on medications + angina + CAD and + Cardiac Stents  Normal cardiovascular exam Rhythm:Regular Rate:Normal     Neuro/Psych negative neurological ROS  negative psych ROS   GI/Hepatic Neg liver ROS, hiatal hernia, GERD  Medicated and Controlled,  Endo/Other  Hypothyroidism   Renal/GU negative Renal ROS  negative genitourinary   Musculoskeletal negative musculoskeletal ROS (+)   Abdominal   Peds negative pediatric ROS (+)  Hematology negative hematology ROS (+)   Anesthesia Other Findings   Reproductive/Obstetrics negative OB ROS                            Anesthesia Physical Anesthesia Plan  ASA: III  Anesthesia Plan: Spinal   Post-op Pain Management:    Induction:   PONV Risk Score and Plan: 1 and Ondansetron  Airway Management Planned: Simple Face Mask  Additional Equipment:   Intra-op Plan:   Post-operative Plan: Extubation in OR  Informed Consent: I have reviewed the patients History and Physical, chart, labs and discussed the procedure including the risks, benefits and alternatives for the proposed anesthesia with the patient or authorized representative who has indicated his/her understanding and acceptance.   Dental advisory given  Plan Discussed with: CRNA  Anesthesia Plan Comments:         Anesthesia Quick Evaluation

## 2017-06-22 NOTE — Anesthesia Procedure Notes (Signed)
Spinal  Patient location during procedure: OR Staffing Anesthesiologist: Arvis Miguez, MD Performed: anesthesiologist  Preanesthetic Checklist Completed: patient identified, site marked, surgical consent, pre-op evaluation, timeout performed, IV checked, risks and benefits discussed and monitors and equipment checked Spinal Block Patient position: sitting Prep: DuraPrep Patient monitoring: heart rate, continuous pulse ox and blood pressure Approach: right paramedian Location: L3-4 Injection technique: single-shot Needle Needle type: Sprotte  Needle gauge: 24 G Needle length: 9 cm Additional Notes Expiration date of kit checked and confirmed. Patient tolerated procedure well, without complications.       

## 2017-06-22 NOTE — Interval H&P Note (Signed)
History and Physical Interval Note:  06/22/2017 9:46 AM  [name]  has presented today for surgery, with the diagnosis of Degenerative joint disease right hip  The various methods of treatment have been discussed with the patient and family. After consideration of risks, benefits and other options for treatment, the patient has consented to  Procedure(s) with comments: RIGHT TOTAL HIP ARTHROPLASTY ANTERIOR APPROACH (Right) - Needs RNFA as a surgical intervention .  The patient's history has been reviewed, patient examined, no change in status, stable for surgery.  I have reviewed the patient's chart and labs.  Questions were answered to the patient's satisfaction.     Hilton Cork Jesica Goheen

## 2017-06-22 NOTE — Op Note (Signed)
OPERATIVE REPORT  SURGEON: Rod Can, MD   ASSISTANT: Ky Barban, RNFA.  PREOPERATIVE DIAGNOSIS: Right hip arthritis.   POSTOPERATIVE DIAGNOSIS: Right hip arthritis.   PROCEDURE: Right total hip arthroplasty, anterior approach.   IMPLANTS: DePuy Tri Lock stem, size 7, hi offset. DePuy Pinnacle Cup, size 60 mm. DePuy Altrx liner, size 36 by 60 mm, neutral. DePuy Biolox ceramic head ball, size 36 + 1.5 mm.  ANESTHESIA:  Regional and Spinal  ESTIMATED BLOOD LOSS:-400 mL    ANTIBIOTICS: 2 g Ancef.  DRAINS: None.  COMPLICATIONS: None.   CONDITION: PACU - hemodynamically stable.   BRIEF CLINICAL NOTE: [name] is a 77 y.o. male with a long-standing history of Right hip arthritis. After failing conservative management, the patient was indicated for total hip arthroplasty. The risks, benefits, and alternatives to the procedure were explained, and the patient elected to proceed.  PROCEDURE IN DETAIL: Surgical site was marked by myself in the pre-op holding area. Once inside the operating room, spinal anesthesia was obtained, and a foley catheter was inserted. The patient was then positioned on the Hana table. All bony prominences were well padded. The hip was prepped and draped in the normal sterile surgical fashion. A time-out was called verifying side and site of surgery. The patient received IV antibiotics within 60 minutes of beginning the procedure.  The direct anterior approach to the hip was performed through the Hueter interval. Lateral femoral circumflex vessels were treated with the Auqumantys. The anterior capsule was exposed and an inverted T capsulotomy was made.The femoral neck cut was made to the level of the templated cut. A corkscrew was placed into the head and the head was removed. The femoral head was found to have eburnated bone. The head was passed to the back table and was measured.  Acetabular exposure was achieved, and the pulvinar and  labrum were excised. Sequential reaming of the acetabulum was then performed up to a size 59 mm reamer. A 60 mm cup was then opened and impacted into place at approximately 40 degrees of abduction and 20 degrees of anteversion. The final polyethylene liner was impacted into place and acetabular osteophytes were removed.   I then gained femoral exposure taking care to protect the abductors and greater trochanter. This was performed using standard external rotation, extension, and adduction. The capsule was peeled off the inner aspect of the greater trochanter, taking care to preserve the short external rotators. A cookie cutter was used to enter the femoral canal, and then the femoral canal finder was placed. Sequential broaching was performed up to a size 7. Calcar planer was used on the femoral neck remnant. I placed a hi offset neck and a trial head ball. The hip was reduced. Leg lengths and offset were checked fluoroscopically. The hip was dislocated and trial components were removed. The final implants were placed, and the hip was reduced.  Fluoroscopy was used to confirm component position and leg lengths. At 90 degrees of external rotation and full extension, the hip was stable to an anterior directed force.  The wound was copiously irrigated with normal saline using pulse lavage. Marcaine solution was injected into the periarticular soft tissue. The wound was closed in layers using #1 Vicryl and V-Loc for the fascia, 2-0 Vicryl for the subcutaneous fat, 2-0 Monocryl for the deep dermal layer, 3-0 running Monocryl subcuticular stitch, and Dermabond for the skin. Once the glue was fully dried, an Aquacell Ag dressing was applied. The patient was transported to the  recovery room in stable condition. Sponge, needle, and instrument counts were correct at the end of the case x2. The patient tolerated the procedure well and there were no known complications.

## 2017-06-22 NOTE — Anesthesia Preprocedure Evaluation (Deleted)
Anesthesia Evaluation    Airway        Dental   Pulmonary former smoker,          Cardiovascular     Neuro/Psych    GI/Hepatic   Endo/Other    Renal/GU      Musculoskeletal   Abdominal   Peds  Hematology   Anesthesia Other Findings   Reproductive/Obstetrics                             Anesthesia Physical Anesthesia Plan Anesthesia Quick Evaluation  

## 2017-06-22 NOTE — Progress Notes (Signed)
Pt placed on CPAP for the night.  Tolerating well. 

## 2017-06-22 NOTE — Anesthesia Procedure Notes (Signed)
Procedure Name: MAC Date/Time: 06/22/2017 11:20 AM Performed by: Jenne Campus, CRNA Pre-anesthesia Checklist: Patient identified, Emergency Drugs available, Suction available and Patient being monitored Oxygen Delivery Method: Simple face mask

## 2017-06-22 NOTE — Transfer of Care (Signed)
Immediate Anesthesia Transfer of Care Note  Patient: [name]  Procedure(s) Performed: RIGHT TOTAL HIP ARTHROPLASTY ANTERIOR APPROACH (Right Hip)  Patient Location: PACU  Anesthesia Type:MAC and Spinal  Level of Consciousness: awake, oriented and patient cooperative  Airway & Oxygen Therapy: Patient Spontanous Breathing and Patient connected to nasal cannula oxygen  Post-op Assessment: Report given to RN and Post -op Vital signs reviewed and stable  Post vital signs: Reviewed  Last Vitals:  Vitals:   06/22/17 0848  BP: (!) 155/86  Pulse: 91  Resp: 20  Temp: 36.4 C  SpO2: 98%    Last Pain:  Vitals:   06/22/17 0848  TempSrc: Oral      Patients Stated Pain Goal: 2 (09/73/53 2992)  Complications: No apparent anesthesia complications

## 2017-06-22 NOTE — Discharge Instructions (Signed)
°Dr. Daylin Eads °Joint Replacement Specialist °[facility] °[address] °[place], [place] [address] °[phone] ° ° °TOTAL HIP REPLACEMENT POSTOPERATIVE DIRECTIONS ° ° ° °Hip Rehabilitation, Guidelines Following Surgery  ° °WEIGHT BEARING °Weight bearing as tolerated with assist device (walker, cane, etc) as directed, use it as long as suggested by your surgeon or therapist, typically at least 4-6 weeks. ° °The results of a hip operation are greatly improved after range of motion and muscle strengthening exercises. Follow all safety measures which are given to protect your hip. If any of these exercises cause increased pain or swelling in your joint, decrease the amount until you are comfortable again. Then slowly increase the exercises. Call your caregiver if you have problems or questions.  ° °HOME CARE INSTRUCTIONS  °Most of the following instructions are designed to prevent the dislocation of your new hip.  °Remove items at home which could result in a fall. This includes throw rugs or furniture in walking pathways.  °Continue medications as instructed at time of discharge. °· You may have some home medications which will be placed on hold until you complete the course of blood thinner medication. °· You may start showering once you are discharged home. Do not remove your dressing. °Do not put on socks or shoes without following the instructions of your caregivers.   °Sit on chairs with arms. Use the chair arms to help push yourself up when arising.  °Arrange for the use of a toilet seat elevator so you are not sitting low.  °· Walk with walker as instructed.  °You may resume a sexual relationship in one month or when given the OK by your caregiver.  °Use walker as long as suggested by your caregivers.  °You may put full weight on your legs and walk as much as is comfortable. °Avoid periods of inactivity such as sitting longer than an hour when not asleep. This helps prevent  blood clots.  °You may return to work once you are cleared by your surgeon.  °Do not drive a car for 6 weeks or until released by your surgeon.  °Do not drive while taking narcotics.  °Wear elastic stockings for two weeks following surgery during the day but you may remove then at night.  °Make sure you keep all of your appointments after your operation with all of your doctors and caregivers. You should call the office at the above phone number and make an appointment for approximately two weeks after the date of your surgery. °Please pick up a stool softener and laxative for home use as long as you are requiring pain medications. °· ICE to the affected hip every three hours for 30 minutes at a time and then as needed for pain and swelling. Continue to use ice on the hip for pain and swelling from surgery. You may notice swelling that will progress down to the foot and ankle.  This is normal after surgery.  Elevate the leg when you are not up walking on it.   °It is important for you to complete the blood thinner medication as prescribed by your doctor. °· Continue to use the breathing machine which will help keep your temperature down.  It is common for your temperature to cycle up and down following surgery, especially at night when you are not up moving around and exerting yourself.  The breathing machine keeps your lungs expanded and your temperature down. ° °RANGE OF MOTION AND STRENGTHENING EXERCISES  °These exercises are   designed to help you keep full movement of your hip joint. Follow your caregiver's or physical therapist's instructions. Perform all exercises about fifteen times, three times per day or as directed. Exercise both hips, even if you have had only one joint replacement. These exercises can be done on a training (exercise) mat, on the floor, on a table or on a bed. Use whatever works the best and is most comfortable for you. Use music or television while you are exercising so that the exercises  are a pleasant break in your day. This will make your life better with the exercises acting as a break in routine you can look forward to.  °Lying on your back, slowly slide your foot toward your buttocks, raising your knee up off the floor. Then slowly slide your foot back down until your leg is straight again.  °Lying on your back spread your legs as far apart as you can without causing discomfort.  °Lying on your side, raise your upper leg and foot straight up from the floor as far as is comfortable. Slowly lower the leg and repeat.  °Lying on your back, tighten up the muscle in the front of your thigh (quadriceps muscles). You can do this by keeping your leg straight and trying to raise your heel off the floor. This helps strengthen the largest muscle supporting your knee.  °Lying on your back, tighten up the muscles of your buttocks both with the legs straight and with the knee bent at a comfortable angle while keeping your heel on the floor.  ° °SKILLED REHAB INSTRUCTIONS: °If the patient is transferred to a skilled rehab facility following release from the hospital, a list of the current medications will be sent to the facility for the patient to continue.  When discharged from the skilled rehab facility, please have the facility set up the patient's Home Health Physical Therapy prior to being released. Also, the skilled facility will be responsible for providing the patient with their medications at time of release from the facility to include their pain medication and their blood thinner medication. If the patient is still at the rehab facility at time of the two week follow up appointment, the skilled rehab facility will also need to assist the patient in arranging follow up appointment in our office and any transportation needs. ° °MAKE SURE YOU:  °Understand these instructions.  °Will watch your condition.  °Will get help right away if you are not doing well or get worse. ° °Pick up stool softner and  laxative for home use following surgery while on pain medications. °Do not remove your dressing. °The dressing is waterproof--it is OK to take showers. °Continue to use ice for pain and swelling after surgery. °Do not use any lotions or creams on the incision until instructed by your surgeon. °Total Hip Protocol. ° ° °

## 2017-06-23 ENCOUNTER — Encounter (HOSPITAL_COMMUNITY): Payer: Self-pay | Admitting: Orthopedic Surgery

## 2017-06-23 LAB — CBC
HCT: 34.2 % — ABNORMAL LOW (ref 39.0–52.0)
Hemoglobin: 11.7 g/dL — ABNORMAL LOW (ref 13.0–17.0)
MCH: 33.1 pg (ref 26.0–34.0)
MCHC: 34.2 g/dL (ref 30.0–36.0)
MCV: 96.9 fL (ref 78.0–100.0)
Platelets: 161 10*3/uL (ref 150–400)
RBC: 3.53 MIL/uL — ABNORMAL LOW (ref 4.22–5.81)
RDW: 13.6 % (ref 11.5–15.5)
WBC: 11.2 10*3/uL — ABNORMAL HIGH (ref 4.0–10.5)

## 2017-06-23 LAB — BASIC METABOLIC PANEL
Anion gap: 10 (ref 5–15)
BUN: 15 mg/dL (ref 6–20)
CALCIUM: 8.5 mg/dL — AB (ref 8.9–10.3)
CO2: 24 mmol/L (ref 22–32)
CREATININE: 0.9 mg/dL (ref 0.61–1.24)
Chloride: 103 mmol/L (ref 101–111)
GFR calc non Af Amer: >60 mL/min (ref >60)
Glucose, Bld: 204 mg/dL — ABNORMAL HIGH (ref 65–99)
Potassium: 4 mmol/L (ref 3.5–5.1)
SODIUM: 137 mmol/L (ref 135–145)

## 2017-06-23 MED ORDER — SENNA 8.6 MG PO TABS: 2.0000 | ORAL_TABLET | Freq: Every day | ORAL | 0 refills | Status: DC

## 2017-06-23 MED ORDER — DOCUSATE SODIUM 100 MG PO CAPS: 100.0000 mg | ORAL_CAPSULE | Freq: Two times a day (BID) | ORAL | 1 refills | Status: DC

## 2017-06-23 MED ORDER — ONDANSETRON HCL 4 MG PO TABS: 4.0000 mg | ORAL_TABLET | Freq: Four times a day (QID) | ORAL | 0 refills | Status: DC | PRN

## 2017-06-23 MED ORDER — HYDROCODONE-ACETAMINOPHEN 5-325 MG PO TABS: 1.0000 | ORAL_TABLET | ORAL | 0 refills | Status: DC | PRN

## 2017-06-23 MED ORDER — ASPIRIN 81 MG PO CHEW: 81.0000 mg | CHEWABLE_TABLET | Freq: Two times a day (BID) | ORAL | 1 refills | Status: DC

## 2017-06-23 NOTE — Anesthesia Postprocedure Evaluation (Signed)
Anesthesia Post Note  Patient: [name]  Procedure(s) Performed: RIGHT TOTAL HIP ARTHROPLASTY ANTERIOR APPROACH (Right Hip)     Patient location during evaluation: PACU Anesthesia Type: Spinal Level of consciousness: awake and alert Pain management: pain level controlled Vital Signs Assessment: post-procedure vital signs reviewed and stable Respiratory status: spontaneous breathing and respiratory function stable Cardiovascular status: blood pressure returned to baseline and stable Postop Assessment: no headache, no backache, spinal receding and no apparent nausea or vomiting Anesthetic complications: no    Last Vitals:  Vitals:   06/22/17 2110 06/23/17 0503  BP: 117/71 125/82  Pulse: 99 98  Resp: 17 18  Temp: (!) 35.4 C 36.6 C  SpO2: 96% 99%    Last Pain:  Vitals:   06/23/17 0635  TempSrc:   PainSc: 3                  Montez Hageman

## 2017-06-23 NOTE — Progress Notes (Signed)
Discharge teaching complete. Meds, diet, activity, follow up appointments and incision care reviewed and all questions answered. Copy of instructions and prescriptions given to patient Patient discharged home via wheelchair with wife.

## 2017-06-23 NOTE — Evaluation (Signed)
Physical Therapy Evaluation Patient Details Name: [name] MRN: [id number] DOB: [birth year] Today's Date: 06/23/2017   History of Present Illness  77 y.o. male admitted on 06/22/17 for elective R THA direct anterior approach.  Pt with significant PMH of COPD, CAD, Barrett's esophagus, L knee arthroscopy, and coronary angio with stent.  Clinical Impression  Pt is POD #1 and is moving well, albeit sometimes to quickly to be safe.  He is supervision overall and min guard assist on stairs.  Verbal cues for safety throughout.  We discussed when to progress to a cane and reviewed his entire HEP.  PT to sign off as he is due to d/c home after my session and is safe to do so with his wife's supervision.      Follow Up Recommendations Follow surgeon's recommendation for DC plan and follow-up therapies    Equipment Recommendations  Rolling walker with 5" wheels    Recommendations for Other Services   NA    Precautions / Restrictions Precautions Precautions: Fall Precaution Comments: Pt is a bit fast to move and impulsive. Restrictions Weight Bearing Restrictions: Yes RLE Weight Bearing: Weight bearing as tolerated      Mobility  Bed Mobility Overal bed mobility: Modified Independent                Transfers Overall transfer level: Modified independent Equipment used: Rolling walker (2 wheeled)             General transfer comment: uses hands and momentum for transitions.   Ambulation/Gait Ambulation/Gait assistance: Supervision Ambulation Distance (Feet): 250 Feet Assistive device: Rolling walker (2 wheeled) Gait Pattern/deviations: Step-through pattern;Trunk flexed Gait velocity: too fast to be safe at this point.    General Gait Details: Verbal cues for safe RW use and closer proximity to RW.  Needs cues for taller posture as his pre morbid habit is to bend over.   Stairs Stairs: Yes Stairs assistance: Min guard Stair Management: No rails;Step to  pattern;Forwards;With walker Number of Stairs: 1(x3 ) General stair comments: Pt needed verbal cues for safe technique and multiple reinforcements of LE sequencing and to SLOW DOWN.          Balance Overall balance assessment: Needs assistance Sitting-balance support: No upper extremity supported Sitting balance-Leahy Scale: Good     Standing balance support: Single extremity supported;Bilateral upper extremity supported Standing balance-Leahy Scale: Fair                               Pertinent Vitals/Pain Pain Assessment: 0-10 Pain Score: 7  Pain Location: right hip Pain Descriptors / Indicators: Aching;Burning Pain Intervention(s): Limited activity within patient's tolerance;Monitored during session;Repositioned    Home Living Family/patient expects to be discharged to:: Private residence Living Arrangements: Spouse/significant other;Children Available Help at Discharge: Family;Available 24 hours/day Type of Home: House Home Access: Stairs to enter   [employer] of Steps: 1 Home Layout: One level Home Equipment: Cane - single point(pt reports he is not sure if he has a RW still at home)      Prior Function Level of Independence: Independent with assistive device(s)         Comments: uses cane     Hand Dominance   Dominant Hand: Right    Extremity/Trunk Assessment   Upper Extremity Assessment Upper Extremity Assessment: Overall WFL for tasks assessed    Lower Extremity Assessment Lower Extremity Assessment: RLE deficits/detail RLE Deficits / Details: right leg  with normal post op pain and weakness.  Ankle at least 3/5, knee 3/5, hip 2+/5    Cervical / Trunk Assessment Cervical / Trunk Assessment: Other exceptions Cervical / Trunk Exceptions: Pt is very flexed at his trunk, needs cues for upright posture.    Communication   Communication: No difficulties  Cognition Arousal/Alertness: Awake/alert Behavior During Therapy:  Impulsive Overall Cognitive Status: Within Functional Limits for tasks assessed                                 General Comments: Pt is a bit quick to move, parks his RW and walks around without it and is not safe to do so yet.          Exercises Total Joint Exercises Ankle Circles/Pumps: AROM;Both;20 reps Quad Sets: AROM;Both;10 reps Short Arc Quad: AROM;Right;10 reps Heel Slides: AROM;Right;10 reps Hip ABduction/ADduction: AROM;Right;10 reps;Supine;Standing Long Arc Quad: AROM;Right;10 reps Knee Flexion: AROM;Right;10 reps;Standing Marching in Standing: AROM;Right;10 reps Standing Hip Extension: AROM;Right;10 reps   Assessment/Plan    PT Assessment All further PT needs can be met in the next venue of care  PT Problem List Decreased strength;Decreased range of motion;Decreased activity tolerance;Decreased balance;Decreased mobility;Decreased knowledge of use of DME;Pain       PT Treatment Interventions      PT Goals (Current goals can be found in the Care Plan section)  Acute Rehab PT Goals Patient Stated Goal: to go home today PT Goal Formulation: All assessment and education complete, DC therapy               AM-PAC PT "6 Clicks" Daily Activity  Outcome Measure Difficulty turning over in bed (including adjusting bedclothes, sheets and blankets)?: None Difficulty moving from lying on back to sitting on the side of the bed? : None Difficulty sitting down on and standing up from a chair with arms (e.g., wheelchair, bedside commode, etc,.)?: None Help needed moving to and from a bed to chair (including a wheelchair)?: None Help needed walking in hospital room?: None Help needed climbing 3-5 steps with a railing? : A Little 6 Click Score: 23    End of Session   Activity Tolerance: Patient tolerated treatment well Patient left: in chair;with call bell/phone within reach;with family/visitor present Nurse Communication: Mobility status PT Visit  Diagnosis: Difficulty in walking, not elsewhere classified (R26.2);Pain;Muscle weakness (generalized) (M62.81) Pain - Right/Left: Right Pain - part of body: Hip    Time: 1000-1026 PT Time Calculation (min) (ACUTE ONLY): 26 min   Charges:         Lurena Joiner B. Roddie Riegler, PT, DPT [phone]   PT Evaluation $PT Eval Moderate Complexity: 1 Mod PT Treatments $Gait Training: 8-22 mins   06/23/2017, 1:04 PM

## 2017-06-23 NOTE — Progress Notes (Signed)
    Subjective:  Patient reports pain as mild to moderate.  Denies N/V/CP/SOB. Wants to go home.  Objective:   VITALS:   Vitals:   06/22/17 1552 06/22/17 1614 06/22/17 2110 06/23/17 0503  BP: 131/76 131/79 117/71 125/82  Pulse: 86 91 99 98  Resp: 12 16 17 18   Temp: 97.9 F (36.6 C) 98.2 F (36.8 C) (!) 95.7 F (35.4 C) 97.9 F (36.6 C)  TempSrc:  Oral Axillary Oral  SpO2: 95% 99% 96% 99%  Weight:        NAD ABD soft Sensation intact distally Intact pulses distally Dorsiflexion/Plantar flexion intact Incision: dressing C/D/I Compartment soft   Lab Results  Component Value Date   WBC 11.2 (H) 06/23/2017   HGB 11.7 (L) 06/23/2017   HCT 34.2 (L) 06/23/2017   MCV 96.9 06/23/2017   PLT 161 06/23/2017   BMET    Component Value Date/Time   NA 140 06/10/2017 0943   NA 141 12/09/2016 0848   K 4.0 06/10/2017 0943   CL 106 06/10/2017 0943   CO2 25 06/10/2017 0943   GLUCOSE 119 (H) 06/10/2017 0943   BUN 12 06/10/2017 0943   BUN 13 12/09/2016 0848   CREATININE 0.77 06/10/2017 0943   CREATININE 0.83 12/28/2014 1518   CALCIUM 9.1 06/10/2017 0943   GFRNONAA >60 06/10/2017 0943   GFRAA >60 06/10/2017 0943     Assessment/Plan: 1 Day Post-Op   Principal Problem:   Primary osteoarthritis of right hip Active Problems:   Osteoarthritis of right hip   WBAT with walker DVT ppx: ASA, SCDs, TEDS PO pain control PT/OT Dispo: D/C home today with HEP    [name] 06/23/2017, 7:43 AM   Rod Can, MD Cell [phone]

## 2017-06-23 NOTE — Care Management Note (Signed)
Case Management Note  Patient Details  Name: [name] MRN: [id number] Date of Birth: 02/20/1941  Subjective/Objective:  77 yr old gentleman s/p right total hip arthroplasty.                  Action/Plan: Patient will not require Home Health at discharge. He will follow the HEP as outlined by Dr. Lyla Glassing. He will have support at discharge.    Expected Discharge Date:  06/23/17               Expected Discharge Plan:  Home/Self Care  In-House Referral:  NA  Discharge planning Services  CM Consult  Post Acute Care Choice:  Durable Medical Equipment Choice offered to:  NA  DME Arranged:  Walker rolling DME Agency:  [place]:  NA [place] Agency:  NA  Status of Service:  Completed, signed off  If discussed at [place] of Stay Meetings, dates discussed:    Additional Comments:  Ninfa Meeker, RN 06/23/2017, 11:11 AM

## 2017-06-23 NOTE — Discharge Summary (Signed)
Physician Discharge Summary  Patient ID: [name] MRN: [id number] DOB/AGE: Apr 15, 1941 77 y.o.  Admit date: 06/22/2017 Discharge date: 06/23/2017  Admission Diagnoses:  Primary osteoarthritis of right hip  Discharge Diagnoses:  Principal Problem:   Primary osteoarthritis of right hip Active Problems:   Osteoarthritis of right hip   Past Medical History:  Diagnosis Date  . Allergic asthma   . Barrett's esophagus   . BPH (benign prostatic hyperplasia)   . CAD (coronary artery disease)   . COPD (chronic obstructive pulmonary disease) (HCC)   . GERD (gastroesophageal reflux disease)   . Hiatal hernia   . Hyperlipidemia   . Hypothyroidism   . Psoriasis   . Sleep apnea    wears CPAP nightly    Surgeries: Procedure(s): RIGHT TOTAL HIP ARTHROPLASTY ANTERIOR APPROACH on 06/22/2017   Consultants (if any):   Discharged Condition: Improved  Hospital Course: [name] is an 77 y.o. male who was admitted 06/22/2017 with a diagnosis of Primary osteoarthritis of right hip and went to the operating room on 06/22/2017 and underwent the above named procedures.    He was given perioperative antibiotics:  Anti-infectives (From admission, onward)   Start     Dose/Rate Route Frequency Ordered Stop   06/22/17 1700  ceFAZolin (ANCEF) IVPB 2g/100 mL premix     2 g 200 mL/hr over 30 Minutes Intravenous Every 6 hours 06/22/17 1610 06/23/17 0301   06/22/17 0600  ceFAZolin (ANCEF) 3 g in dextrose 5 % 50 mL IVPB     3 g 130 mL/hr over 30 Minutes Intravenous On call to O.R. 06/19/17 1107 06/22/17 1132    .  He was given sequential compression devices, early ambulation, and ASA for DVT prophylaxis.  He benefited maximally from the hospital stay and there were no complications.    Recent vital signs:  Vitals:   06/23/17 0503 06/23/17 0845  BP: 125/82 115/69  Pulse: 98 (!) 102  Resp: 18   Temp: 97.9 F (36.6 C) 97.6 F (36.4 C)  SpO2: 99% 97%    Recent laboratory studies:   Lab Results  Component Value Date   HGB 11.7 (L) 06/23/2017   HGB 14.2 06/10/2017   HGB 15.6 08/16/2014   Lab Results  Component Value Date   WBC 11.2 (H) 06/23/2017   PLT 161 06/23/2017   Lab Results  Component Value Date   INR 1.05 08/15/2014   Lab Results  Component Value Date   NA 137 06/23/2017   K 4.0 06/23/2017   CL 103 06/23/2017   CO2 24 06/23/2017   BUN 15 06/23/2017   CREATININE 0.90 06/23/2017   GLUCOSE 204 (H) 06/23/2017    Discharge Medications:   Allergies as of 06/23/2017   No Known Allergies     Medication List    STOP taking these medications   aspirin EC 81 MG tablet Replaced by:  aspirin 81 MG chewable tablet     TAKE these medications   albuterol 108 (90 Base) MCG/ACT inhaler Commonly known as:  PROVENTIL HFA;VENTOLIN HFA Inhale 1-2 puffs into the lungs every 6 (six) hours as needed for wheezing or shortness of breath.   amLODipine 5 MG tablet Commonly known as:  NORVASC Take 1 tablet (5 mg total) by mouth daily.   aspirin 81 MG chewable tablet Chew 1 tablet (81 mg total) by mouth 2 (two) times daily. Replaces:  aspirin EC 81 MG tablet   atorvastatin 40 MG tablet Commonly known as:  LIPITOR Take 40  mg by mouth every evening. What changed:  Another medication with the same name was removed. Continue taking this medication, and follow the directions you see here.   betamethasone dipropionate 0.05 % cream Commonly known as:  DIPROLENE APPLY TO AFFECTED AREA ON SKIN TWICE A DAY   cycloSPORINE 0.05 % ophthalmic emulsion Commonly known as:  RESTASIS Place 1 drop into both eyes 2 (two) times daily.   docusate sodium 100 MG capsule Commonly known as:  COLACE Take 1 capsule (100 mg total) by mouth 2 (two) times daily.   fexofenadine 180 MG tablet Commonly known as:  ALLEGRA Take 180 mg by mouth daily.   fluticasone 0.05 % cream Commonly known as:  CUTIVATE Apply 1 application topically 2 (two) times daily.   furosemide 20 MG  tablet Commonly known as:  LASIX Take 2 tablets (40 mg total) by mouth daily.   GLUCOSAMINE 1500 COMPLEX PO Take 2 capsules by mouth daily.   HYDROcodone-acetaminophen 5-325 MG tablet Commonly known as:  NORCO/VICODIN Take 1-2 tablets by mouth every 4 (four) hours as needed.   KLOR-CON M10 10 MEQ tablet Generic drug:  potassium chloride TAKE 1 TABLET (10 MEQ TOTAL) BY MOUTH DAILY.   levothyroxine 137 MCG tablet Commonly known as:  SYNTHROID, LEVOTHROID Take 137 mcg by mouth daily before breakfast.   LUTEIN PO Take 1 tablet by mouth daily.   Melatonin 5 MG Caps Take 5 mg by mouth at bedtime.   naproxen sodium 220 MG tablet Commonly known as:  ALEVE Take 440 mg by mouth 2 (two) times daily as needed (pain).   nitroGLYCERIN 0.4 MG/SPRAY spray Commonly known as:  NITROLINGUAL Place 1 spray under the tongue every 5 (five) minutes x 3 doses as needed for chest pain. Use as directed as needed   NONFORMULARY OR COMPOUNDED ITEM Allergy Vaccine 1:10 Given at [facility]   Omega-3 1000 MG Caps Take 1000 mg by mouth once daily   ondansetron 4 MG tablet Commonly known as:  ZOFRAN Take 1 tablet (4 mg total) by mouth every 6 (six) hours as needed for nausea.   pantoprazole 40 MG tablet Commonly known as:  PROTONIX Take 30-60 min before first meal of the day What changed:  additional instructions   PROSTATE HEALTH Caps Take 1 capsule by mouth daily.   SENIOR MULTIVITAMIN PLUS PO Take 1 tablet by mouth daily.   senna 8.6 MG Tabs tablet Commonly known as:  SENOKOT Take 2 tablets (17.2 mg total) by mouth at bedtime.   sildenafil 100 MG tablet Commonly known as:  VIAGRA Take 100 mg by mouth daily as needed for erectile dysfunction.   VITAMIN B-12 PO Take 5,000 mcg by mouth.   vitamin C 500 MG tablet Commonly known as:  ASCORBIC ACID Take 1,000 mg by mouth daily.   Vitamin D3 1000 units Caps Take 1,000 Units by mouth daily.            Durable Medical  Equipment  (From admission, onward)        Start     Ordered   06/23/17 1123  For home use only DME Walker rolling  Once    Question:  Patient needs a walker to treat with the following condition  Answer:  S/P total hip arthroplasty   06/23/17 1122      Diagnostic Studies: Dg Pelvis Portable  Result Date: 06/22/2017 CLINICAL DATA:  Status post right hip arthroplasty. EXAM: PORTABLE PELVIS 1-2 VIEWS COMPARISON:  Intraoperative imaging of earlier in  the day. FINDINGS: Single AP view of the pelvis demonstrates right hip arthroplasty. No periprosthetic fractures or other acute complication. Mild left hip osteoarthritis. IMPRESSION: Expected appearance after right hip arthroplasty. Electronically Signed   By: Jeronimo Greaves M.D.   On: 06/22/2017 14:19   Dg C-arm 1-60 Min  Result Date: 06/22/2017 CLINICAL DATA:  77 year old male undergoing anterior right hip arthroplasty. EXAM: OPERATIVE RIGHT HIP (WITH PELVIS IF PERFORMED) 2 VIEWS TECHNIQUE: Fluoroscopic spot image(s) were submitted for interpretation post-operatively. COMPARISON:  Abdomen and pelvis radiographs 06/26/2010. FLUOROSCOPY TIME:  0 min 25 sec FINDINGS: 2 intraoperative fluoroscopic spot images of the lower pelvis and right hip in the AP projection. Bipolar hip arthroplasty is in place and appears normally aligned in the AP view. The hardware components appear intact. No unexpected osseous changes identified. IMPRESSION: Intraoperative fluoroscopy of bipolar right hip arthroplasty with no adverse features. Electronically Signed   By: Odessa Fleming M.D.   On: 06/22/2017 13:53   Dg C-arm 1-60 Min  Result Date: 06/22/2017 CLINICAL DATA:  77 year old male undergoing anterior right hip arthroplasty. EXAM: OPERATIVE RIGHT HIP (WITH PELVIS IF PERFORMED) 2 VIEWS TECHNIQUE: Fluoroscopic spot image(s) were submitted for interpretation post-operatively. COMPARISON:  Abdomen and pelvis radiographs 06/26/2010. FLUOROSCOPY TIME:  0 min 25 sec FINDINGS: 2  intraoperative fluoroscopic spot images of the lower pelvis and right hip in the AP projection. Bipolar hip arthroplasty is in place and appears normally aligned in the AP view. The hardware components appear intact. No unexpected osseous changes identified. IMPRESSION: Intraoperative fluoroscopy of bipolar right hip arthroplasty with no adverse features. Electronically Signed   By: Odessa Fleming M.D.   On: 06/22/2017 13:53   Dg Hip Operative Unilat W Or W/o Pelvis Right  Result Date: 06/22/2017 CLINICAL DATA:  77 year old male undergoing anterior right hip arthroplasty. EXAM: OPERATIVE RIGHT HIP (WITH PELVIS IF PERFORMED) 2 VIEWS TECHNIQUE: Fluoroscopic spot image(s) were submitted for interpretation post-operatively. COMPARISON:  Abdomen and pelvis radiographs 06/26/2010. FLUOROSCOPY TIME:  0 min 25 sec FINDINGS: 2 intraoperative fluoroscopic spot images of the lower pelvis and right hip in the AP projection. Bipolar hip arthroplasty is in place and appears normally aligned in the AP view. The hardware components appear intact. No unexpected osseous changes identified. IMPRESSION: Intraoperative fluoroscopy of bipolar right hip arthroplasty with no adverse features. Electronically Signed   By: Odessa Fleming M.D.   On: 06/22/2017 13:53    Disposition: 01-Home or Self Care  Discharge Instructions    Call MD / Call 911   Complete by:  As directed    If you experience chest pain or shortness of breath, CALL 911 and be transported to the hospital emergency room.  If you develope a fever above 101 F, pus (white drainage) or increased drainage or redness at the wound, or calf pain, call your surgeon's office.   Constipation Prevention   Complete by:  As directed    Drink plenty of fluids.  Prune juice may be helpful.  You may use a stool softener, such as Colace (over the counter) 100 mg twice a day.  Use MiraLax (over the counter) for constipation as needed.   Diet - low sodium heart healthy   Complete by:  As  directed    Driving restrictions   Complete by:  As directed    No driving for 6 weeks   Increase activity slowly as tolerated   Complete by:  As directed    Lifting restrictions   Complete by:  As directed  No lifting for 6 weeks   TED hose   Complete by:  As directed    Use stockings (TED hose) for 2 weeks on both leg(s).  You may remove them at night for sleeping.      Follow-up Information    Arnell Mausolf, Arlys John, MD. Schedule an appointment as soon as possible for a visit in 2 weeks.   Specialty:  Orthopedic Surgery Why:  For wound re-check Contact information: [address] [place] [address] [place] [address] [phone]            Signed: Iline Oven Carolin Quang 06/23/2017, 1:31 PM

## 2017-06-29 ENCOUNTER — Emergency Department (HOSPITAL_BASED_OUTPATIENT_CLINIC_OR_DEPARTMENT_OTHER): Payer: Medicare PPO

## 2017-06-29 ENCOUNTER — Emergency Department (HOSPITAL_BASED_OUTPATIENT_CLINIC_OR_DEPARTMENT_OTHER)
Admission: EM | Admit: 2017-06-29 | Discharge: 2017-06-30 | Disposition: A | Discharge status: Home / Self Care | Payer: Medicare PPO | Attending: Emergency Medicine | Admitting: Emergency Medicine

## 2017-06-29 ENCOUNTER — Encounter (HOSPITAL_BASED_OUTPATIENT_CLINIC_OR_DEPARTMENT_OTHER): Payer: Self-pay | Admitting: Emergency Medicine

## 2017-06-29 ENCOUNTER — Other Ambulatory Visit: Payer: Self-pay

## 2017-06-29 DIAGNOSIS — I10 Essential (primary) hypertension: Secondary | ICD-10-CM | POA: Insufficient documentation

## 2017-06-29 DIAGNOSIS — I259 Chronic ischemic heart disease, unspecified: Secondary | ICD-10-CM | POA: Insufficient documentation

## 2017-06-29 DIAGNOSIS — Z87891 Personal history of nicotine dependence: Secondary | ICD-10-CM | POA: Diagnosis not present

## 2017-06-29 DIAGNOSIS — Z7982 Long term (current) use of aspirin: Secondary | ICD-10-CM | POA: Insufficient documentation

## 2017-06-29 DIAGNOSIS — E039 Hypothyroidism, unspecified: Secondary | ICD-10-CM | POA: Diagnosis not present

## 2017-06-29 DIAGNOSIS — Z79899 Other long term (current) drug therapy: Secondary | ICD-10-CM | POA: Insufficient documentation

## 2017-06-29 DIAGNOSIS — R238 Other skin changes: Secondary | ICD-10-CM | POA: Diagnosis not present

## 2017-06-29 DIAGNOSIS — R030 Elevated blood-pressure reading, without diagnosis of hypertension: Secondary | ICD-10-CM | POA: Diagnosis not present

## 2017-06-29 DIAGNOSIS — M7989 Other specified soft tissue disorders: Secondary | ICD-10-CM

## 2017-06-29 DIAGNOSIS — R600 Localized edema: Secondary | ICD-10-CM | POA: Diagnosis present

## 2017-06-29 DIAGNOSIS — J449 Chronic obstructive pulmonary disease, unspecified: Secondary | ICD-10-CM | POA: Insufficient documentation

## 2017-06-29 DIAGNOSIS — Z96641 Presence of right artificial hip joint: Secondary | ICD-10-CM | POA: Diagnosis not present

## 2017-06-29 LAB — CBC WITH DIFFERENTIAL/PLATELET
BASOS PCT: 1 %
Basophils Absolute: 0.1 10*3/uL (ref 0.0–0.1)
EOS ABS: 0.3 10*3/uL (ref 0.0–0.7)
Eosinophils Relative: 5 %
HCT: 34.1 % — ABNORMAL LOW (ref 39.0–52.0)
HEMOGLOBIN: 11.2 g/dL — AB (ref 13.0–17.0)
LYMPHS ABS: 1.2 10*3/uL (ref 0.7–4.0)
LYMPHS PCT: 17 %
MCH: 33 pg (ref 26.0–34.0)
MCHC: 32.8 g/dL (ref 30.0–36.0)
MCV: 100.6 fL — ABNORMAL HIGH (ref 78.0–100.0)
Monocytes Absolute: 0.5 10*3/uL (ref 0.1–1.0)
Monocytes Relative: 8 %
NEUTROS ABS: 4.7 10*3/uL (ref 1.7–7.7)
Neutrophils Relative %: 69 %
Platelets: 224 10*3/uL (ref 150–400)
RBC: 3.39 MIL/uL — ABNORMAL LOW (ref 4.22–5.81)
RDW: 13.6 % (ref 11.5–15.5)
WBC: 6.8 10*3/uL (ref 4.0–10.5)

## 2017-06-29 LAB — COMPREHENSIVE METABOLIC PANEL
ALK PHOS: 102 U/L (ref 38–126)
ALT: 21 U/L (ref 17–63)
ANION GAP: 8 (ref 5–15)
AST: 22 U/L (ref 15–41)
Albumin: 3.6 g/dL (ref 3.5–5.0)
BUN: 17 mg/dL (ref 6–20)
CALCIUM: 8.7 mg/dL — AB (ref 8.9–10.3)
CO2: 28 mmol/L (ref 22–32)
Chloride: 101 mmol/L (ref 101–111)
Creatinine, Ser: 0.79 mg/dL (ref 0.61–1.24)
GFR calc non Af Amer: >60 mL/min (ref >60)
Glucose, Bld: 104 mg/dL — ABNORMAL HIGH (ref 65–99)
Potassium: 4.1 mmol/L (ref 3.5–5.1)
SODIUM: 137 mmol/L (ref 135–145)
Total Bilirubin: 1.1 mg/dL (ref 0.3–1.2)
Total Protein: 6.5 g/dL (ref 6.5–8.1)

## 2017-06-29 LAB — BRAIN NATRIURETIC PEPTIDE: B Natriuretic Peptide: 19.4 pg/mL (ref 0.0–100.0)

## 2017-06-29 MED ORDER — IBUPROFEN 200 MG PO TABS
600.0000 mg | ORAL_TABLET | Freq: Once | ORAL | Status: AC
  Administered 2017-06-29: 600 mg via ORAL
  Filled 2017-06-29: qty 1

## 2017-06-29 NOTE — ED Notes (Signed)
ED Provider at bedside. 

## 2017-06-29 NOTE — ED Notes (Signed)
Pt c/o pain to RLE; sts he has been taking "2 Aleves and a hydrocodone" bid.

## 2017-06-29 NOTE — ED Triage Notes (Signed)
Pt to ED via EMS with c/o BLE edema, RT worse than LT; RLE weeping

## 2017-06-30 NOTE — Discharge Instructions (Signed)
1.  Increase your Lasix dose to 40 mg in the morning and 40 mg early afternoon for 3 days.  See your doctor for recheck within the next 3-4 days. 2.  Keep your blister dressed with a clean gauze and lightly wrapped dressing.  Try to avoid the dressing being so tight that it creates swelling above and below the dressing. 3.  Keep your legs elevated above the level of your heart as much as possible. 4.  When practical, you will need to start wearing compression hose again. 5.  You may benefit from follow-up with the wound care clinic for management of chronic venous stasis and associated wounds.

## 2017-06-30 NOTE — ED Provider Notes (Signed)
[facility] EMERGENCY DEPARTMENT Provider Note   CSN: [id number] Arrival date & time: 06/29/17  1816     History   Chief Complaint Chief Complaint  Patient presents with  . Leg Swelling    HPI [name] is a 77 y.o. male.  HPI Patient is status post elective right hip replacement 2\25\2019.  He has had problems with chronic swelling in his left leg but noted that over the past day the swelling has increased and he particularly became concerned because a ping-pong ball sized blister arose on his lower leg and then drained straw-colored fluid.  He contacted his family doctor office and was advised to come to the emergency department.  He reports that he seems to be healing well in the right hip.  He is not having increasing pain.  He has been up and using the hip.  Reports no redness or discomfort around the incision site of the hip.  Patient also has notable swelling of the left lower extremity.  He states he usually does not have that much swelling.  He does however advised that his cardiologist has told him in the past that he has venous stasis.  No chest pain, no shortness of breath, no fever, no malaise. Past Medical History:  Diagnosis Date  . Allergic asthma   . Barrett's esophagus   . BPH (benign prostatic hyperplasia)   . CAD (coronary artery disease)   . COPD (chronic obstructive pulmonary disease) ([place])   . GERD (gastroesophageal reflux disease)   . Hiatal hernia   . Hyperlipidemia   . Hypothyroidism   . Psoriasis   . Sleep apnea    wears CPAP nightly    Patient Active Problem List   Diagnosis Date Noted  . Primary osteoarthritis of right hip 06/22/2017  . Osteoarthritis of right hip 06/22/2017  . Pre-operative clearance 05/19/2017  . Weight gain 03/30/2015  . Constipation 12/28/2014  . Bilateral lower extremity edema 12/28/2014  . RBBB 08/14/2014  . Psoriasis 08/14/2014  . Chest pain with high risk of acute coronary syndrome 08/14/2014  .  Essential hypertension 08/14/2014  . Unstable angina ([place]) 08/14/2014  . Hoarseness 06/15/2014  . Rash and nonspecific skin eruption 06/15/2014  . Submandibular swelling 05/15/2013  . Asthma with acute exacerbation 08/18/2012  . Eustachian tube dysfunction 04/05/2012  . Allergic rhinitis due to pollen 06/28/2010  . ABDOMINAL BRUIT 03/04/2010  . Dyslipidemia 03/07/2009  . CAD S/P PCI LAD 1987, `1997, 08/15/14 PCI pLAD DES 03/07/2009  . BPH (benign prostatic hyperplasia) 03/07/2009  . Hypothyroidism 03/06/2009  . Allergic-infective asthma 04/06/2007  . Centrilobular emphysema ([place]) 04/06/2007    Past Surgical History:  Procedure Laterality Date  . COLONOSCOPY  03/2017  . CORONARY ANGIOPLASTY WITH STENT PLACEMENT  08/15/14   PCI of pLAD Xience alpine DES  . EYE SURGERY  01/2017   eyelids lifted  . LEFT HEART CATHETERIZATION WITH CORONARY ANGIOGRAM N/A 08/15/2014   Procedure: LEFT HEART CATHETERIZATION WITH CORONARY ANGIOGRAM;  Surgeon: Peter M Martinique, MD;  Location: [facility] CATH LAB;  Service: Cardiovascular;  Laterality: N/A;  . left knee arthroscopy    . NASAL POLYP EXCISION  1953  . NASAL SINUS SURGERY    . TONSILLECTOMY    . TOTAL HIP ARTHROPLASTY Right 06/22/2017   Procedure: RIGHT TOTAL HIP ARTHROPLASTY ANTERIOR APPROACH;  Surgeon: Rod Can, MD;  Location: [facility];  Service: Orthopedics;  Laterality: Right;  Needs RNFA       Home Medications  Prior to Admission medications   Medication Sig Start Date End Date Taking? Authorizing Provider  albuterol (PROVENTIL HFA;VENTOLIN HFA) 108 (90 BASE) MCG/ACT inhaler Inhale 1-2 puffs into the lungs every 6 (six) hours as needed for wheezing or shortness of breath. 09/26/14   Baird Lyons D, MD  amLODipine (NORVASC) 5 MG tablet Take 1 tablet (5 mg total) by mouth daily. 09/16/16 06/02/17  Lelon Perla, MD  aspirin 81 MG chewable tablet Chew 1 tablet (81 mg total) by mouth 2 (two) times daily. 06/23/17   Swinteck, Aaron Edelman, MD    atorvastatin (LIPITOR) 40 MG tablet Take 40 mg by mouth every evening.    [provider]  betamethasone dipropionate (DIPROLENE) 0.05 % cream APPLY TO AFFECTED AREA ON SKIN TWICE A DAY 03/22/17   [provider]  Cholecalciferol (VITAMIN D3) 1000 UNITS CAPS Take 1,000 Units by mouth daily.    [provider]  Cyanocobalamin (VITAMIN B-12 PO) Take 5,000 mcg by mouth.    [provider]  cycloSPORINE (RESTASIS) 0.05 % ophthalmic emulsion Place 1 drop into both eyes 2 (two) times daily.      [provider]  docusate sodium (COLACE) 100 MG capsule Take 1 capsule (100 mg total) by mouth 2 (two) times daily. 06/23/17   Swinteck, Aaron Edelman, MD  fexofenadine (ALLEGRA) 180 MG tablet Take 180 mg by mouth daily.    [provider]  fluticasone (CUTIVATE) 0.05 % cream Apply 1 application topically 2 (two) times daily.  07/21/14   [provider]  furosemide (LASIX) 20 MG tablet Take 2 tablets (40 mg total) by mouth daily. 09/16/16   Lelon Perla, MD  Glucosamine-Chondroit-Vit C-Mn (GLUCOSAMINE 1500 COMPLEX PO) Take 2 capsules by mouth daily.    [provider]  HYDROcodone-acetaminophen (NORCO/VICODIN) 5-325 MG tablet Take 1-2 tablets by mouth every 4 (four) hours as needed. 06/23/17   Swinteck, Aaron Edelman, MD  KLOR-CON M10 10 MEQ tablet TAKE 1 TABLET (10 MEQ TOTAL) BY MOUTH DAILY. 02/20/17   Lelon Perla, MD  levothyroxine (SYNTHROID, LEVOTHROID) 137 MCG tablet Take 137 mcg by mouth daily before breakfast.    [provider]  LUTEIN PO Take 1 tablet by mouth daily.    [provider]  Melatonin 5 MG CAPS Take 5 mg by mouth at bedtime.     [provider]  Misc Natural Products (PROSTATE HEALTH) CAPS Take 1 capsule by mouth daily.    [provider]  Multiple Vitamins-Minerals (SENIOR MULTIVITAMIN PLUS PO) Take 1 tablet by mouth daily.     [provider]  naproxen sodium (ALEVE) 220 MG tablet  Take 440 mg by mouth 2 (two) times daily as needed (pain).    [provider]  nitroGLYCERIN (NITROLINGUAL) 0.4 MG/SPRAY spray Place 1 spray under the tongue every 5 (five) minutes x 3 doses as needed for chest pain. Use as directed as needed 05/24/13   Lelon Perla, MD  NONFORMULARY OR COMPOUNDED ITEM Allergy Vaccine 1:10 Given at [facility]    [provider]  Omega-3 1000 MG CAPS Take 1000 mg by mouth once daily    [provider]  ondansetron (ZOFRAN) 4 MG tablet Take 1 tablet (4 mg total) by mouth every 6 (six) hours as needed for nausea. 06/23/17   Swinteck, Aaron Edelman, MD  pantoprazole (PROTONIX) 40 MG tablet Take 30-60 min before first meal of the day Patient taking differently: Take 40 mg once daily 30-60 min before first meal of the day  03/17/12   Tanda Rockers, MD  senna (SENOKOT) 8.6 MG TABS tablet Take 2 tablets (17.2 mg total) by mouth at bedtime. 06/23/17   Swinteck, Aaron Edelman, MD  sildenafil (VIAGRA) 100 MG tablet Take 100 mg by mouth daily as needed for erectile dysfunction.    [provider]  vitamin C (ASCORBIC ACID) 500 MG tablet Take 1,000 mg by mouth daily.    [provider]    Family History Family History  Problem Relation Age of Onset  . Lung cancer Father   . Dementia Mother   . COPD Mother     Social History Social History   Tobacco Use  . Smoking status: Former Smoker    Packs/day: 3.00    Years: 33.00    Pack years: 99.00    Types: Pipe, Cigars    Last attempt to quit: 04/28/1985    Years since quitting: 32.1  . Smokeless tobacco: Never Used  Substance Use Topics  . Alcohol use: Yes    Alcohol/week: 1.2 oz    Types: 1 Glasses of wine, 1 Shots of liquor per week  . Drug use: No     Allergies   Patient has no known allergies.   Review of Systems Review of Systems 10 Systems reviewed and are negative for acute change except as noted in the HPI.   Physical Exam Updated Vital Signs BP 129/75  (BP Location: Right Arm)   Pulse 95   Temp 97.8 F (36.6 C) (Oral)   Resp 18   SpO2 99%   Physical Exam  Constitutional: He is oriented to person, place, and time.  Patient is alert and well in appearance.  No respiratory distress.  Clear mental status.  HENT:  Head: Normocephalic and atraumatic.  Mouth/Throat: Oropharynx is clear and moist.  Eyes: EOM are normal.  Neck: Neck supple.  Cardiovascular: Normal rate, regular rhythm, normal heart sounds and intact distal pulses.  Pulmonary/Chest: Effort normal and breath sounds normal.  Abdominal: Soft. He exhibits no distension. There is no tenderness.  Musculoskeletal:  Patient has occlusive dressing over incision site on the right hip.  There is no erythema surrounding the dressing.  Area is nontender.  Some resolving hematoma. Patient does have bilateral 2-3+ pitting edema.  The left lower extremity has no erythema.  Calf is nontender.  Foot is in good condition.  Right lower extremity has more discoloration from resolving hematoma diffusely.  There is a ruptured approximately 5 cm bullae on the medial mid lower leg.  Neurological: He is alert and oriented to person, place, and time. No cranial nerve deficit. He exhibits normal muscle tone. Coordination normal.  Skin: Skin is warm and dry.  Psychiatric: He has a normal mood and affect.     ED Treatments / Results  Labs (all labs ordered are listed, but only abnormal results are displayed) Labs Reviewed  CBC WITH DIFFERENTIAL/PLATELET - Abnormal; Notable for the following components:      Result Value   RBC 3.39 (*)    Hemoglobin 11.2 (*)    HCT 34.1 (*)    MCV 100.6 (*)    All other components within normal limits  COMPREHENSIVE METABOLIC PANEL - Abnormal; Notable for the following components:   Glucose, Bld 104 (*)    Calcium 8.7 (*)    All other components within normal limits  BRAIN NATRIURETIC PEPTIDE    EKG  EKG Interpretation  Date/Time:  Monday June 29 2017  22:40:30 EST Ventricular Rate:  91  PR Interval:    QRS Duration: 154 QT Interval:  397 QTC Calculation: 489 R Axis:   10 Text Interpretation:  Sinus rhythm Right bundle branch block Baseline wander in lead(s) V2 old RBBB. no sig interval change Confirmed by Charlesetta Shanks [phone]) on 06/30/2017 1:08:28 AM       Radiology Dg Chest 2 View  Result Date: 06/29/2017 CLINICAL DATA:  Lower extremity swelling EXAM: CHEST  2 VIEW COMPARISON:  09/11/2016 FINDINGS: Mild chronic bronchitic changes. No acute consolidation or pleural effusion. Cardiomediastinal silhouette within normal limits. Aortic atherosclerosis. No pneumothorax. Degenerative changes of the spine. IMPRESSION: No active cardiopulmonary disease. Electronically Signed   By: Donavan Foil M.D.   On: 06/29/2017 23:32   US Venous Img Lower Bilateral  Result Date: 06/30/2017 CLINICAL DATA:  Bilateral lower extremity pain and edema right greater than left with recent hip surgery EXAM: BILATERAL LOWER EXTREMITY VENOUS DOPPLER ULTRASOUND TECHNIQUE: Gray-scale sonography with graded compression, as well as color Doppler and duplex ultrasound were performed to evaluate the lower extremity deep venous systems from the level of the common femoral vein and including the common femoral, femoral, profunda femoral, popliteal and calf veins including the posterior tibial, peroneal and gastrocnemius veins when visible. The superficial great saphenous vein was also interrogated. Spectral Doppler was utilized to evaluate flow at rest and with distal augmentation maneuvers in the common femoral, femoral and popliteal veins. COMPARISON:  11/12/2015 FINDINGS: RIGHT LOWER EXTREMITY Common Femoral Vein: No evidence of thrombus. Normal compressibility, respiratory phasicity and response to augmentation. Saphenofemoral Junction: No evidence of thrombus. Normal compressibility and flow on color Doppler imaging. Profunda Femoral Vein: No evidence of thrombus. Normal  compressibility and flow on color Doppler imaging. Femoral Vein: No evidence of thrombus. Normal compressibility, respiratory phasicity and response to augmentation. Popliteal Vein: No evidence of thrombus. Normal compressibility, respiratory phasicity and response to augmentation. Calf Veins: No evidence of thrombus. Normal compressibility and flow on color Doppler imaging. Peroneal vein not well seen. Superficial Great Saphenous Vein: No evidence of thrombus. Normal compressibility. Venous Reflux:  None. Other Findings:  None. LEFT LOWER EXTREMITY Common Femoral Vein: No evidence of thrombus. Normal compressibility, respiratory phasicity and response to augmentation. Saphenofemoral Junction: No evidence of thrombus. Normal compressibility and flow on color Doppler imaging. Profunda Femoral Vein: No evidence of thrombus. Normal compressibility and flow on color Doppler imaging. Femoral Vein: No evidence of thrombus. Normal compressibility, respiratory phasicity and response to augmentation. Popliteal Vein: No evidence of thrombus. Normal compressibility, respiratory phasicity and response to augmentation. Calf Veins: No evidence of thrombus. Normal compressibility and flow on color Doppler imaging. Peroneal vein not well seen. Superficial Great Saphenous Vein: No evidence of thrombus. Normal compressibility. Venous Reflux:  None. Other Findings:  None. IMPRESSION: No evidence of deep venous thrombosis. Electronically Signed   By: Donavan Foil M.D.   On: 06/30/2017 00:42    Procedures Procedures (including critical care time)  Medications Ordered in ED Medications  ibuprofen (ADVIL,MOTRIN) tablet 600 mg (600 mg Oral Given 06/29/17 2105)     Initial Impression / Assessment and Plan / ED Course  I have reviewed the triage vital signs and the nursing notes.  Pertinent labs & imaging results that were available during my care of the patient were reviewed by me and considered in my medical decision making  (see chart for details).     Final Clinical Impressions(s) / ED Diagnoses   Final diagnoses:  Leg edema  Bullae  Status post right hip replacement  Patient presents as outlined above.  Ultrasound results are negative for DVT.  Diagnostic lab work is stable.  No elevation in BNP.  No signs of congestive heart failure on chest x-ray.  Patient is clinically well.  He is alert and nontoxic without respiratory distress.  At this time high suspicion is for chronic venous stasis.  She is counseled on the nature of venous stasis.  Will increase Lasix dose for 3 days and then resume normal dose.  Patient is to have close follow-up with PCP.  He is also counseled on wound care clinic.  Return precautions reviewed. ED Discharge Orders    None       Charlesetta Shanks, MD 06/30/17 0110

## 2017-07-02 ENCOUNTER — Encounter: Payer: Medicare PPO | Attending: Nurse Practitioner | Admitting: Nurse Practitioner

## 2017-07-02 DIAGNOSIS — J449 Chronic obstructive pulmonary disease, unspecified: Secondary | ICD-10-CM | POA: Insufficient documentation

## 2017-07-02 DIAGNOSIS — E785 Hyperlipidemia, unspecified: Secondary | ICD-10-CM | POA: Insufficient documentation

## 2017-07-02 DIAGNOSIS — Z96641 Presence of right artificial hip joint: Secondary | ICD-10-CM | POA: Insufficient documentation

## 2017-07-02 DIAGNOSIS — I10 Essential (primary) hypertension: Secondary | ICD-10-CM | POA: Diagnosis not present

## 2017-07-02 DIAGNOSIS — I2510 Atherosclerotic heart disease of native coronary artery without angina pectoris: Secondary | ICD-10-CM | POA: Insufficient documentation

## 2017-07-02 DIAGNOSIS — L03115 Cellulitis of right lower limb: Secondary | ICD-10-CM | POA: Insufficient documentation

## 2017-07-02 DIAGNOSIS — L97919 Non-pressure chronic ulcer of unspecified part of right lower leg with unspecified severity: Secondary | ICD-10-CM | POA: Insufficient documentation

## 2017-07-02 DIAGNOSIS — Z87891 Personal history of nicotine dependence: Secondary | ICD-10-CM | POA: Insufficient documentation

## 2017-07-03 NOTE — Progress Notes (Signed)
STAFFORD, CHRISTEL ([id number]) Visit Report for 07/02/2017 Abuse/Suicide Risk Screen Details Patient Name: UZAIR, MANER Date of Service: 07/02/2017 9:45 AM Medical Record Number: [id number] Patient Account Number: [id number] Date of Birth/Sex: 10-26-1940 (77 y.o. Male) Treating RN: Curtis Sites Primary Care Avonna Iribe: Merri Brunette Other Clinician: Referring Chanette Demo: Merri Brunette Treating Shery Wauneka/Extender: Kathreen Cosier in Treatment: 0 Abuse/Suicide Risk Screen Items Answer ABUSE/SUICIDE RISK SCREEN: Has anyone close to you tried to hurt or harm you recentlyo No Do you feel uncomfortable with anyone in your familyo No Has anyone forced you do things that you didnot want to doo No Do you have any thoughts of harming yourselfo No Patient displays signs or symptoms of abuse and/or neglect. No Electronic Signature(s) Signed: 07/02/2017 5:02:17 PM By: Curtis Sites Entered By: Curtis Sites on 07/02/2017 10:00:56 Gosdin, Hulen Skains ([id number]) -------------------------------------------------------------------------------- Activities of Daily Living Details Patient Name: Jearld Lesch Date of Service: 07/02/2017 9:45 AM Medical Record Number: [id number] Patient Account Number: [id number] Date of Birth/Sex: 1941-03-31 (77 y.o. Male) Treating RN: Curtis Sites Primary Care Ichiro Chesnut: Merri Brunette Other Clinician: Referring Tamina Cyphers: Merri Brunette Treating Oiva Dibari/Extender: Kathreen Cosier in Treatment: 0 Activities of Daily Living Items Answer Activities of Daily Living (Please select one for each item) Drive Automobile Not Able Take Medications Completely Able Use Telephone Completely Able Care for Appearance Completely Able Use Toilet Completely Able Bath / Shower Completely Able Dress Self Completely Able Feed Self Completely Able Walk Completely Able Get In / Out Bed Completely Able Housework Completely Able Prepare Meals Completely Able Handle Money  Completely Able Shop for Self Completely Able Electronic Signature(s) Signed: 07/02/2017 5:02:17 PM By: Curtis Sites Entered By: Curtis Sites on 07/02/2017 10:01:22 Jearld Lesch ([id number]) -------------------------------------------------------------------------------- Education Assessment Details Patient Name: Jearld Lesch Date of Service: 07/02/2017 9:45 AM Medical Record Number: [id number] Patient Account Number: [id number] Date of Birth/Sex: [birth year] (77 y.o. Male) Treating RN: Curtis Sites Primary Care Stuti Sandin: Merri Brunette Other Clinician: Referring Dayanara Sherrill: Merri Brunette Treating Erynn Vaca/Extender: Kathreen Cosier in Treatment: 0 Primary Learner Assessed: Caregiver HHRN Reason Patient is not Primary Learner: wound location Learning Preferences/Education Level/Primary Language Learning Preference: Explanation, Demonstration Highest Education Level: College or Above Preferred Language: English Cognitive Barrier Assessment/Beliefs Language Barrier: No Translator Needed: No Memory Deficit: No Emotional Barrier: No Cultural/Religious Beliefs Affecting Medical Care: No Physical Barrier Assessment Impaired Vision: No Impaired Hearing: No Decreased Hand dexterity: No Knowledge/Comprehension Assessment Knowledge Level: Medium Comprehension Level: Medium Ability to understand written Medium instructions: Ability to understand verbal Medium instructions: Motivation Assessment Anxiety Level: Calm Cooperation: Cooperative Education Importance: Acknowledges Need Interest in Health Problems: Asks Questions Perception: Coherent Willingness to Engage in Self- Medium Management Activities: Readiness to Engage in Self- Medium Management Activities: Electronic Signature(s) Signed: 07/02/2017 5:02:17 PM By: Curtis Sites Entered By: Curtis Sites on 07/02/2017 10:02:27 KASTLE, MCCLARAN  ([id number]) -------------------------------------------------------------------------------- Fall Risk Assessment Details Patient Name: Jearld Lesch Date of Service: 07/02/2017 9:45 AM Medical Record Number: [id number] Patient Account Number: [id number] Date of Birth/Sex: 11-Apr-1941 (77 y.o. Male) Treating RN: Curtis Sites Primary Care Karinda Cabriales: Merri Brunette Other Clinician: Referring Cristine Daw: Merri Brunette Treating Adeliz Tonkinson/Extender: Kathreen Cosier in Treatment: 0 Fall Risk Assessment Items Have you had 2 or more falls in the last 12 monthso 0 No Have you had any fall that resulted in injury in the last 12 monthso 0 No FALL RISK ASSESSMENT: History of falling - immediate or within 3 months 0 No Secondary diagnosis 0 No Ambulatory aid None/bed rest/wheelchair/nurse 0  No Crutches/cane/walker 15 Yes Furniture 0 No IV Access/Saline Lock 0 No Gait/Training Normal/bed rest/immobile 0 No Weak 10 Yes Impaired 0 No Mental Status Oriented to own ability 0 Yes Electronic Signature(s) Signed: 07/02/2017 5:02:17 PM By: Curtis Sites Entered By: Curtis Sites on 07/02/2017 10:01:42 Marchiano, Hulen Skains ([id number]) -------------------------------------------------------------------------------- Nutrition Risk Assessment Details Patient Name: Jearld Lesch Date of Service: 07/02/2017 9:45 AM Medical Record Number: [id number] Patient Account Number: [id number] Date of Birth/Sex: June 11, 1940 (77 y.o. Male) Treating RN: Curtis Sites Primary Care Toneka Fullen: Merri Brunette Other Clinician: Referring Kyrah Schiro: Merri Brunette Treating Takoda Siedlecki/Extender: Bonnell Public Weeks in Treatment: 0 Height (in): 70 Weight (lbs): 258 Body Mass Index (BMI): 37 Nutrition Risk Assessment Items NUTRITION RISK SCREEN: I have an illness or condition that made me change the kind and/or amount of 0 No food I eat I eat fewer than two meals per day 0 No I eat few fruits and vegetables, or milk products  0 No I have three or more drinks of beer, liquor or wine almost every day 0 No I have tooth or mouth problems that make it hard for me to eat 0 No I don't always have enough money to buy the food I need 0 No I eat alone most of the time 0 No I take three or more different prescribed or over-the-counter drugs a day 1 Yes Without wanting to, I have lost or gained 10 pounds in the last six months 0 No I am not always physically able to shop, cook and/or feed myself 0 No Nutrition Protocols Good Risk Protocol 0 No interventions needed Moderate Risk Protocol Electronic Signature(s) Signed: 07/02/2017 5:02:17 PM By: Curtis Sites Entered By: Curtis Sites on 07/02/2017 10:01:50

## 2017-07-03 NOTE — Progress Notes (Signed)
97.6 Respiratory Rate 22 (breaths/min): Wound Assessments Treatment Notes Electronic Signature(s) Signed: 07/02/2017 1:03:28 PM By: Lawanda Cousins Entered By: Lawanda Cousins on 07/02/2017 13:03:28 [name] ([id number]) -------------------------------------------------------------------------------- [facility] Details Patient Name: [name] Date of Service: 07/02/2017 9:45 AM Medical Record Number: [id number] Patient Account Number: [id number] Date of Birth/Sex: 1940/11/23 (77 y.o. Male) Treating RN: Ahmed Prima Primary Care Jaclin Finks: Deland Pretty Other Clinician: Referring Franciscojavier Wronski: Deland Pretty Treating Kacey Dysert/Extender: Cathie Olden in Treatment: 0 Active Inactive ` Abuse / Safety / Falls / Self Care Management Nursing Diagnoses: Potential for falls Goals: Patient will not experience any injury related to falls Date Initiated: 07/02/2017 Target Resolution Date: 10/31/2017 Goal Status: Active Interventions: Podiatry chair,  stretcher in low position and side rails up as needed Assess Activities of Daily Living upon admission and as needed Assess fall risk on admission and as needed Assess personal safety and home safety (as indicated) on admission and as needed Notes: ` Orientation to the Wound Care Program Nursing Diagnoses: Knowledge deficit related to the wound healing center program Goals: Patient/caregiver will verbalize understanding of the [facility] Date Initiated: 07/02/2017 Target Resolution Date: 08/01/2017 Goal Status: Active Interventions: Provide education on orientation to the wound center Notes: ` Pain, Acute or Chronic Nursing Diagnoses: Pain, acute or chronic: actual or potential Potential alteration in comfort, pain Goals: Patient/caregiver will verbalize adequate pain control between visits [name] ([id number]) Date Initiated: 07/02/2017 Target Resolution Date: 10/31/2017 Goal Status: Active Interventions: Complete pain assessment as per visit requirements Notes: ` Wound/Skin Impairment Nursing Diagnoses: Impaired tissue integrity Knowledge deficit related to ulceration/compromised skin integrity Goals: Ulcer/skin breakdown will have a volume reduction of 80% by week 12 Date Initiated: 07/02/2017 Target Resolution Date: 10/03/2017 Goal Status: Active Interventions: Assess patient/caregiver ability to perform ulcer/skin care regimen upon admission and as needed Assess ulceration(s) every visit Notes: Electronic Signature(s) Signed: 07/02/2017 4:33:49 PM By: Alric Quan Entered By: Alric Quan on 07/02/2017 10:38:49 [name] ([id number]) -------------------------------------------------------------------------------- Non-Wound Condition Assessment Details Patient Name: [name] Date of Service: 07/02/2017 9:45 AM Medical Record Number: [id number] Patient Account Number: [id number] Date of Birth/Sex: 06/20/1940 (77 y.o.  Male) Treating RN: Montey Hora Primary Care Avia Merkley: Deland Pretty Other Clinician: Referring Dolan Xia: Deland Pretty Treating Zoya Sprecher/Extender: Cathie Olden in Treatment: 0 Non-Wound Condition: Condition: Other Dermatologic Condition Location: Leg Side: Right Periwound Skin Texture Texture Color No Abnormalities Noted: No No Abnormalities Noted: No Moisture No Abnormalities Noted: No Notes 3.7x4.2 cm blister on right lower leg Electronic Signature(s) Signed: 07/02/2017 5:02:17 PM By: Montey Hora Entered By: Montey Hora on 07/02/2017 10:17:47 [name] ([id number]) -------------------------------------------------------------------------------- Pain Assessment Details Patient Name: [name] Date of Service: 07/02/2017 9:45 AM Medical Record Number: [id number] Patient Account Number: [id number] Date of Birth/Sex: 20-Jul-1940 (77 y.o. Male) Treating RN: Montey Hora Primary Care Diane Mochizuki: Deland Pretty Other Clinician: Referring Amrit Cress: Deland Pretty Treating Lachrisha Ziebarth/Extender: Cathie Olden in Treatment: 0 Active Problems Location of Pain Severity and Description of Pain Patient Has Paino Yes Site Locations Pain Location: Pain in Ulcers With Dressing Change: Yes Duration of the Pain. Constant / Intermittento Constant Pain Management and Medication Current Pain Management: Notes Topical or injectable lidocaine is offered to patient for acute pain when surgical debridement is performed. If needed, Patient is instructed to use over the counter pain medication for the following 24-48 hours after debridement. Wound care MDs do not prescribed pain medications. Patient has chronic pain or uncontrolled pain. Patient has  [name] ([id number]) Visit Report for 07/02/2017 Allergy List Details Patient Name: [name] Date of Service: 07/02/2017 9:45 AM Medical Record Number: [id number] Patient Account Number: [id number] Date of Birth/Sex: [birth year] (77 y.o. Male) Treating RN: Montey Hora Primary Care Sevin Langenbach: Deland Pretty Other Clinician: Referring Regnia Mathwig: Deland Pretty Treating Brason Berthelot/Extender: Lawanda Cousins Weeks in Treatment: 0 Allergies Active Allergies No Known Allergies Allergy Notes Electronic Signature(s) Signed: 07/02/2017 5:02:17 PM By: Montey Hora Entered By: Montey Hora on 07/02/2017 10:00:44 [name] ([id number]) -------------------------------------------------------------------------------- Arrival Information Details Patient Name: [name] Date of Service: 07/02/2017 9:45 AM Medical Record Number: [id number] Patient Account Number: [id number] Date of Birth/Sex: 1941-04-06 (77 y.o. Male) Treating RN: Montey Hora Primary Care Afia Messenger: Deland Pretty Other Clinician: Referring Vanya Carberry: Deland Pretty Treating Vaun Hyndman/Extender: Cathie Olden in Treatment: 0 Visit Information Patient Arrived: Walker Arrival Time: 09:56 Accompanied By: spouse Transfer Assistance: None Patient Identification Verified: Yes Secondary Verification Process Yes Completed: Patient Has Alerts: Yes Patient Alerts: Patient on Blood Thinner 2 aspirin 81 mg Electronic Signature(s) Signed: 07/02/2017 5:02:17 PM By: Montey Hora Entered By: Montey Hora on 07/02/2017 09:57:23 Reels, Karl [name] ([id number]) -------------------------------------------------------------------------------- Clinic Level of Care Assessment Details Patient Name: [name] Date of Service: 07/02/2017 9:45 AM Medical Record Number: [id number] Patient Account Number: [id number] Date of Birth/Sex: 20-Nov-1940 (77 y.o. Male) Treating RN: Ahmed Prima Primary Care Kelani Robart: Deland Pretty Other Clinician: Referring Elona Yinger: Deland Pretty Treating Zaire Levesque/Extender: Cathie Olden in Treatment: 0 Clinic Level of Care Assessment Items TOOL 2 Quantity Score X - Use when only an EandM is performed on the INITIAL visit 1 0 ASSESSMENTS - Nursing Assessment / Reassessment X - General Physical Exam (combine w/ comprehensive assessment (listed just below) when 1 20 performed on new pt. evals) X- 1 25 Comprehensive Assessment (HX, ROS, Risk Assessments, Wounds Hx, etc.) ASSESSMENTS - Wound and Skin Assessment / Reassessment []  - Simple Wound Assessment / Reassessment - one wound 0 []  - 0 Complex Wound Assessment / Reassessment - multiple wounds []  - 0 Dermatologic / Skin Assessment (not related to wound area) ASSESSMENTS - Ostomy and/or Continence Assessment and Care []  - Incontinence Assessment and Management 0 []  - 0 Ostomy Care Assessment and Management (repouching, etc.) PROCESS - Coordination of Care []  - Simple Patient / Family Education for ongoing care 0 X- 1 20 Complex (extensive) Patient / Family Education for ongoing care X- 1 10 Staff obtains Programmer, systems, Records, Test Results / Process Orders X- 1 10 Staff telephones HHA, Nursing Homes / Clarify orders / etc []  - 0 Routine Transfer to another Facility (non-emergent condition) []  - 0 Routine Hospital Admission (non-emergent condition) []  - 0 New Admissions / Biomedical engineer / Ordering NPWT, Apligraf, etc. []  - 0 Emergency Hospital Admission (emergent condition) X- 1 10 Simple Discharge Coordination []  - 0 Complex (extensive) Discharge Coordination PROCESS - Special Needs []  - Pediatric / Minor Patient Management 0 []  - 0 Isolation Patient Management JAHRELL, HAMOR. ([id number]) []  - 0 Hearing / Language / Visual special needs []  - 0 Assessment of Community assistance (transportation, D/C planning, etc.) []  - 0 Additional assistance / Altered mentation []  - 0 Support  Surface(s) Assessment (bed, cushion, seat, etc.) INTERVENTIONS - Wound Cleansing / Measurement X - Wound Imaging (photographs - any number of wounds) 1 5 []  - 0 Wound Tracing (instead of photographs) []  - 0 Simple Wound Measurement - one wound []  - 0 Complex Wound Measurement - multiple wounds []  - 0 Simple  97.6 Respiratory Rate 22 (breaths/min): Wound Assessments Treatment Notes Electronic Signature(s) Signed: 07/02/2017 1:03:28 PM By: Lawanda Cousins Entered By: Lawanda Cousins on 07/02/2017 13:03:28 [name] ([id number]) -------------------------------------------------------------------------------- [facility] Details Patient Name: [name] Date of Service: 07/02/2017 9:45 AM Medical Record Number: [id number] Patient Account Number: [id number] Date of Birth/Sex: 1940/11/23 (77 y.o. Male) Treating RN: Ahmed Prima Primary Care Jaclin Finks: Deland Pretty Other Clinician: Referring Franciscojavier Wronski: Deland Pretty Treating Kacey Dysert/Extender: Cathie Olden in Treatment: 0 Active Inactive ` Abuse / Safety / Falls / Self Care Management Nursing Diagnoses: Potential for falls Goals: Patient will not experience any injury related to falls Date Initiated: 07/02/2017 Target Resolution Date: 10/31/2017 Goal Status: Active Interventions: Podiatry chair,  stretcher in low position and side rails up as needed Assess Activities of Daily Living upon admission and as needed Assess fall risk on admission and as needed Assess personal safety and home safety (as indicated) on admission and as needed Notes: ` Orientation to the Wound Care Program Nursing Diagnoses: Knowledge deficit related to the wound healing center program Goals: Patient/caregiver will verbalize understanding of the [facility] Date Initiated: 07/02/2017 Target Resolution Date: 08/01/2017 Goal Status: Active Interventions: Provide education on orientation to the wound center Notes: ` Pain, Acute or Chronic Nursing Diagnoses: Pain, acute or chronic: actual or potential Potential alteration in comfort, pain Goals: Patient/caregiver will verbalize adequate pain control between visits [name] ([id number]) Date Initiated: 07/02/2017 Target Resolution Date: 10/31/2017 Goal Status: Active Interventions: Complete pain assessment as per visit requirements Notes: ` Wound/Skin Impairment Nursing Diagnoses: Impaired tissue integrity Knowledge deficit related to ulceration/compromised skin integrity Goals: Ulcer/skin breakdown will have a volume reduction of 80% by week 12 Date Initiated: 07/02/2017 Target Resolution Date: 10/03/2017 Goal Status: Active Interventions: Assess patient/caregiver ability to perform ulcer/skin care regimen upon admission and as needed Assess ulceration(s) every visit Notes: Electronic Signature(s) Signed: 07/02/2017 4:33:49 PM By: Alric Quan Entered By: Alric Quan on 07/02/2017 10:38:49 [name] ([id number]) -------------------------------------------------------------------------------- Non-Wound Condition Assessment Details Patient Name: [name] Date of Service: 07/02/2017 9:45 AM Medical Record Number: [id number] Patient Account Number: [id number] Date of Birth/Sex: 06/20/1940 (77 y.o.  Male) Treating RN: Montey Hora Primary Care Avia Merkley: Deland Pretty Other Clinician: Referring Dolan Xia: Deland Pretty Treating Zoya Sprecher/Extender: Cathie Olden in Treatment: 0 Non-Wound Condition: Condition: Other Dermatologic Condition Location: Leg Side: Right Periwound Skin Texture Texture Color No Abnormalities Noted: No No Abnormalities Noted: No Moisture No Abnormalities Noted: No Notes 3.7x4.2 cm blister on right lower leg Electronic Signature(s) Signed: 07/02/2017 5:02:17 PM By: Montey Hora Entered By: Montey Hora on 07/02/2017 10:17:47 [name] ([id number]) -------------------------------------------------------------------------------- Pain Assessment Details Patient Name: [name] Date of Service: 07/02/2017 9:45 AM Medical Record Number: [id number] Patient Account Number: [id number] Date of Birth/Sex: 20-Jul-1940 (77 y.o. Male) Treating RN: Montey Hora Primary Care Diane Mochizuki: Deland Pretty Other Clinician: Referring Amrit Cress: Deland Pretty Treating Lachrisha Ziebarth/Extender: Cathie Olden in Treatment: 0 Active Problems Location of Pain Severity and Description of Pain Patient Has Paino Yes Site Locations Pain Location: Pain in Ulcers With Dressing Change: Yes Duration of the Pain. Constant / Intermittento Constant Pain Management and Medication Current Pain Management: Notes Topical or injectable lidocaine is offered to patient for acute pain when surgical debridement is performed. If needed, Patient is instructed to use over the counter pain medication for the following 24-48 hours after debridement. Wound care MDs do not prescribed pain medications. Patient has chronic pain or uncontrolled pain. Patient has  97.6 Respiratory Rate 22 (breaths/min): Wound Assessments Treatment Notes Electronic Signature(s) Signed: 07/02/2017 1:03:28 PM By: Lawanda Cousins Entered By: Lawanda Cousins on 07/02/2017 13:03:28 [name] ([id number]) -------------------------------------------------------------------------------- [facility] Details Patient Name: [name] Date of Service: 07/02/2017 9:45 AM Medical Record Number: [id number] Patient Account Number: [id number] Date of Birth/Sex: 1940/11/23 (77 y.o. Male) Treating RN: Ahmed Prima Primary Care Jaclin Finks: Deland Pretty Other Clinician: Referring Franciscojavier Wronski: Deland Pretty Treating Kacey Dysert/Extender: Cathie Olden in Treatment: 0 Active Inactive ` Abuse / Safety / Falls / Self Care Management Nursing Diagnoses: Potential for falls Goals: Patient will not experience any injury related to falls Date Initiated: 07/02/2017 Target Resolution Date: 10/31/2017 Goal Status: Active Interventions: Podiatry chair,  stretcher in low position and side rails up as needed Assess Activities of Daily Living upon admission and as needed Assess fall risk on admission and as needed Assess personal safety and home safety (as indicated) on admission and as needed Notes: ` Orientation to the Wound Care Program Nursing Diagnoses: Knowledge deficit related to the wound healing center program Goals: Patient/caregiver will verbalize understanding of the [facility] Date Initiated: 07/02/2017 Target Resolution Date: 08/01/2017 Goal Status: Active Interventions: Provide education on orientation to the wound center Notes: ` Pain, Acute or Chronic Nursing Diagnoses: Pain, acute or chronic: actual or potential Potential alteration in comfort, pain Goals: Patient/caregiver will verbalize adequate pain control between visits [name] ([id number]) Date Initiated: 07/02/2017 Target Resolution Date: 10/31/2017 Goal Status: Active Interventions: Complete pain assessment as per visit requirements Notes: ` Wound/Skin Impairment Nursing Diagnoses: Impaired tissue integrity Knowledge deficit related to ulceration/compromised skin integrity Goals: Ulcer/skin breakdown will have a volume reduction of 80% by week 12 Date Initiated: 07/02/2017 Target Resolution Date: 10/03/2017 Goal Status: Active Interventions: Assess patient/caregiver ability to perform ulcer/skin care regimen upon admission and as needed Assess ulceration(s) every visit Notes: Electronic Signature(s) Signed: 07/02/2017 4:33:49 PM By: Alric Quan Entered By: Alric Quan on 07/02/2017 10:38:49 [name] ([id number]) -------------------------------------------------------------------------------- Non-Wound Condition Assessment Details Patient Name: [name] Date of Service: 07/02/2017 9:45 AM Medical Record Number: [id number] Patient Account Number: [id number] Date of Birth/Sex: 06/20/1940 (77 y.o.  Male) Treating RN: Montey Hora Primary Care Avia Merkley: Deland Pretty Other Clinician: Referring Dolan Xia: Deland Pretty Treating Zoya Sprecher/Extender: Cathie Olden in Treatment: 0 Non-Wound Condition: Condition: Other Dermatologic Condition Location: Leg Side: Right Periwound Skin Texture Texture Color No Abnormalities Noted: No No Abnormalities Noted: No Moisture No Abnormalities Noted: No Notes 3.7x4.2 cm blister on right lower leg Electronic Signature(s) Signed: 07/02/2017 5:02:17 PM By: Montey Hora Entered By: Montey Hora on 07/02/2017 10:17:47 [name] ([id number]) -------------------------------------------------------------------------------- Pain Assessment Details Patient Name: [name] Date of Service: 07/02/2017 9:45 AM Medical Record Number: [id number] Patient Account Number: [id number] Date of Birth/Sex: 20-Jul-1940 (77 y.o. Male) Treating RN: Montey Hora Primary Care Diane Mochizuki: Deland Pretty Other Clinician: Referring Amrit Cress: Deland Pretty Treating Lachrisha Ziebarth/Extender: Cathie Olden in Treatment: 0 Active Problems Location of Pain Severity and Description of Pain Patient Has Paino Yes Site Locations Pain Location: Pain in Ulcers With Dressing Change: Yes Duration of the Pain. Constant / Intermittento Constant Pain Management and Medication Current Pain Management: Notes Topical or injectable lidocaine is offered to patient for acute pain when surgical debridement is performed. If needed, Patient is instructed to use over the counter pain medication for the following 24-48 hours after debridement. Wound care MDs do not prescribed pain medications. Patient has chronic pain or uncontrolled pain. Patient has

## 2017-07-03 NOTE — Progress Notes (Signed)
LAVELL, SUPPLE ([id number]) Visit Report for 07/02/2017 Chief Complaint Document Details Patient Name: [name], [name] Date of Service: 07/02/2017 9:45 AM Medical Record Number: [id number] Patient Account Number: [id number] Date of Birth/Sex: Mar 12, 1941 (77 y.o. Male) Treating RN: Ahmed Prima Primary Care Provider: Deland Pretty Other Clinician: Referring Provider: Deland Pretty Treating Provider/Extender: Cathie Olden in Treatment: 0 Information Obtained from: Patient Chief Complaint He is here for evaluation of RLE blisters Electronic Signature(s) Signed: 07/02/2017 1:08:06 PM By: Lawanda Cousins Entered By: Lawanda Cousins on 07/02/2017 13:08:06 RAEL, YO ([id number]) -------------------------------------------------------------------------------- HPI Details Patient Name: [name] Date of Service: 07/02/2017 9:45 AM Medical Record Number: [id number] Patient Account Number: [id number] Date of Birth/Sex: [birth year] (77 y.o. Male) Treating RN: Ahmed Prima Primary Care Provider: Deland Pretty Other Clinician: Referring Provider: Deland Pretty Treating Provider/Extender: Cathie Olden in Treatment: 0 History of Present Illness Location: RLE Quality: no pain Duration: +/-1week Context: secondary to edema HPI Description: 07/02/17-he is here for initial dilation of multiple blisters of the right lower extremity. He is status post right [facility] 2/25 for arthritis. He stated postoperatively he did have thigh-high TED hose on bilaterally until discharged. He has had progressive swelling to his bilateral lower extremities, right more so than left. He has developed multiple blisters to the anterior aspect of his right lower extremity. He was evaluated on Monday at the emergency department in [facility]. DVT study was negative, he was discharged on Ace wrap compression and a three-day increase in his Lasix. He has been applying daily compression therapy since. His echo  in 09/2016 with EF 50-55%. Large anterior bullae was drained of serous fluid, no concern for infection. Will treat with bilateral lower extremity compression wrap; anticipate this to be significantly improved at next week's appointment and can then be measured for long-term compression therapy. Electronic Signature(s) Signed: 07/02/2017 2:29:34 PM By: Lawanda Cousins Previous Signature: 07/02/2017 1:24:16 PM Version By: Lawanda Cousins Entered By: Lawanda Cousins on 07/02/2017 14:29:33 KITAI, PURDOM ([id number]) -------------------------------------------------------------------------------- Physical Exam Details Patient Name: [name] Date of Service: 07/02/2017 9:45 AM Medical Record Number: [id number] Patient Account Number: [id number] Date of Birth/Sex: [birth year] (77 y.o. Male) Treating RN: Ahmed Prima Primary Care Provider: Deland Pretty Other Clinician: Referring Provider: Deland Pretty Treating Provider/Extender: Lawanda Cousins Weeks in Treatment: 0 Respiratory non-labored. expiratory wheeze. Cardiovascular s1 s2 regular, rate controlled. RLE- palpable DP, PT. BLE edema (R>L), warm to touch, faint hemosiderin staining. Musculoskeletal ambulates with walker assistance. Psychiatric does appear to have good insight or judgement to medical needs. oriented x4. calm, cooperative. Electronic Signature(s) Signed: 07/02/2017 2:30:43 PM By: Lawanda Cousins Entered By: Lawanda Cousins on 07/02/2017 14:30:42 [name], [name] ([id number]) -------------------------------------------------------------------------------- Physician Orders Details Patient Name: [name] Date of Service: 07/02/2017 9:45 AM Medical Record Number: [id number] Patient Account Number: [id number] Date of Birth/Sex: 21-Feb-1941 (77 y.o. Male) Treating RN: Ahmed Prima Primary Care Provider: Deland Pretty Other Clinician: Referring Provider: Deland Pretty Treating Provider/Extender: Cathie Olden in  Treatment: 0 Verbal / Phone Orders: Yes ClinicianCarolyne Fiscal, Debi Read Back and Verified: Yes Diagnosis Coding Wound Cleansing o Clean wound with Normal Saline. o Cleanse wound with mild soap and water o May shower with protection. Anesthetic (add to Medication List) o Topical Lidocaine 4% cream applied to wound bed prior to debridement (In Clinic Only). Primary Wound Dressing o Foam - right anterior lower leg o Other: - right anterior leg cover with mepitel one over the wounds then place the foam down Secondary Dressing   LAVELL, SUPPLE ([id number]) Visit Report for 07/02/2017 Chief Complaint Document Details Patient Name: [name], [name] Date of Service: 07/02/2017 9:45 AM Medical Record Number: [id number] Patient Account Number: [id number] Date of Birth/Sex: Mar 12, 1941 (77 y.o. Male) Treating RN: Ahmed Prima Primary Care Provider: Deland Pretty Other Clinician: Referring Provider: Deland Pretty Treating Provider/Extender: Cathie Olden in Treatment: 0 Information Obtained from: Patient Chief Complaint He is here for evaluation of RLE blisters Electronic Signature(s) Signed: 07/02/2017 1:08:06 PM By: Lawanda Cousins Entered By: Lawanda Cousins on 07/02/2017 13:08:06 RAEL, YO ([id number]) -------------------------------------------------------------------------------- HPI Details Patient Name: [name] Date of Service: 07/02/2017 9:45 AM Medical Record Number: [id number] Patient Account Number: [id number] Date of Birth/Sex: [birth year] (77 y.o. Male) Treating RN: Ahmed Prima Primary Care Provider: Deland Pretty Other Clinician: Referring Provider: Deland Pretty Treating Provider/Extender: Cathie Olden in Treatment: 0 History of Present Illness Location: RLE Quality: no pain Duration: +/-1week Context: secondary to edema HPI Description: 07/02/17-he is here for initial dilation of multiple blisters of the right lower extremity. He is status post right [facility] 2/25 for arthritis. He stated postoperatively he did have thigh-high TED hose on bilaterally until discharged. He has had progressive swelling to his bilateral lower extremities, right more so than left. He has developed multiple blisters to the anterior aspect of his right lower extremity. He was evaluated on Monday at the emergency department in [facility]. DVT study was negative, he was discharged on Ace wrap compression and a three-day increase in his Lasix. He has been applying daily compression therapy since. His echo  in 09/2016 with EF 50-55%. Large anterior bullae was drained of serous fluid, no concern for infection. Will treat with bilateral lower extremity compression wrap; anticipate this to be significantly improved at next week's appointment and can then be measured for long-term compression therapy. Electronic Signature(s) Signed: 07/02/2017 2:29:34 PM By: Lawanda Cousins Previous Signature: 07/02/2017 1:24:16 PM Version By: Lawanda Cousins Entered By: Lawanda Cousins on 07/02/2017 14:29:33 KITAI, PURDOM ([id number]) -------------------------------------------------------------------------------- Physical Exam Details Patient Name: [name] Date of Service: 07/02/2017 9:45 AM Medical Record Number: [id number] Patient Account Number: [id number] Date of Birth/Sex: [birth year] (77 y.o. Male) Treating RN: Ahmed Prima Primary Care Provider: Deland Pretty Other Clinician: Referring Provider: Deland Pretty Treating Provider/Extender: Lawanda Cousins Weeks in Treatment: 0 Respiratory non-labored. expiratory wheeze. Cardiovascular s1 s2 regular, rate controlled. RLE- palpable DP, PT. BLE edema (R>L), warm to touch, faint hemosiderin staining. Musculoskeletal ambulates with walker assistance. Psychiatric does appear to have good insight or judgement to medical needs. oriented x4. calm, cooperative. Electronic Signature(s) Signed: 07/02/2017 2:30:43 PM By: Lawanda Cousins Entered By: Lawanda Cousins on 07/02/2017 14:30:42 [name], [name] ([id number]) -------------------------------------------------------------------------------- Physician Orders Details Patient Name: [name] Date of Service: 07/02/2017 9:45 AM Medical Record Number: [id number] Patient Account Number: [id number] Date of Birth/Sex: 21-Feb-1941 (77 y.o. Male) Treating RN: Ahmed Prima Primary Care Provider: Deland Pretty Other Clinician: Referring Provider: Deland Pretty Treating Provider/Extender: Cathie Olden in  Treatment: 0 Verbal / Phone Orders: Yes ClinicianCarolyne Fiscal, Debi Read Back and Verified: Yes Diagnosis Coding Wound Cleansing o Clean wound with Normal Saline. o Cleanse wound with mild soap and water o May shower with protection. Anesthetic (add to Medication List) o Topical Lidocaine 4% cream applied to wound bed prior to debridement (In Clinic Only). Primary Wound Dressing o Foam - right anterior lower leg o Other: - right anterior leg cover with mepitel one over the wounds then place the foam down Secondary Dressing   osteomyelitis (bone infection)o No Have you had any tests for circulation on your legso No Have you had other problems associated with your woundso Swelling Constitutional Symptoms (General Health) Complaints and Symptoms: No  Complaints or Symptoms Eyes Complaints and Symptoms: No Complaints or Symptoms Medical History: Negative for: Cataracts; Glaucoma; Optic Neuritis Ear/Nose/Mouth/Throat Complaints and Symptoms: No Complaints or Symptoms Medical History: Negative for: Chronic sinus problems/congestion; Middle ear problems Hematologic/Lymphatic Complaints and Symptoms: No Complaints or Symptoms Respiratory Complaints and Symptoms: No Complaints or Symptoms Medical History: Positive for: Chronic Obstructive Pulmonary Disease (COPD); Sleep Apnea - CPAP Cardiovascular Complaints and Symptoms: No Complaints or Symptoms Medical History: Positive for: Coronary Artery Disease; Hypertension Past Medical History Notes: [name], [name]. ([id number]) hyperlipidemia Gastrointestinal Complaints and Symptoms: No Complaints or Symptoms Medical History: Negative for: Cirrhosis ; Colitis; Crohnos; Hepatitis A; Hepatitis B; Hepatitis C Past Medical History Notes: barrett's esophagus Endocrine Complaints and Symptoms: No Complaints or Symptoms Medical History: Negative for: Type I Diabetes; Type II Diabetes Genitourinary Complaints and Symptoms: No Complaints or Symptoms Immunological Complaints and Symptoms: No Complaints or Symptoms Integumentary (Skin) Complaints and Symptoms: No Complaints or Symptoms Musculoskeletal Complaints and Symptoms: No Complaints or Symptoms Medical History: Positive for: Osteoarthritis Neurologic Complaints and Symptoms: No Complaints or Symptoms Medical History: Negative for: Dementia; Neuropathy Oncologic Complaints and Symptoms: No Complaints or Symptoms Medical History: Positive for: Received Radiation Negative for: Received Chemotherapy Past Medical History Notes: [name], [name] ([id number]) radiation as a child on forehead Psychiatric Complaints and Symptoms: No Complaints or Symptoms Immunizations Pneumococcal Vaccine: Received Pneumococcal  Vaccination: Yes Immunization Notes: up to date Implantable Devices Family and Social History Cancer: Yes - Father,Maternal Grandparents; Diabetes: No; Heart Disease: No; Hereditary Spherocytosis: No; Hypertension: No; Kidney Disease: No; Lung Disease: Yes - Mother; Seizures: No; Stroke: Yes - Paternal Grandparents; Thyroid Problems: No; Tuberculosis: No; Former smoker - quit 33 years ago; Marital Status - Married; Alcohol Use: Moderate; Drug Use: No History; Caffeine Use: Daily; Financial Concerns: No; Food, Clothing or Shelter Needs: No; Support System Lacking: No; Transportation Concerns: No; Advanced Directives: No; Patient does not want information on Advanced Directives Electronic Signature(s) Signed: 07/02/2017 5:02:17 PM By: Montey Hora Signed: 07/02/2017 5:22:04 PM By: Lawanda Cousins Entered By: Montey Hora on 07/02/2017 10:11:01 [name], [name] ([id number]) -------------------------------------------------------------------------------- SuperBill Details Patient Name: [name] Date of Service: 07/02/2017 Medical Record Number: [id number] Patient Account Number: [id number] Date of Birth/Sex: July 17, 1940 (77 y.o. Male) Treating RN: Ahmed Prima Primary Care Provider: Deland Pretty Other Clinician: Referring Provider: Deland Pretty Treating Provider/Extender: Cathie Olden in Treatment: 0 Diagnosis Coding ICD-10 Codes Code Description R60.9 Edema, unspecified S80.821S Blister (nonthermal), right lower leg, sequela Facility Procedures CPT4 Code: [id number] Description: 34742 - WOUND CARE VISIT-LEV 4 EST PT Modifier: Quantity: 1 Physician Procedures CPT4 Code: 5956387 Description: WC PHYS LEVEL 3 o NEW PT ICD-10 Diagnosis Description R60.9 Edema, unspecified S80.821S Blister (nonthermal), right lower leg, sequela Modifier: Quantity: 1 Electronic Signature(s) Signed: 07/02/2017 3:16:10 PM By: Alric Quan Signed: 07/02/2017 5:22:04 PM By: Lawanda Cousins Previous Signature: 07/02/2017 2:32:09 PM Version By: Lawanda Cousins Entered By: Alric Quan on 07/02/2017 15:16:10  o ABD pad - right anterior leg o XtraSorb - right anterior leg Dressing Change Frequency o Dressing is to be changed Monday and Thursday. - HHRN to change on Monday (bilateral legs) Follow-up Appointments o Return Appointment in 1 week. Edema Control o 3 Layer Compression System - Bilateral - unna to anchor (bilateral legs) Additional Orders / Instructions o Increase protein intake. [facility] - Nursing---bilateral legs o Continue Home Health Visits o Home Health Nurse may visit PRN to address patientos wound care needs. o FACE TO FACE ENCOUNTER: MEDICARE and MEDICAID PATIENTS: I certify that this patient is under my care and that I had a face-to-face encounter that meets the physician face-to-face encounter requirements with this patient on this date. The encounter with the patient was in whole or in part for the following MEDICAL CONDITION: (primary reason for [place]) MEDICAL NECESSITY: I certify, that based on my findings, NURSING services are a medically necessary home health service. HOME BOUND STATUS: I certify that my clinical findings support that this patient is homebound (i.e., Due to illness or injury, pt requires aid of supportive devices such as crutches, cane, wheelchairs, walkers, the use of special transportation or the assistance of another person to leave their  place of residence. There is a normal inability to leave the home and doing so requires considerable and taxing effort. Other absences are for medical reasons / religious services and are infrequent or of short duration when for other reasons). o If current dressing causes regression in wound condition, may D/C ordered dressing product/s and apply Normal Saline Moist Dressing daily until next Hermann / Other MD appointment. Green of regression in wound condition at [phone]. o Please direct any NON-WOUND related issues/requests for orders to patient's Primary Care Physician Patient Medications [name], [name] ([id number]) Allergies: No Known Allergies Notifications Medication Indication Start End lidocaine DOSE 1 - topical 4 % cream - 1 cream topical Electronic Signature(s) Signed: 07/02/2017 4:33:49 PM By: Alric Quan Signed: 07/02/2017 5:22:04 PM By: Lawanda Cousins Entered By: Alric Quan on 07/02/2017 10:59:38 [name], [name] ([id number]) -------------------------------------------------------------------------------- Prescription 07/02/2017 Patient Name: [name] Provider: Lawanda Cousins NP Date of Birth: October 17, 1940 NPI#: [id number] Sex: M DEA#: [id number] Phone #: [phone] License #: Patient Address: [place], [place] [address] [address], [place], [place] [address] [phone] Allergies No Known Allergies Medication Medication: Route: Strength: Form: lidocaine 4 % topical cream topical 4% cream Class: TOPICAL LOCAL ANESTHETICS Dose: Frequency / Time: Indication: 1 1 cream topical Number of Refills: Number of Units: 0 Generic Substitution: Start Date: End Date: One Time Use: Substitution Permitted No Note to Pharmacy: Signature(s): Date(s): Electronic Signature(s) Signed: 07/02/2017 4:33:49 PM  By: Alric Quan Signed: 07/02/2017 5:22:04 PM By: Lawanda Cousins Entered By: Alric Quan on 07/02/2017 10:59:39 [place], [name] ([id number]) --------------------------------------------------------------------------------  Problem List Details Patient Name: [name] Date of Service: 07/02/2017 9:45 AM Medical Record Number: [id number] Patient Account Number: [id number] Date of Birth/Sex: Jul 29, 1940 (77 y.o. Male) Treating RN: Ahmed Prima Primary Care Provider: Deland Pretty Other Clinician: Referring Provider: Deland Pretty Treating Provider/Extender: Cathie Olden in Treatment: 0 Active Problems ICD-10 Encounter Code Description Active Date Diagnosis R60.9 Edema, unspecified 07/02/2017 Yes S80.821S Blister (nonthermal), right lower leg, sequela 07/02/2017 Yes Inactive Problems Resolved Problems Electronic Signature(s) Signed: 07/02/2017 1:03:22 PM By: Lawanda Cousins Entered By: Rene Kocher,  LAVELL, SUPPLE ([id number]) Visit Report for 07/02/2017 Chief Complaint Document Details Patient Name: [name], [name] Date of Service: 07/02/2017 9:45 AM Medical Record Number: [id number] Patient Account Number: [id number] Date of Birth/Sex: Mar 12, 1941 (77 y.o. Male) Treating RN: Ahmed Prima Primary Care Provider: Deland Pretty Other Clinician: Referring Provider: Deland Pretty Treating Provider/Extender: Cathie Olden in Treatment: 0 Information Obtained from: Patient Chief Complaint He is here for evaluation of RLE blisters Electronic Signature(s) Signed: 07/02/2017 1:08:06 PM By: Lawanda Cousins Entered By: Lawanda Cousins on 07/02/2017 13:08:06 RAEL, YO ([id number]) -------------------------------------------------------------------------------- HPI Details Patient Name: [name] Date of Service: 07/02/2017 9:45 AM Medical Record Number: [id number] Patient Account Number: [id number] Date of Birth/Sex: [birth year] (77 y.o. Male) Treating RN: Ahmed Prima Primary Care Provider: Deland Pretty Other Clinician: Referring Provider: Deland Pretty Treating Provider/Extender: Cathie Olden in Treatment: 0 History of Present Illness Location: RLE Quality: no pain Duration: +/-1week Context: secondary to edema HPI Description: 07/02/17-he is here for initial dilation of multiple blisters of the right lower extremity. He is status post right [facility] 2/25 for arthritis. He stated postoperatively he did have thigh-high TED hose on bilaterally until discharged. He has had progressive swelling to his bilateral lower extremities, right more so than left. He has developed multiple blisters to the anterior aspect of his right lower extremity. He was evaluated on Monday at the emergency department in [facility]. DVT study was negative, he was discharged on Ace wrap compression and a three-day increase in his Lasix. He has been applying daily compression therapy since. His echo  in 09/2016 with EF 50-55%. Large anterior bullae was drained of serous fluid, no concern for infection. Will treat with bilateral lower extremity compression wrap; anticipate this to be significantly improved at next week's appointment and can then be measured for long-term compression therapy. Electronic Signature(s) Signed: 07/02/2017 2:29:34 PM By: Lawanda Cousins Previous Signature: 07/02/2017 1:24:16 PM Version By: Lawanda Cousins Entered By: Lawanda Cousins on 07/02/2017 14:29:33 KITAI, PURDOM ([id number]) -------------------------------------------------------------------------------- Physical Exam Details Patient Name: [name] Date of Service: 07/02/2017 9:45 AM Medical Record Number: [id number] Patient Account Number: [id number] Date of Birth/Sex: [birth year] (77 y.o. Male) Treating RN: Ahmed Prima Primary Care Provider: Deland Pretty Other Clinician: Referring Provider: Deland Pretty Treating Provider/Extender: Lawanda Cousins Weeks in Treatment: 0 Respiratory non-labored. expiratory wheeze. Cardiovascular s1 s2 regular, rate controlled. RLE- palpable DP, PT. BLE edema (R>L), warm to touch, faint hemosiderin staining. Musculoskeletal ambulates with walker assistance. Psychiatric does appear to have good insight or judgement to medical needs. oriented x4. calm, cooperative. Electronic Signature(s) Signed: 07/02/2017 2:30:43 PM By: Lawanda Cousins Entered By: Lawanda Cousins on 07/02/2017 14:30:42 [name], [name] ([id number]) -------------------------------------------------------------------------------- Physician Orders Details Patient Name: [name] Date of Service: 07/02/2017 9:45 AM Medical Record Number: [id number] Patient Account Number: [id number] Date of Birth/Sex: 21-Feb-1941 (77 y.o. Male) Treating RN: Ahmed Prima Primary Care Provider: Deland Pretty Other Clinician: Referring Provider: Deland Pretty Treating Provider/Extender: Cathie Olden in  Treatment: 0 Verbal / Phone Orders: Yes ClinicianCarolyne Fiscal, Debi Read Back and Verified: Yes Diagnosis Coding Wound Cleansing o Clean wound with Normal Saline. o Cleanse wound with mild soap and water o May shower with protection. Anesthetic (add to Medication List) o Topical Lidocaine 4% cream applied to wound bed prior to debridement (In Clinic Only). Primary Wound Dressing o Foam - right anterior lower leg o Other: - right anterior leg cover with mepitel one over the wounds then place the foam down Secondary Dressing 

## 2017-07-07 ENCOUNTER — Ambulatory Visit
Admission: RE | Admit: 2017-07-07 | Discharge: 2017-07-07 | Disposition: A | Discharge status: Home / Self Care | Payer: Medicare PPO | Source: Ambulatory Visit | Attending: Orthopedic Surgery | Admitting: Orthopedic Surgery

## 2017-07-07 ENCOUNTER — Other Ambulatory Visit: Payer: Self-pay | Admitting: Orthopedic Surgery

## 2017-07-07 DIAGNOSIS — M25551 Pain in right hip: Secondary | ICD-10-CM

## 2017-07-08 ENCOUNTER — Ambulatory Visit: Payer: Self-pay | Admitting: Orthopedic Surgery

## 2017-07-09 ENCOUNTER — Encounter: Payer: Medicare PPO | Admitting: Physician Assistant

## 2017-07-09 ENCOUNTER — Other Ambulatory Visit
Admission: RE | Admit: 2017-07-09 | Discharge: 2017-07-09 | Disposition: A | Discharge status: Home / Self Care | Payer: Medicare PPO | Source: Ambulatory Visit | Attending: Physician Assistant | Admitting: Physician Assistant

## 2017-07-09 ENCOUNTER — Encounter (HOSPITAL_COMMUNITY): Payer: Self-pay | Admitting: *Deleted

## 2017-07-09 ENCOUNTER — Other Ambulatory Visit: Payer: Self-pay

## 2017-07-09 DIAGNOSIS — B9689 Other specified bacterial agents as the cause of diseases classified elsewhere: Secondary | ICD-10-CM | POA: Diagnosis not present

## 2017-07-09 DIAGNOSIS — L0889 Other specified local infections of the skin and subcutaneous tissue: Secondary | ICD-10-CM | POA: Insufficient documentation

## 2017-07-09 DIAGNOSIS — L089 Local infection of the skin and subcutaneous tissue, unspecified: Secondary | ICD-10-CM | POA: Diagnosis present

## 2017-07-09 DIAGNOSIS — L03115 Cellulitis of right lower limb: Secondary | ICD-10-CM | POA: Diagnosis not present

## 2017-07-09 MED ORDER — VANCOMYCIN HCL 10 G IV SOLR
1500.0000 mg | INTRAVENOUS | Status: AC
  Administered 2017-07-10: 1500 mg via INTRAVENOUS
  Filled 2017-07-09: qty 1500

## 2017-07-09 MED ORDER — TRANEXAMIC ACID 1000 MG/10ML IV SOLN
1000.0000 mg | INTRAVENOUS | Status: DC
  Filled 2017-07-09: qty 10

## 2017-07-09 NOTE — Progress Notes (Signed)
Spoke with pt for pre-op call. Pt just had surgery end of February. Pt states nothing has changed with his medical and surgical history. Medications have updated by pharmacy.

## 2017-07-10 ENCOUNTER — Encounter (HOSPITAL_COMMUNITY)
Admission: RE | Disposition: A | Discharge status: Home Health | Payer: Self-pay | Source: Ambulatory Visit | Attending: Orthopedic Surgery

## 2017-07-10 ENCOUNTER — Encounter (HOSPITAL_COMMUNITY): Payer: Self-pay

## 2017-07-10 ENCOUNTER — Inpatient Hospital Stay (HOSPITAL_COMMUNITY): Payer: Medicare PPO | Admitting: Anesthesiology

## 2017-07-10 ENCOUNTER — Inpatient Hospital Stay (HOSPITAL_COMMUNITY): Payer: Medicare PPO

## 2017-07-10 ENCOUNTER — Inpatient Hospital Stay (HOSPITAL_COMMUNITY)
Admission: RE | Admit: 2017-07-10 | Discharge: 2017-07-13 | DRG: 468 | Disposition: A | Discharge status: Home Health | Payer: Medicare PPO | Source: Ambulatory Visit | Attending: Orthopedic Surgery | Admitting: Orthopedic Surgery

## 2017-07-10 DIAGNOSIS — Z96649 Presence of unspecified artificial hip joint: Secondary | ICD-10-CM

## 2017-07-10 DIAGNOSIS — N400 Enlarged prostate without lower urinary tract symptoms: Secondary | ICD-10-CM | POA: Diagnosis present

## 2017-07-10 DIAGNOSIS — K219 Gastro-esophageal reflux disease without esophagitis: Secondary | ICD-10-CM | POA: Diagnosis present

## 2017-07-10 DIAGNOSIS — Z955 Presence of coronary angioplasty implant and graft: Secondary | ICD-10-CM | POA: Diagnosis not present

## 2017-07-10 DIAGNOSIS — T84090A Other mechanical complication of internal right hip prosthesis, initial encounter: Principal | ICD-10-CM | POA: Diagnosis present

## 2017-07-10 DIAGNOSIS — E785 Hyperlipidemia, unspecified: Secondary | ICD-10-CM | POA: Diagnosis present

## 2017-07-10 DIAGNOSIS — G4730 Sleep apnea, unspecified: Secondary | ICD-10-CM | POA: Diagnosis present

## 2017-07-10 DIAGNOSIS — E039 Hypothyroidism, unspecified: Secondary | ICD-10-CM | POA: Diagnosis present

## 2017-07-10 DIAGNOSIS — I2510 Atherosclerotic heart disease of native coronary artery without angina pectoris: Secondary | ICD-10-CM | POA: Diagnosis present

## 2017-07-10 DIAGNOSIS — Z87891 Personal history of nicotine dependence: Secondary | ICD-10-CM | POA: Diagnosis not present

## 2017-07-10 DIAGNOSIS — Y792 Prosthetic and other implants, materials and accessory orthopedic devices associated with adverse incidents: Secondary | ICD-10-CM | POA: Diagnosis present

## 2017-07-10 DIAGNOSIS — Z9181 History of falling: Secondary | ICD-10-CM

## 2017-07-10 DIAGNOSIS — E669 Obesity, unspecified: Secondary | ICD-10-CM | POA: Diagnosis present

## 2017-07-10 DIAGNOSIS — I10 Essential (primary) hypertension: Secondary | ICD-10-CM | POA: Diagnosis present

## 2017-07-10 DIAGNOSIS — Z6835 Body mass index (BMI) 35.0-35.9, adult: Secondary | ICD-10-CM

## 2017-07-10 DIAGNOSIS — T84018A Broken internal joint prosthesis, other site, initial encounter: Secondary | ICD-10-CM

## 2017-07-10 DIAGNOSIS — J449 Chronic obstructive pulmonary disease, unspecified: Secondary | ICD-10-CM | POA: Diagnosis present

## 2017-07-10 DIAGNOSIS — Z419 Encounter for procedure for purposes other than remedying health state, unspecified: Secondary | ICD-10-CM

## 2017-07-10 DIAGNOSIS — Z09 Encounter for follow-up examination after completed treatment for conditions other than malignant neoplasm: Secondary | ICD-10-CM

## 2017-07-10 DIAGNOSIS — M25551 Pain in right hip: Secondary | ICD-10-CM | POA: Diagnosis present

## 2017-07-10 HISTORY — PX: TOTAL HIP REVISION: SHX763

## 2017-07-10 LAB — TYPE AND SCREEN
ABO/RH(D): O NEG
Antibody Screen: NEGATIVE

## 2017-07-10 SURGERY — TOTAL HIP REVISION
Anesthesia: Spinal | Laterality: Right

## 2017-07-10 MED ORDER — KETOROLAC TROMETHAMINE 15 MG/ML IJ SOLN
7.5000 mg | Freq: Four times a day (QID) | INTRAMUSCULAR | Status: AC
  Administered 2017-07-10 – 2017-07-11 (×3): 7.5 mg via INTRAVENOUS
  Filled 2017-07-10 (×3): qty 1

## 2017-07-10 MED ORDER — EPHEDRINE SULFATE 50 MG/ML IJ SOLN
INTRAMUSCULAR | Status: DC | PRN
  Administered 2017-07-10: 5 mg via INTRAVENOUS

## 2017-07-10 MED ORDER — SODIUM CHLORIDE 0.9 % IV SOLN
INTRAVENOUS | Status: DC
  Administered 2017-07-10: 18:00:00 via INTRAVENOUS

## 2017-07-10 MED ORDER — DOCUSATE SODIUM 100 MG PO CAPS
100.0000 mg | ORAL_CAPSULE | Freq: Two times a day (BID) | ORAL | Status: DC
  Administered 2017-07-10 – 2017-07-12 (×5): 100 mg via ORAL
  Filled 2017-07-10 (×6): qty 1

## 2017-07-10 MED ORDER — APIXABAN 2.5 MG PO TABS
2.5000 mg | ORAL_TABLET | Freq: Two times a day (BID) | ORAL | Status: DC
  Administered 2017-07-11 – 2017-07-13 (×5): 2.5 mg via ORAL
  Filled 2017-07-10 (×5): qty 1

## 2017-07-10 MED ORDER — NITROGLYCERIN 0.4 MG/SPRAY TL SOLN: 1.0000 | Status: DC | PRN

## 2017-07-10 MED ORDER — METHOCARBAMOL 1000 MG/10ML IJ SOLN
500.0000 mg | Freq: Four times a day (QID) | INTRAMUSCULAR | Status: DC | PRN
  Filled 2017-07-10: qty 5

## 2017-07-10 MED ORDER — ONDANSETRON HCL 4 MG/2ML IJ SOLN
INTRAMUSCULAR | Status: DC | PRN
  Administered 2017-07-10: 4 mg via INTRAVENOUS

## 2017-07-10 MED ORDER — FENTANYL CITRATE (PF) 100 MCG/2ML IJ SOLN
INTRAMUSCULAR | Status: DC | PRN
  Administered 2017-07-10 (×4): 25 ug via INTRAVENOUS
  Administered 2017-07-10: 50 ug via INTRAVENOUS
  Administered 2017-07-10 (×2): 25 ug via INTRAVENOUS

## 2017-07-10 MED ORDER — MENTHOL 3 MG MT LOZG: 1.0000 | LOZENGE | OROMUCOSAL | Status: DC | PRN

## 2017-07-10 MED ORDER — HYDROCODONE-ACETAMINOPHEN 5-325 MG PO TABS
1.0000 | ORAL_TABLET | ORAL | Status: DC | PRN
  Administered 2017-07-10 – 2017-07-13 (×9): 2 via ORAL
  Filled 2017-07-10 (×10): qty 2

## 2017-07-10 MED ORDER — ACETAMINOPHEN 325 MG PO TABS
325.0000 mg | ORAL_TABLET | Freq: Four times a day (QID) | ORAL | Status: DC | PRN
  Administered 2017-07-12: 325 mg via ORAL
  Administered 2017-07-12 – 2017-07-13 (×3): 650 mg via ORAL
  Filled 2017-07-10: qty 1
  Filled 2017-07-10 (×3): qty 2

## 2017-07-10 MED ORDER — CYCLOSPORINE 0.05 % OP EMUL
1.0000 [drp] | Freq: Two times a day (BID) | OPHTHALMIC | Status: DC
  Administered 2017-07-10 – 2017-07-13 (×6): 1 [drp] via OPHTHALMIC
  Filled 2017-07-10 (×7): qty 1

## 2017-07-10 MED ORDER — PHENOL 1.4 % MT LIQD: 1.0000 | OROMUCOSAL | Status: DC | PRN

## 2017-07-10 MED ORDER — LACTATED RINGERS IV SOLN
INTRAVENOUS | Status: DC | PRN
  Administered 2017-07-10 (×2): via INTRAVENOUS

## 2017-07-10 MED ORDER — PROPOFOL 10 MG/ML IV BOLUS
INTRAVENOUS | Status: AC
  Filled 2017-07-10: qty 20

## 2017-07-10 MED ORDER — SODIUM CHLORIDE 0.9% FLUSH
INTRAVENOUS | Status: DC | PRN
  Administered 2017-07-10: 30 mL

## 2017-07-10 MED ORDER — DOXYCYCLINE HYCLATE 100 MG PO TABS
100.0000 mg | ORAL_TABLET | Freq: Two times a day (BID) | ORAL | Status: DC
  Administered 2017-07-10 – 2017-07-13 (×6): 100 mg via ORAL
  Filled 2017-07-10 (×6): qty 1

## 2017-07-10 MED ORDER — ALBUMIN HUMAN 5 % IV SOLN
INTRAVENOUS | Status: DC | PRN
  Administered 2017-07-10: 12:00:00 via INTRAVENOUS

## 2017-07-10 MED ORDER — BUPIVACAINE IN DEXTROSE 0.75-8.25 % IT SOLN
INTRATHECAL | Status: DC | PRN
  Administered 2017-07-10: 2 mL via INTRATHECAL

## 2017-07-10 MED ORDER — LORATADINE 10 MG PO TABS
10.0000 mg | ORAL_TABLET | Freq: Every day | ORAL | Status: DC
  Administered 2017-07-11 – 2017-07-13 (×3): 10 mg via ORAL
  Filled 2017-07-10 (×3): qty 1

## 2017-07-10 MED ORDER — ACETAMINOPHEN 10 MG/ML IV SOLN
1000.0000 mg | INTRAVENOUS | Status: AC
  Administered 2017-07-10: 1000 mg via INTRAVENOUS
  Filled 2017-07-10: qty 100

## 2017-07-10 MED ORDER — METHOCARBAMOL 500 MG PO TABS
500.0000 mg | ORAL_TABLET | Freq: Four times a day (QID) | ORAL | Status: DC | PRN
  Administered 2017-07-10 – 2017-07-13 (×5): 500 mg via ORAL
  Filled 2017-07-10 (×5): qty 1

## 2017-07-10 MED ORDER — ONDANSETRON HCL 4 MG/2ML IJ SOLN: 4.0000 mg | Freq: Once | INTRAMUSCULAR | Status: DC | PRN

## 2017-07-10 MED ORDER — PROPOFOL 500 MG/50ML IV EMUL
INTRAVENOUS | Status: DC | PRN
  Administered 2017-07-10: 50 ug/kg/min via INTRAVENOUS

## 2017-07-10 MED ORDER — DEXAMETHASONE SODIUM PHOSPHATE 10 MG/ML IJ SOLN
INTRAMUSCULAR | Status: DC | PRN
  Administered 2017-07-10: 10 mg via INTRAVENOUS

## 2017-07-10 MED ORDER — ATORVASTATIN CALCIUM 40 MG PO TABS
40.0000 mg | ORAL_TABLET | Freq: Every evening | ORAL | Status: DC
  Administered 2017-07-10 – 2017-07-12 (×3): 40 mg via ORAL
  Filled 2017-07-10 (×3): qty 1

## 2017-07-10 MED ORDER — LACTATED RINGERS IV SOLN
INTRAVENOUS | Status: DC
  Administered 2017-07-10: 09:00:00 via INTRAVENOUS

## 2017-07-10 MED ORDER — SENNA 8.6 MG PO TABS
2.0000 | ORAL_TABLET | Freq: Every day | ORAL | Status: DC
  Administered 2017-07-10 – 2017-07-12 (×3): 17.2 mg via ORAL
  Filled 2017-07-10 (×3): qty 2

## 2017-07-10 MED ORDER — 0.9 % SODIUM CHLORIDE (POUR BTL) OPTIME
TOPICAL | Status: DC | PRN
  Administered 2017-07-10: 1000 mL

## 2017-07-10 MED ORDER — CHLORHEXIDINE GLUCONATE 4 % EX LIQD: 60.0000 mL | Freq: Once | CUTANEOUS | Status: DC

## 2017-07-10 MED ORDER — ONDANSETRON HCL 4 MG PO TABS: 4.0000 mg | ORAL_TABLET | Freq: Four times a day (QID) | ORAL | Status: DC | PRN

## 2017-07-10 MED ORDER — POTASSIUM CHLORIDE CRYS ER 10 MEQ PO TBCR
10.0000 meq | EXTENDED_RELEASE_TABLET | Freq: Every day | ORAL | Status: DC
  Administered 2017-07-12 – 2017-07-13 (×2): 10 meq via ORAL
  Filled 2017-07-10 (×3): qty 1

## 2017-07-10 MED ORDER — AMLODIPINE BESYLATE 5 MG PO TABS
5.0000 mg | ORAL_TABLET | Freq: Every day | ORAL | Status: DC
  Administered 2017-07-11 – 2017-07-13 (×3): 5 mg via ORAL
  Filled 2017-07-10 (×3): qty 1

## 2017-07-10 MED ORDER — DIPHENHYDRAMINE HCL 12.5 MG/5ML PO ELIX: 12.5000 mg | ORAL_SOLUTION | ORAL | Status: DC | PRN

## 2017-07-10 MED ORDER — ONDANSETRON HCL 4 MG/2ML IJ SOLN: 4.0000 mg | Freq: Four times a day (QID) | INTRAMUSCULAR | Status: DC | PRN

## 2017-07-10 MED ORDER — NITROGLYCERIN 0.4 MG SL SUBL: 0.4000 mg | SUBLINGUAL_TABLET | SUBLINGUAL | Status: DC | PRN

## 2017-07-10 MED ORDER — METOCLOPRAMIDE HCL 5 MG PO TABS: 5.0000 mg | ORAL_TABLET | Freq: Three times a day (TID) | ORAL | Status: DC | PRN

## 2017-07-10 MED ORDER — FUROSEMIDE 40 MG PO TABS
40.0000 mg | ORAL_TABLET | Freq: Two times a day (BID) | ORAL | Status: DC
  Administered 2017-07-10 – 2017-07-13 (×6): 40 mg via ORAL
  Filled 2017-07-10 (×6): qty 1

## 2017-07-10 MED ORDER — ALBUTEROL SULFATE (2.5 MG/3ML) 0.083% IN NEBU: 3.0000 mL | INHALATION_SOLUTION | Freq: Four times a day (QID) | RESPIRATORY_TRACT | Status: DC | PRN

## 2017-07-10 MED ORDER — MIDAZOLAM HCL 2 MG/2ML IJ SOLN
INTRAMUSCULAR | Status: AC
  Filled 2017-07-10: qty 2

## 2017-07-10 MED ORDER — ALUM & MAG HYDROXIDE-SIMETH 200-200-20 MG/5ML PO SUSP: 30.0000 mL | ORAL | Status: DC | PRN

## 2017-07-10 MED ORDER — HYDROCODONE-ACETAMINOPHEN 7.5-325 MG PO TABS: 1.0000 | ORAL_TABLET | ORAL | Status: DC | PRN

## 2017-07-10 MED ORDER — PROPOFOL 10 MG/ML IV BOLUS
INTRAVENOUS | Status: DC | PRN
  Administered 2017-07-10 (×2): 10 mg via INTRAVENOUS
  Administered 2017-07-10: 20 mg via INTRAVENOUS

## 2017-07-10 MED ORDER — TRANEXAMIC ACID 1000 MG/10ML IV SOLN
INTRAVENOUS | Status: DC | PRN
  Administered 2017-07-10: 1000 mg via INTRAVENOUS

## 2017-07-10 MED ORDER — FENTANYL CITRATE (PF) 100 MCG/2ML IJ SOLN: 25.0000 ug | INTRAMUSCULAR | Status: DC | PRN

## 2017-07-10 MED ORDER — SODIUM CHLORIDE 0.9 % IV SOLN
INTRAVENOUS | Status: DC | PRN
  Administered 2017-07-10: 50 ug/min via INTRAVENOUS
  Administered 2017-07-10: 40 ug/min via INTRAVENOUS

## 2017-07-10 MED ORDER — METOCLOPRAMIDE HCL 5 MG/ML IJ SOLN: 5.0000 mg | Freq: Three times a day (TID) | INTRAMUSCULAR | Status: DC | PRN

## 2017-07-10 MED ORDER — PANTOPRAZOLE SODIUM 40 MG PO TBEC
40.0000 mg | DELAYED_RELEASE_TABLET | Freq: Every day | ORAL | Status: DC
  Administered 2017-07-11 – 2017-07-13 (×3): 40 mg via ORAL
  Filled 2017-07-10 (×3): qty 1

## 2017-07-10 MED ORDER — KETOROLAC TROMETHAMINE 30 MG/ML IJ SOLN
INTRAMUSCULAR | Status: DC | PRN
  Administered 2017-07-10: 30 mg

## 2017-07-10 MED ORDER — LEVOTHYROXINE SODIUM 25 MCG PO TABS
137.0000 ug | ORAL_TABLET | Freq: Every day | ORAL | Status: DC
  Administered 2017-07-11 – 2017-07-13 (×3): 137 ug via ORAL
  Filled 2017-07-10 (×3): qty 1

## 2017-07-10 MED ORDER — VANCOMYCIN HCL IN DEXTROSE 1-5 GM/200ML-% IV SOLN
1000.0000 mg | Freq: Two times a day (BID) | INTRAVENOUS | Status: AC
  Administered 2017-07-10: 1000 mg via INTRAVENOUS
  Filled 2017-07-10: qty 200

## 2017-07-10 MED ORDER — MORPHINE SULFATE (PF) 2 MG/ML IV SOLN: 0.5000 mg | INTRAVENOUS | Status: DC | PRN

## 2017-07-10 MED ORDER — BUPIVACAINE-EPINEPHRINE (PF) 0.5% -1:200000 IJ SOLN
INTRAMUSCULAR | Status: DC | PRN
  Administered 2017-07-10: 30 mL

## 2017-07-10 MED ORDER — DEXAMETHASONE SODIUM PHOSPHATE 10 MG/ML IJ SOLN
10.0000 mg | Freq: Once | INTRAMUSCULAR | Status: AC
  Administered 2017-07-11: 10 mg via INTRAVENOUS
  Filled 2017-07-10: qty 1

## 2017-07-10 MED ORDER — FENTANYL CITRATE (PF) 250 MCG/5ML IJ SOLN
INTRAMUSCULAR | Status: AC
  Filled 2017-07-10: qty 5

## 2017-07-10 MED ORDER — BUPIVACAINE-EPINEPHRINE (PF) 0.5% -1:200000 IJ SOLN
INTRAMUSCULAR | Status: AC
  Filled 2017-07-10: qty 30

## 2017-07-10 MED ORDER — SODIUM CHLORIDE 0.9 % IR SOLN
Status: DC | PRN
  Administered 2017-07-10: 3000 mL

## 2017-07-10 MED ORDER — SULFAMETHOXAZOLE-TRIMETHOPRIM 800-160 MG PO TABS
1.0000 | ORAL_TABLET | Freq: Two times a day (BID) | ORAL | Status: DC
  Administered 2017-07-10 – 2017-07-13 (×6): 1 via ORAL
  Filled 2017-07-10 (×6): qty 1

## 2017-07-10 MED ORDER — POLYETHYLENE GLYCOL 3350 17 G PO PACK
17.0000 g | PACK | Freq: Every day | ORAL | Status: DC | PRN
  Administered 2017-07-13: 17 g via ORAL
  Filled 2017-07-10: qty 1

## 2017-07-10 SURGICAL SUPPLY — 59 items
ADH SKN CLS APL DERMABOND .7 (GAUZE/BANDAGES/DRESSINGS) ×1
BAG DECANTER FOR FLEXI CONT (MISCELLANEOUS) ×2 IMPLANT
BANDAGE ELASTIC 6 VELCRO ST LF (GAUZE/BANDAGES/DRESSINGS) ×1 IMPLANT
BLADE SAW SGTL 18X1.27X75 (BLADE) ×2 IMPLANT
CANISTER WOUNDNEG PRESSURE 500 (CANNISTER) ×2 IMPLANT
CHLORAPREP W/TINT 26ML (MISCELLANEOUS) ×3 IMPLANT
COVER SURGICAL LIGHT HANDLE (MISCELLANEOUS) ×2 IMPLANT
DERMABOND ADVANCED (GAUZE/BANDAGES/DRESSINGS) ×1
DERMABOND ADVANCED .7 DNX12 (GAUZE/BANDAGES/DRESSINGS) ×1 IMPLANT
DRAPE C-ARMOR (DRAPES) ×1 IMPLANT
DRAPE HIP W/POCKET STRL (DRAPE) ×2 IMPLANT
DRAPE INCISE IOBAN 66X45 STRL (DRAPES) ×2 IMPLANT
DRAPE INCISE IOBAN 85X60 (DRAPES) ×2 IMPLANT
DRAPE POUCH INSTRU U-SHP 10X18 (DRAPES) ×2 IMPLANT
DRAPE SURG 17X11 SM STRL (DRAPES) ×2 IMPLANT
DRAPE U-SHAPE 47X51 STRL (DRAPES) ×2 IMPLANT
DRESSING PREVENA PLUS CUSTOM (GAUZE/BANDAGES/DRESSINGS) IMPLANT
DRSG AQUACEL AG ADV 3.5X10 (GAUZE/BANDAGES/DRESSINGS) IMPLANT
DRSG PREVENA PLUS CUSTOM (GAUZE/BANDAGES/DRESSINGS) ×2
DRSG TELFA 3X8 NADH (GAUZE/BANDAGES/DRESSINGS) ×2 IMPLANT
ELECT BLADE TIP CTD 4 INCH (ELECTRODE) ×2 IMPLANT
ELECT REM PT RETURN 9FT ADLT (ELECTROSURGICAL) ×2
ELECTRODE REM PT RTRN 9FT ADLT (ELECTROSURGICAL) ×1 IMPLANT
FACESHIELD WRAPAROUND (MASK) ×8 IMPLANT
FACESHIELD WRAPAROUND OR TEAM (MASK) ×4 IMPLANT
GLOVE BIO SURGEON STRL SZ8.5 (GLOVE) ×2 IMPLANT
GLOVE BIOGEL PI IND STRL 8.5 (GLOVE) ×6 IMPLANT
GLOVE BIOGEL PI INDICATOR 8.5 (GLOVE) ×6
GOWN SPEC L3 XXLG W/TWL (GOWN DISPOSABLE) ×4 IMPLANT
GOWN STRL REUS W/TWL 2XL LVL3 (GOWN DISPOSABLE) ×2 IMPLANT
HANDPIECE INTERPULSE COAX TIP (DISPOSABLE) ×2
KIT BASIN OR (CUSTOM PROCEDURE TRAY) ×2 IMPLANT
KIT DRSG PREVENA PLUS 7DAY 125 (MISCELLANEOUS) ×2 IMPLANT
MANIFOLD NEPTUNE II (INSTRUMENTS) ×2 IMPLANT
NDL SAFETY ECLIPSE 18X1.5 (NEEDLE) ×1 IMPLANT
NEEDLE HYPO 18GX1.5 SHARP (NEEDLE) ×2
NS IRRIG 1000ML POUR BTL (IV SOLUTION) ×2 IMPLANT
PACK TOTAL JOINT (CUSTOM PROCEDURE TRAY) ×2 IMPLANT
SEALER BIPOLAR AQUA 6.0 (INSTRUMENTS) ×2 IMPLANT
SET HNDPC FAN SPRY TIP SCT (DISPOSABLE) ×1 IMPLANT
SPONGE LAP 18X18 RF (DISPOSABLE) ×1 IMPLANT
STEM/HEAD HIP CAPT DEPUY (Hips) ×1 IMPLANT
SUCTION FRAZIER HANDLE 10FR (MISCELLANEOUS) ×1
SUCTION TUBE FRAZIER 10FR DISP (MISCELLANEOUS) ×1 IMPLANT
SUT ETHIBOND 2 V 37 (SUTURE) ×4 IMPLANT
SUT ETHILON 2 0 FS 18 (SUTURE) ×2 IMPLANT
SUT ETHILON 2 0 PSLX (SUTURE) ×8 IMPLANT
SUT MNCRL AB 4-0 PS2 18 (SUTURE) ×2 IMPLANT
SUT MON AB 2-0 CT1 36 (SUTURE) ×5 IMPLANT
SUT VIC AB 1 CT1 27 (SUTURE) ×4
SUT VIC AB 1 CT1 27XBRD ANBCTR (SUTURE) ×1 IMPLANT
SUT VIC AB 2-0 CT1 27 (SUTURE) ×2
SUT VIC AB 2-0 CT1 TAPERPNT 27 (SUTURE) ×1 IMPLANT
SUT VLOC 180 0 24IN GS25 (SUTURE) ×2 IMPLANT
SYR 50ML LL SCALE MARK (SYRINGE) ×2 IMPLANT
TOWEL OR 17X26 10 PK STRL BLUE (TOWEL DISPOSABLE) ×4 IMPLANT
TRAY CATH 16FR W/PLASTIC CATH (SET/KITS/TRAYS/PACK) IMPLANT
TRAY FOLEY CATH SILVER 14FR (SET/KITS/TRAYS/PACK) ×1 IMPLANT
TRAY FOLEY W/METER SILVER 16FR (SET/KITS/TRAYS/PACK) IMPLANT

## 2017-07-10 NOTE — Progress Notes (Signed)
Patient requesting to use CPAP tonight. Patient informed he no orders and MD will be informed

## 2017-07-10 NOTE — Anesthesia Preprocedure Evaluation (Addendum)
Anesthesia Evaluation  Patient identified by MRN, date of birth, ID band Patient awake    Reviewed: Allergy & Precautions, NPO status , Patient's Chart, lab work & pertinent test results  Airway Mallampati: I  TM Distance: >3 FB Neck ROM: Full    Dental no notable dental hx.    Pulmonary asthma , sleep apnea and Continuous Positive Airway Pressure Ventilation , COPD,  COPD inhaler, former smoker,    Pulmonary exam normal breath sounds clear to auscultation       Cardiovascular hypertension, + CAD and + Cardiac Stents  Normal cardiovascular exam Rhythm:Regular Rate:Normal  ECG: SR, rate 91. RBBB  ECHO: Left ventricle: The cavity size was normal. Systolic function was at the lower limits of normal. The estimated ejection fraction was in the range of 50% to 55%. Mild diffuse hypokinesis. Doppler parameters are consistent with abnormal left ventricular relaxation (grade 1 diastolic dysfunction). Mitral valve: Calcified annulus. Left atrium: The atrium was moderately dilated.   PCP is Dr. Deland Pretty. Dr. Sid Falcon office reported he cleared patient from a medical standpoint.  Cardiologist is Dr. Kirk Ruths. Last visit with Dario Guardian, PA-C on 05/19/17. He was cleared for surgery. (Update: Patient had routine follow-up with Dr. Stanford Breed on 06/16/17. He also felt patient may proceed with surgery without further cardiac evaluation.)     Neuro/Psych negative neurological ROS  negative psych ROS   GI/Hepatic Neg liver ROS, hiatal hernia, GERD  Medicated and Controlled,  Endo/Other  Hypothyroidism   Renal/GU negative Renal ROS     Musculoskeletal  (+) Arthritis , Osteoarthritis,    Abdominal (+) + obese,   Peds  Hematology  (+) anemia , HLD   Anesthesia Other Findings Failed Right THA  Reproductive/Obstetrics                            Anesthesia Physical Anesthesia Plan  ASA:  III  Anesthesia Plan: Spinal   Post-op Pain Management:    Induction: Intravenous  PONV Risk Score and Plan: 1 and Ondansetron, Propofol infusion and Treatment may vary due to age or medical condition  Airway Management Planned: Natural Airway  Additional Equipment:   Intra-op Plan:   Post-operative Plan:   Informed Consent: I have reviewed the patients History and Physical, chart, labs and discussed the procedure including the risks, benefits and alternatives for the proposed anesthesia with the patient or authorized representative who has indicated his/her understanding and acceptance.   Dental advisory given  Plan Discussed with: CRNA  Anesthesia Plan Comments:         Anesthesia Quick Evaluation

## 2017-07-10 NOTE — Op Note (Signed)
OPERATIVE REPORT   07/10/2017  1:20 PM  PATIENT:  [name]   SURGEON:  Bertram Savin, MD  ASSISTANT: April Green, RNFA.Marland Kitchen   PREOPERATIVE DIAGNOSIS:  Failed Right total hip arthroplasty.  POSTOPERATIVE DIAGNOSIS:  Same.  PROCEDURE: Revision right total hip arthroplasty including femoral component. Placement of incisional wound VAC.  ANESTHESIA:   GETA.  ANTIBIOTICS: 2 g Ancef.  EXPLANTS: DePuy Tri-Lock stem size 7, high offset with 36+1.5 mm ceramic head ball.  IMPLANTS: DePuy AML femoral stem size 16.5 mm large stature with 36 -2 mm metal head ball.  SPECIMENS: Right hip joint fluid for culture.  TUBES AND DRAINS: Prevena incisional wound VAC.  COMPLICATIONS: None.  DISPOSITION: Stable to PACU.  SURGICAL INDICATIONS:  [name] is a 77 y.o. male who underwent primary right total hip arthroplasty via anterior approach approximately 2 weeks ago.  At his routine initial postop visit, x-rays showed that his femoral component had rotated and subsided.  CT scan was obtained, and negative for periprosthetic fracture.  I recommended revision right total hip arthroplasty of the femoral component.  We discussed the risks and benefits of anterior versus posterior approach, the patient desired to proceed with posterior approach.  The risks, benefits, and alternatives were discussed with the patient. There are risks associated with the surgery including, but not limited to, problems with anesthesia (death), infection, instability (giving out of the joint), dislocation, differences in leg length/angulation/rotation, fracture of bones, loosening or failure of implants, hematoma (blood accumulation) which may require surgical drainage, blood clots, pulmonary embolism, nerve injury (foot drop and lateral thigh numbness), and blood vessel injury. The patient understands these risks and elects to proceed.  PROCEDURE IN DETAIL: Identified the patient the preop holding area using 2  identifiers.  The surgical site was marked by myself.  He was taken to the operating room, and spinal anesthesia was obtained.  He was then flipped to the left lateral decubitus position.  All bony prominences were well-padded.  Axillary roll was placed.  I made a standard posterior approach to the hip.  The sciatic nerve was palpated and protected throughout the case.  The short external rotators and capsule were taken down in 1 layer.  There is no purulence or evidence of infection.  I obtain synovial fluid for routine culture.  The hip was dislocated.  The femoral component was in significantly more anteversion and subsided.  I removed the stem easily with a loop extractor.  I examined his anatomy.  Patient had significant femoral anteversion when compared with average.  I freshened the neck cut with a saw.  I reamed up to a 16 mm reamer with excellent cortical contact.  I sequentially broached up to a 16.5 large stature broach.  Trial neck and head were placed and the hip was reduced.  Stability was excellent.  Component placement was confirmed with fluoroscopy.  I removed the trial femoral component.  I examined the proximal bone, and there was no fracture.  The real components were placed and the hip was reduced.  The hip was stable in body position.  In full extension and external rotation there was no impingement.  With neutral abduction and 90 degrees of flexion, I was able to internally rotate approximately 80 degrees without any instability.  The wound was copiously irrigated with normal saline using pulse lavage.  The wound was closed in layers with #1 Vicryl and V lock for the IT band, 2-0 Vicryl for the deep fatty layer,  2-0 Monocryl for the deep dermal layer, 2-0 nylon mattress suture for the skin.  Prevena wound VAC was applied according to manufacturer instructions.  Sponge, needle, and instrument counts were correct at the end of the case x2.  There were no known complications.  POSTOPERATIVE  PLAN: Postoperatively, the patient will be admitted to the hospital.  He may weight-bear as tolerated with a walker.  I will place him on apixaban for DVT prophylaxis.  Posterior hip precautions.  He will work with physical therapy.  He will undergo disposition planning.

## 2017-07-10 NOTE — Progress Notes (Signed)
Mr. [name] admitted to 5N16 from PACU s/p Right total hip replacement. Patient stable on arrival. Vitals signs completed, patient instructed on incentive spirometry use. Assessment in progress.

## 2017-07-10 NOTE — Transfer of Care (Signed)
Immediate Anesthesia Transfer of Care Note  Patient: [name]  Procedure(s) Performed: RIGHT TOTAL HIP REVISION FEMORAL COMPONENT (Right )  Patient Location: PACU  Anesthesia Type:Spinal  Level of Consciousness: awake, alert , oriented and patient cooperative  Airway & Oxygen Therapy: Patient Spontanous Breathing and Patient connected to nasal cannula oxygen  Post-op Assessment: Report given to RN and Post -op Vital signs reviewed and stable  Post vital signs: Reviewed and stable  Last Vitals:  Vitals:   07/10/17 0850  BP: 136/72  Pulse: 85  Resp: 20  Temp: 36.8 C  SpO2: 99%    Last Pain:  Vitals:   07/10/17 0921  TempSrc:   PainSc: 5       Patients Stated Pain Goal: 2 (37/85/88 5027)  Complications: No apparent anesthesia complications

## 2017-07-10 NOTE — H&P (Signed)
TOTAL HIP REVISION ADMISSION H&P  Patient is admitted for right revision total hip arthroplasty.  Subjective:  Chief Complaint: right hip pain  HPI: [name], 77 y.o. male, has a history of pain and functional disability in the right hip due to subsided femoral component  acutely. The indications for the revision total hip arthroplasty are fracture or mechanical failure of one or more component.  Onset of symptoms was abrupt starting 2 weeks years ago with gradually worsening course since that time.  Prior procedures on the right hip include arthroplasty.  Patient currently rates pain in the right hip at 10 out of 10 with activity.  There is worsening of pain with activity and weight bearing, pain that interfers with activities of daily living and pain with passive range of motion. Patient has evidence of prosthetic subsidence by imaging studies.  This condition presents safety issues increasing the risk of falls.    There is no current active infection.  Patient Active Problem List   Diagnosis Date Noted  . Primary osteoarthritis of right hip 06/22/2017  . Osteoarthritis of right hip 06/22/2017  . Pre-operative clearance 05/19/2017  . Weight gain 03/30/2015  . Constipation 12/28/2014  . Bilateral lower extremity edema 12/28/2014  . RBBB 08/14/2014  . Psoriasis 08/14/2014  . Chest pain with high risk of acute coronary syndrome 08/14/2014  . Essential hypertension 08/14/2014  . Unstable angina ([place]) 08/14/2014  . Hoarseness 06/15/2014  . Rash and nonspecific skin eruption 06/15/2014  . Submandibular swelling 05/15/2013  . Asthma with acute exacerbation 08/18/2012  . Eustachian tube dysfunction 04/05/2012  . Allergic rhinitis due to pollen 06/28/2010  . ABDOMINAL BRUIT 03/04/2010  . Dyslipidemia 03/07/2009  . CAD S/P PCI LAD 1987, `1997, 08/15/14 PCI pLAD DES 03/07/2009  . BPH (benign prostatic hyperplasia) 03/07/2009  . Hypothyroidism 03/06/2009  . Allergic-infective asthma  04/06/2007  . Centrilobular emphysema ([place]) 04/06/2007   Past Medical History:  Diagnosis Date  . Allergic asthma   . Barrett's esophagus   . BPH (benign prostatic hyperplasia)   . CAD (coronary artery disease)   . COPD (chronic obstructive pulmonary disease) ([place])   . GERD (gastroesophageal reflux disease)   . Hiatal hernia   . Hyperlipidemia   . Hypothyroidism   . Psoriasis   . Sleep apnea    wears CPAP nightly    Past Surgical History:  Procedure Laterality Date  . COLONOSCOPY  03/2017  . CORONARY ANGIOPLASTY WITH STENT PLACEMENT  08/15/14   PCI of pLAD Xience alpine DES  . EYE SURGERY  01/2017   eyelids lifted  . LEFT HEART CATHETERIZATION WITH CORONARY ANGIOGRAM N/A 08/15/2014   Procedure: LEFT HEART CATHETERIZATION WITH CORONARY ANGIOGRAM;  Surgeon: Peter M Martinique, MD;  Location: [facility] C[facility] LAB;  Service: Cardiovascular;  Laterality: N/A;  . left knee arthroscopy    . NASAL POLYP EXCISION  1953  . NASAL SINUS SURGERY    . TONSILLECTOMY    . TOTAL HIP ARTHROPLASTY Right 06/22/2017   Procedure: RIGHT TOTAL HIP ARTHROPLASTY ANTERIOR APPROACH;  Surgeon: Rod Can, MD;  Location: [facility];  Service: Orthopedics;  Laterality: Right;  Needs RNFA    Current Facility-Administered Medications  Medication Dose Route Frequency Provider Last Rate Last Dose  . acetaminophen (OFIRMEV) IV 1,000 mg  1,000 mg Intravenous To OR Tionne Carelli, Aaron Edelman, MD      . chlorhexidine (HIBICLENS) 4 % liquid 4 application  60 mL Topical Once Rod Can, MD      . lactated ringers  infusion   Intravenous Continuous Ellender, Karyl Kinnier, MD 10 mL/hr at 07/10/17 0911    . tranexamic acid (CYKLOKAPRON) 1,000 mg in sodium chloride 0.9 % 100 mL IVPB  1,000 mg Intravenous To OR Ralynn San, Aaron Edelman, MD      . vancomycin (VANCOCIN) 1,500 mg in sodium chloride 0.9 % 500 mL IVPB  1,500 mg Intravenous To OR Rod Can, MD 250 mL/hr at 07/10/17 0912 1,500 mg at 07/10/17 0912   No Known Allergies  Social History    Tobacco Use  . Smoking status: Former Smoker    Packs/day: 3.00    Years: 33.00    Pack years: 99.00    Types: Pipe, Cigars    Last attempt to quit: 04/28/1985    Years since quitting: 32.2  . Smokeless tobacco: Never Used  Substance Use Topics  . Alcohol use: Yes    Alcohol/week: 1.2 oz    Types: 1 Glasses of wine, 1 Shots of liquor per week    Family History  Problem Relation Age of Onset  . Lung cancer Father   . Dementia Mother   . COPD Mother       Review of Systems  Constitutional: Negative.   HENT: Negative.   Eyes: Negative.   Respiratory: Negative.   Cardiovascular: Positive for leg swelling.  Gastrointestinal: Negative.   Genitourinary: Negative.   Musculoskeletal: Positive for joint pain.  Skin: Positive for rash.  Neurological: Negative.   Endo/Heme/Allergies: Negative.   Psychiatric/Behavioral: Negative.     Objective:  Physical Exam  Vitals reviewed. Constitutional: He is oriented to person, place, and time. He appears well-developed and well-nourished.  HENT:  Head: Normocephalic and atraumatic.  Eyes: Conjunctivae and EOM are normal. Pupils are equal, round, and reactive to light.  Neck: Normal range of motion. Neck supple.  Cardiovascular: Normal rate, regular rhythm and intact distal pulses.  Respiratory: No respiratory distress.  GI: Soft. He exhibits no distension.  Genitourinary:  Genitourinary Comments: deferred  Musculoskeletal:       Right hip: He exhibits decreased range of motion and swelling.       Legs: Has superficial blister on distal 1/3 lower leg with expected edema. Hyperemia resolves on elevation. No necrotic tissue, fluctuance, purulence.   Neurological: He is alert and oriented to person, place, and time. He has normal reflexes.  Skin: Skin is warm and dry.  Psychiatric: He has a normal mood and affect. His behavior is normal. Judgment and thought content normal.    Vital signs in last 24 hours: Temp:  [98.2 F (36.8  C)] 98.2 F (36.8 C) (03/15 0850) Pulse Rate:  [85] 85 (03/15 0850) Resp:  [20] 20 (03/15 0850) BP: (136)/(72) 136/72 (03/15 0850) SpO2:  [99 %] 99 % (03/15 0850) Weight:  [113.4 kg (250 lb)] 113.4 kg (250 lb) (03/15 0851)   Labs:   Estimated body mass index is 35.87 kg/m as calculated from the following:   Height as of this encounter: 5\' 10"  (1.778 m).   Weight as of this encounter: 113.4 kg (250 lb).  Imaging Review:  Plain radiographs demonstrate right total hip arthroplasty. There is evidence of loosening of the femoral stem.The bone quality appears to be adequate for age and reported activity level.  Assessment/Plan:  End stage arthritis, right hip(s) with failed previous arthroplasty.  The patient history, physical examination, clinical judgement of the provider and imaging studies are consistent with end stage degenerative joint disease of the right hip(s), previous total hip arthroplasty. Revision total  hip arthroplasty is deemed medically necessary. The treatment options including medical management, injection therapy, arthroscopy and arthroplasty were discussed at length. The risks and benefits of total hip arthroplasty were presented and reviewed. The risks due to aseptic loosening, infection, stiffness, dislocation/subluxation,  thromboembolic complications and other imponderables were discussed.  The patient acknowledged the explanation, agreed to proceed with the plan and consent was signed. Patient is being admitted for inpatient treatment for surgery, pain control, PT, OT, prophylactic antibiotics, VTE prophylaxis, progressive ambulation and ADL's and discharge planning. The patient is planning to be discharged home with home health services

## 2017-07-10 NOTE — Progress Notes (Signed)
Placed patient on CPAP for the night via auto-mode.  

## 2017-07-10 NOTE — Progress Notes (Signed)
Orthopedic Tech Progress Note Patient Details:  [name] July 27, 1940 [id number]  Patient ID: [name], male   DOB: Sep 19, 1940, 77 y.o.   MRN: [id number] Pt cant have phf due to age restrictions  [name] 07/10/2017, 11:39 PM

## 2017-07-10 NOTE — Anesthesia Procedure Notes (Signed)
Spinal  Patient location during procedure: OR Start time: 07/10/2017 11:05 AM End time: 07/10/2017 11:15 AM Staffing Anesthesiologist: Murvin Natal, MD Performed: anesthesiologist  Preanesthetic Checklist Completed: patient identified, surgical consent, pre-op evaluation, timeout performed, IV checked, risks and benefits discussed and monitors and equipment checked Spinal Block Patient position: sitting Prep: DuraPrep Patient monitoring: cardiac monitor, continuous pulse ox and blood pressure Approach: midline Location: L3-4 Injection technique: single-shot Needle Needle type: Pencan  Needle gauge: 24 G Needle length: 9 cm Assessment Sensory level: T10 Additional Notes Functioning IV was confirmed and monitors were applied. Sterile prep and drape, including hand hygiene and sterile gloves were used. The patient was positioned and the spine was prepped. The skin was anesthetized with lidocaine.  Free flow of clear CSF was obtained on the second attempt prior to injecting local anesthetic into the CSF.  The spinal needle aspirated freely following injection.  The needle was carefully withdrawn.  The patient tolerated the procedure well.

## 2017-07-10 NOTE — Progress Notes (Signed)
Pre assigned to [facility]

## 2017-07-11 LAB — BASIC METABOLIC PANEL
Anion gap: 8 (ref 5–15)
BUN: 10 mg/dL (ref 6–20)
CALCIUM: 8.4 mg/dL — AB (ref 8.9–10.3)
CO2: 23 mmol/L (ref 22–32)
CREATININE: 0.93 mg/dL (ref 0.61–1.24)
Chloride: 105 mmol/L (ref 101–111)
GFR calc Af Amer: >60 mL/min (ref >60)
GLUCOSE: 196 mg/dL — AB (ref 65–99)
Potassium: 4.3 mmol/L (ref 3.5–5.1)
SODIUM: 136 mmol/L (ref 135–145)

## 2017-07-11 LAB — CBC
HCT: 28.3 % — ABNORMAL LOW (ref 39.0–52.0)
Hemoglobin: 9.4 g/dL — ABNORMAL LOW (ref 13.0–17.0)
MCH: 33 pg (ref 26.0–34.0)
MCHC: 33.2 g/dL (ref 30.0–36.0)
MCV: 99.3 fL (ref 78.0–100.0)
PLATELETS: 185 10*3/uL (ref 150–400)
RBC: 2.85 MIL/uL — AB (ref 4.22–5.81)
RDW: 14.7 % (ref 11.5–15.5)
WBC: 7.6 10*3/uL (ref 4.0–10.5)

## 2017-07-11 NOTE — Discharge Instructions (Signed)
° °Dr. Brian Swinteck °Joint Replacement Specialist °[facility] °[address] °[place],  [address] °[phone] ° ° °TOTAL HIP REPLACEMENT POSTOPERATIVE DIRECTIONS ° ° ° °Hip Rehabilitation, Guidelines Following Surgery  ° °WEIGHT BEARING °Weight bearing as tolerated with assist device (walker, cane, etc) as directed, use it as long as suggested by your surgeon or therapist, typically at least 4-6 weeks. ° °The results of a hip operation are greatly improved after range of motion and muscle strengthening exercises. Follow all safety measures which are given to protect your hip. If any of these exercises cause increased pain or swelling in your joint, decrease the amount until you are comfortable again. Then slowly increase the exercises. Call your caregiver if you have problems or questions.  ° °HOME CARE INSTRUCTIONS  °Most of the following instructions are designed to prevent the dislocation of your new hip.  °Remove items at home which could result in a fall. This includes throw rugs or furniture in walking pathways.  °Continue medications as instructed at time of discharge. °· You may have some home medications which will be placed on hold until you complete the course of blood thinner medication. °· You may start showering once you are discharged home. Do not remove your dressing. °Do not put on socks or shoes without following the instructions of your caregivers.   °Sit on chairs with arms. Use the chair arms to help push yourself up when arising.  °Arrange for the use of a toilet seat elevator so you are not sitting low.  °· Walk with walker as instructed.  °You may resume a sexual relationship in one month or when given the OK by your caregiver.  °Use walker as long as suggested by your caregivers.  °You may put full weight on your legs and walk as much as is comfortable. °Avoid periods of inactivity such as sitting longer than an hour when not asleep. This helps prevent  blood clots.  °You may return to work once you are cleared by your surgeon.  °Do not drive a car for 6 weeks or until released by your surgeon.  °Do not drive while taking narcotics.  °Wear elastic stockings for two weeks following surgery during the day but you may remove then at night.  °Make sure you keep all of your appointments after your operation with all of your doctors and caregivers. You should call the office at the above phone number and make an appointment for approximately two weeks after the date of your surgery. °Please pick up a stool softener and laxative for home use as long as you are requiring pain medications. °· ICE to the affected hip every three hours for 30 minutes at a time and then as needed for pain and swelling. Continue to use ice on the hip for pain and swelling from surgery. You may notice swelling that will progress down to the foot and ankle.  This is normal after surgery.  Elevate the leg when you are not up walking on it.   °It is important for you to complete the blood thinner medication as prescribed by your doctor. °· Continue to use the breathing machine which will help keep your temperature down.  It is common for your temperature to cycle up and down following surgery, especially at night when you are not up moving around and exerting yourself.  The breathing machine keeps your lungs expanded and your temperature down. ° °RANGE OF MOTION AND STRENGTHENING EXERCISES  °These exercises   are designed to help you keep full movement of your hip joint. Follow your caregiver's or physical therapist's instructions. Perform all exercises about fifteen times, three times per day or as directed. Exercise both hips, even if you have had only one joint replacement. These exercises can be done on a training (exercise) mat, on the floor, on a table or on a bed. Use whatever works the best and is most comfortable for you. Use music or television while you are exercising so that the exercises  are a pleasant break in your day. This will make your life better with the exercises acting as a break in routine you can look forward to.  Lying on your back, slowly slide your foot toward your buttocks, raising your knee up off the floor. Then slowly slide your foot back down until your leg is straight again.  Lying on your back spread your legs as far apart as you can without causing discomfort.  Lying on your side, raise your upper leg and foot straight up from the floor as far as is comfortable. Slowly lower the leg and repeat.  Lying on your back, tighten up the muscle in the front of your thigh (quadriceps muscles). You can do this by keeping your leg straight and trying to raise your heel off the floor. This helps strengthen the largest muscle supporting your knee.  Lying on your back, tighten up the muscles of your buttocks both with the legs straight and with the knee bent at a comfortable angle while keeping your heel on the floor.   SKILLED REHAB INSTRUCTIONS: If the patient is transferred to a skilled rehab facility following release from the hospital, a list of the current medications will be sent to the facility for the patient to continue.  When discharged from the skilled rehab facility, please have the facility set up the patient's [place] prior to being released. Also, the skilled facility will be responsible for providing the patient with their medications at time of release from the facility to include their pain medication and their blood thinner medication. If the patient is still at the rehab facility at time of the two week follow up appointment, the skilled rehab facility will also need to assist the patient in arranging follow up appointment in our office and any transportation needs.  MAKE SURE YOU:  Understand these instructions.  Will watch your condition.  Will get help right away if you are not doing well or get worse.  Pick up stool softner and  laxative for home use following surgery while on pain medications. Do not remove your dressing. Keep dressing clean and dry. Charge VAC unit nightly. Continue to use ice for pain and swelling after surgery. Do not use any lotions or creams on the incision until instructed by your surgeon. Total Hip Protocol.  Information on my medicine - ELIQUIS (apixaban)  This medication education was reviewed with me or my healthcare representative as part of my discharge preparation.  The pharmacist that spoke with me during my hospital stay was:  Brain Hilts, [facility]  Why was Eliquis prescribed for you? Eliquis was prescribed for you to reduce the risk of blood clots forming after orthopedic surgery.    What do You need to know about Eliquis? Take your Eliquis TWICE DAILY - one tablet in the morning and one tablet in the evening with or without food.  It would be best to take the dose about the same time each  day.  If you have difficulty swallowing the tablet whole please discuss with your pharmacist how to take the medication safely.  Take Eliquis exactly as prescribed by your doctor and DO NOT stop taking Eliquis without talking to the doctor who prescribed the medication.  Stopping without other medication to take the place of Eliquis may increase your risk of developing a clot.  After discharge, you should have regular check-up appointments with your healthcare provider that is prescribing your Eliquis.  What do you do if you miss a dose? If a dose of ELIQUIS is not taken at the scheduled time, take it as soon as possible on the same day and twice-daily administration should be resumed.  The dose should not be doubled to make up for a missed dose.  Do not take more than one tablet of ELIQUIS at the same time.  Important Safety Information A possible side effect of Eliquis is bleeding. You should call your healthcare provider right away if you experience any of the  following: ? Bleeding from an injury or your nose that does not stop. ? Unusual colored urine (red or dark brown) or unusual colored stools (red or black). ? Unusual bruising for unknown reasons. ? A serious fall or if you hit your head (even if there is no bleeding).  Some medicines may interact with Eliquis and might increase your risk of bleeding or clotting while on Eliquis. To help avoid this, consult your healthcare provider or pharmacist prior to using any new prescription or non-prescription medications, including herbals, vitamins, non-steroidal anti-inflammatory drugs (NSAIDs) and supplements.  This website has more information on Eliquis (apixaban): http://www.eliquis.com/eliquis/home

## 2017-07-11 NOTE — Progress Notes (Signed)
Patient comfortable with placing CPAP on himself.  Told to have RT called should he need assistance.

## 2017-07-11 NOTE — Progress Notes (Addendum)
    Subjective: 1 Day Post-Op Procedure(s) (LRB): RIGHT TOTAL HIP REVISION FEMORAL COMPONENT (Right) Patient reports pain as 3 on 0-10 scale.   Denies CP or SOB.  Voiding without difficulty. Positive flatus. Objective: Vital signs in last 24 hours: Temp:  [94.5 F (34.7 C)-98.2 F (36.8 C)] 98.1 F (36.7 C) (03/16 0348) Pulse Rate:  [71-107] 107 (03/16 0348) Resp:  [13-20] 17 (03/15 2348) BP: (103-150)/(61-94) 127/61 (03/16 0348) SpO2:  [95 %-100 %] 96 % (03/16 0348) Weight:  [113.4 kg (250 lb)] 113.4 kg (250 lb) (03/15 0851)  Intake/Output from previous day: 03/15 0701 - 03/16 0700 In: 2470 [P.O.:620; I.V.:1600; IV Piggyback:250] Out: 4100 [Urine:3500; Blood:600] Intake/Output this shift: No intake/output data recorded.  Labs: Recent Labs    07/11/17 0557  HGB 9.4*   Recent Labs    07/11/17 0557  WBC 7.6  RBC 2.85*  HCT 28.3*  PLT 185   Recent Labs    07/11/17 0557  NA 136  K 4.3  CL 105  CO2 23  BUN 10  CREATININE 0.93  GLUCOSE 196*  CALCIUM 8.4*   No results for input(s): LABPT, INR in the last 72 hours.  Physical Exam: Neurologically intact Intact pulses distally Compartment soft Body mass index is 35.87 kg/m.  VAC functioning - dressing intact   Assessment/Plan: 1 Day Post-Op Procedure(s) (LRB): RIGHT TOTAL HIP REVISION FEMORAL COMPONENT (Right) Advance diet Up with therapy No change to plan -  will discuss with Dr Fuller Plan D for Dr. Melina Schools [facility] [phone] 07/11/2017, 8:24 AM

## 2017-07-11 NOTE — Plan of Care (Signed)
  Activity: Risk for activity intolerance will decrease 07/11/2017 1256 - Progressing by Williams Che, RN   Nutrition: Adequate nutrition will be maintained 07/11/2017 1256 - Progressing by Williams Che, RN   Elimination: Will not experience complications related to bowel motility 07/11/2017 1256 - Progressing by Williams Che, RN   Pain Managment: General experience of comfort will improve 07/11/2017 1256 - Progressing by Williams Che, RN   Safety: Ability to remain free from injury will improve 07/11/2017 1256 - Progressing by Williams Che, RN

## 2017-07-11 NOTE — Progress Notes (Signed)
Benefit check submitted for [name], will be resulted Monday. Patient provided with 30 day card.

## 2017-07-11 NOTE — Anesthesia Postprocedure Evaluation (Signed)
Anesthesia Post Note  Patient: [name]  Procedure(s) Performed: RIGHT TOTAL HIP REVISION FEMORAL COMPONENT (Right )     Patient location during evaluation: PACU Anesthesia Type: Spinal Level of consciousness: oriented and awake Pain management: pain level controlled Vital Signs Assessment: post-procedure vital signs reviewed and stable Respiratory status: spontaneous breathing, respiratory function stable and patient connected to nasal cannula oxygen Cardiovascular status: blood pressure returned to baseline and stable Postop Assessment: no headache, no backache, no apparent nausea or vomiting and spinal receding Anesthetic complications: no    Last Vitals:  Vitals:   07/10/17 2348 07/11/17 0348  BP:  127/61  Pulse: 99 (!) 107  Resp: 17   Temp:  36.7 C  SpO2:  96%    Last Pain:  Vitals:   07/11/17 0505  TempSrc:   PainSc: 3                  Ryan P Ellender

## 2017-07-11 NOTE — Progress Notes (Signed)
Physical Therapy Treatment Patient Details Name: [name] MRN: [id number] DOB: 21-Jul-1940 Today's Date: 07/11/2017    History of Present Illness 77 y.o. male s/p R THA Revision 07/10/17. Pt with significant PMH of COPD, CAD, Barrett's esophagus, L knee arthroscopy, and coronary angio with stent.    PT Comments    Patient progressing well this afternoon. Session focussed on stair training and increasing activity tolerance. Patient ambulating with supervision, could benefit from 1 more session before d/c to reinforce cary over and cues for slow down and keep proper proximity to RW during transfers.     Follow Up Recommendations  Follow surgeon's recommendation for DC plan and follow-up therapies     Equipment Recommendations  None recommended by PT    Recommendations for Other Services       Precautions / Restrictions Precautions Precautions: Fall;Posterior Hip Precaution Booklet Issued: Yes (comment) Precaution Comments: Pt is a bit fast to move and impulsive. Restrictions Weight Bearing Restrictions: Yes RLE Weight Bearing: Weight bearing as tolerated    Mobility  Bed Mobility Overal bed mobility: Modified Independent                Transfers Overall transfer level: Modified independent Equipment used: Rolling walker (2 wheeled)                Ambulation/Gait Ambulation/Gait assistance: Min guard;Supervision Ambulation Distance (Feet): 50 Feet Assistive device: Rolling walker (2 wheeled) Gait Pattern/deviations: Step-through pattern;Trunk flexed Gait velocity: fast   General Gait Details: Verbal cues for safe RW use and closer proximity to RW.  Needs cues for taller posture as his pre morbid habit is to bend over.    Stairs Stairs: Yes   Stair Management: No rails;Step to pattern;Forwards;With walker Number of Stairs: 4 General stair comments: cues for proper sequencing and safety with RW. patient able to perform with  supervision.  Wheelchair Mobility    Modified Rankin (Stroke Patients Only)       Balance Overall balance assessment: Needs assistance Sitting-balance support: No upper extremity supported Sitting balance-Leahy Scale: Good     Standing balance support: Single extremity supported;Bilateral upper extremity supported Standing balance-Leahy Scale: Fair                              Cognition Arousal/Alertness: Awake/alert Behavior During Therapy: Impulsive Overall Cognitive Status: Within Functional Limits for tasks assessed                                 General Comments: Pt is a bit quick to move, parks his RW and walks around without it and is not safe to do so yet.       Exercises      General Comments        Pertinent Vitals/Pain Pain Assessment: 0-10 Pain Score: 4  Pain Location: right hip Pain Descriptors / Indicators: Aching;Burning Pain Intervention(s): Limited activity within patient's tolerance;Monitored during session;Premedicated before session;Repositioned    Home Living                      Prior Function            PT Goals (current goals can now be found in the care plan section) Acute Rehab PT Goals Patient Stated Goal: go home when ready PT Goal Formulation: With patient Time For Goal Achievement: 07/18/17 Potential to Achieve Goals:  Good Progress towards PT goals: Progressing toward goals    Frequency    7X/week      PT Plan Current plan remains appropriate    Co-evaluation              AM-PAC PT "6 Clicks" Daily Activity  Outcome Measure  Difficulty turning over in bed (including adjusting bedclothes, sheets and blankets)?: A Little Difficulty moving from lying on back to sitting on the side of the bed? : A Little Difficulty sitting down on and standing up from a chair with arms (e.g., wheelchair, bedside commode, etc,.)?: A Little Help needed moving to and from a bed to chair  (including a wheelchair)?: A Little Help needed walking in hospital room?: A Little Help needed climbing 3-5 steps with a railing? : A Little 6 Click Score: 18    End of Session Equipment Utilized During Treatment: Gait belt Activity Tolerance: Patient tolerated treatment well Patient left: in chair;with call bell/phone within reach;with family/visitor present Nurse Communication: Mobility status PT Visit Diagnosis: Unsteadiness on feet (R26.81);Other abnormalities of gait and mobility (R26.89);Pain Pain - Right/Left: Right Pain - part of body: Hip     Time: 0569-7948 PT Time Calculation (min) (ACUTE ONLY): 33 min  Charges:  $Gait Training: 23-37 mins                    G Codes:       Reinaldo Berber, PT, DPT Acute Rehab Services Pager: [phone]    Reinaldo Berber 07/11/2017, 5:28 PM

## 2017-07-11 NOTE — Evaluation (Signed)
Physical Therapy Evaluation Patient Details Name: [name] MRN: [id number] DOB: 1940-05-31 Today's Date: 07/11/2017   History of Present Illness  77 y.o. male s/p R THA Revision 07/10/17. Pt with significant PMH of COPD, CAD, Barrett's esophagus, L knee arthroscopy, and coronary angio with stent.    Clinical Impression  Patient is s/p above surgery resulting in functional limitations due to the deficits listed below (see PT Problem List). PTA pt living at home with wife receiving assistance with ADLs, mod I with community ambulation with RW, living in 1 story home with 1 platform stair to enter. Upon eval patient presents with post op pain. Progressed to supervision for ambulation in hallway with RW, cued to decrease impulsive and quick movements and proper use of RW along with extensive discussion of posterior hip precautions. Next PT visit will focus on stairs anticipate patient will do well.  Patient will benefit from skilled PT to increase their independence and safety with mobility to allow discharge to the venue listed below.       Follow Up Recommendations Follow surgeon's recommendation for DC plan and follow-up therapies    Equipment Recommendations  None recommended by PT    Recommendations for Other Services       Precautions / Restrictions Precautions Precautions: Fall;Posterior Hip Precaution Booklet Issued: Yes (comment) Precaution Comments: Pt is a bit fast to move and impulsive. Restrictions Weight Bearing Restrictions: Yes RLE Weight Bearing: Weight bearing as tolerated      Mobility  Bed Mobility Overal bed mobility: Modified Independent                Transfers Overall transfer level: Modified independent Equipment used: Rolling walker (2 wheeled)                Ambulation/Gait Ambulation/Gait assistance: Min guard;Supervision Ambulation Distance (Feet): 100 Feet Assistive device: Rolling walker (2 wheeled) Gait Pattern/deviations:  Step-through pattern;Trunk flexed Gait velocity: fast   General Gait Details: Verbal cues for safe RW use and closer proximity to RW.  Needs cues for taller posture as his pre morbid habit is to bend over.   Stairs            Wheelchair Mobility    Modified Rankin (Stroke Patients Only)       Balance Overall balance assessment: Needs assistance Sitting-balance support: No upper extremity supported Sitting balance-Leahy Scale: Good     Standing balance support: Single extremity supported;Bilateral upper extremity supported Standing balance-Leahy Scale: Fair                               Pertinent Vitals/Pain Pain Assessment: 0-10 Pain Score: 4  Pain Location: right hip Pain Descriptors / Indicators: Aching;Burning Pain Intervention(s): Limited activity within patient's tolerance;Monitored during session;Premedicated before session;Repositioned    Home Living Family/patient expects to be discharged to:: Private residence Living Arrangements: Spouse/significant other;Children Available Help at Discharge: Family;Available 24 hours/day Type of Home: House Home Access: Stairs to enter   [employer] of Steps: 1 Home Layout: One level Home Equipment: Cane - single point;Walker - 2 wheels      Prior Function Level of Independence: Needs assistance   Gait / Transfers Assistance Needed: walking with AD, in community  ADL's / Homemaking Assistance Needed: wife performing bathing and cooking.        Hand Dominance   Dominant Hand: Right    Extremity/Trunk Assessment   Upper Extremity Assessment Upper Extremity Assessment: Overall  WFL for tasks assessed    Lower Extremity Assessment Lower Extremity Assessment: (LLE WNL) RLE Deficits / Details: right leg with normal post op pain and weakness.  Ankle at least 3/5, knee 3/5, hip 2+/5    Cervical / Trunk Assessment Cervical / Trunk Assessment: Other exceptions Cervical / Trunk  Exceptions: Pt is very flexed at his trunk, needs cues for upright posture.    Communication   Communication: No difficulties  Cognition Arousal/Alertness: Awake/alert Behavior During Therapy: Impulsive Overall Cognitive Status: Within Functional Limits for tasks assessed                                 General Comments: Pt is a bit quick to move, parks his RW and walks around without it and is not safe to do so yet.       General Comments General comments (skin integrity, edema, etc.): Extensive discussion over hip precautions and safety for home     Exercises Total Joint Exercises Ankle Circles/Pumps: AROM;Both;20 reps Quad Sets: AROM;Both;10 reps Hip ABduction/ADduction: AROM;Right;10 reps;Supine;Standing   Assessment/Plan    PT Assessment Patient needs continued PT services  PT Problem List Decreased strength;Decreased range of motion;Decreased activity tolerance;Decreased balance;Decreased mobility;Decreased knowledge of use of DME;Pain       PT Treatment Interventions DME instruction;Gait training    PT Goals (Current goals can be found in the Care Plan section)  Acute Rehab PT Goals Patient Stated Goal: go home when ready PT Goal Formulation: With patient Time For Goal Achievement: 07/18/17 Potential to Achieve Goals: Good    Frequency 7X/week   Barriers to discharge        Co-evaluation               AM-PAC PT "6 Clicks" Daily Activity  Outcome Measure Difficulty turning over in bed (including adjusting bedclothes, sheets and blankets)?: A Little Difficulty moving from lying on back to sitting on the side of the bed? : A Little Difficulty sitting down on and standing up from a chair with arms (e.g., wheelchair, bedside commode, etc,.)?: A Little Help needed moving to and from a bed to chair (including a wheelchair)?: None Help needed walking in hospital room?: A Little Help needed climbing 3-5 steps with a railing? : A Little 6 Click  Score: 19    End of Session Equipment Utilized During Treatment: Gait belt Activity Tolerance: Patient tolerated treatment well Patient left: in chair;with call bell/phone within reach;with family/visitor present Nurse Communication: Mobility status PT Visit Diagnosis: Unsteadiness on feet (R26.81);Other abnormalities of gait and mobility (R26.89);Pain Pain - Right/Left: Right Pain - part of body: Hip    Time: 1884-1660 PT Time Calculation (min) (ACUTE ONLY): 43 min   Charges:   PT Evaluation $PT Eval Low Complexity: 1 Low PT Treatments $Gait Training: 8-22 mins $Self Care/Home Management: 8-22   PT G Codes:       Reinaldo Berber, PT, DPT Acute Rehab Services Pager: [phone]   Reinaldo Berber 07/11/2017, 9:54 AM

## 2017-07-12 LAB — CBC
HCT: 31.2 % — ABNORMAL LOW (ref 39.0–52.0)
HEMOGLOBIN: 10 g/dL — AB (ref 13.0–17.0)
MCH: 32.2 pg (ref 26.0–34.0)
MCHC: 32.1 g/dL (ref 30.0–36.0)
MCV: 100.3 fL — ABNORMAL HIGH (ref 78.0–100.0)
Platelets: 212 10*3/uL (ref 150–400)
RBC: 3.11 MIL/uL — ABNORMAL LOW (ref 4.22–5.81)
RDW: 15 % (ref 11.5–15.5)
WBC: 9.8 10*3/uL (ref 4.0–10.5)

## 2017-07-12 LAB — AEROBIC CULTURE  (SUPERFICIAL SPECIMEN)

## 2017-07-12 LAB — AEROBIC CULTURE W GRAM STAIN (SUPERFICIAL SPECIMEN)

## 2017-07-12 NOTE — Progress Notes (Signed)
Subjective: 2 Days Post-Op Procedure(s) (LRB): RIGHT TOTAL HIP REVISION FEMORAL COMPONENT (Right) Patient reports pain as 2 on 0-10 scale.  No new  problems today.   Objective: Vital signs in last 24 hours: Temp:  [97.9 F (36.6 C)-98.2 F (36.8 C)] 97.9 F (36.6 C) (03/17 0353) Pulse Rate:  [97-107] 97 (03/17 0353) Resp:  [18] 18 (03/17 0353) BP: (123-137)/(72-75) 137/75 (03/17 0353) SpO2:  [97 %-99 %] 99 % (03/17 0353)  Intake/Output from previous day: 03/16 0701 - 03/17 0700 In: 5040 [I.V.:5040] Out: 2680 [Urine:2680] Intake/Output this shift: No intake/output data recorded.  Recent Labs    07/11/17 0557 07/12/17 0448  HGB 9.4* 10.0*   Recent Labs    07/11/17 0557 07/12/17 0448  WBC 7.6 9.8  RBC 2.85* 3.11*  HCT 28.3* 31.2*  PLT 185 212   Recent Labs    07/11/17 0557  NA 136  K 4.3  CL 105  CO2 23  BUN 10  CREATININE 0.93  GLUCOSE 196*  CALCIUM 8.4*   No results for input(s): LABPT, INR in the last 72 hours.  Neurovascular intact  Assessment/Plan: 2 Days Post-Op Procedure(s) (LRB): RIGHT TOTAL HIP REVISION FEMORAL COMPONENT (Right) Up with therapy  Latanya Maudlin 07/12/2017, 8:07 AM

## 2017-07-12 NOTE — Progress Notes (Signed)
Physical Therapy Treatment Patient Details Name: [name] MRN: [id number] DOB: 1941/02/02 Today's Date: 07/12/2017    History of Present Illness 77 y.o. male s/p R THA Revision 07/10/17. Pt with significant PMH of COPD, CAD, Barrett's esophagus, L knee arthroscopy, and coronary angio with stent.    PT Comments    Pt with improved walker management and safety this session. Pt with improved gait pattern as well. Acute PT to con't to follow.   Follow Up Recommendations  Follow surgeon's recommendation for DC plan and follow-up therapies     Equipment Recommendations  None recommended by PT    Recommendations for Other Services       Precautions / Restrictions Precautions Precautions: Fall;Posterior Hip Precaution Booklet Issued: Yes (comment) Precaution Comments: pt only able to recall 1/3 precautions Restrictions Weight Bearing Restrictions: Yes RLE Weight Bearing: Weight bearing as tolerated    Mobility  Bed Mobility Overal bed mobility: Modified Independent             General bed mobility comments: HOB elevated all the way up  Transfers Overall transfer level: Needs assistance Equipment used: Rolling walker (2 wheeled) Transfers: Sit to/from Stand Sit to Stand: Supervision         General transfer comment: bed raised to adhere to precautions, v/c's to push up from bed and minimize hip flexion  Ambulation/Gait Ambulation/Gait assistance: Min guard;Supervision Ambulation Distance (Feet): 120 Feet Assistive device: Rolling walker (2 wheeled) Gait Pattern/deviations: Step-through pattern;Trunk flexed     General Gait Details: focused on heel/toe gait pattern and smooth reciprocal gait pattern. pt with improved pace and more focuse on technique and less impulsive   Stairs            Wheelchair Mobility    Modified Rankin (Stroke Patients Only)       Balance Overall balance assessment: Needs assistance Sitting-balance support: No upper  extremity supported Sitting balance-Leahy Scale: Good     Standing balance support: No upper extremity supported Standing balance-Leahy Scale: Fair Standing balance comment: able to stand and urinate and stand at sink and wash hands without difficulty                            Cognition Arousal/Alertness: Awake/alert Behavior During Therapy: Impulsive Overall Cognitive Status: Within Functional Limits for tasks assessed                                 General Comments: with v/c's pt slows down, pt habitually fast      Exercises Total Joint Exercises Ankle Circles/Pumps: AROM;Both;20 reps Quad Sets: AROM;Both;10 reps Gluteal Sets: AROM;Both;10 reps;Seated    General Comments        Pertinent Vitals/Pain Pain Assessment: 0-10 Pain Score: 4  Pain Location: R hip Pain Descriptors / Indicators: Aching Pain Intervention(s): Monitored during session    Home Living                      Prior Function            PT Goals (current goals can now be found in the care plan section) Progress towards PT goals: Progressing toward goals    Frequency    7X/week      PT Plan Current plan remains appropriate    Co-evaluation              AM-PAC PT "  6 Clicks" Daily Activity  Outcome Measure  Difficulty turning over in bed (including adjusting bedclothes, sheets and blankets)?: A Little Difficulty moving from lying on back to sitting on the side of the bed? : A Little Difficulty sitting down on and standing up from a chair with arms (e.g., wheelchair, bedside commode, etc,.)?: A Little Help needed moving to and from a bed to chair (including a wheelchair)?: A Little Help needed walking in hospital room?: A Little Help needed climbing 3-5 steps with a railing? : A Little 6 Click Score: 18    End of Session Equipment Utilized During Treatment: Gait belt Activity Tolerance: Patient tolerated treatment well Patient left: in  chair;with call bell/phone within reach;with family/visitor present Nurse Communication: Mobility status PT Visit Diagnosis: Unsteadiness on feet (R26.81);Other abnormalities of gait and mobility (R26.89);Pain Pain - Right/Left: Right Pain - part of body: Hip     Time: 1203-1230 PT Time Calculation (min) (ACUTE ONLY): 27 min  Charges:  $Gait Training: 8-22 mins $Therapeutic Exercise: 8-22 mins                    G Codes:       Kittie Plater, PT, DPT Pager #: [phone] Office #: [phone]    [place] 07/12/2017, 1:57 PM

## 2017-07-12 NOTE — Progress Notes (Signed)
James, [name] ([id number]) Visit Report for 07/09/2017 Chief Complaint Document Details Patient Name: [name], [name]. Date of Service: 07/09/2017 10:30 AM Medical Record Number: [id number] Patient Account Number: [id number] Date of Birth/Sex: 1940-11-29 (77 y.o. Male) Treating RN: Ahmed Prima Primary Care Provider: Deland Pretty Other Clinician: Referring Provider: Deland Pretty Treating Provider/Extender: Melburn Hake, Masai Kidd Weeks in Treatment: 1 Information Obtained from: Patient Chief Complaint He is here for evaluation of RLE blisters Electronic Signature(s) Signed: 07/10/2017 6:13:52 PM By: James Keeler PA-C Entered By: [name] on 07/09/2017 10:35:35 Nedeau, James Bales ([id number]) -------------------------------------------------------------------------------- HPI Details Patient Name: [name] Date of Service: 07/09/2017 10:30 AM Medical Record Number: [id number] Patient Account Number: [id number] Date of Birth/Sex: 02/20/1941 (77 y.o. Male) Treating RN: Ahmed Prima Primary Care Provider: Deland Pretty Other Clinician: Referring Provider: Deland Pretty Treating Provider/Extender: Melburn Hake, Arelis Neumeier Weeks in Treatment: 1 History of Present Illness Location: RLE Quality: no pain Duration: +/-1week Context: secondary to edema HPI Description: 07/02/17-he is here for initial dilation of multiple blisters of the right lower extremity. He is status post right [facility] 2/25 for arthritis. He stated postoperatively he did have thigh-high TED hose on bilaterally until discharged. He has had progressive swelling to his bilateral lower extremities, right more so than left. He has developed multiple blisters to the anterior aspect of his right lower extremity. He was evaluated on Monday at the emergency department in [facility]. DVT study was negative, he was discharged on Ace wrap compression and a three-day increase in his Lasix. He has been applying daily compression  therapy since. His echo in 09/2016 with EF 50-55%. Large anterior bullae was drained of serous fluid, no concern for infection. Will treat with bilateral lower extremity compression wrap; anticipate this to be significantly improved at next week's appointment and can then be measured for long-term compression therapy. 07/09/17 on evaluation today patient's right lower extremity ulcer appears to show evidence of doing a little bit worse at this point. Unfortunately he seems to have some cellulitis surrounding the area of blistering on the anterior surface of the right lower extremity. Obviously this is not good especially considering that he is actually set to have his hip replacement surgery tomorrow morning. I do think that we need to get in touch with his physician. His orthopedic surgeon is Dr. Rex Kras at [place]. Electronic Signature(s) Signed: 07/10/2017 6:13:52 PM By: James Keeler PA-C Entered By: [name] on 07/09/2017 16:01:01 [name], James Bales ([id number]) -------------------------------------------------------------------------------- Physical Exam Details Patient Name: [name] Date of Service: 07/09/2017 10:30 AM Medical Record Number: [id number] Patient Account Number: [id number] Date of Birth/Sex: 1940-09-21 (77 y.o. Male) Treating RN: Ahmed Prima Primary Care Provider: Deland Pretty Other Clinician: Referring Provider: Deland Pretty Treating Provider/Extender: Melburn Hake, Mylinda Brook Weeks in Treatment: 1 Constitutional Obese and well-hydrated in no acute distress. Respiratory normal breathing without difficulty. clear to auscultation bilaterally. Cardiovascular regular rate and rhythm with normal S1, S2. 2+ pitting edema of the right lower extremity. Psychiatric this patient is able to make decisions and demonstrates good insight into disease process. Alert and Oriented x 3. pleasant and cooperative. Notes Patient's wound did not require any  sharp debridement today which was good news. With that being said he did seem to have evidence of infection, cellulitis. This was discussed with him during the evaluation he also continues to have significant swelling of the right lower extremity compared to the left. For that reason I am inclined to go ahead and  initiate treatment utilizing antibiotic therapy patient is in agreement with the plan. Electronic Signature(s) Signed: 07/10/2017 6:13:52 PM By: James Keeler PA-C Entered By: [name] on 07/09/2017 16:03:09 [name], James Bales ([id number]) -------------------------------------------------------------------------------- Physician Orders Details Patient Name: [name] Date of Service: 07/09/2017 10:30 AM Medical Record Number: [id number] Patient Account Number: [id number] Date of Birth/Sex: 1941/01/18 (77 y.o. Male) Treating RN: Ahmed Prima Primary Care Provider: Deland Pretty Other Clinician: Referring Provider: Deland Pretty Treating Provider/Extender: Melburn Hake, Alane Hanssen Weeks in Treatment: 1 Verbal / Phone Orders: Yes ClinicianCarolyne Fiscal, Debi Read Back and Verified: Yes Diagnosis Coding ICD-10 Coding Code Description R60.9 Edema, unspecified S80.821S Blister (nonthermal), right lower leg, sequela Wound Cleansing Wound #1 Right,Anterior Lower Leg o Clean wound with Normal Saline. o Cleanse wound with mild soap and water o May shower with protection. Anesthetic (add to Medication List) Wound #1 Right,Anterior Lower Leg o Topical Lidocaine 4% cream applied to wound bed prior to debridement (In Clinic Only). Primary Wound Dressing Wound #1 Right,Anterior Lower Leg o Silvercel Non-Adherent Secondary Dressing Wound #1 Right,Anterior Lower Leg o ABD pad o Conform/Kerlix o Other - ace wrap Dressing Change Frequency Wound #1 Right,Anterior Lower Leg o Change dressing every other day. Follow-up Appointments Wound #1 Right,Anterior Lower  Leg o Return Appointment in 1 week. Edema Control Wound #1 Right,Anterior Lower Leg o Elevate legs to the level of the heart and pump ankles as often as possible Additional Orders / Instructions Wound #1 Right,Anterior Lower Leg o Increase protein intake. HUY, MAJID ([id number]) Home Health Wound #1 [place] Visits o Home Health Nurse may visit PRN to address patientos wound care needs. o FACE TO FACE ENCOUNTER: MEDICARE and MEDICAID PATIENTS: I certify that this patient is under my care and that I had a face-to-face encounter that meets the physician face-to-face encounter requirements with this patient on this date. The encounter with the patient was in whole or in part for the following MEDICAL CONDITION: (primary reason for [place]) MEDICAL NECESSITY: I certify, that based on my findings, NURSING services are a medically necessary home health service. HOME BOUND STATUS: I certify that my clinical findings support that this patient is homebound (i.e., Due to illness or injury, pt requires aid of supportive devices such as crutches, cane, wheelchairs, walkers, the use of special transportation or the assistance of another person to leave their place of residence. There is a normal inability to leave the home and doing so requires considerable and taxing effort. Other absences are for medical reasons / religious services and are infrequent or of short duration when for other reasons). o If current dressing causes regression in wound condition, may D/C ordered dressing product/s and apply Normal Saline Moist Dressing daily until next [place] / Other MD appointment. [place] of regression in wound condition at [id number]. o Please direct any NON-WOUND related issues/requests for orders to patient's Primary Care Physician Laboratory o Bacteria identified in Wound by Culture (MICRO) oooo  LOINC Code: [phone] oooo Convenience Name: Wound culture routine Patient Medications Allergies: No Known Allergies Notifications Medication Indication Start End lidocaine DOSE 1 - topical 4 % cream - 1 cream topical Bactrim DS 07/09/2017 DOSE 1 - oral 800 mg-160 mg tablet - 1 tablet oral taken 2 times a day for 10 days. Do not take potassium with this antibiotic doxycycline hyclate 07/09/2017 DOSE 1 - oral 100 mg capsule - 1 capsule oral taken 2 times a  initiate treatment utilizing antibiotic therapy patient is in agreement with the plan. Electronic Signature(s) Signed: 07/10/2017 6:13:52 PM By: James Keeler PA-C Entered By: [name] on 07/09/2017 16:03:09 [name], James Bales ([id number]) -------------------------------------------------------------------------------- Physician Orders Details Patient Name: [name] Date of Service: 07/09/2017 10:30 AM Medical Record Number: [id number] Patient Account Number: [id number] Date of Birth/Sex: 1941/01/18 (77 y.o. Male) Treating RN: Ahmed Prima Primary Care Provider: Deland Pretty Other Clinician: Referring Provider: Deland Pretty Treating Provider/Extender: Melburn Hake, Alane Hanssen Weeks in Treatment: 1 Verbal / Phone Orders: Yes ClinicianCarolyne Fiscal, Debi Read Back and Verified: Yes Diagnosis Coding ICD-10 Coding Code Description R60.9 Edema, unspecified S80.821S Blister (nonthermal), right lower leg, sequela Wound Cleansing Wound #1 Right,Anterior Lower Leg o Clean wound with Normal Saline. o Cleanse wound with mild soap and water o May shower with protection. Anesthetic (add to Medication List) Wound #1 Right,Anterior Lower Leg o Topical Lidocaine 4% cream applied to wound bed prior to debridement (In Clinic Only). Primary Wound Dressing Wound #1 Right,Anterior Lower Leg o Silvercel Non-Adherent Secondary Dressing Wound #1 Right,Anterior Lower Leg o ABD pad o Conform/Kerlix o Other - ace wrap Dressing Change Frequency Wound #1 Right,Anterior Lower Leg o Change dressing every other day. Follow-up Appointments Wound #1 Right,Anterior Lower  Leg o Return Appointment in 1 week. Edema Control Wound #1 Right,Anterior Lower Leg o Elevate legs to the level of the heart and pump ankles as often as possible Additional Orders / Instructions Wound #1 Right,Anterior Lower Leg o Increase protein intake. HUY, MAJID ([id number]) Home Health Wound #1 [place] Visits o Home Health Nurse may visit PRN to address patientos wound care needs. o FACE TO FACE ENCOUNTER: MEDICARE and MEDICAID PATIENTS: I certify that this patient is under my care and that I had a face-to-face encounter that meets the physician face-to-face encounter requirements with this patient on this date. The encounter with the patient was in whole or in part for the following MEDICAL CONDITION: (primary reason for [place]) MEDICAL NECESSITY: I certify, that based on my findings, NURSING services are a medically necessary home health service. HOME BOUND STATUS: I certify that my clinical findings support that this patient is homebound (i.e., Due to illness or injury, pt requires aid of supportive devices such as crutches, cane, wheelchairs, walkers, the use of special transportation or the assistance of another person to leave their place of residence. There is a normal inability to leave the home and doing so requires considerable and taxing effort. Other absences are for medical reasons / religious services and are infrequent or of short duration when for other reasons). o If current dressing causes regression in wound condition, may D/C ordered dressing product/s and apply Normal Saline Moist Dressing daily until next [place] / Other MD appointment. [place] of regression in wound condition at [id number]. o Please direct any NON-WOUND related issues/requests for orders to patient's Primary Care Physician Laboratory o Bacteria identified in Wound by Culture (MICRO) oooo  LOINC Code: [phone] oooo Convenience Name: Wound culture routine Patient Medications Allergies: No Known Allergies Notifications Medication Indication Start End lidocaine DOSE 1 - topical 4 % cream - 1 cream topical Bactrim DS 07/09/2017 DOSE 1 - oral 800 mg-160 mg tablet - 1 tablet oral taken 2 times a day for 10 days. Do not take potassium with this antibiotic doxycycline hyclate 07/09/2017 DOSE 1 - oral 100 mg capsule - 1 capsule oral taken 2 times a  James, [name] ([id number]) Visit Report for 07/09/2017 Chief Complaint Document Details Patient Name: [name], [name]. Date of Service: 07/09/2017 10:30 AM Medical Record Number: [id number] Patient Account Number: [id number] Date of Birth/Sex: 1940-11-29 (77 y.o. Male) Treating RN: Ahmed Prima Primary Care Provider: Deland Pretty Other Clinician: Referring Provider: Deland Pretty Treating Provider/Extender: Melburn Hake, Masai Kidd Weeks in Treatment: 1 Information Obtained from: Patient Chief Complaint He is here for evaluation of RLE blisters Electronic Signature(s) Signed: 07/10/2017 6:13:52 PM By: James Keeler PA-C Entered By: [name] on 07/09/2017 10:35:35 Nedeau, James Bales ([id number]) -------------------------------------------------------------------------------- HPI Details Patient Name: [name] Date of Service: 07/09/2017 10:30 AM Medical Record Number: [id number] Patient Account Number: [id number] Date of Birth/Sex: 02/20/1941 (77 y.o. Male) Treating RN: Ahmed Prima Primary Care Provider: Deland Pretty Other Clinician: Referring Provider: Deland Pretty Treating Provider/Extender: Melburn Hake, Arelis Neumeier Weeks in Treatment: 1 History of Present Illness Location: RLE Quality: no pain Duration: +/-1week Context: secondary to edema HPI Description: 07/02/17-he is here for initial dilation of multiple blisters of the right lower extremity. He is status post right [facility] 2/25 for arthritis. He stated postoperatively he did have thigh-high TED hose on bilaterally until discharged. He has had progressive swelling to his bilateral lower extremities, right more so than left. He has developed multiple blisters to the anterior aspect of his right lower extremity. He was evaluated on Monday at the emergency department in [facility]. DVT study was negative, he was discharged on Ace wrap compression and a three-day increase in his Lasix. He has been applying daily compression  therapy since. His echo in 09/2016 with EF 50-55%. Large anterior bullae was drained of serous fluid, no concern for infection. Will treat with bilateral lower extremity compression wrap; anticipate this to be significantly improved at next week's appointment and can then be measured for long-term compression therapy. 07/09/17 on evaluation today patient's right lower extremity ulcer appears to show evidence of doing a little bit worse at this point. Unfortunately he seems to have some cellulitis surrounding the area of blistering on the anterior surface of the right lower extremity. Obviously this is not good especially considering that he is actually set to have his hip replacement surgery tomorrow morning. I do think that we need to get in touch with his physician. His orthopedic surgeon is Dr. Rex Kras at [place]. Electronic Signature(s) Signed: 07/10/2017 6:13:52 PM By: James Keeler PA-C Entered By: [name] on 07/09/2017 16:01:01 [name], James Bales ([id number]) -------------------------------------------------------------------------------- Physical Exam Details Patient Name: [name] Date of Service: 07/09/2017 10:30 AM Medical Record Number: [id number] Patient Account Number: [id number] Date of Birth/Sex: 1940-09-21 (77 y.o. Male) Treating RN: Ahmed Prima Primary Care Provider: Deland Pretty Other Clinician: Referring Provider: Deland Pretty Treating Provider/Extender: Melburn Hake, Mylinda Brook Weeks in Treatment: 1 Constitutional Obese and well-hydrated in no acute distress. Respiratory normal breathing without difficulty. clear to auscultation bilaterally. Cardiovascular regular rate and rhythm with normal S1, S2. 2+ pitting edema of the right lower extremity. Psychiatric this patient is able to make decisions and demonstrates good insight into disease process. Alert and Oriented x 3. pleasant and cooperative. Notes Patient's wound did not require any  sharp debridement today which was good news. With that being said he did seem to have evidence of infection, cellulitis. This was discussed with him during the evaluation he also continues to have significant swelling of the right lower extremity compared to the left. For that reason I am inclined to go ahead and  III, Annelies Coyt PA-C Entered By: [name] on 07/09/2017 16:04:25 Cajuste, James Bales ([id number]) -------------------------------------------------------------------------------- ROS/PFSH Details Patient Name: [name] Date of Service: 07/09/2017 10:30 AM Medical Record Number: [id number] Patient Account Number: [id number] Date of Birth/Sex: 06-05-1940 (77 y.o. Male) Treating RN: Ahmed Prima Primary Care Provider: Deland Pretty Other Clinician: Referring Provider: Deland Pretty Treating Provider/Extender: Melburn Hake, Alanee Ting Weeks in Treatment: 1 Information Obtained From Patient Wound History Do you currently have one or more open woundso No Have you tested positive for osteomyelitis (bone infection)o No Have you had any tests for circulation on your legso No Have you had other problems associated with your woundso Swelling Constitutional Symptoms (General Health) Complaints and Symptoms: Negative for: Fever; Chills Cardiovascular Complaints and Symptoms: Positive for: LE  edema Medical History: Positive for: Coronary Artery Disease; Hypertension Past Medical History Notes: hyperlipidemia Eyes Medical History: Negative for: Cataracts; Glaucoma; Optic Neuritis Ear/Nose/Mouth/Throat Medical History: Negative for: Chronic sinus problems/congestion; Middle ear problems Respiratory Complaints and Symptoms: No Complaints or Symptoms Medical History: Positive for: Chronic Obstructive Pulmonary Disease (COPD); Sleep Apnea - CPAP Gastrointestinal Medical History: Negative for: Cirrhosis ; Colitis; Crohnos; Hepatitis A; Hepatitis B; Hepatitis C Past Medical History Notes: barrett's esophagus Endocrine JIHAN, RUDY. ([id number]) Medical History: Negative for: Type I Diabetes; Type II Diabetes Musculoskeletal Medical History: Positive for: Osteoarthritis Neurologic Medical History: Negative for: Dementia; Neuropathy Oncologic Medical History: Positive for: Received Radiation Negative for: Received Chemotherapy Past Medical History Notes: radiation as a child on forehead Psychiatric Complaints and Symptoms: No Complaints or Symptoms Immunizations Pneumococcal Vaccine: Received Pneumococcal Vaccination: Yes Immunization Notes: up to date Implantable Devices Family and Social History Cancer: Yes - Father,Maternal Grandparents; Diabetes: No; Heart Disease: No; Hereditary Spherocytosis: No; Hypertension: No; Kidney Disease: No; Lung Disease: Yes - Mother; Seizures: No; Stroke: Yes - Paternal Grandparents; Thyroid Problems: No; Tuberculosis: No; Former smoker - quit 33 years ago; Marital Status - Married; Alcohol Use: Moderate; Drug Use: No History; Caffeine Use: Daily; Financial Concerns: No; Food, Clothing or Shelter Needs: No; Support System Lacking: No; Transportation Concerns: No; Advanced Directives: No; Patient does not want information on Advanced Directives Physician Affirmation I have reviewed and agree with the above  information. Electronic Signature(s) Signed: 07/10/2017 3:16:46 PM By: Alric Quan Signed: 07/10/2017 6:13:52 PM By: James Keeler PA-C Entered By: [name] on 07/09/2017 16:01:28 [place], James Bales ([id number]) -------------------------------------------------------------------------------- SuperBill Details Patient Name: [name] Date of Service: 07/09/2017 Medical Record Number: [id number] Patient Account Number: [id number] Date of Birth/Sex: Feb 26, 1941 (77 y.o. Male) Treating RN: Ahmed Prima Primary Care Provider: Deland Pretty Other Clinician: Referring Provider: Deland Pretty Treating Provider/Extender: Melburn Hake, Selyna Klahn Weeks in Treatment: 1 Diagnosis Coding ICD-10 Codes Code Description R60.9 Edema, unspecified S80.821S Blister (nonthermal), right lower leg, sequela L03.115 Cellulitis of right lower limb Facility Procedures CPT4 Code: [id number] Description: 11572 - WOUND CARE VISIT-LEV 3 EST PT Modifier: Quantity: 1 Physician Procedures CPT4 Code: [id number] Description: 74163 - WC PHYS LEVEL 4 - EST PT ICD-10 Diagnosis Description R60.9 Edema, unspecified S80.821S Blister (nonthermal), right lower leg, sequela L03.115 Cellulitis of right lower limb Modifier: Quantity: 1 Electronic Signature(s) Signed: 07/10/2017 6:13:52 PM By: James Keeler PA-C Entered By: [name] on 07/09/2017 16:04:41  III, Annelies Coyt PA-C Entered By: [name] on 07/09/2017 16:04:25 Cajuste, James Bales ([id number]) -------------------------------------------------------------------------------- ROS/PFSH Details Patient Name: [name] Date of Service: 07/09/2017 10:30 AM Medical Record Number: [id number] Patient Account Number: [id number] Date of Birth/Sex: 06-05-1940 (77 y.o. Male) Treating RN: Ahmed Prima Primary Care Provider: Deland Pretty Other Clinician: Referring Provider: Deland Pretty Treating Provider/Extender: Melburn Hake, Alanee Ting Weeks in Treatment: 1 Information Obtained From Patient Wound History Do you currently have one or more open woundso No Have you tested positive for osteomyelitis (bone infection)o No Have you had any tests for circulation on your legso No Have you had other problems associated with your woundso Swelling Constitutional Symptoms (General Health) Complaints and Symptoms: Negative for: Fever; Chills Cardiovascular Complaints and Symptoms: Positive for: LE  edema Medical History: Positive for: Coronary Artery Disease; Hypertension Past Medical History Notes: hyperlipidemia Eyes Medical History: Negative for: Cataracts; Glaucoma; Optic Neuritis Ear/Nose/Mouth/Throat Medical History: Negative for: Chronic sinus problems/congestion; Middle ear problems Respiratory Complaints and Symptoms: No Complaints or Symptoms Medical History: Positive for: Chronic Obstructive Pulmonary Disease (COPD); Sleep Apnea - CPAP Gastrointestinal Medical History: Negative for: Cirrhosis ; Colitis; Crohnos; Hepatitis A; Hepatitis B; Hepatitis C Past Medical History Notes: barrett's esophagus Endocrine JIHAN, RUDY. ([id number]) Medical History: Negative for: Type I Diabetes; Type II Diabetes Musculoskeletal Medical History: Positive for: Osteoarthritis Neurologic Medical History: Negative for: Dementia; Neuropathy Oncologic Medical History: Positive for: Received Radiation Negative for: Received Chemotherapy Past Medical History Notes: radiation as a child on forehead Psychiatric Complaints and Symptoms: No Complaints or Symptoms Immunizations Pneumococcal Vaccine: Received Pneumococcal Vaccination: Yes Immunization Notes: up to date Implantable Devices Family and Social History Cancer: Yes - Father,Maternal Grandparents; Diabetes: No; Heart Disease: No; Hereditary Spherocytosis: No; Hypertension: No; Kidney Disease: No; Lung Disease: Yes - Mother; Seizures: No; Stroke: Yes - Paternal Grandparents; Thyroid Problems: No; Tuberculosis: No; Former smoker - quit 33 years ago; Marital Status - Married; Alcohol Use: Moderate; Drug Use: No History; Caffeine Use: Daily; Financial Concerns: No; Food, Clothing or Shelter Needs: No; Support System Lacking: No; Transportation Concerns: No; Advanced Directives: No; Patient does not want information on Advanced Directives Physician Affirmation I have reviewed and agree with the above  information. Electronic Signature(s) Signed: 07/10/2017 3:16:46 PM By: Alric Quan Signed: 07/10/2017 6:13:52 PM By: James Keeler PA-C Entered By: [name] on 07/09/2017 16:01:28 [place], James Bales ([id number]) -------------------------------------------------------------------------------- SuperBill Details Patient Name: [name] Date of Service: 07/09/2017 Medical Record Number: [id number] Patient Account Number: [id number] Date of Birth/Sex: Feb 26, 1941 (77 y.o. Male) Treating RN: Ahmed Prima Primary Care Provider: Deland Pretty Other Clinician: Referring Provider: Deland Pretty Treating Provider/Extender: Melburn Hake, Selyna Klahn Weeks in Treatment: 1 Diagnosis Coding ICD-10 Codes Code Description R60.9 Edema, unspecified S80.821S Blister (nonthermal), right lower leg, sequela L03.115 Cellulitis of right lower limb Facility Procedures CPT4 Code: 73428768 Description: 11572 - WOUND CARE VISIT-LEV 3 EST PT Modifier: Quantity: 1 Physician Procedures CPT4 Code: 6203559 Description: 74163 - WC PHYS LEVEL 4 - EST PT ICD-10 Diagnosis Description R60.9 Edema, unspecified S80.821S Blister (nonthermal), right lower leg, sequela L03.115 Cellulitis of right lower limb Modifier: Quantity: 1 Electronic Signature(s) Signed: 07/10/2017 6:13:52 PM By: James Keeler PA-C Entered By: [name] on 07/09/2017 16:04:41

## 2017-07-13 LAB — CBC
HCT: 34.4 % — ABNORMAL LOW (ref 39.0–52.0)
Hemoglobin: 10.8 g/dL — ABNORMAL LOW (ref 13.0–17.0)
MCH: 32.1 pg (ref 26.0–34.0)
MCHC: 31.4 g/dL (ref 30.0–36.0)
MCV: 102.4 fL — ABNORMAL HIGH (ref 78.0–100.0)
Platelets: 207 10*3/uL (ref 150–400)
RBC: 3.36 MIL/uL — ABNORMAL LOW (ref 4.22–5.81)
RDW: 15.3 % (ref 11.5–15.5)
WBC: 7.1 10*3/uL (ref 4.0–10.5)

## 2017-07-13 MED ORDER — APIXABAN 2.5 MG PO TABS: 2.5000 mg | ORAL_TABLET | Freq: Two times a day (BID) | ORAL | 0 refills | Status: DC

## 2017-07-13 MED ORDER — HYDROCODONE-ACETAMINOPHEN 5-325 MG PO TABS: 1.0000 | ORAL_TABLET | Freq: Four times a day (QID) | ORAL | 0 refills | Status: DC | PRN

## 2017-07-13 NOTE — Progress Notes (Signed)
[name] to be D/C'd Home per MD order.  Discussed prescriptions and follow up appointments with the patient. Prescriptions given to patient, medication list explained in detail. Pt verbalized understanding.  Allergies as of 07/13/2017   No Known Allergies     Medication List    STOP taking these medications   acetaminophen 500 MG tablet Commonly known as:  TYLENOL   aspirin 81 MG chewable tablet   betamethasone dipropionate 0.05 % cream Commonly known as:  DIPROLENE     TAKE these medications   albuterol 108 (90 Base) MCG/ACT inhaler Commonly known as:  PROVENTIL HFA;VENTOLIN HFA Inhale 1-2 puffs into the lungs every 6 (six) hours as needed for wheezing or shortness of breath.   amLODipine 5 MG tablet Commonly known as:  NORVASC Take 1 tablet (5 mg total) by mouth daily.   apixaban 2.5 MG Tabs tablet Commonly known as:  ELIQUIS Take 1 tablet (2.5 mg total) by mouth every 12 (twelve) hours.   atorvastatin 40 MG tablet Commonly known as:  LIPITOR Take 40 mg by mouth every evening.   cycloSPORINE 0.05 % ophthalmic emulsion Commonly known as:  RESTASIS Place 1 drop into both eyes 2 (two) times daily.   docusate sodium 100 MG capsule Commonly known as:  COLACE Take 1 capsule (100 mg total) by mouth 2 (two) times daily.   doxycycline 100 MG capsule Commonly known as:  VIBRAMYCIN Take 100 mg by mouth 2 (two) times daily. Has 17 doses left   fexofenadine 180 MG tablet Commonly known as:  ALLEGRA Take 180 mg by mouth daily.   fluticasone 0.05 % cream Commonly known as:  CUTIVATE Apply 1 application topically 2 (two) times daily.   furosemide 20 MG tablet Commonly known as:  LASIX Take 2 tablets (40 mg total) by mouth daily. What changed:  when to take this   GLUCOSAMINE 1500 COMPLEX PO Take 2 capsules by mouth daily.   HYDROcodone-acetaminophen 5-325 MG tablet Commonly known as:  NORCO/VICODIN Take 1-2 tablets by mouth every 6 (six) hours as needed for  moderate pain. What changed:    when to take this  reasons to take this   KLOR-CON M10 10 MEQ tablet Generic drug:  potassium chloride TAKE 1 TABLET (10 MEQ TOTAL) BY MOUTH DAILY.   levothyroxine 137 MCG tablet Commonly known as:  SYNTHROID, LEVOTHROID Take 137 mcg by mouth daily before breakfast.   Lutein-Zeaxanthin 25-5 MG Caps Take 1 capsule by mouth daily.   nitroGLYCERIN 0.4 MG/SPRAY spray Commonly known as:  NITROLINGUAL Place 1 spray under the tongue every 5 (five) minutes x 3 doses as needed for chest pain. Use as directed as needed   Omega-3 1000 MG Caps Take 1000 mg by mouth once daily   ondansetron 4 MG tablet Commonly known as:  ZOFRAN Take 1 tablet (4 mg total) by mouth every 6 (six) hours as needed for nausea.   OVER THE COUNTER MEDICATION Take 1 tablet by mouth daily. Prostate Plus Supplement - "Cranberry on Steroids"   pantoprazole 40 MG tablet Commonly known as:  PROTONIX Take 30-60 min before first meal of the day What changed:    how much to take  how to take this  when to take this  additional instructions   SENIOR MULTIVITAMIN PLUS PO Take 1 tablet by mouth daily.   senna 8.6 MG Tabs tablet Commonly known as:  SENOKOT Take 2 tablets (17.2 mg total) by mouth at bedtime.   sildenafil 100 MG tablet  Commonly known as:  VIAGRA Take 100 mg by mouth daily as needed for erectile dysfunction.   sulfamethoxazole-trimethoprim 800-160 MG tablet Commonly known as:  BACTRIM DS,SEPTRA DS Take 1 tablet by mouth 2 (two) times daily. Has 17 doses left   Vitamin B-12 5000 MCG Tbdp Take 5,000 mcg by mouth daily.   vitamin C 500 MG tablet Commonly known as:  ASCORBIC ACID Take 1,000 mg by mouth daily.   Vitamin D3 1000 units Caps Take 1,000 Units by mouth daily.       Vitals:   07/13/17 0438 07/13/17 1300  BP: 136/74 132/72  Pulse: 92 87  Resp: 18 17  Temp: 98.1 F (36.7 C) 98.3 F (36.8 C)  SpO2: 98% 96%    Skin clean, dry and  intact without evidence of skin break down, no evidence of skin tears noted. Right leg is wrapped in ACE wrap, with prevena vac in place. Patient was educated on prevena wound vac and to charge it nightly. IV catheter discontinued intact. Site without signs and symptoms of complications. Dressing and pressure applied. Pt denies pain at this time. No complaints noted.  An After Visit Summary and prescriptions were printed and given to the patient. Patient escorted via [place], and D/C home via private auto.  Hart RN

## 2017-07-13 NOTE — Discharge Summary (Signed)
Physician Discharge Summary  Patient ID: [name] MRN: [id number] DOB/AGE: Nov 21, 1940 77 y.o.  Admit date: 07/10/2017 Discharge date: 07/14/2017  Admission Diagnoses:  Failed total hip arthroplasty, initial encounter [facility])  Discharge Diagnoses:  Principal Problem:   Failed total hip arthroplasty, initial encounter [facility])   Past Medical History:  Diagnosis Date  . Allergic asthma   . Barrett's esophagus   . BPH (benign prostatic hyperplasia)   . CAD (coronary artery disease)   . COPD (chronic obstructive pulmonary disease) (HCC)   . GERD (gastroesophageal reflux disease)   . Hiatal hernia   . Hyperlipidemia   . Hypothyroidism   . Psoriasis   . Sleep apnea    wears CPAP nightly    Surgeries: Procedure(s): RIGHT TOTAL HIP REVISION FEMORAL COMPONENT on 07/10/2017   Consultants (if any):   Discharged Condition: Improved  Hospital Course: RAEDEN PASKINS is an 77 y.o. male who was admitted 07/10/2017 with a diagnosis of Failed total hip arthroplasty, initial encounter (HCC) and went to the operating room on 07/10/2017 and underwent the above named procedures.    He was given perioperative antibiotics:  Anti-infectives (From admission, onward)   Start     Dose/Rate Route Frequency Ordered Stop   07/10/17 2200  doxycycline (VIBRA-TABS) tablet 100 mg  Status:  Discontinued    Comments:  Has 17 doses left     100 mg Oral 2 times daily 07/10/17 1649 07/13/17 1953   07/10/17 2200  sulfamethoxazole-trimethoprim (BACTRIM DS,SEPTRA DS) 800-160 MG per tablet 1 tablet  Status:  Discontinued    Comments:  Has 17 doses left     1 tablet Oral 2 times daily 07/10/17 1649 07/13/17 1953   07/10/17 2030  vancomycin (VANCOCIN) IVPB 1000 mg/200 mL premix     1,000 mg 200 mL/hr over 60 Minutes Intravenous Every 12 hours 07/10/17 1649 07/10/17 2140   07/10/17 1030  vancomycin (VANCOCIN) 1,500 mg in sodium chloride 0.9 % 500 mL IVPB     1,500 mg 250 mL/hr over 120 Minutes Intravenous To  Surgery 07/09/17 1825 07/10/17 1112    .  He was given sequential compression devices, early ambulation, and apixaban for DVT prophylaxis.  He benefited maximally from the hospital stay and there were no complications.    Recent vital signs:  Vitals:   07/13/17 0438 07/13/17 1300  BP: 136/74 132/72  Pulse: 92 87  Resp: 18 17  Temp: 98.1 F (36.7 C) 98.3 F (36.8 C)  SpO2: 98% 96%    Recent laboratory studies:  Lab Results  Component Value Date   HGB 10.8 (L) 07/13/2017   HGB 10.0 (L) 07/12/2017   HGB 9.4 (L) 07/11/2017   Lab Results  Component Value Date   WBC 7.1 07/13/2017   PLT 207 07/13/2017   Lab Results  Component Value Date   INR 1.05 08/15/2014   Lab Results  Component Value Date   NA 136 07/11/2017   K 4.3 07/11/2017   CL 105 07/11/2017   CO2 23 07/11/2017   BUN 10 07/11/2017   CREATININE 0.93 07/11/2017   GLUCOSE 196 (H) 07/11/2017    Discharge Medications:   Allergies as of 07/13/2017   No Known Allergies     Medication List    STOP taking these medications   acetaminophen 500 MG tablet Commonly known as:  TYLENOL   aspirin 81 MG chewable tablet   betamethasone dipropionate 0.05 % cream Commonly known as:  DIPROLENE     TAKE these medications  albuterol 108 (90 Base) MCG/ACT inhaler Commonly known as:  PROVENTIL HFA;VENTOLIN HFA Inhale 1-2 puffs into the lungs every 6 (six) hours as needed for wheezing or shortness of breath.   amLODipine 5 MG tablet Commonly known as:  NORVASC Take 1 tablet (5 mg total) by mouth daily.   apixaban 2.5 MG Tabs tablet Commonly known as:  ELIQUIS Take 1 tablet (2.5 mg total) by mouth every 12 (twelve) hours.   atorvastatin 40 MG tablet Commonly known as:  LIPITOR Take 40 mg by mouth every evening.   cycloSPORINE 0.05 % ophthalmic emulsion Commonly known as:  RESTASIS Place 1 drop into both eyes 2 (two) times daily.   docusate sodium 100 MG capsule Commonly known as:  COLACE Take 1 capsule  (100 mg total) by mouth 2 (two) times daily.   doxycycline 100 MG capsule Commonly known as:  VIBRAMYCIN Take 100 mg by mouth 2 (two) times daily. Has 17 doses left   fexofenadine 180 MG tablet Commonly known as:  ALLEGRA Take 180 mg by mouth daily.   fluticasone 0.05 % cream Commonly known as:  CUTIVATE Apply 1 application topically 2 (two) times daily.   furosemide 20 MG tablet Commonly known as:  LASIX Take 2 tablets (40 mg total) by mouth daily. What changed:  when to take this   GLUCOSAMINE 1500 COMPLEX PO Take 2 capsules by mouth daily.   HYDROcodone-acetaminophen 5-325 MG tablet Commonly known as:  NORCO/VICODIN Take 1-2 tablets by mouth every 6 (six) hours as needed for moderate pain. What changed:    when to take this  reasons to take this   KLOR-CON M10 10 MEQ tablet Generic drug:  potassium chloride TAKE 1 TABLET (10 MEQ TOTAL) BY MOUTH DAILY.   levothyroxine 137 MCG tablet Commonly known as:  SYNTHROID, LEVOTHROID Take 137 mcg by mouth daily before breakfast.   Lutein-Zeaxanthin 25-5 MG Caps Take 1 capsule by mouth daily.   nitroGLYCERIN 0.4 MG/SPRAY spray Commonly known as:  NITROLINGUAL Place 1 spray under the tongue every 5 (five) minutes x 3 doses as needed for chest pain. Use as directed as needed   Omega-3 1000 MG Caps Take 1000 mg by mouth once daily   ondansetron 4 MG tablet Commonly known as:  ZOFRAN Take 1 tablet (4 mg total) by mouth every 6 (six) hours as needed for nausea.   OVER THE COUNTER MEDICATION Take 1 tablet by mouth daily. Prostate Plus Supplement - "Cranberry on Steroids"   pantoprazole 40 MG tablet Commonly known as:  PROTONIX Take 30-60 min before first meal of the day What changed:    how much to take  how to take this  when to take this  additional instructions   SENIOR MULTIVITAMIN PLUS PO Take 1 tablet by mouth daily.   senna 8.6 MG Tabs tablet Commonly known as:  SENOKOT Take 2 tablets (17.2 mg total)  by mouth at bedtime.   sildenafil 100 MG tablet Commonly known as:  VIAGRA Take 100 mg by mouth daily as needed for erectile dysfunction.   sulfamethoxazole-trimethoprim 800-160 MG tablet Commonly known as:  BACTRIM DS,SEPTRA DS Take 1 tablet by mouth 2 (two) times daily. Has 17 doses left   Vitamin B-12 5000 MCG Tbdp Take 5,000 mcg by mouth daily.   vitamin C 500 MG tablet Commonly known as:  ASCORBIC ACID Take 1,000 mg by mouth daily.   Vitamin D3 1000 units Caps Take 1,000 Units by mouth daily.  Diagnostic Studies: Dg Chest 2 View  Result Date: 06/29/2017 CLINICAL DATA:  Lower extremity swelling EXAM: CHEST  2 VIEW COMPARISON:  09/11/2016 FINDINGS: Mild chronic bronchitic changes. No acute consolidation or pleural effusion. Cardiomediastinal silhouette within normal limits. Aortic atherosclerosis. No pneumothorax. Degenerative changes of the spine. IMPRESSION: No active cardiopulmonary disease. Electronically Signed   By: Jasmine Pang M.D.   On: 06/29/2017 23:32   Dg Pelvis Portable  Result Date: 07/10/2017 CLINICAL DATA:  Status post right hip replacement. EXAM: PORTABLE PELVIS 1-2 VIEWS COMPARISON:  Fluoroscopic images of same day. Radiograph of June 22, 2017. FINDINGS: The femoral and acetabular components appear to be well situated. No fracture or dislocation is noted. Expected postoperative changes are noted in the surrounding soft tissues. IMPRESSION: Status post right total hip arthroplasty. Electronically Signed   By: Lupita Raider, M.D.   On: 07/10/2017 14:55   Dg Pelvis Portable  Result Date: 06/22/2017 CLINICAL DATA:  Status post right hip arthroplasty. EXAM: PORTABLE PELVIS 1-2 VIEWS COMPARISON:  Intraoperative imaging of earlier in the day. FINDINGS: Single AP view of the pelvis demonstrates right hip arthroplasty. No periprosthetic fractures or other acute complication. Mild left hip osteoarthritis. IMPRESSION: Expected appearance after right hip  arthroplasty. Electronically Signed   By: Jeronimo Greaves M.D.   On: 06/22/2017 14:19   Ct Hip Right Wo Contrast  Result Date: 07/07/2017 CLINICAL DATA:  Right hip pain. Right hip of placement two weeks ago. EXAM: CT OF THE RIGHT HIP WITHOUT CONTRAST TECHNIQUE: Multidetector CT imaging of the right hip was performed according to the standard protocol. Multiplanar CT image reconstructions were also generated. COMPARISON:  Right hip x-rays dated June 22, 2017. FINDINGS: Bones/Joint/Cartilage Prior right total hip arthroplasty. New prominent subsidence of the femoral stem measuring 16 mm. No acute fracture or dislocation. Ligaments Suboptimally assessed by CT. Muscles and Tendons Deep to the tensor fascia lata, there is a 5.5 x 7.4 x 7.0 cm (AP by transverse by CC) slightly hyperdense fluid collection, likely hematoma. No muscle atrophy. Soft tissues Incompletely visualized 4.4 x 5.0 x 5.0 cm (AP by transverse by CC) subcutaneous low-density fluid collection along the anterior hip at the level of the anterior inferior iliac spine, likely a seroma. Mild subcutaneous edema along the lateral hip. IMPRESSION: 1. Prior right total hip arthroplasty with new prominent subsidence of the femoral stem, measuring 16 mm, concerning for loosening. 2. 7.4 cm slightly hyperdense fluid collection deep to the tensor fascia lata, likely a hematoma. 3. Incompletely visualized 5.0 cm subcutaneous low-density fluid collection along the anterior hip at the level of the anterior inferior iliac spine, likely a seroma. Electronically Signed   By: Obie Dredge M.D.   On: 07/07/2017 16:40   US Venous Img Lower Bilateral  Result Date: 06/30/2017 CLINICAL DATA:  Bilateral lower extremity pain and edema right greater than left with recent hip surgery EXAM: BILATERAL LOWER EXTREMITY VENOUS DOPPLER ULTRASOUND TECHNIQUE: Gray-scale sonography with graded compression, as well as color Doppler and duplex ultrasound were performed to evaluate  the lower extremity deep venous systems from the level of the common femoral vein and including the common femoral, femoral, profunda femoral, popliteal and calf veins including the posterior tibial, peroneal and gastrocnemius veins when visible. The superficial great saphenous vein was also interrogated. Spectral Doppler was utilized to evaluate flow at rest and with distal augmentation maneuvers in the common femoral, femoral and popliteal veins. COMPARISON:  11/12/2015 FINDINGS: RIGHT LOWER EXTREMITY Common Femoral Vein: No evidence of thrombus.  Normal compressibility, respiratory phasicity and response to augmentation. Saphenofemoral Junction: No evidence of thrombus. Normal compressibility and flow on color Doppler imaging. Profunda Femoral Vein: No evidence of thrombus. Normal compressibility and flow on color Doppler imaging. Femoral Vein: No evidence of thrombus. Normal compressibility, respiratory phasicity and response to augmentation. Popliteal Vein: No evidence of thrombus. Normal compressibility, respiratory phasicity and response to augmentation. Calf Veins: No evidence of thrombus. Normal compressibility and flow on color Doppler imaging. Peroneal vein not well seen. Superficial Great Saphenous Vein: No evidence of thrombus. Normal compressibility. Venous Reflux:  None. Other Findings:  None. LEFT LOWER EXTREMITY Common Femoral Vein: No evidence of thrombus. Normal compressibility, respiratory phasicity and response to augmentation. Saphenofemoral Junction: No evidence of thrombus. Normal compressibility and flow on color Doppler imaging. Profunda Femoral Vein: No evidence of thrombus. Normal compressibility and flow on color Doppler imaging. Femoral Vein: No evidence of thrombus. Normal compressibility, respiratory phasicity and response to augmentation. Popliteal Vein: No evidence of thrombus. Normal compressibility, respiratory phasicity and response to augmentation. Calf Veins: No evidence of  thrombus. Normal compressibility and flow on color Doppler imaging. Peroneal vein not well seen. Superficial Great Saphenous Vein: No evidence of thrombus. Normal compressibility. Venous Reflux:  None. Other Findings:  None. IMPRESSION: No evidence of deep venous thrombosis. Electronically Signed   By: Jasmine Pang M.D.   On: 06/30/2017 00:42   Dg C-arm 1-60 Min  Result Date: 07/10/2017 CLINICAL DATA:  Intraoperative fluoroscopy for right total hip arthroplasty. EXAM: DG C-ARM 61-120 MIN; OPERATIVE RIGHT HIP WITH PELVIS COMPARISON:  Prior CT from 07/07/2017. FINDINGS: Single spot intraoperative fluoroscopic view of the femoral stem of a right total hip arthroplasty in AP projection. Femoral stem in place and appears normally aligned. No adverse features. IMPRESSION: Intraoperative fluoroscopy of femoral stem of right total hip arthroplasty without adverse features. Electronically Signed   By: Rise Mu M.D.   On: 07/10/2017 13:28   Dg C-arm 1-60 Min  Result Date: 06/22/2017 CLINICAL DATA:  77 year old male undergoing anterior right hip arthroplasty. EXAM: OPERATIVE RIGHT HIP (WITH PELVIS IF PERFORMED) 2 VIEWS TECHNIQUE: Fluoroscopic spot image(s) were submitted for interpretation post-operatively. COMPARISON:  Abdomen and pelvis radiographs 06/26/2010. FLUOROSCOPY TIME:  0 min 25 sec FINDINGS: 2 intraoperative fluoroscopic spot images of the lower pelvis and right hip in the AP projection. Bipolar hip arthroplasty is in place and appears normally aligned in the AP view. The hardware components appear intact. No unexpected osseous changes identified. IMPRESSION: Intraoperative fluoroscopy of bipolar right hip arthroplasty with no adverse features. Electronically Signed   By: Odessa Fleming M.D.   On: 06/22/2017 13:53   Dg C-arm 1-60 Min  Result Date: 06/22/2017 CLINICAL DATA:  77 year old male undergoing anterior right hip arthroplasty. EXAM: OPERATIVE RIGHT HIP (WITH PELVIS IF PERFORMED) 2 VIEWS  TECHNIQUE: Fluoroscopic spot image(s) were submitted for interpretation post-operatively. COMPARISON:  Abdomen and pelvis radiographs 06/26/2010. FLUOROSCOPY TIME:  0 min 25 sec FINDINGS: 2 intraoperative fluoroscopic spot images of the lower pelvis and right hip in the AP projection. Bipolar hip arthroplasty is in place and appears normally aligned in the AP view. The hardware components appear intact. No unexpected osseous changes identified. IMPRESSION: Intraoperative fluoroscopy of bipolar right hip arthroplasty with no adverse features. Electronically Signed   By: Odessa Fleming M.D.   On: 06/22/2017 13:53   Dg Hip Operative Unilat W Or W/o Pelvis Right  Result Date: 07/10/2017 CLINICAL DATA:  Intraoperative fluoroscopy for right total hip arthroplasty. EXAM: DG C-ARM 61-120  MIN; OPERATIVE RIGHT HIP WITH PELVIS COMPARISON:  Prior CT from 07/07/2017. FINDINGS: Single spot intraoperative fluoroscopic view of the femoral stem of a right total hip arthroplasty in AP projection. Femoral stem in place and appears normally aligned. No adverse features. IMPRESSION: Intraoperative fluoroscopy of femoral stem of right total hip arthroplasty without adverse features. Electronically Signed   By: Rise Mu M.D.   On: 07/10/2017 13:28   Dg Hip Operative Unilat W Or W/o Pelvis Right  Result Date: 06/22/2017 CLINICAL DATA:  77 year old male undergoing anterior right hip arthroplasty. EXAM: OPERATIVE RIGHT HIP (WITH PELVIS IF PERFORMED) 2 VIEWS TECHNIQUE: Fluoroscopic spot image(s) were submitted for interpretation post-operatively. COMPARISON:  Abdomen and pelvis radiographs 06/26/2010. FLUOROSCOPY TIME:  0 min 25 sec FINDINGS: 2 intraoperative fluoroscopic spot images of the lower pelvis and right hip in the AP projection. Bipolar hip arthroplasty is in place and appears normally aligned in the AP view. The hardware components appear intact. No unexpected osseous changes identified. IMPRESSION: Intraoperative  fluoroscopy of bipolar right hip arthroplasty with no adverse features. Electronically Signed   By: Odessa Fleming M.D.   On: 06/22/2017 13:53    Disposition:    Discharge Instructions    Call MD / Call 911   Complete by:  As directed    If you experience chest pain or shortness of breath, CALL 911 and be transported to the hospital emergency room.  If you develope a fever above 101 F, pus (white drainage) or increased drainage or redness at the wound, or calf pain, call your surgeon's office.   Constipation Prevention   Complete by:  As directed    Drink plenty of fluids.  Prune juice may be helpful.  You may use a stool softener, such as Colace (over the counter) 100 mg twice a day.  Use MiraLax (over the counter) for constipation as needed.   Diet - low sodium heart healthy   Complete by:  As directed    Discharge instructions   Complete by:  As directed    Keep VAC dressing clean and dry.  Do not remove dressing. Charge VAC unit nightly. Elevate toes above nose to reduce swelling. Keep follow up appointment with wound care.   Driving restrictions   Complete by:  As directed    No driving for 6 weeks   Increase activity slowly as tolerated   Complete by:  As directed    Lifting restrictions   Complete by:  As directed    No lifting for 6 weeks      Follow-up Information    Omarian Jaquith, Arlys John, MD. Schedule an appointment as soon as possible for a visit on 07/17/2017.   Specialty:  Orthopedic Surgery Why:  [facility] removal Contact information: [address] [place] [address] [phone]        Home, [facility] Follow up.   Specialty:  Home Health Services Why:  A representative from [facility] will contact you to arrange start date and time for your therapy. Contact information: [address] [place] [address] [phone]            Signed: Iline Oven Hanako Tipping 07/14/2017, 5:01 PM

## 2017-07-13 NOTE — Progress Notes (Signed)
Physical Therapy Treatment Patient Details Name: [name] MRN: [id number] DOB: 1941/03/15 Today's Date: 07/13/2017    History of Present Illness 77 y.o. male s/p R THA Revision 07/10/17. Pt with significant PMH of COPD, CAD, Barrett's esophagus, L knee arthroscopy, and coronary angio with stent.    PT Comments    Patient progressing well with therapy today. Pt able to ambulate further distances with supervision and improved mechanics. Patient is adhering to hip precautions but when asked only recalls 1/3. Pt has been educated on precautions multiple sessions now but appears to be having a challenge retaining info, will re emphasize next PT visit.      Follow Up Recommendations  Follow surgeon's recommendation for DC plan and follow-up therapies     Equipment Recommendations  None recommended by PT    Recommendations for Other Services       Precautions / Restrictions Precautions Precautions: Fall;Posterior Hip Precaution Booklet Issued: Yes (comment) Precaution Comments: pt only able to recall 1/3 precautions Restrictions Weight Bearing Restrictions: Yes RLE Weight Bearing: Weight bearing as tolerated    Mobility  Bed Mobility Overal bed mobility: Modified Independent             General bed mobility comments: HOB elevated all the way up  Transfers Overall transfer level: Needs assistance Equipment used: Rolling walker (2 wheeled) Transfers: Sit to/from Stand Sit to Stand: Supervision         General transfer comment: bed raised to adhere to precautions, v/c's to push up from bed and minimize hip flexion  Ambulation/Gait Ambulation/Gait assistance: Supervision Ambulation Distance (Feet): 250 Feet Assistive device: Rolling walker (2 wheeled) Gait Pattern/deviations: Step-through pattern;Trunk flexed Gait velocity: fast   General Gait Details: pt with slower and safer gait today, increased activity tolerence with more distance.    Stairs             Wheelchair Mobility    Modified Rankin (Stroke Patients Only)       Balance Overall balance assessment: Needs assistance Sitting-balance support: No upper extremity supported Sitting balance-Leahy Scale: Good     Standing balance support: No upper extremity supported Standing balance-Leahy Scale: Fair Standing balance comment: able to stand and urinate and stand at sink and wash hands without difficulty                            Cognition Arousal/Alertness: Awake/alert Behavior During Therapy: Impulsive Overall Cognitive Status: Within Functional Limits for tasks assessed                                 General Comments: with v/c's pt slows down, pt habitually fast      Exercises      General Comments        Pertinent Vitals/Pain Pain Assessment: Faces Faces Pain Scale: Hurts little more Pain Location: R hip Pain Descriptors / Indicators: Aching Pain Intervention(s): Limited activity within patient's tolerance;Monitored during session;Premedicated before session;Repositioned    Home Living                      Prior Function            PT Goals (current goals can now be found in the care plan section) Acute Rehab PT Goals PT Goal Formulation: With patient Time For Goal Achievement: 07/18/17 Potential to Achieve Goals: Good Progress towards PT goals: Progressing  toward goals    Frequency    7X/week      PT Plan Current plan remains appropriate    Co-evaluation              AM-PAC PT "6 Clicks" Daily Activity  Outcome Measure  Difficulty turning over in bed (including adjusting bedclothes, sheets and blankets)?: A Little Difficulty moving from lying on back to sitting on the side of the bed? : A Little Difficulty sitting down on and standing up from a chair with arms (e.g., wheelchair, bedside commode, etc,.)?: A Little Help needed moving to and from a bed to chair (including a wheelchair)?: A  Little Help needed walking in hospital room?: A Little Help needed climbing 3-5 steps with a railing? : A Little 6 Click Score: 18    End of Session Equipment Utilized During Treatment: Gait belt Activity Tolerance: Patient tolerated treatment well Patient left: in chair;with call bell/phone within reach;with family/visitor present Nurse Communication: Mobility status PT Visit Diagnosis: Unsteadiness on feet (R26.81);Other abnormalities of gait and mobility (R26.89);Pain Pain - Right/Left: Right Pain - part of body: Hip     Time: 8110-3159 PT Time Calculation (min) (ACUTE ONLY): 26 min  Charges:  $Gait Training: 8-22 mins                    G Codes:       Reinaldo Berber, PT, DPT Acute Rehab Services Pager: [phone]     Reinaldo Berber 07/13/2017, 9:20 AM

## 2017-07-13 NOTE — Progress Notes (Signed)
[name], [name] ([id number]) Visit Report for 07/09/2017 Arrival Information Details Patient Name: [name], [name]. Date of Service: 07/09/2017 10:30 AM Medical Record Number: [id number] Patient Account Number: [id number] Date of Birth/Sex: April 08, 1941 (77 y.o. Male) Treating RN: Montey Hora Primary Care Ramone Gander: Deland Pretty Other Clinician: Referring Asad Keeven: Deland Pretty Treating Marley Pakula/Extender: Melburn Hake, HOYT Weeks in Treatment: 1 Visit Information History Since Last Visit Added or deleted any medications: No Patient Arrived: Walker Any new allergies or adverse reactions: No Arrival Time: 10:46 Had a fall or experienced change in No Accompanied By: spouse activities of daily living that may affect Transfer Assistance: None risk of falls: Patient Identification Verified: Yes Signs or symptoms of abuse/neglect since last visito No Secondary Verification Process Yes Hospitalized since last visit: No Completed: Has Dressing in Place as Prescribed: Yes Patient Has Alerts: Yes Has Compression in Place as Prescribed: Yes Patient Alerts: Patient on Blood Pain Present Now: No Thinner 2 aspirin 81 mg Electronic Signature(s) Signed: 07/09/2017 3:33:05 PM By: Montey Hora Entered By: Montey Hora on 07/09/2017 10:47:46 Betzold, [name] ([id number]) -------------------------------------------------------------------------------- Clinic Level of Care Assessment Details Patient Name: [name] Date of Service: 07/09/2017 10:30 AM Medical Record Number: [id number] Patient Account Number: [id number] Date of Birth/Sex: 14-Nov-1940 (77 y.o. Male) Treating RN: Carolyne Fiscal, Debi Primary Care Argel Pablo: Deland Pretty Other Clinician: Referring Lateefa Crosby: Deland Pretty Treating Brysyn Brandenberger/Extender: Melburn Hake, HOYT Weeks in Treatment: 1 Clinic Level of Care Assessment Items TOOL 4 Quantity Score X - Use when only an EandM is performed on FOLLOW-UP visit 1 0 ASSESSMENTS -  Nursing Assessment / Reassessment X - Reassessment of Co-morbidities (includes updates in patient status) 1 10 X- 1 5 Reassessment of Adherence to Treatment Plan ASSESSMENTS - Wound and Skin Assessment / Reassessment X - Simple Wound Assessment / Reassessment - one wound 1 5 []  - 0 Complex Wound Assessment / Reassessment - multiple wounds []  - 0 Dermatologic / Skin Assessment (not related to wound area) ASSESSMENTS - Focused Assessment []  - Circumferential Edema Measurements - multi extremities 0 []  - 0 Nutritional Assessment / Counseling / Intervention []  - 0 Lower Extremity Assessment (monofilament, tuning fork, pulses) []  - 0 Peripheral Arterial Disease Assessment (using hand held doppler) ASSESSMENTS - Ostomy and/or Continence Assessment and Care []  - Incontinence Assessment and Management 0 []  - 0 Ostomy Care Assessment and Management (repouching, etc.) PROCESS - Coordination of Care []  - Simple Patient / Family Education for ongoing care 0 X- 1 20 Complex (extensive) Patient / Family Education for ongoing care X- 1 10 Staff obtains Programmer, systems, Records, Test Results / Process Orders X- 1 10 Staff telephones HHA, Nursing Homes / Clarify orders / etc []  - 0 Routine Transfer to another Facility (non-emergent condition) []  - 0 Routine Hospital Admission (non-emergent condition) []  - 0 New Admissions / Biomedical engineer / Ordering NPWT, Apligraf, etc. []  - 0 Emergency Hospital Admission (emergent condition) X- 1 10 Simple Discharge Coordination ADEN, SEK. ([id number]) []  - 0 Complex (extensive) Discharge Coordination PROCESS - Special Needs []  - Pediatric / Minor Patient Management 0 []  - 0 Isolation Patient Management []  - 0 Hearing / Language / Visual special needs []  - 0 Assessment of Community assistance (transportation, D/C planning, etc.) []  - 0 Additional assistance / Altered mentation []  - 0 Support Surface(s) Assessment (bed, cushion, seat,  etc.) INTERVENTIONS - Wound Cleansing / Measurement X - Simple Wound Cleansing - one wound 1 5 []  - 0 Complex Wound Cleansing - multiple wounds X- 1 5  acute pain when surgical debridement is performed. If needed, Patient is instructed to use over the counter pain medication for the following 24-48 hours after debridement. Wound care MDs do not prescribed pain medications. Patient has chronic pain or uncontrolled pain. Patient has been instructed to make an appointment with their Primary Care Physician for pain management. Electronic Signature(s) Signed: 07/09/2017 3:33:05 PM By: Montey Hora Entered By: Montey Hora on 07/09/2017 10:47:56 Pintor, [name] ([id number]) -------------------------------------------------------------------------------- Wound Assessment Details Patient Name: [name] Date of Service: 07/09/2017 10:30 AM Medical Record Number: [id number] Patient Account Number: [id number] Date of Birth/Sex: [birth year] (77 y.o. Male) Treating RN: Montey Hora Primary Care Marcelle Bebout: Deland Pretty Other Clinician: Referring Seaira Byus: Deland Pretty Treating Keyarra Rendall/Extender: Melburn Hake, HOYT Weeks in Treatment: 1 Wound Status Wound Number: 1 Primary Venous Leg Ulcer Etiology: Wound Location: Right Lower Leg - Anterior Wound Open Wounding Event: Blister Status: Date Acquired: 06/29/2017 Comorbid Chronic Obstructive Pulmonary Disease Weeks Of Treatment: 0 History: (COPD), Sleep Apnea, Coronary Artery Disease, Clustered Wound: No Hypertension, Osteoarthritis, Received Radiation Photos Photo Uploaded By: Montey Hora on 07/09/2017 11:33:10 Wound Measurements Length: (cm) 3.9 Width: (cm) 4.1 Depth: (cm) 0.1 Area: (cm) 12.559 Volume: (cm) 1.256 % Reduction in Area: % Reduction in Volume: Epithelialization: Large (67-100%) Tunneling: No Undermining: No Wound Description Classification: Partial Thickness Wound Margin: Flat and Intact Exudate Amount: Large Exudate Type: Serous Exudate Color: amber Foul Odor After Cleansing: No Slough/Fibrino No Wound Bed Granulation Amount: Large (67-100%) Exposed Structure Granulation Quality: Pink Fascia Exposed: No Necrotic Amount: None Present (0%) Fat Layer (Subcutaneous Tissue) Exposed: No Tendon Exposed: No Muscle Exposed: No Joint Exposed: No Bone Exposed: No Periwound Skin Texture Bjorn, Saifullah W. ([id number]) Texture Color No Abnormalities Noted: No No Abnormalities Noted: No Callus: No Atrophie Blanche: No Crepitus: No Cyanosis: No Excoriation: No Ecchymosis: No Induration:  No Erythema: No Rash: No Hemosiderin Staining: No Scarring: No Mottled: No Pallor: No Moisture Rubor: No No Abnormalities Noted: No Dry / Scaly: No Temperature / Pain Maceration: No Temperature: No Abnormality Wound Preparation Ulcer Cleansing: Rinsed/Irrigated with Saline Topical Anesthetic Applied: Other: lidocaine 4%, Electronic Signature(s) Signed: 07/09/2017 3:33:05 PM By: Montey Hora Entered By: Montey Hora on 07/09/2017 10:56:52 Bonn, [name] ([id number]) -------------------------------------------------------------------------------- Vitals Details Patient Name: [name] Date of Service: 07/09/2017 10:30 AM Medical Record Number: [id number] Patient Account Number: [id number] Date of Birth/Sex: 03/03/1941 (77 y.o. Male) Treating RN: Montey Hora Primary Care Dazani Norby: Deland Pretty Other Clinician: Referring Cleatus Goodin: Deland Pretty Treating Bannie Lobban/Extender: Melburn Hake, HOYT Weeks in Treatment: 1 Vital Signs Time Taken: 10:48 Temperature (F): 97.7 Height (in): 70 Pulse (bpm): 88 Weight (lbs): 258 Respiratory Rate (breaths/min): 20 Body Mass Index (BMI): 37 Blood Pressure (mmHg): 137/59 Reference Range: 80 - 120 mg / dl Electronic Signature(s) Signed: 07/09/2017 3:33:05 PM By: Montey Hora Entered By: Montey Hora on 07/09/2017 10:49:39  Wound Imaging (photographs - any number of wounds) []  - 0 Wound Tracing (instead of photographs) X- 1 5 Simple Wound Measurement - one wound []  - 0 Complex Wound Measurement - multiple wounds INTERVENTIONS - Wound Dressings []  - Small Wound Dressing one or multiple wounds 0 X- 1 15 Medium Wound Dressing one or multiple wounds []  - 0 Large Wound Dressing one or multiple wounds []  - 0 Application of Medications - topical []  - 0 Application of Medications - injection INTERVENTIONS - Miscellaneous []  - External ear exam 0 X- 1 5 Specimen Collection (cultures, biopsies, blood, body fluids, etc.) X- 1 5 Specimen(s) / Culture(s) sent or taken to Lab for analysis []  - 0 Patient Transfer (multiple staff / Civil Service fast streamer / Similar devices) []  - 0 Simple Staple / Suture removal (25 or less) []  - 0 Complex Staple / Suture removal (26 or more) []  - 0 Hypo / Hyperglycemic Management (close monitor of Blood Glucose) []  - 0 Ankle / Brachial Index (ABI) - do not check if billed separately X- 1 5 Vital Signs Gullion, Keyon W. ([id number]) Has the patient been seen at the hospital within the last three years: Yes Total Score: 115 Level Of Care: New/Established - Level 3 Electronic Signature(s) Signed: 07/09/2017 2:24:57 PM By: Alric Quan Entered By: Alric Quan on 07/09/2017 11:42:38 Cordle, [name] ([id number]) -------------------------------------------------------------------------------- Encounter Discharge Information Details Patient Name: [name] Date of Service: 07/09/2017 10:30 AM Medical Record Number: [id number] Patient Account Number: [id number] Date of Birth/Sex: [birth year] (77 y.o. Male) Treating RN: Ahmed Prima Primary Care Lassie Demorest: Deland Pretty Other Clinician: Referring Takoya Jonas: Deland Pretty Treating Amiah Frohlich/Extender: Melburn Hake, HOYT Weeks in Treatment: 1 Encounter Discharge Information Items Schedule Follow-up Appointment: No Medication Reconciliation completed and No provided to Patient/Care Surie Suchocki: Provided on Clinical Summary of Care: 07/09/2017 Form Type Recipient Paper Patient [facility] Electronic Signature(s) Signed: 07/13/2017 2:38:37 PM By: Ruthine Dose Entered By: Ruthine Dose on 07/09/2017 11:38:05 Vides, [name] ([id number]) -------------------------------------------------------------------------------- Lower Extremity Assessment Details Patient Name: [name] Date of Service: 07/09/2017 10:30 AM Medical Record Number: [id number] Patient Account Number: [id number] Date of Birth/Sex: October 20, 1940 (77 y.o. Male) Treating RN: Montey Hora Primary Care Stacie Knutzen: Deland Pretty Other Clinician: Referring Kareli Hossain: Deland Pretty Treating Rolla Servidio/Extender: Melburn Hake, HOYT Weeks in Treatment: 1 Edema Assessment Assessed: [Left: No] [Right: No] [Left: Edema] [Right: :] Calf Left: Right: Point of Measurement: 35 cm From Medial Instep 40.6 cm 41.5 cm Ankle Left: Right: Point of Measurement: 12 cm From Medial Instep 25.8 cm 28.1 cm Vascular Assessment Pulses: Dorsalis Pedis Palpable: [Left:Yes] [Right:Yes] Posterior Tibial Extremity colors, hair growth, and conditions: Extremity Color: [Left:Normal] [Right:Red] Hair Growth on Extremity: [Left:No] [Right:No] Temperature of Extremity: [Left:Warm] [Right:Warm] Capillary Refill: [Left:< 3 seconds] [Right:< 3 seconds] Electronic Signature(s) Signed: 07/09/2017 3:33:05 PM By: Montey Hora Entered By: Montey Hora on 07/09/2017 10:59:03 Amirault, [name] ([id number]) -------------------------------------------------------------------------------- Multi Wound Chart Details Patient Name: [name] Date of Service: 07/09/2017 10:30 AM Medical Record Number: [id number] Patient Account Number:  [id number] Date of Birth/Sex: 02-10-1941 (77 y.o. Male) Treating RN: Ahmed Prima Primary Care Elazar Argabright: Deland Pretty Other Clinician: Referring Jaiyana Canale: Deland Pretty Treating Rayburn Mundis/Extender: STONE III, HOYT Weeks in Treatment: 1 Vital Signs Height(in): 70 Pulse(bpm): 88 Weight(lbs): 258 Blood Pressure(mmHg): 137/59 Body Mass Index(BMI): 37 Temperature(F): 97.7 Respiratory Rate 20 (breaths/min): Photos: [1:No Photos] [N/A:N/A] Wound Location: [1:Right Lower Leg - Anterior] [N/A:N/A] Wounding Event: [1:Blister] [N/A:N/A] Primary Etiology: [1:Venous Leg Ulcer] [N/A:N/A] Comorbid History: [1:Chronic Obstructive Pulmonary Disease (COPD), Sleep  [name], [name] ([id number]) Visit Report for 07/09/2017 Arrival Information Details Patient Name: [name], [name]. Date of Service: 07/09/2017 10:30 AM Medical Record Number: [id number] Patient Account Number: [id number] Date of Birth/Sex: April 08, 1941 (77 y.o. Male) Treating RN: Montey Hora Primary Care Ramone Gander: Deland Pretty Other Clinician: Referring Asad Keeven: Deland Pretty Treating Marley Pakula/Extender: Melburn Hake, HOYT Weeks in Treatment: 1 Visit Information History Since Last Visit Added or deleted any medications: No Patient Arrived: Walker Any new allergies or adverse reactions: No Arrival Time: 10:46 Had a fall or experienced change in No Accompanied By: spouse activities of daily living that may affect Transfer Assistance: None risk of falls: Patient Identification Verified: Yes Signs or symptoms of abuse/neglect since last visito No Secondary Verification Process Yes Hospitalized since last visit: No Completed: Has Dressing in Place as Prescribed: Yes Patient Has Alerts: Yes Has Compression in Place as Prescribed: Yes Patient Alerts: Patient on Blood Pain Present Now: No Thinner 2 aspirin 81 mg Electronic Signature(s) Signed: 07/09/2017 3:33:05 PM By: Montey Hora Entered By: Montey Hora on 07/09/2017 10:47:46 Betzold, [name] ([id number]) -------------------------------------------------------------------------------- Clinic Level of Care Assessment Details Patient Name: [name] Date of Service: 07/09/2017 10:30 AM Medical Record Number: [id number] Patient Account Number: [id number] Date of Birth/Sex: 14-Nov-1940 (77 y.o. Male) Treating RN: Carolyne Fiscal, Debi Primary Care Argel Pablo: Deland Pretty Other Clinician: Referring Lateefa Crosby: Deland Pretty Treating Brysyn Brandenberger/Extender: Melburn Hake, HOYT Weeks in Treatment: 1 Clinic Level of Care Assessment Items TOOL 4 Quantity Score X - Use when only an EandM is performed on FOLLOW-UP visit 1 0 ASSESSMENTS -  Nursing Assessment / Reassessment X - Reassessment of Co-morbidities (includes updates in patient status) 1 10 X- 1 5 Reassessment of Adherence to Treatment Plan ASSESSMENTS - Wound and Skin Assessment / Reassessment X - Simple Wound Assessment / Reassessment - one wound 1 5 []  - 0 Complex Wound Assessment / Reassessment - multiple wounds []  - 0 Dermatologic / Skin Assessment (not related to wound area) ASSESSMENTS - Focused Assessment []  - Circumferential Edema Measurements - multi extremities 0 []  - 0 Nutritional Assessment / Counseling / Intervention []  - 0 Lower Extremity Assessment (monofilament, tuning fork, pulses) []  - 0 Peripheral Arterial Disease Assessment (using hand held doppler) ASSESSMENTS - Ostomy and/or Continence Assessment and Care []  - Incontinence Assessment and Management 0 []  - 0 Ostomy Care Assessment and Management (repouching, etc.) PROCESS - Coordination of Care []  - Simple Patient / Family Education for ongoing care 0 X- 1 20 Complex (extensive) Patient / Family Education for ongoing care X- 1 10 Staff obtains Programmer, systems, Records, Test Results / Process Orders X- 1 10 Staff telephones HHA, Nursing Homes / Clarify orders / etc []  - 0 Routine Transfer to another Facility (non-emergent condition) []  - 0 Routine Hospital Admission (non-emergent condition) []  - 0 New Admissions / Biomedical engineer / Ordering NPWT, Apligraf, etc. []  - 0 Emergency Hospital Admission (emergent condition) X- 1 10 Simple Discharge Coordination ADEN, SEK. ([id number]) []  - 0 Complex (extensive) Discharge Coordination PROCESS - Special Needs []  - Pediatric / Minor Patient Management 0 []  - 0 Isolation Patient Management []  - 0 Hearing / Language / Visual special needs []  - 0 Assessment of Community assistance (transportation, D/C planning, etc.) []  - 0 Additional assistance / Altered mentation []  - 0 Support Surface(s) Assessment (bed, cushion, seat,  etc.) INTERVENTIONS - Wound Cleansing / Measurement X - Simple Wound Cleansing - one wound 1 5 []  - 0 Complex Wound Cleansing - multiple wounds X- 1 5

## 2017-07-13 NOTE — Progress Notes (Signed)
    Subjective:  Patient reports pain as mild to moderate.  Denies N/V/CP/SOB. No c/o.  Objective:   VITALS:   Vitals:   07/12/17 1300 07/12/17 2020 07/13/17 0438 07/13/17 1300  BP: 122/66 (!) 147/75 136/74 132/72  Pulse: 97 99 92 87  Resp: 18 18 18 17   Temp: 98.1 F (36.7 C) 98.1 F (36.7 C) 98.1 F (36.7 C) 98.3 F (36.8 C)  TempSrc: Oral Oral Oral Oral  SpO2: 97% 97% 98% 96%  Weight:      Height:        NAD ABD soft Sensation intact distally Intact pulses distally Dorsiflexion/Plantar flexion intact Incision: dressing C/D/I Compartment soft   Lab Results  Component Value Date   WBC 7.1 07/13/2017   HGB 10.8 (L) 07/13/2017   HCT 34.4 (L) 07/13/2017   MCV 102.4 (H) 07/13/2017   PLT 207 07/13/2017   BMET    Component Value Date/Time   NA 136 07/11/2017 0557   NA 141 12/09/2016 0848   K 4.3 07/11/2017 0557   CL 105 07/11/2017 0557   CO2 23 07/11/2017 0557   GLUCOSE 196 (H) 07/11/2017 0557   BUN 10 07/11/2017 0557   BUN 13 12/09/2016 0848   CREATININE 0.93 07/11/2017 0557   CREATININE 0.83 12/28/2014 1518   CALCIUM 8.4 (L) 07/11/2017 0557   GFRNONAA >60 07/11/2017 0557   GFRAA >60 07/11/2017 0557     Assessment/Plan: 3 Days Post-Op   Active Problems:   Failed total hip arthroplasty, initial encounter ([place])   WBAT with walker DVT ppx: ASA, SCDs, TEDS PO pain control PT/OT Dispo: D/C home with HHPT, cont abx until blister healed   Hilton Cork Sergi Gellner 07/13/2017, 3:18 PM   Rod Can, MD Cell [phone]

## 2017-07-13 NOTE — Progress Notes (Signed)
Physical Therapy Treatment Patient Details Name: [name] MRN: [id number] DOB: 02-25-1941 Today's Date: 07/13/2017    History of Present Illness 77 y.o. male s/p R THA Revision 07/10/17. Pt with significant PMH of COPD, CAD, Barrett's esophagus, L knee arthroscopy, and coronary angio with stent.    PT Comments    Patient has progressed well with therapy. Now ambulating with improved mechanics and increased safety. Wife present this visit session focused on educating her on hip precautions and some of the points we've worked on with OOB safety for increased cary over at home. Pt scheduled to d/c this afternoon for HHPT, feel this is appropriate and pt will do well with HHPT.     Follow Up Recommendations  Follow surgeon's recommendation for DC plan and follow-up therapies     Equipment Recommendations  None recommended by PT    Recommendations for Other Services       Precautions / Restrictions Precautions Precautions: Fall;Posterior Hip Precaution Booklet Issued: Yes (comment) Precaution Comments: pt only able to recall 1/3 precautions Restrictions Weight Bearing Restrictions: Yes RLE Weight Bearing: Weight bearing as tolerated    Mobility  Bed Mobility Overal bed mobility: Modified Independent             General bed mobility comments: HOB elevated all the way up  Transfers Overall transfer level: Needs assistance Equipment used: Rolling walker (2 wheeled) Transfers: Sit to/from Stand Sit to Stand: Supervision         General transfer comment: bed raised to adhere to precautions, v/c's to push up from bed and minimize hip flexion  Ambulation/Gait Ambulation/Gait assistance: Supervision Ambulation Distance (Feet): 200 Feet Assistive device: Rolling walker (2 wheeled) Gait Pattern/deviations: Step-through pattern;Trunk flexed Gait velocity: fast   General Gait Details: discussed gait mechancis and safety considersatiions with patients wife as pt  walked, pt now with consistent step through pattern and better posture    Stairs            Wheelchair Mobility    Modified Rankin (Stroke Patients Only)       Balance Overall balance assessment: Needs assistance Sitting-balance support: No upper extremity supported Sitting balance-Leahy Scale: Good     Standing balance support: No upper extremity supported Standing balance-Leahy Scale: Fair Standing balance comment: able to stand and urinate and stand at sink and wash hands without difficulty                            Cognition Arousal/Alertness: Awake/alert Behavior During Therapy: Impulsive Overall Cognitive Status: Within Functional Limits for tasks assessed                                 General Comments: with v/c's pt slows down, pt habitually fast      Exercises      General Comments General comments (skin integrity, edema, etc.): Educated wife on 3/3 hip precautions this visit, as well as safety with RW and proxmity, patient has been doing better with RW.       Pertinent Vitals/Pain Pain Assessment: Faces Faces Pain Scale: Hurts little more Pain Location: R hip Pain Descriptors / Indicators: Aching Pain Intervention(s): Limited activity within patient's tolerance;Monitored during session;Premedicated before session;Repositioned    Home Living                      Prior Function  PT Goals (current goals can now be found in the care plan section) Acute Rehab PT Goals PT Goal Formulation: With patient Time For Goal Achievement: 07/18/17 Potential to Achieve Goals: Good Progress towards PT goals: Progressing toward goals    Frequency    7X/week      PT Plan Current plan remains appropriate    Co-evaluation              AM-PAC PT "6 Clicks" Daily Activity  Outcome Measure  Difficulty turning over in bed (including adjusting bedclothes, sheets and blankets)?: A Little Difficulty  moving from lying on back to sitting on the side of the bed? : A Little Difficulty sitting down on and standing up from a chair with arms (e.g., wheelchair, bedside commode, etc,.)?: A Little Help needed moving to and from a bed to chair (including a wheelchair)?: A Little Help needed walking in hospital room?: A Little Help needed climbing 3-5 steps with a railing? : A Little 6 Click Score: 18    End of Session Equipment Utilized During Treatment: Gait belt Activity Tolerance: Patient tolerated treatment well Patient left: in chair;with call bell/phone within reach;with family/visitor present Nurse Communication: Mobility status PT Visit Diagnosis: Unsteadiness on feet (R26.81);Other abnormalities of gait and mobility (R26.89);Pain Pain - Right/Left: Right Pain - part of body: Hip     Time: 1555-1610 PT Time Calculation (min) (ACUTE ONLY): 15 min  Charges:  $Gait Training: 8-22 mins                    G Codes:      Reinaldo Berber, PT, DPT Acute Rehab Services Pager: [phone]     Reinaldo Berber 07/13/2017, 4:14 PM

## 2017-07-13 NOTE — Care Management Note (Addendum)
Case Management Note  Patient Details  Name: [name] MRN: [id number] Date of Birth: Mar 24, 1941  Subjective/Objective:  77 yr old gentleman s/p right total hip revision.                   Action/Plan: Case manager spoke with patient and his wife concerning discharge plan and DME. Choice for Home Health agency was offere, referral was called to Christa See, [facility], Liaison. Patient has RW at home and doesn't need 3in1. He will have family support at discharge.   Expected Discharge Date:  07/13/17               Expected Discharge Plan:  [place]  In-House Referral:  NA  Discharge planning Services  CM Consult  Post Acute Care Choice:  Home Health Choice offered to:  Patient, Spouse  DME Arranged:    DME Agency:     HH Arranged:  PT HH Agency:  [facility] [employer]  Status of Service:  Completed, signed off  If discussed at [employer], dates discussed:    Additional Comments:  Ninfa Meeker, RN 07/13/2017, 3:43 PM

## 2017-07-15 LAB — AEROBIC/ANAEROBIC CULTURE W GRAM STAIN (SURGICAL/DEEP WOUND): Culture: NO GROWTH

## 2017-07-16 ENCOUNTER — Encounter (HOSPITAL_COMMUNITY): Payer: Self-pay | Admitting: Orthopedic Surgery

## 2017-07-20 ENCOUNTER — Encounter: Payer: Medicare PPO | Admitting: Physician Assistant

## 2017-07-20 DIAGNOSIS — L03115 Cellulitis of right lower limb: Secondary | ICD-10-CM | POA: Diagnosis not present

## 2017-07-21 NOTE — Progress Notes (Signed)
07/20/2017 15:18:51 [name], [name] ([id number]) -------------------------------------------------------------------------------- Wound Assessment Details Patient Name: [name], [name] Date of Service: 07/20/2017 1:15 PM Medical Record Number: [id number] Patient Account Number: [id number] Date of Birth/Sex: 07-04-1940 (77 y.o. M) Treating RN: Montey Hora Primary Care Ellenora Talton: Deland Pretty Other Clinician: Referring Gwendolen Hewlett: Deland Pretty Treating Milicent Acheampong/Extender: Melburn Hake, HOYT Weeks in Treatment: 2 Wound Status Wound Number: 1 Primary Etiology: Venous Leg Ulcer Wound  Location: Right, Anterior Lower Leg Wound Status: Open Wounding Event: Blister Date Acquired: 06/29/2017 Weeks Of Treatment: 1 Clustered Wound: No Photos Photo Uploaded By: Montey Hora on 07/20/2017 13:29:00 Wound Measurements Length: (cm) 0.1 Width: (cm) 0.1 Depth: (cm) 0.1 Area: (cm) 0.008 Volume: (cm) 0.001 % Reduction in Area: 99.9% % Reduction in Volume: 99.9% Wound Description Classification: Partial Thickness Periwound Skin Texture Texture Color No Abnormalities Noted: No No Abnormalities Noted: No Moisture No Abnormalities Noted: No Treatment Notes Wound #1 (Right, Anterior Lower Leg) 1. Cleansed with: Clean wound with Normal Saline 5. Secondary Dressing Applied ABD Pad 7. Secured with LAURIER, JASPERSON ([id number]) 3 Layer Compression System - Right Lower Extremity Electronic Signature(s) Signed: 07/20/2017 4:46:08 PM By: Montey Hora Entered By: Montey Hora on 07/20/2017 13:24:55 Pownall, [name] ([id number]) -------------------------------------------------------------------------------- Vitals Details Patient Name: [name] Date of Service: 07/20/2017 1:15 PM Medical Record Number: [id number] Patient Account Number: [id number] Date of Birth/Sex: 1940/07/06 (77 y.o. M) Treating RN: Montey Hora Primary Care Yaqueline Gutter: Deland Pretty Other Clinician: Referring Amarii Amy: Deland Pretty Treating Ernestene Coover/Extender: Melburn Hake, HOYT Weeks in Treatment: 2 Vital Signs Time Taken: 13:24 Temperature (F): 98.4 Height (in): 70 Pulse (bpm): 96 Weight (lbs): 258 Respiratory Rate (breaths/min): 16 Body Mass Index (BMI): 37 Blood Pressure (mmHg): 118/56 Reference Range: 80 - 120 mg / dl Electronic Signature(s) Signed: 07/20/2017 4:46:08 PM By: Montey Hora Entered By: Montey Hora on 07/20/2017 13:24:32  N/A Wound Status: Open N/A N/A Measurements L x W x D 0.1x0.1x0.1 N/A N/A (cm) Area (cm) : 0.008 N/A N/A Volume (cm) : 0.001 N/A N/A % Reduction in Area: 99.90% N/A N/A % Reduction in Volume: 99.90% N/A N/A Classification: Partial Thickness N/A N/A Periwound Skin Texture: No Abnormalities Noted N/A N/A Periwound Skin Moisture: No Abnormalities Noted N/A N/A Periwound Skin Color: No Abnormalities Noted N/A N/A Tenderness on Palpation: No N/A N/A Treatment Notes Electronic Signature(s) Signed: 07/20/2017 4:23:05 PM By: Roger Shelter Entered By: Roger Shelter on 07/20/2017 13:59:20 Nemes, [name] ([id number]) -------------------------------------------------------------------------------- Multi-Disciplinary Care Plan Details Patient Name: [name] Date of Service: 07/20/2017 1:15 PM Medical Record Number: [id number] Patient Account Number: [id number] Date of Birth/Sex: Jun 26, 1940 (77 y.o. M) Treating RN: Roger Shelter Primary Care Sonia Stickels: Deland Pretty Other Clinician: Referring Kayti Poss: Deland Pretty Treating David Rodriquez/Extender: Melburn Hake, HOYT Weeks in Treatment: 2 Active Inactive ` Abuse / Safety / Falls / Self Care Management Nursing Diagnoses: Potential for falls Goals: Patient will not experience any injury related to falls Date Initiated: 07/02/2017 Target  Resolution Date: 10/31/2017 Goal Status: Active Interventions: Podiatry chair, stretcher in low position and side rails up as needed Assess Activities of Daily Living upon admission and as needed Assess fall risk on admission and as needed Assess personal safety and home safety (as indicated) on admission and as needed Notes: ` Orientation to the Wound Care Program Nursing Diagnoses: Knowledge deficit related to the wound healing center program Goals: Patient/caregiver will verbalize understanding of the [place] Date Initiated: 07/02/2017 Target Resolution Date: 08/01/2017 Goal Status: Active Interventions: Provide education on orientation to the wound center Notes: ` Pain, Acute or Chronic Nursing Diagnoses: Pain, acute or chronic: actual or potential Potential alteration in comfort, pain Goals: Patient/caregiver will verbalize adequate pain control between visits HAYS, DUNNIGAN ([id number]) Date Initiated: 07/02/2017 Target Resolution Date: 10/31/2017 Goal Status: Active Interventions: Complete pain assessment as per visit requirements Notes: ` Wound/Skin Impairment Nursing Diagnoses: Impaired tissue integrity Knowledge deficit related to ulceration/compromised skin integrity Goals: Ulcer/skin breakdown will have a volume reduction of 80% by week 12 Date Initiated: 07/02/2017 Target Resolution Date: 10/03/2017 Goal Status: Active Interventions: Assess patient/caregiver ability to perform ulcer/skin care regimen upon admission and as needed Assess ulceration(s) every visit Notes: Electronic Signature(s) Signed: 07/20/2017 4:23:05 PM By: Roger Shelter Entered By: Roger Shelter on 07/20/2017 13:59:11 [place], [name] ([id number]) -------------------------------------------------------------------------------- Pain Assessment Details Patient Name: [name] Date of Service: 07/20/2017 1:15 PM Medical Record Number: [id number] Patient Account  Number: [id number] Date of Birth/Sex: 02-16-1941 (77 y.o. M) Treating RN: Montey Hora Primary Care Tidus Upchurch: Deland Pretty Other Clinician: Referring Marcine Gadway: Deland Pretty Treating Brit Carbonell/Extender: Melburn Hake, HOYT Weeks in Treatment: 2 Active Problems Location of Pain Severity and Description of Pain Patient Has Paino No Site Locations Pain Management and Medication Current Pain Management: Electronic Signature(s) Signed: 07/20/2017 4:46:08 PM By: Montey Hora Entered By: Montey Hora on 07/20/2017 13:22:15 [place], [name] ([id number]) -------------------------------------------------------------------------------- Patient/Caregiver Education Details Patient Name: [name] Date of Service: 07/20/2017 1:15 PM Medical Record Number: [id number] Patient Account Number: [id number] Date of Birth/Gender: 12-15-1940 (76 y.o. M) Treating RN: Montey Hora Primary Care Physician: Deland Pretty Other Clinician: Referring Physician: Deland Pretty Treating Physician/Extender: Sharalyn Ink in Treatment: 2 Education Assessment Education Provided To: Patient Education Topics Provided Venous: Handouts: Other: leg elevation Methods: Explain/Verbal Responses: State content correctly Electronic Signature(s) Signed: 07/20/2017 4:46:08 PM By: Montey Hora Entered By: Montey Hora on  07/20/2017 15:18:51 [name], [name] ([id number]) -------------------------------------------------------------------------------- Wound Assessment Details Patient Name: [name], [name] Date of Service: 07/20/2017 1:15 PM Medical Record Number: [id number] Patient Account Number: [id number] Date of Birth/Sex: 07-04-1940 (77 y.o. M) Treating RN: Montey Hora Primary Care Ellenora Talton: Deland Pretty Other Clinician: Referring Gwendolen Hewlett: Deland Pretty Treating Milicent Acheampong/Extender: Melburn Hake, HOYT Weeks in Treatment: 2 Wound Status Wound Number: 1 Primary Etiology: Venous Leg Ulcer Wound  Location: Right, Anterior Lower Leg Wound Status: Open Wounding Event: Blister Date Acquired: 06/29/2017 Weeks Of Treatment: 1 Clustered Wound: No Photos Photo Uploaded By: Montey Hora on 07/20/2017 13:29:00 Wound Measurements Length: (cm) 0.1 Width: (cm) 0.1 Depth: (cm) 0.1 Area: (cm) 0.008 Volume: (cm) 0.001 % Reduction in Area: 99.9% % Reduction in Volume: 99.9% Wound Description Classification: Partial Thickness Periwound Skin Texture Texture Color No Abnormalities Noted: No No Abnormalities Noted: No Moisture No Abnormalities Noted: No Treatment Notes Wound #1 (Right, Anterior Lower Leg) 1. Cleansed with: Clean wound with Normal Saline 5. Secondary Dressing Applied ABD Pad 7. Secured with LAURIER, JASPERSON ([id number]) 3 Layer Compression System - Right Lower Extremity Electronic Signature(s) Signed: 07/20/2017 4:46:08 PM By: Montey Hora Entered By: Montey Hora on 07/20/2017 13:24:55 Pownall, [name] ([id number]) -------------------------------------------------------------------------------- Vitals Details Patient Name: [name] Date of Service: 07/20/2017 1:15 PM Medical Record Number: [id number] Patient Account Number: [id number] Date of Birth/Sex: 1940/07/06 (77 y.o. M) Treating RN: Montey Hora Primary Care Yaqueline Gutter: Deland Pretty Other Clinician: Referring Amarii Amy: Deland Pretty Treating Ernestene Coover/Extender: Melburn Hake, HOYT Weeks in Treatment: 2 Vital Signs Time Taken: 13:24 Temperature (F): 98.4 Height (in): 70 Pulse (bpm): 96 Weight (lbs): 258 Respiratory Rate (breaths/min): 16 Body Mass Index (BMI): 37 Blood Pressure (mmHg): 118/56 Reference Range: 80 - 120 mg / dl Electronic Signature(s) Signed: 07/20/2017 4:46:08 PM By: Montey Hora Entered By: Montey Hora on 07/20/2017 13:24:32  07/20/2017 15:18:51 [name], [name] ([id number]) -------------------------------------------------------------------------------- Wound Assessment Details Patient Name: [name], [name] Date of Service: 07/20/2017 1:15 PM Medical Record Number: [id number] Patient Account Number: [id number] Date of Birth/Sex: 07-04-1940 (77 y.o. M) Treating RN: Montey Hora Primary Care Ellenora Talton: Deland Pretty Other Clinician: Referring Gwendolen Hewlett: Deland Pretty Treating Milicent Acheampong/Extender: Melburn Hake, HOYT Weeks in Treatment: 2 Wound Status Wound Number: 1 Primary Etiology: Venous Leg Ulcer Wound  Location: Right, Anterior Lower Leg Wound Status: Open Wounding Event: Blister Date Acquired: 06/29/2017 Weeks Of Treatment: 1 Clustered Wound: No Photos Photo Uploaded By: Montey Hora on 07/20/2017 13:29:00 Wound Measurements Length: (cm) 0.1 Width: (cm) 0.1 Depth: (cm) 0.1 Area: (cm) 0.008 Volume: (cm) 0.001 % Reduction in Area: 99.9% % Reduction in Volume: 99.9% Wound Description Classification: Partial Thickness Periwound Skin Texture Texture Color No Abnormalities Noted: No No Abnormalities Noted: No Moisture No Abnormalities Noted: No Treatment Notes Wound #1 (Right, Anterior Lower Leg) 1. Cleansed with: Clean wound with Normal Saline 5. Secondary Dressing Applied ABD Pad 7. Secured with LAURIER, JASPERSON ([id number]) 3 Layer Compression System - Right Lower Extremity Electronic Signature(s) Signed: 07/20/2017 4:46:08 PM By: Montey Hora Entered By: Montey Hora on 07/20/2017 13:24:55 Pownall, [name] ([id number]) -------------------------------------------------------------------------------- Vitals Details Patient Name: [name] Date of Service: 07/20/2017 1:15 PM Medical Record Number: [id number] Patient Account Number: [id number] Date of Birth/Sex: 1940/07/06 (77 y.o. M) Treating RN: Montey Hora Primary Care Yaqueline Gutter: Deland Pretty Other Clinician: Referring Amarii Amy: Deland Pretty Treating Ernestene Coover/Extender: Melburn Hake, HOYT Weeks in Treatment: 2 Vital Signs Time Taken: 13:24 Temperature (F): 98.4 Height (in): 70 Pulse (bpm): 96 Weight (lbs): 258 Respiratory Rate (breaths/min): 16 Body Mass Index (BMI): 37 Blood Pressure (mmHg): 118/56 Reference Range: 80 - 120 mg / dl Electronic Signature(s) Signed: 07/20/2017 4:46:08 PM By: Montey Hora Entered By: Montey Hora on 07/20/2017 13:24:32

## 2017-07-23 NOTE — Progress Notes (Addendum)
Home Healthcare) MEDICAL NECESSITY: I certify, that based on my findings, NURSING services are a medically necessary home health service. HOME BOUND STATUS: I certify that my clinical findings support that this patient is homebound (i.e., Due to illness or injury, pt requires aid of supportive devices such as crutches, cane, wheelchairs, walkers, the use of special transportation or the assistance of another person to leave their place of residence. There is a normal inability to leave the home and doing so requires considerable and taxing effort. Other absences are for medical reasons / religious services and are infrequent or of short duration when for other reasons). If current dressing causes regression in wound condition, may D/C ordered dressing product/s and apply Normal Saline Moist Dressing daily until next [place] / Other MD appointment. Lyford of regression in wound condition at [phone]. Please direct any NON-WOUND related issues/requests for orders to patient's Primary Care Physician TYLIEK, TIMBERMAN. ([id number]) At this point I'm going  to suggest that we initiate a three layer compression wrap. No sharp debridement was required I'm just gonna recommend an ABD pad overlying the wound bed itself in order to catch any drainage but I do not believe any further dressings are necessary at this point. I do think that the compression wrap which can be changed by home health as well will be beneficial for the patient ongoing. Therefore I'm gonna also recommend ordering a Juxta-Lite compression today. Patient is in agreement the plan. We are going to see were things stand in one weeks time when we see him for reevaluation. Please see above for specific wound care orders. We will see patient for re-evaluation in 1 week(s) here in the clinic. If anything worsens or changes patient will contact our office for additional recommendations. Electronic Signature(s) Signed: 07/21/2017 12:16:15 AM By: Worthy Keeler PA-C Entered By: Worthy Keeler on 07/20/2017 23:48:26 Adelsberger, [name] ([id number]) -------------------------------------------------------------------------------- ROS/PFSH Details Patient Name: [name] Date of Service: 07/20/2017 1:15 PM Medical Record Number: [id number] Patient Account Number: [id number] Date of Birth/Sex: 03-16-1941 (76 y.o. M) Treating RN: Roger Shelter Primary Care Provider: Deland Pretty Other Clinician: Referring Provider: Deland Pretty Treating Provider/Extender: Melburn Hake, Alleya Demeter Weeks in Treatment: 2 Information Obtained From Patient Wound History Do you currently have one or more open woundso No Have you tested positive for osteomyelitis (bone infection)o No Have you had any tests for circulation on your legso No Have you had other problems associated with your woundso Swelling Constitutional Symptoms (General Health) Complaints and Symptoms: Negative for: Fever; Chills Cardiovascular Complaints and Symptoms: Positive for: LE edema Medical History: Positive for: Coronary Artery  Disease; Hypertension Past Medical History Notes: hyperlipidemia Eyes Medical History: Negative for: Cataracts; Glaucoma; Optic Neuritis Ear/Nose/Mouth/Throat Medical History: Negative for: Chronic sinus problems/congestion; Middle ear problems Respiratory Complaints and Symptoms: No Complaints or Symptoms Medical History: Positive for: Chronic Obstructive Pulmonary Disease (COPD); Sleep Apnea - CPAP Gastrointestinal Medical History: Negative for: Cirrhosis ; Colitis; Crohnos; Hepatitis A; Hepatitis B; Hepatitis C Past Medical History Notes: barrett's esophagus Endocrine JOSSIAH, SMOAK. ([id number]) Medical History: Negative for: Type I Diabetes; Type II Diabetes Musculoskeletal Medical History: Positive for: Osteoarthritis Neurologic Medical History: Negative for: Dementia; Neuropathy Oncologic Medical History: Positive for: Received Radiation Negative for: Received Chemotherapy Past Medical History Notes: radiation as a child on forehead Psychiatric Complaints and Symptoms: No Complaints or Symptoms Immunizations Pneumococcal Vaccine: Received Pneumococcal Vaccination: Yes Immunization Notes: up to date Implantable Devices Family and Social History Cancer:  GAYLE, COLLARD ([id number]) Visit Report for 07/20/2017 Chief Complaint Document Details Patient Name: TAISHAWN, SMALDONE Date of Service: 07/20/2017 1:15 PM Medical Record Number: [id number] Patient Account Number: [id number] Date of Birth/Sex: [birth year] (76 y.o. M) Treating RN: Roger Shelter Primary Care Provider: Deland Pretty Other Clinician: Referring Provider: Deland Pretty Treating Provider/Extender: Melburn Hake, Joanie Duprey Weeks in Treatment: 2 Information Obtained from: Patient Chief Complaint He is here for evaluation of RLE blisters Electronic Signature(s) Signed: 07/21/2017 12:16:15 AM By: Worthy Keeler PA-C Entered By: Worthy Keeler on 07/20/2017 23:45:37 Roskos, [name] ([id number]) -------------------------------------------------------------------------------- HPI Details Patient Name: [name] Date of Service: 07/20/2017 1:15 PM Medical Record Number: [id number] Patient Account Number: [id number] Date of Birth/Sex: 1940-06-06 (76 y.o. M) Treating RN: Roger Shelter Primary Care Provider: Deland Pretty Other Clinician: Referring Provider: Deland Pretty Treating Provider/Extender: Melburn Hake, Paris Hohn Weeks in Treatment: 2 History of Present Illness Location: RLE Quality: no pain Duration: +/-1week Context: secondary to edema HPI Description: 07/02/17-he is here for initial dilation of multiple blisters of the right lower extremity. He is status post right [facility] 2/25 for arthritis. He stated postoperatively he did have thigh-high TED hose on bilaterally until discharged. He has had progressive swelling to his bilateral lower extremities, right more so than left. He has developed multiple blisters to the anterior aspect of his right lower extremity. He was evaluated on Monday at the emergency department in [facility]. DVT study was negative, he was discharged on Ace wrap compression and a three-day increase in his Lasix. He has been applying daily compression therapy  since. His echo in 09/2016 with EF 50-55%. Large anterior bullae was drained of serous fluid, no concern for infection. Will treat with bilateral lower extremity compression wrap; anticipate this to be significantly improved at next week's appointment and can then be measured for long-term compression therapy. 07/09/17 on evaluation today patient's right lower extremity ulcer appears to show evidence of doing a little bit worse at this point. Unfortunately he seems to have some cellulitis surrounding the area of blistering on the anterior surface of the right lower extremity. Obviously this is not good especially considering that he is actually set to have his hip replacement surgery tomorrow morning. I do think that we need to get in touch with his physician. His orthopedic surgeon is Dr. Rex Kras at [facility]. 07/20/17 on evaluation today patient presents status post having had his right hip replacement performed last week. The good news is we did get him on the antibiotic regimen that Dr. Rex Kras had recommended which was adding the doxycycline to the Septra. Nonetheless the patient's leg seems to be doing much better the cellulitis also seems to have mostly resolved. The wound in fact is even almost closed although it still does have a slight opening. Nonetheless overall I feel like this is progressing very nicely. The biggest issue I see today is that the patient has significant lower extremity edema. Electronic Signature(s) Signed: 07/21/2017 12:16:15 AM By: Worthy Keeler PA-C Entered By: Worthy Keeler on 07/20/2017 23:45:51 Kinkade, [name] ([id number]) -------------------------------------------------------------------------------- Physical Exam Details Patient Name: [name] Date of Service: 07/20/2017 1:15 PM Medical Record Number: [id number] Patient Account Number: [id number] Date of Birth/Sex: 09-Nov-1940 (76 y.o. M) Treating RN: Roger Shelter Primary Care  Provider: Deland Pretty Other Clinician: Referring Provider: Deland Pretty Treating Provider/Extender: Melburn Hake, Atzel Mccambridge Weeks in Treatment: 2 Constitutional Obese and well-hydrated in no acute distress. Respiratory normal breathing without difficulty. clear to auscultation bilaterally.  Home Healthcare) MEDICAL NECESSITY: I certify, that based on my findings, NURSING services are a medically necessary home health service. HOME BOUND STATUS: I certify that my clinical findings support that this patient is homebound (i.e., Due to illness or injury, pt requires aid of supportive devices such as crutches, cane, wheelchairs, walkers, the use of special transportation or the assistance of another person to leave their place of residence. There is a normal inability to leave the home and doing so requires considerable and taxing effort. Other absences are for medical reasons / religious services and are infrequent or of short duration when for other reasons). If current dressing causes regression in wound condition, may D/C ordered dressing product/s and apply Normal Saline Moist Dressing daily until next [place] / Other MD appointment. Lyford of regression in wound condition at [phone]. Please direct any NON-WOUND related issues/requests for orders to patient's Primary Care Physician TYLIEK, TIMBERMAN. ([id number]) At this point I'm going  to suggest that we initiate a three layer compression wrap. No sharp debridement was required I'm just gonna recommend an ABD pad overlying the wound bed itself in order to catch any drainage but I do not believe any further dressings are necessary at this point. I do think that the compression wrap which can be changed by home health as well will be beneficial for the patient ongoing. Therefore I'm gonna also recommend ordering a Juxta-Lite compression today. Patient is in agreement the plan. We are going to see were things stand in one weeks time when we see him for reevaluation. Please see above for specific wound care orders. We will see patient for re-evaluation in 1 week(s) here in the clinic. If anything worsens or changes patient will contact our office for additional recommendations. Electronic Signature(s) Signed: 07/21/2017 12:16:15 AM By: Worthy Keeler PA-C Entered By: Worthy Keeler on 07/20/2017 23:48:26 Adelsberger, [name] ([id number]) -------------------------------------------------------------------------------- ROS/PFSH Details Patient Name: [name] Date of Service: 07/20/2017 1:15 PM Medical Record Number: [id number] Patient Account Number: [id number] Date of Birth/Sex: 03-16-1941 (76 y.o. M) Treating RN: Roger Shelter Primary Care Provider: Deland Pretty Other Clinician: Referring Provider: Deland Pretty Treating Provider/Extender: Melburn Hake, Alleya Demeter Weeks in Treatment: 2 Information Obtained From Patient Wound History Do you currently have one or more open woundso No Have you tested positive for osteomyelitis (bone infection)o No Have you had any tests for circulation on your legso No Have you had other problems associated with your woundso Swelling Constitutional Symptoms (General Health) Complaints and Symptoms: Negative for: Fever; Chills Cardiovascular Complaints and Symptoms: Positive for: LE edema Medical History: Positive for: Coronary Artery  Disease; Hypertension Past Medical History Notes: hyperlipidemia Eyes Medical History: Negative for: Cataracts; Glaucoma; Optic Neuritis Ear/Nose/Mouth/Throat Medical History: Negative for: Chronic sinus problems/congestion; Middle ear problems Respiratory Complaints and Symptoms: No Complaints or Symptoms Medical History: Positive for: Chronic Obstructive Pulmonary Disease (COPD); Sleep Apnea - CPAP Gastrointestinal Medical History: Negative for: Cirrhosis ; Colitis; Crohnos; Hepatitis A; Hepatitis B; Hepatitis C Past Medical History Notes: barrett's esophagus Endocrine JOSSIAH, SMOAK. ([id number]) Medical History: Negative for: Type I Diabetes; Type II Diabetes Musculoskeletal Medical History: Positive for: Osteoarthritis Neurologic Medical History: Negative for: Dementia; Neuropathy Oncologic Medical History: Positive for: Received Radiation Negative for: Received Chemotherapy Past Medical History Notes: radiation as a child on forehead Psychiatric Complaints and Symptoms: No Complaints or Symptoms Immunizations Pneumococcal Vaccine: Received Pneumococcal Vaccination: Yes Immunization Notes: up to date Implantable Devices Family and Social History Cancer:  GAYLE, COLLARD ([id number]) Visit Report for 07/20/2017 Chief Complaint Document Details Patient Name: TAISHAWN, SMALDONE Date of Service: 07/20/2017 1:15 PM Medical Record Number: [id number] Patient Account Number: [id number] Date of Birth/Sex: [birth year] (76 y.o. M) Treating RN: Roger Shelter Primary Care Provider: Deland Pretty Other Clinician: Referring Provider: Deland Pretty Treating Provider/Extender: Melburn Hake, Joanie Duprey Weeks in Treatment: 2 Information Obtained from: Patient Chief Complaint He is here for evaluation of RLE blisters Electronic Signature(s) Signed: 07/21/2017 12:16:15 AM By: Worthy Keeler PA-C Entered By: Worthy Keeler on 07/20/2017 23:45:37 Roskos, [name] ([id number]) -------------------------------------------------------------------------------- HPI Details Patient Name: [name] Date of Service: 07/20/2017 1:15 PM Medical Record Number: [id number] Patient Account Number: [id number] Date of Birth/Sex: 1940-06-06 (76 y.o. M) Treating RN: Roger Shelter Primary Care Provider: Deland Pretty Other Clinician: Referring Provider: Deland Pretty Treating Provider/Extender: Melburn Hake, Paris Hohn Weeks in Treatment: 2 History of Present Illness Location: RLE Quality: no pain Duration: +/-1week Context: secondary to edema HPI Description: 07/02/17-he is here for initial dilation of multiple blisters of the right lower extremity. He is status post right [facility] 2/25 for arthritis. He stated postoperatively he did have thigh-high TED hose on bilaterally until discharged. He has had progressive swelling to his bilateral lower extremities, right more so than left. He has developed multiple blisters to the anterior aspect of his right lower extremity. He was evaluated on Monday at the emergency department in [facility]. DVT study was negative, he was discharged on Ace wrap compression and a three-day increase in his Lasix. He has been applying daily compression therapy  since. His echo in 09/2016 with EF 50-55%. Large anterior bullae was drained of serous fluid, no concern for infection. Will treat with bilateral lower extremity compression wrap; anticipate this to be significantly improved at next week's appointment and can then be measured for long-term compression therapy. 07/09/17 on evaluation today patient's right lower extremity ulcer appears to show evidence of doing a little bit worse at this point. Unfortunately he seems to have some cellulitis surrounding the area of blistering on the anterior surface of the right lower extremity. Obviously this is not good especially considering that he is actually set to have his hip replacement surgery tomorrow morning. I do think that we need to get in touch with his physician. His orthopedic surgeon is Dr. Rex Kras at [facility]. 07/20/17 on evaluation today patient presents status post having had his right hip replacement performed last week. The good news is we did get him on the antibiotic regimen that Dr. Rex Kras had recommended which was adding the doxycycline to the Septra. Nonetheless the patient's leg seems to be doing much better the cellulitis also seems to have mostly resolved. The wound in fact is even almost closed although it still does have a slight opening. Nonetheless overall I feel like this is progressing very nicely. The biggest issue I see today is that the patient has significant lower extremity edema. Electronic Signature(s) Signed: 07/21/2017 12:16:15 AM By: Worthy Keeler PA-C Entered By: Worthy Keeler on 07/20/2017 23:45:51 Kinkade, [name] ([id number]) -------------------------------------------------------------------------------- Physical Exam Details Patient Name: [name] Date of Service: 07/20/2017 1:15 PM Medical Record Number: [id number] Patient Account Number: [id number] Date of Birth/Sex: 09-Nov-1940 (76 y.o. M) Treating RN: Roger Shelter Primary Care  Provider: Deland Pretty Other Clinician: Referring Provider: Deland Pretty Treating Provider/Extender: Melburn Hake, Atzel Mccambridge Weeks in Treatment: 2 Constitutional Obese and well-hydrated in no acute distress. Respiratory normal breathing without difficulty. clear to auscultation bilaterally.  lower extremities bilateral. Measurements are as follows: left - ankle- 27cm, calf at 44 cm and heel to back of knee at 48cm right- ankle- 28 cm, calf at 45.5 cm and heel to back of knee at 48cm. Electronic Signature(s) Signed: 07/22/2017 4:10:38 PM By: Roger Shelter Signed: 07/24/2017 6:18:22 PM By: Worthy Keeler PA-C Previous Signature: 07/20/2017 4:23:05 PM Version By: Roger Shelter Previous Signature: 07/21/2017 12:16:15 AM Version By: Worthy Keeler PA-C Entered By: Roger Shelter on 07/22/2017 13:28:29 FAYETTE, HAMADA ([id number]) -------------------------------------------------------------------------------- Problem List Details Patient Name: [name] Date of Service: 07/20/2017 1:15 PM Medical Record Number: [id number] Patient Account Number: [id number] Date of Birth/Sex: 1940/07/26 (76 y.o. M) Treating RN: Roger Shelter Primary Care Provider: Deland Pretty Other Clinician: Referring Provider: Deland Pretty Treating Provider/Extender: Melburn Hake, Tammala Weider Weeks in Treatment: 2 Active Problems ICD-10 Impacting Encounter Code Description Active Date Wound Healing Diagnosis R60.9 Edema, unspecified 07/02/2017 Yes S80.821S Blister (nonthermal), right lower leg, sequela 07/02/2017 Yes L03.115 Cellulitis of right lower limb 07/09/2017 Yes Inactive Problems Resolved Problems Electronic Signature(s) Signed: 07/21/2017 12:16:15 AM By: Worthy Keeler PA-C Entered By: Worthy Keeler on 07/20/2017 23:45:31 Dowlen, [name]  ([id number]) -------------------------------------------------------------------------------- Progress Note Details Patient Name: [name] Date of Service: 07/20/2017 1:15 PM Medical Record Number: [id number] Patient Account Number: [id number] Date of Birth/Sex: 11-22-1940 (76 y.o. M) Treating RN: Roger Shelter Primary Care Provider: Deland Pretty Other Clinician: Referring Provider: Deland Pretty Treating Provider/Extender: Melburn Hake, Lanae Federer Weeks in Treatment: 2 Subjective Chief Complaint Information obtained from Patient He is here for evaluation of RLE blisters History of Present Illness (HPI) The following HPI elements were documented for the patient's wound: Location: RLE Quality: no pain Duration: +/-1week Context: secondary to edema 07/02/17-he is here for initial dilation of multiple blisters of the right lower extremity. He is status post right [facility] 2/25 for arthritis. He stated postoperatively he did have thigh-high TED hose on bilaterally until discharged. He has had progressive swelling to his bilateral lower extremities, right more so than left. He has developed multiple blisters to the anterior aspect of his right lower extremity. He was evaluated on Monday at the emergency department in [facility]. DVT study was negative, he was discharged on Ace wrap compression and a three-day increase in his Lasix. He has been applying daily compression therapy since. His echo in 09/2016 with EF 50-55%. Large anterior bullae was drained of serous fluid, no concern for infection. Will treat with bilateral lower extremity compression wrap; anticipate this to be significantly improved at next week's appointment and can then be measured for long-term compression therapy. 07/09/17 on evaluation today patient's right lower extremity ulcer appears to show evidence of doing a little bit worse at this point. Unfortunately he seems to have some cellulitis surrounding the area of blistering on  the anterior surface of the right lower extremity. Obviously this is not good especially considering that he is actually set to have his hip replacement surgery tomorrow morning. I do think that we need to get in touch with his physician. His orthopedic surgeon is Dr. Rex Kras at [place]. 07/20/17 on evaluation today patient presents status post having had his right hip replacement performed last week. The good news is we did get him on the antibiotic regimen that Dr. Rex Kras had recommended which was adding the doxycycline to the Septra. Nonetheless the patient's leg seems to be doing much better the cellulitis also seems to have mostly resolved. The wound in fact is even  Home Healthcare) MEDICAL NECESSITY: I certify, that based on my findings, NURSING services are a medically necessary home health service. HOME BOUND STATUS: I certify that my clinical findings support that this patient is homebound (i.e., Due to illness or injury, pt requires aid of supportive devices such as crutches, cane, wheelchairs, walkers, the use of special transportation or the assistance of another person to leave their place of residence. There is a normal inability to leave the home and doing so requires considerable and taxing effort. Other absences are for medical reasons / religious services and are infrequent or of short duration when for other reasons). If current dressing causes regression in wound condition, may D/C ordered dressing product/s and apply Normal Saline Moist Dressing daily until next [place] / Other MD appointment. Lyford of regression in wound condition at [phone]. Please direct any NON-WOUND related issues/requests for orders to patient's Primary Care Physician TYLIEK, TIMBERMAN. ([phone]) At this point I'm going  to suggest that we initiate a three layer compression wrap. No sharp debridement was required I'm just gonna recommend an ABD pad overlying the wound bed itself in order to catch any drainage but I do not believe any further dressings are necessary at this point. I do think that the compression wrap which can be changed by home health as well will be beneficial for the patient ongoing. Therefore I'm gonna also recommend ordering a Juxta-Lite compression today. Patient is in agreement the plan. We are going to see were things stand in one weeks time when we see him for reevaluation. Please see above for specific wound care orders. We will see patient for re-evaluation in 1 week(s) here in the clinic. If anything worsens or changes patient will contact our office for additional recommendations. Electronic Signature(s) Signed: 07/21/2017 12:16:15 AM By: Worthy Keeler PA-C Entered By: Worthy Keeler on 07/20/2017 23:48:26 Adelsberger, [name] ([id number]) -------------------------------------------------------------------------------- ROS/PFSH Details Patient Name: [name] Date of Service: 07/20/2017 1:15 PM Medical Record Number: [id number] Patient Account Number: [id number] Date of Birth/Sex: 03-16-1941 (76 y.o. M) Treating RN: Roger Shelter Primary Care Provider: Deland Pretty Other Clinician: Referring Provider: Deland Pretty Treating Provider/Extender: Melburn Hake, Alleya Demeter Weeks in Treatment: 2 Information Obtained From Patient Wound History Do you currently have one or more open woundso No Have you tested positive for osteomyelitis (bone infection)o No Have you had any tests for circulation on your legso No Have you had other problems associated with your woundso Swelling Constitutional Symptoms (General Health) Complaints and Symptoms: Negative for: Fever; Chills Cardiovascular Complaints and Symptoms: Positive for: LE edema Medical History: Positive for: Coronary Artery  Disease; Hypertension Past Medical History Notes: hyperlipidemia Eyes Medical History: Negative for: Cataracts; Glaucoma; Optic Neuritis Ear/Nose/Mouth/Throat Medical History: Negative for: Chronic sinus problems/congestion; Middle ear problems Respiratory Complaints and Symptoms: No Complaints or Symptoms Medical History: Positive for: Chronic Obstructive Pulmonary Disease (COPD); Sleep Apnea - CPAP Gastrointestinal Medical History: Negative for: Cirrhosis ; Colitis; Crohnos; Hepatitis A; Hepatitis B; Hepatitis C Past Medical History Notes: barrett's esophagus Endocrine JOSSIAH, SMOAK. ([id number]) Medical History: Negative for: Type I Diabetes; Type II Diabetes Musculoskeletal Medical History: Positive for: Osteoarthritis Neurologic Medical History: Negative for: Dementia; Neuropathy Oncologic Medical History: Positive for: Received Radiation Negative for: Received Chemotherapy Past Medical History Notes: radiation as a child on forehead Psychiatric Complaints and Symptoms: No Complaints or Symptoms Immunizations Pneumococcal Vaccine: Received Pneumococcal Vaccination: Yes Immunization Notes: up to date Implantable Devices Family and Social History Cancer:

## 2017-07-27 ENCOUNTER — Ambulatory Visit: Payer: Medicare PPO | Admitting: Physician Assistant

## 2017-07-28 ENCOUNTER — Encounter: Payer: Medicare PPO | Attending: Physician Assistant | Admitting: Physician Assistant

## 2017-07-28 DIAGNOSIS — R609 Edema, unspecified: Secondary | ICD-10-CM | POA: Insufficient documentation

## 2017-07-28 DIAGNOSIS — Z96641 Presence of right artificial hip joint: Secondary | ICD-10-CM | POA: Diagnosis not present

## 2017-07-28 DIAGNOSIS — Z923 Personal history of irradiation: Secondary | ICD-10-CM | POA: Insufficient documentation

## 2017-07-28 DIAGNOSIS — Z6837 Body mass index (BMI) 37.0-37.9, adult: Secondary | ICD-10-CM | POA: Insufficient documentation

## 2017-07-28 DIAGNOSIS — Z87891 Personal history of nicotine dependence: Secondary | ICD-10-CM | POA: Insufficient documentation

## 2017-07-28 DIAGNOSIS — E669 Obesity, unspecified: Secondary | ICD-10-CM | POA: Diagnosis not present

## 2017-07-28 DIAGNOSIS — L03115 Cellulitis of right lower limb: Secondary | ICD-10-CM | POA: Diagnosis present

## 2017-07-28 DIAGNOSIS — S80821A Blister (nonthermal), right lower leg, initial encounter: Secondary | ICD-10-CM | POA: Diagnosis not present

## 2017-07-28 DIAGNOSIS — E785 Hyperlipidemia, unspecified: Secondary | ICD-10-CM | POA: Diagnosis not present

## 2017-07-28 DIAGNOSIS — K2270 Barrett's esophagus without dysplasia: Secondary | ICD-10-CM | POA: Diagnosis not present

## 2017-07-28 DIAGNOSIS — L539 Erythematous condition, unspecified: Secondary | ICD-10-CM | POA: Diagnosis not present

## 2017-07-31 NOTE — Progress Notes (Signed)
DESHANE, CORKER ([id number]) Visit Report for 07/28/2017 Arrival Information Details Patient Name: [name], [name] Date of Service: 07/28/2017 11:15 AM Medical Record Number: [id number] Patient Account Number: [id number] Date of Birth/Sex: [birth year] (77 y.o. M) Treating RN: Curtis Sites Primary Care Ryley Teater: Merri Brunette Other Clinician: Referring Marlaina Coburn: Merri Brunette Treating Arley Salamone/Extender: Linwood Dibbles, HOYT Weeks in Treatment: 3 Visit Information History Since Last Visit Added or deleted any medications: No Patient Arrived: Walker Any new allergies or adverse reactions: No Arrival Time: 11:13 Had a fall or experienced change in No Accompanied By: spouse activities of daily living that may affect Transfer Assistance: None risk of falls: Patient Identification Verified: Yes Signs or symptoms of abuse/neglect since last visito No Secondary Verification Process Yes Hospitalized since last visit: No Completed: Implantable device outside of the clinic excluding No Patient Has Alerts: Yes cellular tissue based products placed in the center Patient Alerts: Patient on Blood since last visit: Thinner Has Dressing in Place as Prescribed: Yes 2 aspirin 81 mg Has Compression in Place as Prescribed: No Pain Present Now: No Electronic Signature(s) Signed: 07/28/2017 3:48:45 PM By: Curtis Sites Entered By: Curtis Sites on 07/28/2017 11:14:36 Santo, Hulen Skains ([id number]) -------------------------------------------------------------------------------- Clinic Level of Care Assessment Details Patient Name: [name] Date of Service: 07/28/2017 11:15 AM Medical Record Number: [id number] Patient Account Number: [id number] Date of Birth/Sex: [birth year] (77 y.o. M) Treating RN: Phillis Haggis Primary Care Ercel Pepitone: Merri Brunette Other Clinician: Referring Angelie Kram: Merri Brunette Treating Rhyland Hinderliter/Extender: Linwood Dibbles, HOYT Weeks in Treatment: 3 Clinic Level of Care  Assessment Items TOOL 4 Quantity Score X - Use when only an EandM is performed on FOLLOW-UP visit 1 0 ASSESSMENTS - Nursing Assessment / Reassessment X - Reassessment of Co-morbidities (includes updates in patient status) 1 10 X- 1 5 Reassessment of Adherence to Treatment Plan ASSESSMENTS - Wound and Skin Assessment / Reassessment X - Simple Wound Assessment / Reassessment - one wound 1 5 []  - 0 Complex Wound Assessment / Reassessment - multiple wounds []  - 0 Dermatologic / Skin Assessment (not related to wound area) ASSESSMENTS - Focused Assessment []  - Circumferential Edema Measurements - multi extremities 0 []  - 0 Nutritional Assessment / Counseling / Intervention []  - 0 Lower Extremity Assessment (monofilament, tuning fork, pulses) []  - 0 Peripheral Arterial Disease Assessment (using hand held doppler) ASSESSMENTS - Ostomy and/or Continence Assessment and Care []  - Incontinence Assessment and Management 0 []  - 0 Ostomy Care Assessment and Management (repouching, etc.) PROCESS - Coordination of Care X - Simple Patient / Family Education for ongoing care 1 15 []  - 0 Complex (extensive) Patient / Family Education for ongoing care []  - 0 Staff obtains Chiropractor, Records, Test Results / Process Orders []  - 0 Staff telephones HHA, Nursing Homes / Clarify orders / etc []  - 0 Routine Transfer to another Facility (non-emergent condition) []  - 0 Routine Hospital Admission (non-emergent condition) []  - 0 New Admissions / Manufacturing engineer / Ordering NPWT, Apligraf, etc. []  - 0 Emergency Hospital Admission (emergent condition) X- 1 10 Simple Discharge Coordination KELLIE, ATHAN. ([id number]) []  - 0 Complex (extensive) Discharge Coordination PROCESS - Special Needs []  - Pediatric / Minor Patient Management 0 []  - 0 Isolation Patient Management []  - 0 Hearing / Language / Visual special needs []  - 0 Assessment of Community assistance (transportation, D/C planning,  etc.) []  - 0 Additional assistance / Altered mentation []  - 0 Support Surface(s) Assessment (bed, cushion, seat, etc.) INTERVENTIONS - Wound Cleansing / Measurement X -  Simple Wound Cleansing - one wound 1 5 []  - 0 Complex Wound Cleansing - multiple wounds X- 1 5 Wound Imaging (photographs - any number of wounds) []  - 0 Wound Tracing (instead of photographs) []  - 0 Simple Wound Measurement - one wound []  - 0 Complex Wound Measurement - multiple wounds INTERVENTIONS - Wound Dressings []  - Small Wound Dressing one or multiple wounds 0 []  - 0 Medium Wound Dressing one or multiple wounds []  - 0 Large Wound Dressing one or multiple wounds []  - 0 Application of Medications - topical []  - 0 Application of Medications - injection INTERVENTIONS - Miscellaneous []  - External ear exam 0 []  - 0 Specimen Collection (cultures, biopsies, blood, body fluids, etc.) []  - 0 Specimen(s) / Culture(s) sent or taken to Lab for analysis []  - 0 Patient Transfer (multiple staff / Nurse, adult / Similar devices) []  - 0 Simple Staple / Suture removal (25 or less) []  - 0 Complex Staple / Suture removal (26 or more) []  - 0 Hypo / Hyperglycemic Management (close monitor of Blood Glucose) []  - 0 Ankle / Brachial Index (ABI) - do not check if billed separately X- 1 5 Vital Signs Silverthorn, Jheremy W. ([id number]) Has the patient been seen at the hospital within the last three years: Yes Total Score: 60 Level Of Care: New/Established - Level 2 Electronic Signature(s) Signed: 07/30/2017 4:32:30 PM By: Alejandro Mulling Entered By: Alejandro Mulling on 07/28/2017 12:55:40 Buren, Hulen Skains ([id number]) -------------------------------------------------------------------------------- Encounter Discharge Information Details Patient Name: [name] Date of Service: 07/28/2017 11:15 AM Medical Record Number: [id number] Patient Account Number: [id number] Date of Birth/Sex: 1940-12-27 (77 y.o. M) Treating RN:  Phillis Haggis Primary Care Ander Wamser: Merri Brunette Other Clinician: Referring Sreshta Cressler: Merri Brunette Treating Junior Kenedy/Extender: Linwood Dibbles, HOYT Weeks in Treatment: 3 Encounter Discharge Information Items Discharge Pain Level: 0 Discharge Condition: Stable Ambulatory Status: Walker Discharge Destination: Home Transportation: Private Auto Accompanied By: wife Schedule Follow-up Appointment: No Medication Reconciliation completed and No provided to Patient/Care Shan Padgett: Patient Clinical Summary of Care: Declined Electronic Signature(s) Signed: 07/29/2017 3:25:45 PM By: Gwenlyn Perking Entered By: Gwenlyn Perking on 07/28/2017 11:38:24 Carton, Hulen Skains ([id number]) -------------------------------------------------------------------------------- Lower Extremity Assessment Details Patient Name: [name] Date of Service: 07/28/2017 11:15 AM Medical Record Number: [id number] Patient Account Number: [id number] Date of Birth/Sex: 01/31/1941 (77 y.o. M) Treating RN: Curtis Sites Primary Care Lorren Splawn: Merri Brunette Other Clinician: Referring Maximiliano Cromartie: Merri Brunette Treating Serrita Lueth/Extender: Linwood Dibbles, HOYT Weeks in Treatment: 3 Edema Assessment Assessed: [Left: No] [Right: No] [Left: Edema] [Right: :] Calf Left: Right: Point of Measurement: 35 cm From Medial Instep 43.5 cm 45.6 cm Ankle Left: Right: Point of Measurement: 12 cm From Medial Instep 29.3 cm 27.8 cm Vascular Assessment Pulses: Dorsalis Pedis Palpable: [Left:Yes] [Right:Yes] Posterior Tibial Extremity colors, hair growth, and conditions: Extremity Color: [Left:Normal] [Right:Red] Hair Growth on Extremity: [Left:Yes] [Right:Yes] Temperature of Extremity: [Left:Warm] [Right:Warm] Capillary Refill: [Left:< 3 seconds] [Right:< 3 seconds] Electronic Signature(s) Signed: 07/28/2017 3:48:45 PM By: Curtis Sites Entered By: Curtis Sites on 07/28/2017 11:19:58 Molina, Hulen Skains  ([id number]) -------------------------------------------------------------------------------- Multi Wound Chart Details Patient Name: [name] Date of Service: 07/28/2017 11:15 AM Medical Record Number: [id number] Patient Account Number: [id number] Date of Birth/Sex: Feb 14, 1941 (77 y.o. M) Treating RN: Phillis Haggis Primary Care Angeleena Dueitt: Merri Brunette Other Clinician: Referring Yamilka Lopiccolo: Merri Brunette Treating Tesia Lybrand/Extender: Linwood Dibbles, HOYT Weeks in Treatment: 3 Vital Signs Height(in): 70 Pulse(bpm): 92 Weight(lbs): 258 Blood Pressure(mmHg): 129/67 Body Mass Index(BMI): 37 Temperature(F): 98.1 Respiratory Rate  16 (breaths/min): Photos: [1:No Photos] [N/A:N/A] Wound Location: [1:Right, Anterior Lower Leg] [N/A:N/A] Wounding Event: [1:Blister] [N/A:N/A] Primary Etiology: [1:Venous Leg Ulcer] [N/A:N/A] Date Acquired: [1:06/29/2017] [N/A:N/A] Weeks of Treatment: [1:2] [N/A:N/A] Wound Status: [1:Healed - Epithelialized] [N/A:N/A] Measurements L x W x D [1:0x0x0] [N/A:N/A] (cm) Area (cm) : [1:0] [N/A:N/A] Volume (cm) : [1:0] [N/A:N/A] % Reduction in Area: [1:100.00%] [N/A:N/A] % Reduction in Volume: [1:100.00%] [N/A:N/A] Classification: [1:Partial Thickness] [N/A:N/A] Periwound Skin Texture: [1:No Abnormalities Noted] [N/A:N/A] Periwound Skin Moisture: [1:No Abnormalities Noted] [N/A:N/A] Periwound Skin Color: [1:No Abnormalities Noted No] [N/A:N/A N/A] Treatment Notes Electronic Signature(s) Signed: 07/30/2017 4:32:30 PM By: Alejandro Mulling Entered By: Alejandro Mulling on 07/28/2017 11:26:38 HESSHulen Skains ([id number]) -------------------------------------------------------------------------------- Multi-Disciplinary Care Plan Details Patient Name: [name] Date of Service: 07/28/2017 11:15 AM Medical Record Number: [id number] Patient Account Number: [id number] Date of Birth/Sex: 04-03-1941 (77 y.o. M) Treating RN: Phillis Haggis Primary Care  Zeppelin Commisso: Merri Brunette Other Clinician: Referring Julicia Krieger: Merri Brunette Treating Matvey Llanas/Extender: Linwood Dibbles, HOYT Weeks in Treatment: 3 Active Inactive Electronic Signature(s) Signed: 07/30/2017 4:32:30 PM By: Alejandro Mulling Entered By: Alejandro Mulling on 07/28/2017 11:27:30 Dura, Hulen Skains ([id number]) -------------------------------------------------------------------------------- Pain Assessment Details Patient Name: [name] Date of Service: 07/28/2017 11:15 AM Medical Record Number: [id number] Patient Account Number: [id number] Date of Birth/Sex: 1940-12-13 (77 y.o. M) Treating RN: Curtis Sites Primary Care Lynsee Wands: Merri Brunette Other Clinician: Referring Annesha Delgreco: Merri Brunette Treating Sari Cogan/Extender: Linwood Dibbles, HOYT Weeks in Treatment: 3 Active Problems Location of Pain Severity and Description of Pain Patient Has Paino No Site Locations Pain Management and Medication Current Pain Management: Electronic Signature(s) Signed: 07/28/2017 3:48:45 PM By: Curtis Sites Entered By: Curtis Sites on 07/28/2017 11:14:47 Dotts, Hulen Skains ([id number]) -------------------------------------------------------------------------------- Patient/Caregiver Education Details Patient Name: [name] Date of Service: 07/28/2017 11:15 AM Medical Record Number: [id number] Patient Account Number: [id number] Date of Birth/Gender: 07/20/1940 (77 y.o. M) Treating RN: Phillis Haggis Primary Care Physician: Merri Brunette Other Clinician: Referring Physician: Merri Brunette Treating Physician/Extender: Skeet Simmer in Treatment: 3 Education Assessment Education Provided To: Patient Education Topics Provided Wound/Skin Impairment: Handouts: Other: Please purchase compression stockings 20-30 compression. Keep legs elevated when sitting. Methods: Explain/Verbal Responses: State content correctly Electronic Signature(s) Signed: 07/30/2017 4:32:30 PM By: Alejandro Mulling Entered By: Alejandro Mulling on 07/28/2017 11:31:58 Milberger, Hulen Skains ([id number]) -------------------------------------------------------------------------------- Wound Assessment Details Patient Name: [name] Date of Service: 07/28/2017 11:15 AM Medical Record Number: [id number] Patient Account Number: [id number] Date of Birth/Sex: 1940/09/27 (77 y.o. M) Treating RN: Curtis Sites Primary Care Alfie Alderfer: Merri Brunette Other Clinician: Referring Andrian Urbach: Merri Brunette Treating [place] Wanner/Extender: Linwood Dibbles, HOYT Weeks in Treatment: 3 Wound Status Wound Number: 1 Primary Etiology: Venous Leg Ulcer Wound Location: Right, Anterior Lower Leg Wound Status: Healed - Epithelialized Wounding Event: Blister Date Acquired: 06/29/2017 Weeks Of Treatment: 2 Clustered Wound: No Photos Photo Uploaded By: Curtis Sites on 07/28/2017 11:43:27 Wound Measurements Length: (cm) 0 Width: (cm) 0 Depth: (cm) 0 Area: (cm) 0 Volume: (cm) 0 % Reduction in Area: 100% % Reduction in Volume: 100% Wound Description Classification: Partial Thickness Periwound Skin Texture Texture Color No Abnormalities Noted: No No Abnormalities Noted: No Moisture No Abnormalities Noted: No Electronic Signature(s) Signed: 07/28/2017 3:48:45 PM By: Curtis Sites Entered By: Curtis Sites on 07/28/2017 11:17:06 Krolikowski, Hulen Skains ([id number]) -------------------------------------------------------------------------------- Vitals Details Patient Name: [name] Date of Service: 07/28/2017 11:15 AM Medical Record Number: [id number] Patient Account Number: [id number] Date of Birth/Sex: 1940/10/21 (77 y.o. M) Treating RN: Curtis Sites Primary Care  Antonios Ostrow: Merri Brunette Other Clinician: Referring Gaylon Melchor: Merri Brunette Treating Andilyn Bettcher/Extender: Linwood Dibbles, HOYT Weeks in Treatment: 3 Vital Signs Time Taken: 11:18 Temperature (F): 98.1 Height (in): 70 Pulse (bpm): 92 Weight (lbs):  258 Respiratory Rate (breaths/min): 16 Body Mass Index (BMI): 37 Blood Pressure (mmHg): 129/67 Reference Range: 80 - 120 mg / dl Electronic Signature(s) Signed: 07/28/2017 3:48:45 PM By: Curtis Sites Entered By: Curtis Sites on 07/28/2017 11:19:04

## 2017-07-31 NOTE — Progress Notes (Signed)
[name] on 07/28/2017 11:37:34 SOLOMAN, MCKEITHAN ([id number]) -------------------------------------------------------------------------------- Physical Exam Details Patient Name: [name], [name] Date of Service: 07/28/2017 11:15 AM Medical Record Number: [id number] Patient Account Number: [id number] Date of Birth/Sex: July 30, 1940 (76 y.o. M) Treating RN: Ahmed Prima Primary Care Provider: Deland Pretty Other Clinician: Referring Provider: Deland Pretty Treating Provider/Extender: Melburn Hake, Eris Breck Weeks in Treatment: 3 Constitutional Obese and well-hydrated in no acute distress. Respiratory normal breathing without difficulty. Psychiatric this patient is able to make decisions and demonstrates good insight into disease process. Alert and Oriented x 3. pleasant and cooperative. Notes Currently patient has no open ulcerations he does have some erythema but I do not believe this is likely due to infection but rather is more due to the swelling that he has he has definitely more significant swelling of the right lower extremity compared to the left lower extremity. Electronic Signature(s) Signed: 07/28/2017 5:42:34 PM By: James Keeler PA-C Entered By: [name] on 07/28/2017 11:38:33 Quaranta, [name] ([id number]) -------------------------------------------------------------------------------- Physician Orders Details Patient Name: [name] Date of Service: 07/28/2017 11:15 AM Medical Record Number: [id number] Patient Account Number: [id number] Date of Birth/Sex: 1940/07/30 (76 y.o. M) Treating RN: Ahmed Prima Primary Care Provider: Deland Pretty Other Clinician: Referring Provider: Deland Pretty Treating Provider/Extender: Melburn Hake, Ona Rathert Weeks in Treatment: 3 Verbal / Phone Orders: Yes ClinicianCarolyne Fiscal, Debi Read Back and Verified: Yes Diagnosis Coding ICD-10 Coding Code  Description R60.9 Edema, unspecified S80.821S Blister (nonthermal), right lower leg, sequela L03.115 Cellulitis of right lower limb Discharge From [facility] o Discharge from [place] - Please purchase compression stockings 20-30 compression. Keep legs elevated when sitting. Please call our office if you have any questions or concerns. Electronic Signature(s) Signed: 07/28/2017 5:42:34 PM By: James Keeler PA-C Signed: 07/30/2017 4:32:30 PM By: Alric Quan Entered By: Alric Quan on 07/28/2017 11:31:11 Strollo, [name] ([id number]) -------------------------------------------------------------------------------- Problem List Details Patient Name: [name] Date of Service: 07/28/2017 11:15 AM Medical Record Number: [id number] Patient Account Number: [id number] Date of Birth/Sex: [birth year] (76 y.o. M) Treating RN: Ahmed Prima Primary Care Provider: Deland Pretty Other Clinician: Referring Provider: Deland Pretty Treating Provider/Extender: Melburn Hake, Niles Ess Weeks in Treatment: 3 Active Problems ICD-10 Impacting Encounter Code Description Active Date Wound Healing Diagnosis R60.9 Edema, unspecified 07/02/2017 Yes S80.821S Blister (nonthermal), right lower leg, sequela 07/02/2017 Yes L03.115 Cellulitis of right lower limb 07/09/2017 Yes Inactive Problems Resolved Problems Electronic Signature(s) Signed: 07/28/2017 5:42:34 PM By: James Keeler PA-C Entered By: [name] on 07/28/2017 11:24:37 [place]wood, [name] ([id number]) -------------------------------------------------------------------------------- Progress Note Details Patient Name: [name] Date of Service: 07/28/2017 11:15 AM Medical Record Number: [id number] Patient Account Number: [id number] Date of Birth/Sex: 1941-03-10 (76 y.o. M) Treating RN: Ahmed Prima Primary Care Provider: Deland Pretty Other Clinician: Referring Provider: Deland Pretty Treating Provider/Extender: Melburn Hake,  Khristine Verno Weeks in Treatment: 3 Subjective Chief Complaint Information obtained from Patient He is here for evaluation of RLE blisters History of Present Illness (HPI) The following HPI elements were documented for the patient's wound: Location: RLE Quality: no pain Duration: +/-1week Context: secondary to edema 07/02/17-he is here for initial dilation of multiple blisters of the right lower extremity. He is status post right [facility] 2/25 for arthritis. He stated postoperatively he did have thigh-high TED hose on bilaterally until discharged. He has had progressive swelling to his bilateral lower extremities, right more so than left. He has developed multiple blisters to the anterior aspect of his right  Healed - Epithelialized. Original cause of wound was Blister. The wound is located on the Right,Anterior Lower Leg. The wound measures 0cm length x 0cm width x 0cm depth; 0cm^2 area and 0cm^3 volume. ALLYN, BERTONI ([id number]) Assessment Active Problems ICD-10 R60.9 - Edema, unspecified S80.821S - Blister (nonthermal), right lower leg, sequela L03.115 - Cellulitis of right lower limb Plan Discharge From [facility]: Discharge from [place] - Please purchase compression stockings 20-30 compression. Keep legs elevated when sitting. Please call our office if you have any questions or concerns. At this point I'm gonna recommend that we discontinue wound care services as he appears to be healed. He also does not seem to have any significant cellulitis at this point which is good news. Nonetheless he does have your theme I feel like this is more related to lymphedema versus cellulitis. I am going to recommend he go to elastic therapy in [facility] to obtain compression stockings. He is definitely okay with doing that. I explained this will be much more cost-effective than some the other medical supply stores. Otherwise we will see him for reevaluation as  needed in the future if anything changes or worsens. Electronic Signature(s) Signed: 07/28/2017 5:42:34 PM By: James Keeler PA-C Entered By: [name] on 07/28/2017 11:39:27 Pickup, [name] ([id number]) -------------------------------------------------------------------------------- ROS/PFSH Details Patient Name: [name] Date of Service: 07/28/2017 11:15 AM Medical Record Number: [id number] Patient Account Number: [id number] Date of Birth/Sex: 1940-08-12 (76 y.o. M) Treating RN: Ahmed Prima Primary Care Provider: Deland Pretty Other Clinician: Referring Provider: Deland Pretty Treating Provider/Extender: Melburn Hake, Arrayah Connors Weeks in Treatment: 3 Information Obtained From Patient Wound History Do you currently have one or more open woundso No Have you tested positive for osteomyelitis (bone infection)o No Have you had any tests for circulation on your legso No Have you had other problems associated with your woundso Swelling Constitutional Symptoms (General Health) Complaints and Symptoms: Negative for: Fever; Chills Cardiovascular Complaints and Symptoms: Positive for: LE edema Medical History: Positive for: Coronary Artery Disease; Hypertension Past Medical History Notes: hyperlipidemia Eyes Medical History: Negative for: Cataracts; Glaucoma; Optic Neuritis Ear/Nose/Mouth/Throat Medical History: Negative for: Chronic sinus problems/congestion; Middle ear problems Respiratory Complaints and Symptoms: No Complaints or Symptoms Medical History: Positive for: Chronic Obstructive Pulmonary Disease (COPD); Sleep Apnea - CPAP Gastrointestinal Medical History: Negative for: Cirrhosis ; Colitis; Crohnos; Hepatitis A; Hepatitis B; Hepatitis C Past Medical History Notes: barrett's esophagus Endocrine RILLEY, STASH. ([id number]) Medical History: Negative for: Type I Diabetes; Type II Diabetes Musculoskeletal Medical History: Positive for:  Osteoarthritis Neurologic Medical History: Negative for: Dementia; Neuropathy Oncologic Medical History: Positive for: Received Radiation Negative for: Received Chemotherapy Past Medical History Notes: radiation as a child on forehead Psychiatric Complaints and Symptoms: No Complaints or Symptoms Immunizations Pneumococcal Vaccine: Received Pneumococcal Vaccination: Yes Immunization Notes: up to date Implantable Devices Family and Social History Cancer: Yes - Father,Maternal Grandparents; Diabetes: No; Heart Disease: No; Hereditary Spherocytosis: No; Hypertension: No; Kidney Disease: No; Lung Disease: Yes - Mother; Seizures: No; Stroke: Yes - Paternal Grandparents; Thyroid Problems: No; Tuberculosis: No; Former smoker - quit 33 years ago; Marital Status - Married; Alcohol Use: Moderate; Drug Use: No History; Caffeine Use: Daily; Financial Concerns: No; Food, Clothing or Shelter Needs: No; Support System Lacking: No; Transportation Concerns: No; Advanced Directives: No; Patient does not want information on Advanced Directives Physician Affirmation I have reviewed and agree with the above information. Electronic Signature(s) Signed: 07/28/2017 5:42:34 PM By: James Keeler  PA-C Signed: 07/30/2017 4:32:30 PM By: Alric Quan Entered By: [name] on 07/28/2017 11:37:57 Glasco, [name] ([id number]) -------------------------------------------------------------------------------- SuperBill Details Patient Name: [name] Date of Service: 07/28/2017 Medical Record Number: [id number] Patient Account Number: [id number] Date of Birth/Sex: December 04, 1940 (76 y.o. M) Treating RN: Ahmed Prima Primary Care Provider: Deland Pretty Other Clinician: Referring Provider: Deland Pretty Treating Provider/Extender: Melburn Hake, Choice Kleinsasser Weeks in Treatment: 3 Diagnosis Coding ICD-10 Codes Code Description R60.9 Edema, unspecified S80.821S Blister (nonthermal), right lower leg,  sequela L03.115 Cellulitis of right lower limb Facility Procedures CPT4 Code: [id number] Description: [phone] - WOUND CARE VISIT-LEV 2 EST PT Modifier: Quantity: 1 Physician Procedures CPT4 Code: [id number] Description: [id number] - WC PHYS LEVEL 2 - EST PT ICD-10 Diagnosis Description R60.9 Edema, unspecified S80.821S Blister (nonthermal), right lower leg, sequela L03.115 Cellulitis of right lower limb Modifier: Quantity: 1 Electronic Signature(s) Signed: 07/28/2017 12:55:48 PM By: Alric Quan Signed: 07/28/2017 5:42:34 PM By: James Keeler PA-C Entered By: Alric Quan on 07/28/2017 12:55:48  Healed - Epithelialized. Original cause of wound was Blister. The wound is located on the Right,Anterior Lower Leg. The wound measures 0cm length x 0cm width x 0cm depth; 0cm^2 area and 0cm^3 volume. ALLYN, BERTONI ([id number]) Assessment Active Problems ICD-10 R60.9 - Edema, unspecified S80.821S - Blister (nonthermal), right lower leg, sequela L03.115 - Cellulitis of right lower limb Plan Discharge From [facility]: Discharge from [place] - Please purchase compression stockings 20-30 compression. Keep legs elevated when sitting. Please call our office if you have any questions or concerns. At this point I'm gonna recommend that we discontinue wound care services as he appears to be healed. He also does not seem to have any significant cellulitis at this point which is good news. Nonetheless he does have your theme I feel like this is more related to lymphedema versus cellulitis. I am going to recommend he go to elastic therapy in [facility] to obtain compression stockings. He is definitely okay with doing that. I explained this will be much more cost-effective than some the other medical supply stores. Otherwise we will see him for reevaluation as  needed in the future if anything changes or worsens. Electronic Signature(s) Signed: 07/28/2017 5:42:34 PM By: James Keeler PA-C Entered By: [name] on 07/28/2017 11:39:27 Pickup, [name] ([id number]) -------------------------------------------------------------------------------- ROS/PFSH Details Patient Name: [name] Date of Service: 07/28/2017 11:15 AM Medical Record Number: [id number] Patient Account Number: [id number] Date of Birth/Sex: 1940-08-12 (76 y.o. M) Treating RN: Ahmed Prima Primary Care Provider: Deland Pretty Other Clinician: Referring Provider: Deland Pretty Treating Provider/Extender: Melburn Hake, Arrayah Connors Weeks in Treatment: 3 Information Obtained From Patient Wound History Do you currently have one or more open woundso No Have you tested positive for osteomyelitis (bone infection)o No Have you had any tests for circulation on your legso No Have you had other problems associated with your woundso Swelling Constitutional Symptoms (General Health) Complaints and Symptoms: Negative for: Fever; Chills Cardiovascular Complaints and Symptoms: Positive for: LE edema Medical History: Positive for: Coronary Artery Disease; Hypertension Past Medical History Notes: hyperlipidemia Eyes Medical History: Negative for: Cataracts; Glaucoma; Optic Neuritis Ear/Nose/Mouth/Throat Medical History: Negative for: Chronic sinus problems/congestion; Middle ear problems Respiratory Complaints and Symptoms: No Complaints or Symptoms Medical History: Positive for: Chronic Obstructive Pulmonary Disease (COPD); Sleep Apnea - CPAP Gastrointestinal Medical History: Negative for: Cirrhosis ; Colitis; Crohnos; Hepatitis A; Hepatitis B; Hepatitis C Past Medical History Notes: barrett's esophagus Endocrine RILLEY, STASH. ([id number]) Medical History: Negative for: Type I Diabetes; Type II Diabetes Musculoskeletal Medical History: Positive for:  Osteoarthritis Neurologic Medical History: Negative for: Dementia; Neuropathy Oncologic Medical History: Positive for: Received Radiation Negative for: Received Chemotherapy Past Medical History Notes: radiation as a child on forehead Psychiatric Complaints and Symptoms: No Complaints or Symptoms Immunizations Pneumococcal Vaccine: Received Pneumococcal Vaccination: Yes Immunization Notes: up to date Implantable Devices Family and Social History Cancer: Yes - Father,Maternal Grandparents; Diabetes: No; Heart Disease: No; Hereditary Spherocytosis: No; Hypertension: No; Kidney Disease: No; Lung Disease: Yes - Mother; Seizures: No; Stroke: Yes - Paternal Grandparents; Thyroid Problems: No; Tuberculosis: No; Former smoker - quit 33 years ago; Marital Status - Married; Alcohol Use: Moderate; Drug Use: No History; Caffeine Use: Daily; Financial Concerns: No; Food, Clothing or Shelter Needs: No; Support System Lacking: No; Transportation Concerns: No; Advanced Directives: No; Patient does not want information on Advanced Directives Physician Affirmation I have reviewed and agree with the above information. Electronic Signature(s) Signed: 07/28/2017 5:42:34 PM By: [name]  Healed - Epithelialized. Original cause of wound was Blister. The wound is located on the Right,Anterior Lower Leg. The wound measures 0cm length x 0cm width x 0cm depth; 0cm^2 area and 0cm^3 volume. ALLYN, BERTONI ([id number]) Assessment Active Problems ICD-10 R60.9 - Edema, unspecified S80.821S - Blister (nonthermal), right lower leg, sequela L03.115 - Cellulitis of right lower limb Plan Discharge From [facility]: Discharge from [place] - Please purchase compression stockings 20-30 compression. Keep legs elevated when sitting. Please call our office if you have any questions or concerns. At this point I'm gonna recommend that we discontinue wound care services as he appears to be healed. He also does not seem to have any significant cellulitis at this point which is good news. Nonetheless he does have your theme I feel like this is more related to lymphedema versus cellulitis. I am going to recommend he go to elastic therapy in [facility] to obtain compression stockings. He is definitely okay with doing that. I explained this will be much more cost-effective than some the other medical supply stores. Otherwise we will see him for reevaluation as  needed in the future if anything changes or worsens. Electronic Signature(s) Signed: 07/28/2017 5:42:34 PM By: James Keeler PA-C Entered By: [name] on 07/28/2017 11:39:27 Pickup, [name] ([id number]) -------------------------------------------------------------------------------- ROS/PFSH Details Patient Name: [name] Date of Service: 07/28/2017 11:15 AM Medical Record Number: [id number] Patient Account Number: [id number] Date of Birth/Sex: 1940-08-12 (76 y.o. M) Treating RN: Ahmed Prima Primary Care Provider: Deland Pretty Other Clinician: Referring Provider: Deland Pretty Treating Provider/Extender: Melburn Hake, Arrayah Connors Weeks in Treatment: 3 Information Obtained From Patient Wound History Do you currently have one or more open woundso No Have you tested positive for osteomyelitis (bone infection)o No Have you had any tests for circulation on your legso No Have you had other problems associated with your woundso Swelling Constitutional Symptoms (General Health) Complaints and Symptoms: Negative for: Fever; Chills Cardiovascular Complaints and Symptoms: Positive for: LE edema Medical History: Positive for: Coronary Artery Disease; Hypertension Past Medical History Notes: hyperlipidemia Eyes Medical History: Negative for: Cataracts; Glaucoma; Optic Neuritis Ear/Nose/Mouth/Throat Medical History: Negative for: Chronic sinus problems/congestion; Middle ear problems Respiratory Complaints and Symptoms: No Complaints or Symptoms Medical History: Positive for: Chronic Obstructive Pulmonary Disease (COPD); Sleep Apnea - CPAP Gastrointestinal Medical History: Negative for: Cirrhosis ; Colitis; Crohnos; Hepatitis A; Hepatitis B; Hepatitis C Past Medical History Notes: barrett's esophagus Endocrine RILLEY, STASH. ([id number]) Medical History: Negative for: Type I Diabetes; Type II Diabetes Musculoskeletal Medical History: Positive for:  Osteoarthritis Neurologic Medical History: Negative for: Dementia; Neuropathy Oncologic Medical History: Positive for: Received Radiation Negative for: Received Chemotherapy Past Medical History Notes: radiation as a child on forehead Psychiatric Complaints and Symptoms: No Complaints or Symptoms Immunizations Pneumococcal Vaccine: Received Pneumococcal Vaccination: Yes Immunization Notes: up to date Implantable Devices Family and Social History Cancer: Yes - Father,Maternal Grandparents; Diabetes: No; Heart Disease: No; Hereditary Spherocytosis: No; Hypertension: No; Kidney Disease: No; Lung Disease: Yes - Mother; Seizures: No; Stroke: Yes - Paternal Grandparents; Thyroid Problems: No; Tuberculosis: No; Former smoker - quit 33 years ago; Marital Status - Married; Alcohol Use: Moderate; Drug Use: No History; Caffeine Use: Daily; Financial Concerns: No; Food, Clothing or Shelter Needs: No; Support System Lacking: No; Transportation Concerns: No; Advanced Directives: No; Patient does not want information on Advanced Directives Physician Affirmation I have reviewed and agree with the above information. Electronic Signature(s) Signed: 07/28/2017 5:42:34 PM By: [name]

## 2017-08-05 DIAGNOSIS — I2510 Atherosclerotic heart disease of native coronary artery without angina pectoris: Secondary | ICD-10-CM | POA: Diagnosis not present

## 2017-08-05 DIAGNOSIS — L97919 Non-pressure chronic ulcer of unspecified part of right lower leg with unspecified severity: Secondary | ICD-10-CM | POA: Diagnosis not present

## 2017-08-05 DIAGNOSIS — I83891 Varicose veins of right lower extremities with other complications: Secondary | ICD-10-CM | POA: Diagnosis not present

## 2017-08-05 DIAGNOSIS — I83019 Varicose veins of right lower extremity with ulcer of unspecified site: Secondary | ICD-10-CM | POA: Diagnosis not present

## 2017-08-20 DIAGNOSIS — N5201 Erectile dysfunction due to arterial insufficiency: Secondary | ICD-10-CM | POA: Diagnosis not present

## 2017-08-20 DIAGNOSIS — N4883 Acquired buried penis: Secondary | ICD-10-CM | POA: Diagnosis not present

## 2017-08-25 DIAGNOSIS — Z471 Aftercare following joint replacement surgery: Secondary | ICD-10-CM | POA: Diagnosis not present

## 2017-08-25 DIAGNOSIS — Z96641 Presence of right artificial hip joint: Secondary | ICD-10-CM | POA: Diagnosis not present

## 2017-09-10 DIAGNOSIS — G4733 Obstructive sleep apnea (adult) (pediatric): Secondary | ICD-10-CM | POA: Diagnosis not present

## 2017-09-15 DIAGNOSIS — L820 Inflamed seborrheic keratosis: Secondary | ICD-10-CM | POA: Diagnosis not present

## 2017-09-15 DIAGNOSIS — L400 Psoriasis vulgaris: Secondary | ICD-10-CM | POA: Diagnosis not present

## 2017-09-19 ENCOUNTER — Other Ambulatory Visit: Payer: Self-pay | Admitting: Cardiology

## 2017-09-22 NOTE — Telephone Encounter (Signed)
Rx(s) sent to pharmacy electronically.  

## 2017-10-01 DIAGNOSIS — N5201 Erectile dysfunction due to arterial insufficiency: Secondary | ICD-10-CM | POA: Diagnosis not present

## 2017-10-06 DIAGNOSIS — I10 Essential (primary) hypertension: Secondary | ICD-10-CM | POA: Diagnosis not present

## 2017-10-06 DIAGNOSIS — I872 Venous insufficiency (chronic) (peripheral): Secondary | ICD-10-CM | POA: Diagnosis not present

## 2017-10-06 DIAGNOSIS — R609 Edema, unspecified: Secondary | ICD-10-CM | POA: Diagnosis not present

## 2017-10-06 DIAGNOSIS — Z96641 Presence of right artificial hip joint: Secondary | ICD-10-CM | POA: Diagnosis not present

## 2017-10-12 DIAGNOSIS — M7061 Trochanteric bursitis, right hip: Secondary | ICD-10-CM | POA: Diagnosis not present

## 2017-10-19 DIAGNOSIS — R609 Edema, unspecified: Secondary | ICD-10-CM | POA: Diagnosis not present

## 2017-10-19 DIAGNOSIS — I872 Venous insufficiency (chronic) (peripheral): Secondary | ICD-10-CM | POA: Diagnosis not present

## 2017-10-27 ENCOUNTER — Other Ambulatory Visit: Payer: Self-pay | Admitting: Cardiology

## 2017-11-02 DIAGNOSIS — R609 Edema, unspecified: Secondary | ICD-10-CM | POA: Diagnosis not present

## 2017-11-05 DIAGNOSIS — M6281 Muscle weakness (generalized): Secondary | ICD-10-CM | POA: Diagnosis not present

## 2017-11-05 DIAGNOSIS — R609 Edema, unspecified: Secondary | ICD-10-CM | POA: Diagnosis not present

## 2017-11-05 DIAGNOSIS — I872 Venous insufficiency (chronic) (peripheral): Secondary | ICD-10-CM | POA: Diagnosis not present

## 2017-11-10 DIAGNOSIS — M6281 Muscle weakness (generalized): Secondary | ICD-10-CM | POA: Diagnosis not present

## 2017-11-13 DIAGNOSIS — L97919 Non-pressure chronic ulcer of unspecified part of right lower leg with unspecified severity: Secondary | ICD-10-CM | POA: Diagnosis not present

## 2017-11-13 DIAGNOSIS — I83891 Varicose veins of right lower extremities with other complications: Secondary | ICD-10-CM | POA: Diagnosis not present

## 2017-11-13 DIAGNOSIS — I2510 Atherosclerotic heart disease of native coronary artery without angina pectoris: Secondary | ICD-10-CM | POA: Diagnosis not present

## 2017-11-13 DIAGNOSIS — I83019 Varicose veins of right lower extremity with ulcer of unspecified site: Secondary | ICD-10-CM | POA: Diagnosis not present

## 2017-11-13 DIAGNOSIS — M6281 Muscle weakness (generalized): Secondary | ICD-10-CM | POA: Diagnosis not present

## 2017-11-17 DIAGNOSIS — M6281 Muscle weakness (generalized): Secondary | ICD-10-CM | POA: Diagnosis not present

## 2017-11-22 ENCOUNTER — Other Ambulatory Visit: Payer: Self-pay | Admitting: Cardiology

## 2017-11-22 DIAGNOSIS — E785 Hyperlipidemia, unspecified: Secondary | ICD-10-CM

## 2017-11-23 DIAGNOSIS — M6281 Muscle weakness (generalized): Secondary | ICD-10-CM | POA: Diagnosis not present

## 2017-11-24 NOTE — Telephone Encounter (Signed)
Rx request sent to pharmacy.  

## 2017-11-25 DIAGNOSIS — M6281 Muscle weakness (generalized): Secondary | ICD-10-CM | POA: Diagnosis not present

## 2017-11-30 DIAGNOSIS — M6281 Muscle weakness (generalized): Secondary | ICD-10-CM | POA: Diagnosis not present

## 2017-11-30 DIAGNOSIS — R252 Cramp and spasm: Secondary | ICD-10-CM | POA: Diagnosis not present

## 2017-12-03 DIAGNOSIS — M6281 Muscle weakness (generalized): Secondary | ICD-10-CM | POA: Diagnosis not present

## 2017-12-08 DIAGNOSIS — R7303 Prediabetes: Secondary | ICD-10-CM | POA: Diagnosis not present

## 2017-12-08 DIAGNOSIS — I872 Venous insufficiency (chronic) (peripheral): Secondary | ICD-10-CM | POA: Diagnosis not present

## 2017-12-08 DIAGNOSIS — Z9989 Dependence on other enabling machines and devices: Secondary | ICD-10-CM | POA: Diagnosis not present

## 2017-12-08 DIAGNOSIS — R600 Localized edema: Secondary | ICD-10-CM | POA: Diagnosis not present

## 2017-12-08 DIAGNOSIS — G4733 Obstructive sleep apnea (adult) (pediatric): Secondary | ICD-10-CM | POA: Diagnosis not present

## 2017-12-08 DIAGNOSIS — I2510 Atherosclerotic heart disease of native coronary artery without angina pectoris: Secondary | ICD-10-CM | POA: Diagnosis not present

## 2017-12-09 DIAGNOSIS — M6281 Muscle weakness (generalized): Secondary | ICD-10-CM | POA: Diagnosis not present

## 2017-12-10 DIAGNOSIS — G4733 Obstructive sleep apnea (adult) (pediatric): Secondary | ICD-10-CM | POA: Diagnosis not present

## 2017-12-11 DIAGNOSIS — M6281 Muscle weakness (generalized): Secondary | ICD-10-CM | POA: Diagnosis not present

## 2017-12-17 DIAGNOSIS — M6281 Muscle weakness (generalized): Secondary | ICD-10-CM | POA: Diagnosis not present

## 2017-12-21 DIAGNOSIS — I10 Essential (primary) hypertension: Secondary | ICD-10-CM | POA: Diagnosis not present

## 2017-12-22 DIAGNOSIS — M6281 Muscle weakness (generalized): Secondary | ICD-10-CM | POA: Diagnosis not present

## 2017-12-22 DIAGNOSIS — R600 Localized edema: Secondary | ICD-10-CM | POA: Diagnosis not present

## 2017-12-22 DIAGNOSIS — I872 Venous insufficiency (chronic) (peripheral): Secondary | ICD-10-CM | POA: Diagnosis not present

## 2017-12-24 DIAGNOSIS — R590 Localized enlarged lymph nodes: Secondary | ICD-10-CM | POA: Diagnosis not present

## 2017-12-24 DIAGNOSIS — I872 Venous insufficiency (chronic) (peripheral): Secondary | ICD-10-CM | POA: Diagnosis not present

## 2017-12-30 DIAGNOSIS — M6281 Muscle weakness (generalized): Secondary | ICD-10-CM | POA: Diagnosis not present

## 2017-12-31 DIAGNOSIS — R600 Localized edema: Secondary | ICD-10-CM | POA: Diagnosis not present

## 2017-12-31 DIAGNOSIS — I872 Venous insufficiency (chronic) (peripheral): Secondary | ICD-10-CM | POA: Diagnosis not present

## 2018-01-01 DIAGNOSIS — M6281 Muscle weakness (generalized): Secondary | ICD-10-CM | POA: Diagnosis not present

## 2018-01-07 DIAGNOSIS — M6281 Muscle weakness (generalized): Secondary | ICD-10-CM | POA: Diagnosis not present

## 2018-01-11 DIAGNOSIS — M6281 Muscle weakness (generalized): Secondary | ICD-10-CM | POA: Diagnosis not present

## 2018-01-12 DIAGNOSIS — Z471 Aftercare following joint replacement surgery: Secondary | ICD-10-CM | POA: Diagnosis not present

## 2018-01-12 DIAGNOSIS — M1611 Unilateral primary osteoarthritis, right hip: Secondary | ICD-10-CM | POA: Diagnosis not present

## 2018-01-12 DIAGNOSIS — M7061 Trochanteric bursitis, right hip: Secondary | ICD-10-CM | POA: Diagnosis not present

## 2018-01-12 DIAGNOSIS — Z96641 Presence of right artificial hip joint: Secondary | ICD-10-CM | POA: Diagnosis not present

## 2018-01-14 DIAGNOSIS — M5136 Other intervertebral disc degeneration, lumbar region: Secondary | ICD-10-CM | POA: Diagnosis not present

## 2018-01-19 DIAGNOSIS — E7800 Pure hypercholesterolemia, unspecified: Secondary | ICD-10-CM | POA: Diagnosis not present

## 2018-01-19 DIAGNOSIS — I10 Essential (primary) hypertension: Secondary | ICD-10-CM | POA: Diagnosis not present

## 2018-01-19 DIAGNOSIS — E039 Hypothyroidism, unspecified: Secondary | ICD-10-CM | POA: Diagnosis not present

## 2018-01-19 DIAGNOSIS — Z125 Encounter for screening for malignant neoplasm of prostate: Secondary | ICD-10-CM | POA: Diagnosis not present

## 2018-01-22 DIAGNOSIS — I2510 Atherosclerotic heart disease of native coronary artery without angina pectoris: Secondary | ICD-10-CM | POA: Diagnosis not present

## 2018-01-22 DIAGNOSIS — R252 Cramp and spasm: Secondary | ICD-10-CM | POA: Diagnosis not present

## 2018-01-22 DIAGNOSIS — Z0000 Encounter for general adult medical examination without abnormal findings: Secondary | ICD-10-CM | POA: Diagnosis not present

## 2018-01-22 DIAGNOSIS — M47812 Spondylosis without myelopathy or radiculopathy, cervical region: Secondary | ICD-10-CM | POA: Diagnosis not present

## 2018-01-22 DIAGNOSIS — I872 Venous insufficiency (chronic) (peripheral): Secondary | ICD-10-CM | POA: Diagnosis not present

## 2018-01-22 DIAGNOSIS — J387 Other diseases of larynx: Secondary | ICD-10-CM | POA: Diagnosis not present

## 2018-01-22 DIAGNOSIS — K449 Diaphragmatic hernia without obstruction or gangrene: Secondary | ICD-10-CM | POA: Diagnosis not present

## 2018-01-22 DIAGNOSIS — J45909 Unspecified asthma, uncomplicated: Secondary | ICD-10-CM | POA: Diagnosis not present

## 2018-01-29 DIAGNOSIS — M7061 Trochanteric bursitis, right hip: Secondary | ICD-10-CM | POA: Diagnosis not present

## 2018-02-01 DIAGNOSIS — M6281 Muscle weakness (generalized): Secondary | ICD-10-CM | POA: Diagnosis not present

## 2018-02-04 DIAGNOSIS — M6281 Muscle weakness (generalized): Secondary | ICD-10-CM | POA: Diagnosis not present

## 2018-02-05 DIAGNOSIS — R600 Localized edema: Secondary | ICD-10-CM | POA: Diagnosis not present

## 2018-02-09 DIAGNOSIS — M6281 Muscle weakness (generalized): Secondary | ICD-10-CM | POA: Diagnosis not present

## 2018-02-17 DIAGNOSIS — M6281 Muscle weakness (generalized): Secondary | ICD-10-CM | POA: Diagnosis not present

## 2018-02-19 DIAGNOSIS — M6281 Muscle weakness (generalized): Secondary | ICD-10-CM | POA: Diagnosis not present

## 2018-03-02 DIAGNOSIS — M6281 Muscle weakness (generalized): Secondary | ICD-10-CM | POA: Diagnosis not present

## 2018-03-05 DIAGNOSIS — M6281 Muscle weakness (generalized): Secondary | ICD-10-CM | POA: Diagnosis not present

## 2018-03-10 DIAGNOSIS — M6281 Muscle weakness (generalized): Secondary | ICD-10-CM | POA: Diagnosis not present

## 2018-03-15 DIAGNOSIS — I2510 Atherosclerotic heart disease of native coronary artery without angina pectoris: Secondary | ICD-10-CM | POA: Diagnosis not present

## 2018-03-15 DIAGNOSIS — G4733 Obstructive sleep apnea (adult) (pediatric): Secondary | ICD-10-CM | POA: Diagnosis not present

## 2018-03-15 DIAGNOSIS — Z9989 Dependence on other enabling machines and devices: Secondary | ICD-10-CM | POA: Diagnosis not present

## 2018-03-15 DIAGNOSIS — R600 Localized edema: Secondary | ICD-10-CM | POA: Diagnosis not present

## 2018-03-16 DIAGNOSIS — M7061 Trochanteric bursitis, right hip: Secondary | ICD-10-CM | POA: Diagnosis not present

## 2018-03-18 DIAGNOSIS — M6281 Muscle weakness (generalized): Secondary | ICD-10-CM | POA: Diagnosis not present

## 2018-03-23 DIAGNOSIS — M6281 Muscle weakness (generalized): Secondary | ICD-10-CM | POA: Diagnosis not present

## 2018-04-05 DIAGNOSIS — Z8709 Personal history of other diseases of the respiratory system: Secondary | ICD-10-CM | POA: Diagnosis not present

## 2018-04-05 DIAGNOSIS — R042 Hemoptysis: Secondary | ICD-10-CM | POA: Diagnosis not present

## 2018-04-08 DIAGNOSIS — G4733 Obstructive sleep apnea (adult) (pediatric): Secondary | ICD-10-CM | POA: Diagnosis not present

## 2018-04-14 DIAGNOSIS — M6281 Muscle weakness (generalized): Secondary | ICD-10-CM | POA: Diagnosis not present

## 2018-04-19 DIAGNOSIS — M6281 Muscle weakness (generalized): Secondary | ICD-10-CM | POA: Diagnosis not present

## 2018-04-26 DIAGNOSIS — M6281 Muscle weakness (generalized): Secondary | ICD-10-CM | POA: Diagnosis not present

## 2018-04-29 ENCOUNTER — Other Ambulatory Visit: Payer: Self-pay | Admitting: Cardiology

## 2018-05-03 DIAGNOSIS — M6281 Muscle weakness (generalized): Secondary | ICD-10-CM | POA: Diagnosis not present

## 2018-05-07 DIAGNOSIS — M6281 Muscle weakness (generalized): Secondary | ICD-10-CM | POA: Diagnosis not present

## 2018-05-10 DIAGNOSIS — M6281 Muscle weakness (generalized): Secondary | ICD-10-CM | POA: Diagnosis not present

## 2018-05-22 ENCOUNTER — Other Ambulatory Visit: Payer: Self-pay | Admitting: Cardiology

## 2018-05-24 DIAGNOSIS — M6281 Muscle weakness (generalized): Secondary | ICD-10-CM | POA: Diagnosis not present

## 2018-05-26 DIAGNOSIS — M6281 Muscle weakness (generalized): Secondary | ICD-10-CM | POA: Diagnosis not present

## 2018-06-08 DIAGNOSIS — M5136 Other intervertebral disc degeneration, lumbar region: Secondary | ICD-10-CM | POA: Diagnosis not present

## 2018-06-21 ENCOUNTER — Other Ambulatory Visit: Payer: Self-pay | Admitting: Cardiology

## 2018-06-24 DIAGNOSIS — L400 Psoriasis vulgaris: Secondary | ICD-10-CM | POA: Diagnosis not present

## 2018-07-15 ENCOUNTER — Other Ambulatory Visit: Payer: Self-pay | Admitting: Cardiology

## 2018-07-29 DIAGNOSIS — G4733 Obstructive sleep apnea (adult) (pediatric): Secondary | ICD-10-CM | POA: Diagnosis not present

## 2018-09-25 DIAGNOSIS — E039 Hypothyroidism, unspecified: Secondary | ICD-10-CM | POA: Diagnosis not present

## 2018-09-25 DIAGNOSIS — Z96641 Presence of right artificial hip joint: Secondary | ICD-10-CM | POA: Diagnosis not present

## 2018-09-25 DIAGNOSIS — Z955 Presence of coronary angioplasty implant and graft: Secondary | ICD-10-CM | POA: Diagnosis not present

## 2018-09-25 DIAGNOSIS — E876 Hypokalemia: Secondary | ICD-10-CM | POA: Diagnosis not present

## 2018-09-25 DIAGNOSIS — Z9181 History of falling: Secondary | ICD-10-CM | POA: Diagnosis not present

## 2018-09-25 DIAGNOSIS — J449 Chronic obstructive pulmonary disease, unspecified: Secondary | ICD-10-CM | POA: Diagnosis not present

## 2018-09-25 DIAGNOSIS — H547 Unspecified visual loss: Secondary | ICD-10-CM | POA: Diagnosis not present

## 2018-09-25 DIAGNOSIS — K5900 Constipation, unspecified: Secondary | ICD-10-CM | POA: Diagnosis not present

## 2018-09-25 DIAGNOSIS — J309 Allergic rhinitis, unspecified: Secondary | ICD-10-CM | POA: Diagnosis not present

## 2018-09-25 DIAGNOSIS — I10 Essential (primary) hypertension: Secondary | ICD-10-CM | POA: Diagnosis not present

## 2018-09-25 DIAGNOSIS — E785 Hyperlipidemia, unspecified: Secondary | ICD-10-CM | POA: Diagnosis not present

## 2018-09-25 DIAGNOSIS — T7840XD Allergy, unspecified, subsequent encounter: Secondary | ICD-10-CM | POA: Diagnosis not present

## 2018-09-25 DIAGNOSIS — Z836 Family history of other diseases of the respiratory system: Secondary | ICD-10-CM | POA: Diagnosis not present

## 2018-09-25 DIAGNOSIS — E559 Vitamin D deficiency, unspecified: Secondary | ICD-10-CM | POA: Diagnosis not present

## 2018-09-25 DIAGNOSIS — R609 Edema, unspecified: Secondary | ICD-10-CM | POA: Diagnosis not present

## 2018-09-25 DIAGNOSIS — Z818 Family history of other mental and behavioral disorders: Secondary | ICD-10-CM | POA: Diagnosis not present

## 2018-09-25 DIAGNOSIS — G4700 Insomnia, unspecified: Secondary | ICD-10-CM | POA: Diagnosis not present

## 2018-09-25 DIAGNOSIS — G4733 Obstructive sleep apnea (adult) (pediatric): Secondary | ICD-10-CM | POA: Diagnosis not present

## 2018-09-25 DIAGNOSIS — L409 Psoriasis, unspecified: Secondary | ICD-10-CM | POA: Diagnosis not present

## 2018-09-25 DIAGNOSIS — M545 Low back pain: Secondary | ICD-10-CM | POA: Diagnosis not present

## 2018-09-25 DIAGNOSIS — K219 Gastro-esophageal reflux disease without esophagitis: Secondary | ICD-10-CM | POA: Diagnosis not present

## 2018-09-25 DIAGNOSIS — Z87891 Personal history of nicotine dependence: Secondary | ICD-10-CM | POA: Diagnosis not present

## 2018-09-25 DIAGNOSIS — Z820 Family history of epilepsy and other diseases of the nervous system: Secondary | ICD-10-CM | POA: Diagnosis not present

## 2018-09-25 DIAGNOSIS — I2510 Atherosclerotic heart disease of native coronary artery without angina pectoris: Secondary | ICD-10-CM | POA: Diagnosis not present

## 2018-09-25 DIAGNOSIS — Z8669 Personal history of other diseases of the nervous system and sense organs: Secondary | ICD-10-CM | POA: Diagnosis not present

## 2018-09-25 DIAGNOSIS — M159 Polyosteoarthritis, unspecified: Secondary | ICD-10-CM | POA: Diagnosis not present

## 2018-09-28 DIAGNOSIS — L918 Other hypertrophic disorders of the skin: Secondary | ICD-10-CM | POA: Diagnosis not present

## 2018-09-30 ENCOUNTER — Other Ambulatory Visit: Payer: Self-pay | Admitting: Cardiology

## 2018-10-21 DIAGNOSIS — R7309 Other abnormal glucose: Secondary | ICD-10-CM | POA: Diagnosis not present

## 2018-10-21 DIAGNOSIS — R7303 Prediabetes: Secondary | ICD-10-CM | POA: Diagnosis not present

## 2018-10-21 DIAGNOSIS — I10 Essential (primary) hypertension: Secondary | ICD-10-CM | POA: Diagnosis not present

## 2018-10-21 DIAGNOSIS — E7800 Pure hypercholesterolemia, unspecified: Secondary | ICD-10-CM | POA: Diagnosis not present

## 2018-10-27 DIAGNOSIS — G4733 Obstructive sleep apnea (adult) (pediatric): Secondary | ICD-10-CM | POA: Diagnosis not present

## 2018-11-03 DIAGNOSIS — L03115 Cellulitis of right lower limb: Secondary | ICD-10-CM | POA: Diagnosis not present

## 2018-11-03 DIAGNOSIS — S8992XD Unspecified injury of left lower leg, subsequent encounter: Secondary | ICD-10-CM | POA: Diagnosis not present

## 2018-11-04 DIAGNOSIS — I10 Essential (primary) hypertension: Secondary | ICD-10-CM | POA: Diagnosis not present

## 2018-11-04 DIAGNOSIS — R7303 Prediabetes: Secondary | ICD-10-CM | POA: Diagnosis not present

## 2018-11-17 DIAGNOSIS — L03115 Cellulitis of right lower limb: Secondary | ICD-10-CM | POA: Diagnosis not present

## 2018-11-17 DIAGNOSIS — S8992XD Unspecified injury of left lower leg, subsequent encounter: Secondary | ICD-10-CM | POA: Diagnosis not present

## 2018-11-25 ENCOUNTER — Other Ambulatory Visit: Payer: Self-pay | Admitting: Cardiology

## 2018-12-01 DIAGNOSIS — S8992XD Unspecified injury of left lower leg, subsequent encounter: Secondary | ICD-10-CM | POA: Diagnosis not present

## 2018-12-01 DIAGNOSIS — L03115 Cellulitis of right lower limb: Secondary | ICD-10-CM | POA: Diagnosis not present

## 2018-12-07 ENCOUNTER — Telehealth: Payer: Self-pay

## 2018-12-07 NOTE — Telephone Encounter (Signed)
LM2CB  Pt is overdue for Crenshaw f/u appt. There are virtual appts available this week.

## 2018-12-20 DIAGNOSIS — Z23 Encounter for immunization: Secondary | ICD-10-CM | POA: Diagnosis not present

## 2018-12-24 ENCOUNTER — Other Ambulatory Visit: Payer: Self-pay

## 2018-12-24 ENCOUNTER — Other Ambulatory Visit: Payer: Self-pay | Admitting: Cardiology

## 2018-12-24 DIAGNOSIS — E7800 Pure hypercholesterolemia, unspecified: Secondary | ICD-10-CM

## 2018-12-24 DIAGNOSIS — E785 Hyperlipidemia, unspecified: Secondary | ICD-10-CM

## 2018-12-24 MED ORDER — ATORVASTATIN CALCIUM 40 MG PO TABS: 40.0000 mg | ORAL_TABLET | Freq: Every evening | ORAL | 3 refills | Status: DC

## 2018-12-27 DIAGNOSIS — L400 Psoriasis vulgaris: Secondary | ICD-10-CM | POA: Diagnosis not present

## 2019-01-04 DIAGNOSIS — M5136 Other intervertebral disc degeneration, lumbar region: Secondary | ICD-10-CM | POA: Diagnosis not present

## 2019-01-04 DIAGNOSIS — M7061 Trochanteric bursitis, right hip: Secondary | ICD-10-CM | POA: Diagnosis not present

## 2019-01-04 DIAGNOSIS — Z96641 Presence of right artificial hip joint: Secondary | ICD-10-CM | POA: Diagnosis not present

## 2019-01-13 DIAGNOSIS — T148XXA Other injury of unspecified body region, initial encounter: Secondary | ICD-10-CM | POA: Diagnosis not present

## 2019-01-13 DIAGNOSIS — M542 Cervicalgia: Secondary | ICD-10-CM | POA: Diagnosis not present

## 2019-01-17 DIAGNOSIS — M542 Cervicalgia: Secondary | ICD-10-CM | POA: Diagnosis not present

## 2019-01-17 DIAGNOSIS — M5412 Radiculopathy, cervical region: Secondary | ICD-10-CM | POA: Diagnosis not present

## 2019-01-24 ENCOUNTER — Other Ambulatory Visit: Payer: Self-pay

## 2019-01-24 MED ORDER — SPIRONOLACTONE 25 MG PO TABS: ORAL_TABLET | ORAL | 1 refills | Status: DC

## 2019-01-28 DIAGNOSIS — R7303 Prediabetes: Secondary | ICD-10-CM | POA: Diagnosis not present

## 2019-01-28 DIAGNOSIS — E7800 Pure hypercholesterolemia, unspecified: Secondary | ICD-10-CM | POA: Diagnosis not present

## 2019-01-28 DIAGNOSIS — K449 Diaphragmatic hernia without obstruction or gangrene: Secondary | ICD-10-CM | POA: Diagnosis not present

## 2019-01-28 DIAGNOSIS — I10 Essential (primary) hypertension: Secondary | ICD-10-CM | POA: Diagnosis not present

## 2019-01-28 DIAGNOSIS — Z0000 Encounter for general adult medical examination without abnormal findings: Secondary | ICD-10-CM | POA: Diagnosis not present

## 2019-01-28 DIAGNOSIS — Z125 Encounter for screening for malignant neoplasm of prostate: Secondary | ICD-10-CM | POA: Diagnosis not present

## 2019-01-28 DIAGNOSIS — I2510 Atherosclerotic heart disease of native coronary artery without angina pectoris: Secondary | ICD-10-CM | POA: Diagnosis not present

## 2019-02-04 ENCOUNTER — Other Ambulatory Visit: Payer: Self-pay | Admitting: Cardiology

## 2019-02-04 ENCOUNTER — Telehealth: Payer: Self-pay | Admitting: Cardiology

## 2019-02-04 DIAGNOSIS — M542 Cervicalgia: Secondary | ICD-10-CM | POA: Diagnosis not present

## 2019-02-04 NOTE — Telephone Encounter (Signed)
LVM for patient to call and schedule followup with Dr. Crenshaw. °

## 2019-02-04 NOTE — Telephone Encounter (Signed)
Please see note below. 

## 2019-02-04 NOTE — Telephone Encounter (Signed)
Pt overdue 6 month f/u. Please contact pt for future appointment.

## 2019-02-07 DIAGNOSIS — E559 Vitamin D deficiency, unspecified: Secondary | ICD-10-CM | POA: Diagnosis not present

## 2019-02-07 DIAGNOSIS — G4700 Insomnia, unspecified: Secondary | ICD-10-CM | POA: Diagnosis not present

## 2019-02-07 DIAGNOSIS — E039 Hypothyroidism, unspecified: Secondary | ICD-10-CM | POA: Diagnosis not present

## 2019-02-07 DIAGNOSIS — I10 Essential (primary) hypertension: Secondary | ICD-10-CM | POA: Diagnosis not present

## 2019-02-07 DIAGNOSIS — E669 Obesity, unspecified: Secondary | ICD-10-CM | POA: Diagnosis not present

## 2019-02-07 DIAGNOSIS — Z96641 Presence of right artificial hip joint: Secondary | ICD-10-CM | POA: Diagnosis not present

## 2019-02-07 DIAGNOSIS — Z96652 Presence of left artificial knee joint: Secondary | ICD-10-CM | POA: Diagnosis not present

## 2019-02-07 DIAGNOSIS — M545 Low back pain: Secondary | ICD-10-CM | POA: Diagnosis not present

## 2019-02-07 DIAGNOSIS — E876 Hypokalemia: Secondary | ICD-10-CM | POA: Diagnosis not present

## 2019-02-07 DIAGNOSIS — K219 Gastro-esophageal reflux disease without esophagitis: Secondary | ICD-10-CM | POA: Diagnosis not present

## 2019-02-07 DIAGNOSIS — Z6838 Body mass index (BMI) 38.0-38.9, adult: Secondary | ICD-10-CM | POA: Diagnosis not present

## 2019-02-07 DIAGNOSIS — M159 Polyosteoarthritis, unspecified: Secondary | ICD-10-CM | POA: Diagnosis not present

## 2019-02-07 DIAGNOSIS — G4733 Obstructive sleep apnea (adult) (pediatric): Secondary | ICD-10-CM | POA: Diagnosis not present

## 2019-02-07 DIAGNOSIS — J449 Chronic obstructive pulmonary disease, unspecified: Secondary | ICD-10-CM | POA: Diagnosis not present

## 2019-02-07 DIAGNOSIS — H547 Unspecified visual loss: Secondary | ICD-10-CM | POA: Diagnosis not present

## 2019-02-07 DIAGNOSIS — E785 Hyperlipidemia, unspecified: Secondary | ICD-10-CM | POA: Diagnosis not present

## 2019-02-09 DIAGNOSIS — M5030 Other cervical disc degeneration, unspecified cervical region: Secondary | ICD-10-CM | POA: Diagnosis not present

## 2019-02-09 DIAGNOSIS — M542 Cervicalgia: Secondary | ICD-10-CM | POA: Diagnosis not present

## 2019-02-14 ENCOUNTER — Other Ambulatory Visit: Payer: Self-pay

## 2019-02-14 MED ORDER — KLOR-CON M10 10 MEQ PO TBCR: 10.0000 meq | EXTENDED_RELEASE_TABLET | Freq: Every day | ORAL | 0 refills | Status: DC

## 2019-02-15 ENCOUNTER — Other Ambulatory Visit: Payer: Self-pay

## 2019-02-15 MED ORDER — KLOR-CON M10 10 MEQ PO TBCR: 10.0000 meq | EXTENDED_RELEASE_TABLET | Freq: Every day | ORAL | 0 refills | Status: DC

## 2019-02-21 DIAGNOSIS — M5136 Other intervertebral disc degeneration, lumbar region: Secondary | ICD-10-CM | POA: Diagnosis not present

## 2019-02-21 DIAGNOSIS — Z6841 Body Mass Index (BMI) 40.0 and over, adult: Secondary | ICD-10-CM | POA: Diagnosis not present

## 2019-02-21 DIAGNOSIS — M542 Cervicalgia: Secondary | ICD-10-CM | POA: Diagnosis not present

## 2019-02-22 DIAGNOSIS — G4733 Obstructive sleep apnea (adult) (pediatric): Secondary | ICD-10-CM | POA: Diagnosis not present

## 2019-02-27 IMAGING — CT CT HIP*R* W/O CM
3 of 6 series · 5 of 16 positions shown, 6 images · non-contrast
Comparison: Right hip x-rays dated June 22, 2017.

CLINICAL DATA: Right hip pain. Right hip of placement two weeks
ago.

EXAM:
CT OF THE RIGHT HIP WITHOUT CONTRAST
TECHNIQUE: Multidetector CT imaging of the right hip was performed according to
the standard protocol. Multiplanar CT image reconstructions were
also generated.

[Series 6: lfov lower extremity 2.00 br60 s3 cor · coronal · 0.48mm/px · 1 of 155 slices shown]
[im 78/155  soft-tissue]
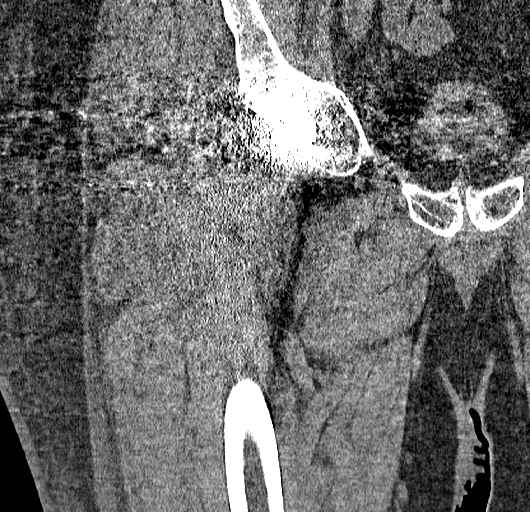

[Series 8: lfov lower extremity 2.00 br60 s3 sag · sagittal · 0.48mm/px · 1 of 126 slices shown]
[im 63/126  soft-tissue]
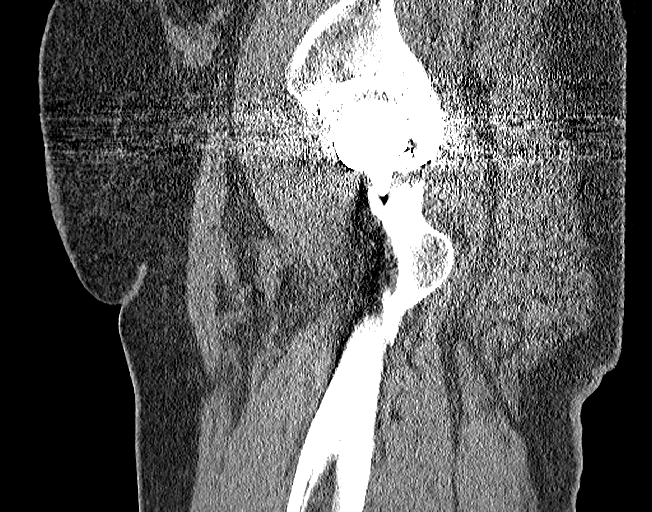

[Series 10: lfov lower extremity 2.00 br40 s3 ax · axial · 0.44mm/px · z∈[+1144,+1386]mm · 3 of 122 slices shown, 4 images]
[im 1/122  soft-tissue]
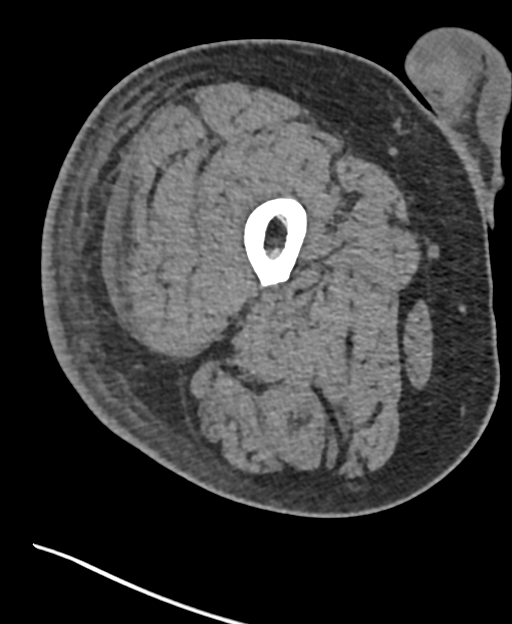
[im 1/122  bone]
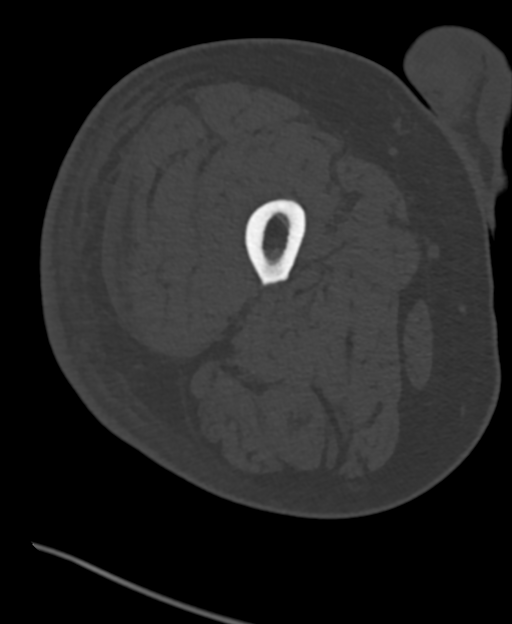
[im 61/122  soft-tissue]
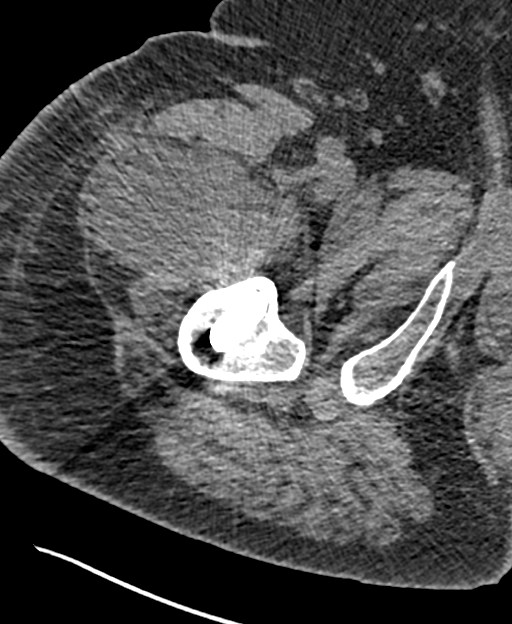
[im 122/122  soft-tissue]
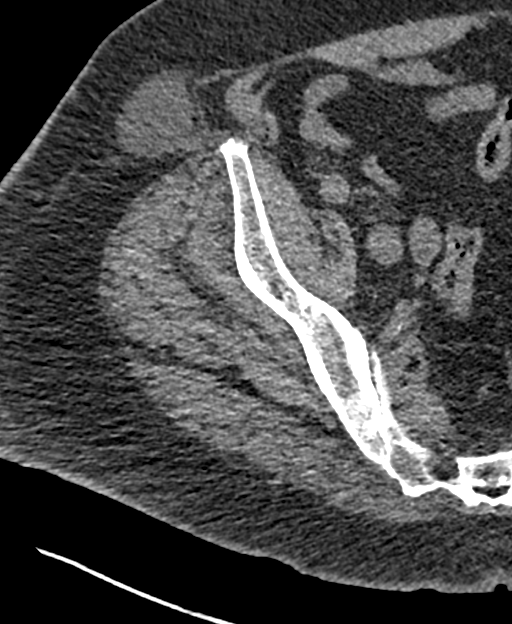

[5 of 16 positions shown; findings below may reference images not displayed]

FINDINGS: Bones/Joint/Cartilage

Prior right total hip arthroplasty. New prominent subsidence of the
femoral stem measuring 16 mm. No acute fracture or dislocation.

Ligaments

Suboptimally assessed by CT.

Muscles and Tendons

Deep to the tensor fascia lata, there is a 5.5 x 7.4 x 7.0 cm (AP by
transverse by CC) slightly hyperdense fluid collection, likely
hematoma. No muscle atrophy.

Soft tissues

Incompletely visualized 4.4 x 5.0 x 5.0 cm (AP by transverse by CC)
subcutaneous low-density fluid collection along the anterior hip at
the level of the anterior inferior iliac spine, likely a seroma.
Mild subcutaneous edema along the lateral hip.
IMPRESSION: 1. Prior right total hip arthroplasty with new prominent subsidence
of the femoral stem, measuring 16 mm, concerning for loosening.
2. 7.4 cm slightly hyperdense fluid collection deep to the tensor
fascia lata, likely a hematoma.
3. Incompletely visualized 5.0 cm subcutaneous low-density fluid
collection along the anterior hip at the level of the anterior
inferior iliac spine, likely a seroma.

## 2019-02-28 ENCOUNTER — Other Ambulatory Visit: Payer: Self-pay | Admitting: Cardiology

## 2019-02-28 MED ORDER — KLOR-CON M10 10 MEQ PO TBCR: 10.0000 meq | EXTENDED_RELEASE_TABLET | Freq: Every day | ORAL | 0 refills | Status: DC

## 2019-03-02 IMAGING — RF DG HIP (WITH PELVIS) OPERATIVE*R*
1 series · 1 of 1 positions shown · non-contrast
Comparison: Prior CT from 07/07/2017.

CLINICAL DATA: Intraoperative fluoroscopy for right total hip
arthroplasty.

EXAM:
DG C-ARM 61-120 MIN; OPERATIVE RIGHT HIP WITH PELVIS

[Series 1: run · 1 of 1 slices shown]
[im 1/1]
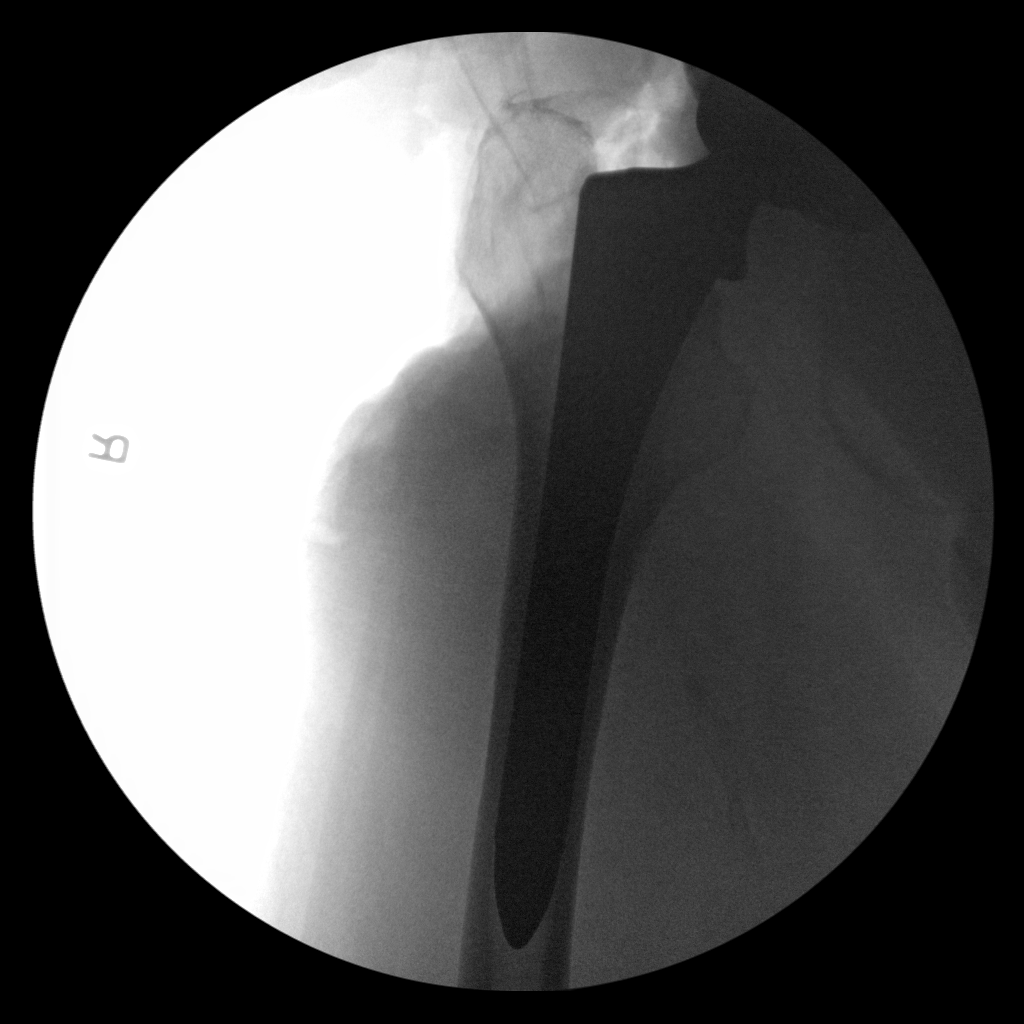

[1 of 1 positions shown; findings below may reference images not displayed]

FINDINGS: Single spot intraoperative fluoroscopic view of the femoral stem of
a right total hip arthroplasty in AP projection. Femoral stem in
place and appears normally aligned. No adverse features.
IMPRESSION: Intraoperative fluoroscopy of femoral stem of right total hip
arthroplasty without adverse features.

## 2019-03-02 IMAGING — CR DG PORTABLE PELVIS
1 series · 1 of 1 positions shown · non-contrast
Comparison: Fluoroscopic images of same day. Radiograph of June 22, 2017.

CLINICAL DATA: Status post right hip replacement.

EXAM:
PORTABLE PELVIS 1-2 VIEWS

[AP]
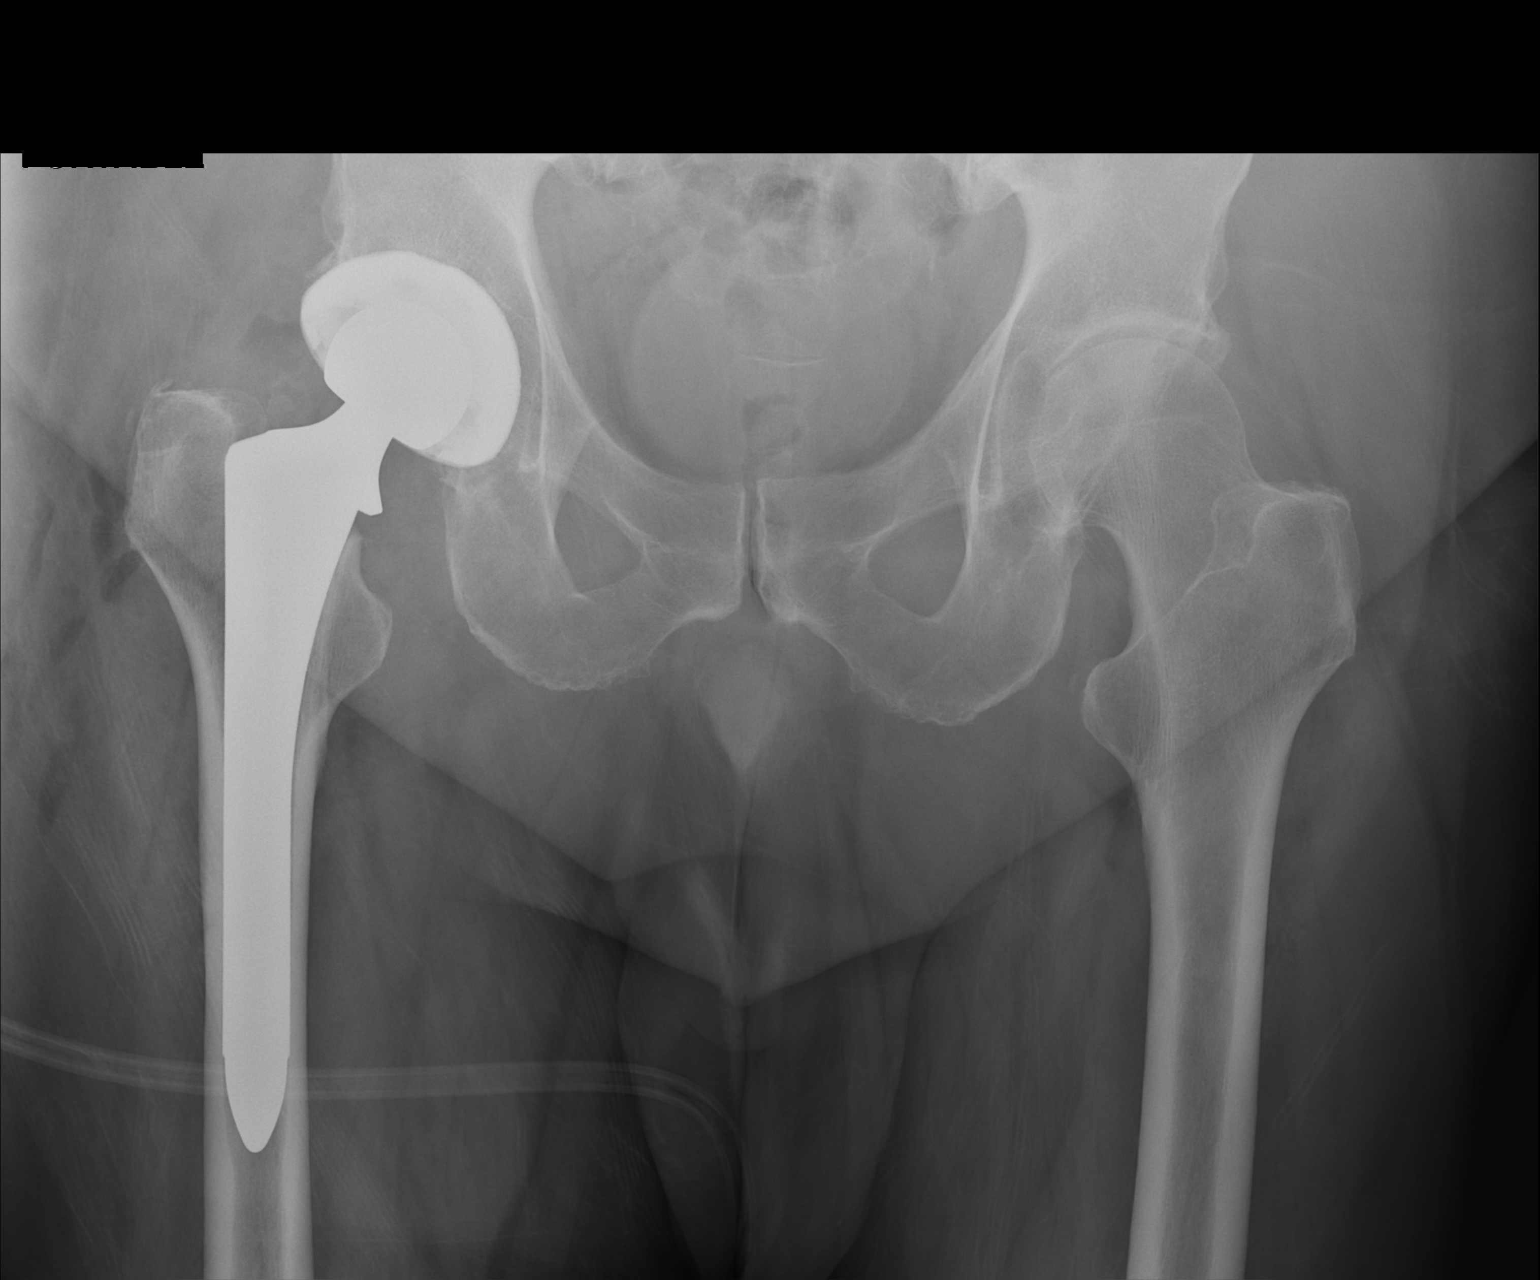

[1 of 1 positions shown; findings below may reference images not displayed]

FINDINGS: The femoral and acetabular components appear to be well situated. No
fracture or dislocation is noted. Expected postoperative changes are
noted in the surrounding soft tissues.
IMPRESSION: Status post right total hip arthroplasty.

## 2019-03-07 DIAGNOSIS — M48062 Spinal stenosis, lumbar region with neurogenic claudication: Secondary | ICD-10-CM | POA: Diagnosis not present

## 2019-03-07 DIAGNOSIS — M5136 Other intervertebral disc degeneration, lumbar region: Secondary | ICD-10-CM | POA: Diagnosis not present

## 2019-03-07 DIAGNOSIS — M4802 Spinal stenosis, cervical region: Secondary | ICD-10-CM | POA: Diagnosis not present

## 2019-03-07 DIAGNOSIS — M5030 Other cervical disc degeneration, unspecified cervical region: Secondary | ICD-10-CM | POA: Diagnosis not present

## 2019-03-10 DIAGNOSIS — N401 Enlarged prostate with lower urinary tract symptoms: Secondary | ICD-10-CM | POA: Diagnosis not present

## 2019-03-10 DIAGNOSIS — I10 Essential (primary) hypertension: Secondary | ICD-10-CM | POA: Diagnosis not present

## 2019-03-10 DIAGNOSIS — Z125 Encounter for screening for malignant neoplasm of prostate: Secondary | ICD-10-CM | POA: Diagnosis not present

## 2019-03-10 DIAGNOSIS — R7303 Prediabetes: Secondary | ICD-10-CM | POA: Diagnosis not present

## 2019-03-10 DIAGNOSIS — Z0000 Encounter for general adult medical examination without abnormal findings: Secondary | ICD-10-CM | POA: Diagnosis not present

## 2019-03-10 DIAGNOSIS — R252 Cramp and spasm: Secondary | ICD-10-CM | POA: Diagnosis not present

## 2019-03-10 DIAGNOSIS — E7800 Pure hypercholesterolemia, unspecified: Secondary | ICD-10-CM | POA: Diagnosis not present

## 2019-03-18 ENCOUNTER — Other Ambulatory Visit: Payer: Self-pay | Admitting: Cardiology

## 2019-03-21 ENCOUNTER — Ambulatory Visit: Payer: Medicare PPO | Admitting: Cardiology

## 2019-03-22 ENCOUNTER — Other Ambulatory Visit: Payer: Self-pay | Admitting: Cardiology

## 2019-03-22 DIAGNOSIS — M542 Cervicalgia: Secondary | ICD-10-CM | POA: Diagnosis not present

## 2019-03-22 NOTE — Telephone Encounter (Signed)
Rx request sent to pharmacy.  

## 2019-03-25 DIAGNOSIS — Z20828 Contact with and (suspected) exposure to other viral communicable diseases: Secondary | ICD-10-CM | POA: Diagnosis not present

## 2019-03-29 NOTE — Progress Notes (Signed)
 Primary Physician/Referring:  Pharr, Walter, MD  Patient ID: [name], male    DOB: 04/03/1941, 78 y.o.   MRN: [id number]  Chief Complaint  Patient presents with  . Coronary Artery Disease  . Hypertension   HPI:    HPI: [name]  is a 78 y.o. Caucasian male with past medical history of venous insufficiency with prior venous stasis ulcer, OSA on CPAP, hypertension, hypercholesterolemia, CAD S/P MI and stent to LAD in 1987 and again in 1997, repeat stenting to LAD stenosis with DES in April 2016 presents for annual visit.  He has not had recurrence of angina and not used S/L NTG recently. He is now been compliant with CPAP, stable leg edema, has not been wearing support stockings. Otherwise doing well and no change in chronic dyspnea.   Past Medical History:  Diagnosis Date  . Allergic asthma   . Barrett's esophagus   . BPH (benign prostatic hyperplasia)   . CAD (coronary artery disease)   . COPD (chronic obstructive pulmonary disease) (HCC)   . GERD (gastroesophageal reflux disease)   . Hiatal hernia   . Hyperlipidemia   . Hypothyroidism   . Psoriasis   . Sleep apnea    wears CPAP nightly   Past Surgical History:  Procedure Laterality Date  . COLONOSCOPY  03/2017  . CORONARY ANGIOPLASTY WITH STENT PLACEMENT  08/15/14   PCI of pLAD Xience alpine DES  . EYE SURGERY  01/2017   eyelids lifted  . LEFT HEART CATHETERIZATION WITH CORONARY ANGIOGRAM N/A 08/15/2014   Procedure: LEFT HEART CATHETERIZATION WITH CORONARY ANGIOGRAM;  Surgeon: Peter M Jordan, MD;  Location: [facility];  Service: Cardiovascular;  Laterality: N/A;  . left knee arthroscopy    . NASAL POLYP EXCISION  1953  . NASAL SINUS SURGERY    . TONSILLECTOMY    . TOTAL HIP ARTHROPLASTY Right 06/22/2017   Procedure: RIGHT TOTAL HIP ARTHROPLASTY ANTERIOR APPROACH;  Surgeon: Swinteck, Brian, MD;  Location: [facility];  Service: Orthopedics;  Laterality: Right;  Needs RNFA  . TOTAL HIP REVISION Right 07/10/2017   Procedure: RIGHT TOTAL HIP REVISION FEMORAL COMPONENT;  Surgeon: Swinteck, Brian, MD;  Location: [facility];  Service: Orthopedics;  Laterality: Right;  Needs RNFA   Social History   Socioeconomic History  . Marital status: Married    Spouse name: Not on file  . Number of children: 2  . Years of education: Not on file  . Highest education level: Not on file  Occupational History  . Occupation: Minister  Social Needs  . Financial resource strain: Not on file  . Food insecurity    Worry: Not on file    Inability: Not on file  . Transportation needs    Medical: Not on file    Non-medical: Not on file  Tobacco Use  . Smoking status: Former Smoker    Packs/day: 3.00    Years: 33.00    Pack years: 99.00    Types: Pipe, Cigars    Quit date: 04/28/1985    Years since quitting: 33.9  . Smokeless tobacco: Never Used  Substance and Sexual Activity  . Alcohol use: Yes    Alcohol/week: 2.0 standard drinks    Types: 1 Glasses of wine, 1 Shots of liquor per week  . Drug use: No  . Sexual activity: Not on file  Lifestyle  . Physical activity    Days per week: Not on file    Minutes per session: Not on file  .   Primary Physician/Referring:  Deland Pretty, MD  Patient ID: [name], male    DOB: 1941-01-23, 78 y.o.   MRN: [id number]  Chief Complaint  Patient presents with  . Coronary Artery Disease  . Hypertension   HPI:    HPI: [name]  is a 78 y.o. Caucasian male with past medical history of venous insufficiency with prior venous stasis ulcer, OSA on CPAP, hypertension, hypercholesterolemia, CAD S/P MI and stent to LAD in 1987 and again in 1997, repeat stenting to LAD stenosis with DES in April 2016 presents for annual visit.  He has not had recurrence of angina and not used S/L NTG recently. He is now been compliant with CPAP, stable leg edema, has not been wearing support stockings. Otherwise doing well and no change in chronic dyspnea.   Past Medical History:  Diagnosis Date  . Allergic asthma   . Barrett's esophagus   . BPH (benign prostatic hyperplasia)   . CAD (coronary artery disease)   . COPD (chronic obstructive pulmonary disease) ([place])   . GERD (gastroesophageal reflux disease)   . Hiatal hernia   . Hyperlipidemia   . Hypothyroidism   . Psoriasis   . Sleep apnea    wears CPAP nightly   Past Surgical History:  Procedure Laterality Date  . COLONOSCOPY  03/2017  . CORONARY ANGIOPLASTY WITH STENT PLACEMENT  08/15/14   PCI of pLAD Xience alpine DES  . EYE SURGERY  01/2017   eyelids lifted  . LEFT HEART CATHETERIZATION WITH CORONARY ANGIOGRAM N/A 08/15/2014   Procedure: LEFT HEART CATHETERIZATION WITH CORONARY ANGIOGRAM;  Surgeon: Peter M Martinique, MD;  Location: [facility];  Service: Cardiovascular;  Laterality: N/A;  . left knee arthroscopy    . NASAL POLYP EXCISION  1953  . NASAL SINUS SURGERY    . TONSILLECTOMY    . TOTAL HIP ARTHROPLASTY Right 06/22/2017   Procedure: RIGHT TOTAL HIP ARTHROPLASTY ANTERIOR APPROACH;  Surgeon: Rod Can, MD;  Location: [facility] [facility];  Service: Orthopedics;  Laterality: Right;  Needs RNFA  . TOTAL HIP REVISION Right 07/10/2017   Procedure: RIGHT TOTAL HIP REVISION FEMORAL COMPONENT;  Surgeon: Rod Can, MD;  Location: [facility];  Service: Orthopedics;  Laterality: Right;  Needs RNFA   Social History   Socioeconomic History  . Marital status: Married    Spouse name: Not on file  . Number of children: 2  . Years of education: Not on file  . Highest education level: Not on file  Occupational History  . Occupation: Company secretary  Social Needs  . Financial resource strain: Not on file  . Food insecurity    Worry: Not on file    Inability: Not on file  . Transportation needs    Medical: Not on file    Non-medical: Not on file  Tobacco Use  . Smoking status: Former Smoker    Packs/day: 3.00    Years: 33.00    Pack years: 99.00    Types: Pipe, Cigars    Quit date: 04/28/1985    Years since quitting: 33.9  . Smokeless tobacco: Never Used  Substance and Sexual Activity  . Alcohol use: Yes    Alcohol/week: 2.0 standard drinks    Types: 1 Glasses of wine, 1 Shots of liquor per week  . Drug use: No  . Sexual activity: Not on file  Lifestyle  . Physical activity    Days per week: Not on file    Minutes per session: Not on file  .  Primary Physician/Referring:  Deland Pretty, MD  Patient ID: [name], male    DOB: 1941-01-23, 78 y.o.   MRN: [id number]  Chief Complaint  Patient presents with  . Coronary Artery Disease  . Hypertension   HPI:    HPI: [name]  is a 78 y.o. Caucasian male with past medical history of venous insufficiency with prior venous stasis ulcer, OSA on CPAP, hypertension, hypercholesterolemia, CAD S/P MI and stent to LAD in 1987 and again in 1997, repeat stenting to LAD stenosis with DES in April 2016 presents for annual visit.  He has not had recurrence of angina and not used S/L NTG recently. He is now been compliant with CPAP, stable leg edema, has not been wearing support stockings. Otherwise doing well and no change in chronic dyspnea.   Past Medical History:  Diagnosis Date  . Allergic asthma   . Barrett's esophagus   . BPH (benign prostatic hyperplasia)   . CAD (coronary artery disease)   . COPD (chronic obstructive pulmonary disease) ([place])   . GERD (gastroesophageal reflux disease)   . Hiatal hernia   . Hyperlipidemia   . Hypothyroidism   . Psoriasis   . Sleep apnea    wears CPAP nightly   Past Surgical History:  Procedure Laterality Date  . COLONOSCOPY  03/2017  . CORONARY ANGIOPLASTY WITH STENT PLACEMENT  08/15/14   PCI of pLAD Xience alpine DES  . EYE SURGERY  01/2017   eyelids lifted  . LEFT HEART CATHETERIZATION WITH CORONARY ANGIOGRAM N/A 08/15/2014   Procedure: LEFT HEART CATHETERIZATION WITH CORONARY ANGIOGRAM;  Surgeon: Peter M Martinique, MD;  Location: [facility];  Service: Cardiovascular;  Laterality: N/A;  . left knee arthroscopy    . NASAL POLYP EXCISION  1953  . NASAL SINUS SURGERY    . TONSILLECTOMY    . TOTAL HIP ARTHROPLASTY Right 06/22/2017   Procedure: RIGHT TOTAL HIP ARTHROPLASTY ANTERIOR APPROACH;  Surgeon: Rod Can, MD;  Location: [facility];  Service: Orthopedics;  Laterality: Right;  Needs RNFA  . TOTAL HIP REVISION Right 07/10/2017   Procedure: RIGHT TOTAL HIP REVISION FEMORAL COMPONENT;  Surgeon: Rod Can, MD;  Location: [facility];  Service: Orthopedics;  Laterality: Right;  Needs RNFA   Social History   Socioeconomic History  . Marital status: Married    Spouse name: Not on file  . Number of children: 2  . Years of education: Not on file  . Highest education level: Not on file  Occupational History  . Occupation: Company secretary  Social Needs  . Financial resource strain: Not on file  . Food insecurity    Worry: Not on file    Inability: Not on file  . Transportation needs    Medical: Not on file    Non-medical: Not on file  Tobacco Use  . Smoking status: Former Smoker    Packs/day: 3.00    Years: 33.00    Pack years: 99.00    Types: Pipe, Cigars    Quit date: 04/28/1985    Years since quitting: 33.9  . Smokeless tobacco: Never Used  Substance and Sexual Activity  . Alcohol use: Yes    Alcohol/week: 2.0 standard drinks    Types: 1 Glasses of wine, 1 Shots of liquor per week  . Drug use: No  . Sexual activity: Not on file  Lifestyle  . Physical activity    Days per week: Not on file    Minutes per session: Not on file  .   Primary Physician/Referring:  Pharr, Walter, MD  Patient ID: [name], male    DOB: 04/03/1941, 78 y.o.   MRN: [id number]  Chief Complaint  Patient presents with  . Coronary Artery Disease  . Hypertension   HPI:    HPI: [name]  is a 78 y.o. Caucasian male with past medical history of venous insufficiency with prior venous stasis ulcer, OSA on CPAP, hypertension, hypercholesterolemia, CAD S/P MI and stent to LAD in 1987 and again in 1997, repeat stenting to LAD stenosis with DES in April 2016 presents for annual visit.  He has not had recurrence of angina and not used S/L NTG recently. He is now been compliant with CPAP, stable leg edema, has not been wearing support stockings. Otherwise doing well and no change in chronic dyspnea.   Past Medical History:  Diagnosis Date  . Allergic asthma   . Barrett's esophagus   . BPH (benign prostatic hyperplasia)   . CAD (coronary artery disease)   . COPD (chronic obstructive pulmonary disease) (HCC)   . GERD (gastroesophageal reflux disease)   . Hiatal hernia   . Hyperlipidemia   . Hypothyroidism   . Psoriasis   . Sleep apnea    wears CPAP nightly   Past Surgical History:  Procedure Laterality Date  . COLONOSCOPY  03/2017  . CORONARY ANGIOPLASTY WITH STENT PLACEMENT  08/15/14   PCI of pLAD Xience alpine DES  . EYE SURGERY  01/2017   eyelids lifted  . LEFT HEART CATHETERIZATION WITH CORONARY ANGIOGRAM N/A 08/15/2014   Procedure: LEFT HEART CATHETERIZATION WITH CORONARY ANGIOGRAM;  Surgeon: Peter M Jordan, MD;  Location: [facility];  Service: Cardiovascular;  Laterality: N/A;  . left knee arthroscopy    . NASAL POLYP EXCISION  1953  . NASAL SINUS SURGERY    . TONSILLECTOMY    . TOTAL HIP ARTHROPLASTY Right 06/22/2017   Procedure: RIGHT TOTAL HIP ARTHROPLASTY ANTERIOR APPROACH;  Surgeon: Swinteck, Brian, MD;  Location: [facility];  Service: Orthopedics;  Laterality: Right;  Needs RNFA  . TOTAL HIP REVISION Right 07/10/2017   Procedure: RIGHT TOTAL HIP REVISION FEMORAL COMPONENT;  Surgeon: Swinteck, Brian, MD;  Location: [facility];  Service: Orthopedics;  Laterality: Right;  Needs RNFA   Social History   Socioeconomic History  . Marital status: Married    Spouse name: Not on file  . Number of children: 2  . Years of education: Not on file  . Highest education level: Not on file  Occupational History  . Occupation: Minister  Social Needs  . Financial resource strain: Not on file  . Food insecurity    Worry: Not on file    Inability: Not on file  . Transportation needs    Medical: Not on file    Non-medical: Not on file  Tobacco Use  . Smoking status: Former Smoker    Packs/day: 3.00    Years: 33.00    Pack years: 99.00    Types: Pipe, Cigars    Quit date: 04/28/1985    Years since quitting: 33.9  . Smokeless tobacco: Never Used  Substance and Sexual Activity  . Alcohol use: Yes    Alcohol/week: 2.0 standard drinks    Types: 1 Glasses of wine, 1 Shots of liquor per week  . Drug use: No  . Sexual activity: Not on file  Lifestyle  . Physical activity    Days per week: Not on file    Minutes per session: Not on file  .

## 2019-03-30 ENCOUNTER — Encounter: Payer: Self-pay | Admitting: Cardiology

## 2019-03-30 ENCOUNTER — Other Ambulatory Visit: Payer: Self-pay

## 2019-03-30 ENCOUNTER — Ambulatory Visit (INDEPENDENT_AMBULATORY_CARE_PROVIDER_SITE_OTHER): Payer: Medicare PPO | Admitting: Cardiology

## 2019-03-30 VITALS — BP 128/77 | HR 96 | Ht 70.0 in | Wt 275.0 lb

## 2019-03-30 DIAGNOSIS — Z66 Do not resuscitate: Secondary | ICD-10-CM | POA: Diagnosis not present

## 2019-03-30 DIAGNOSIS — R600 Localized edema: Secondary | ICD-10-CM | POA: Diagnosis not present

## 2019-03-30 DIAGNOSIS — Z9989 Dependence on other enabling machines and devices: Secondary | ICD-10-CM | POA: Diagnosis not present

## 2019-03-30 DIAGNOSIS — E785 Hyperlipidemia, unspecified: Secondary | ICD-10-CM

## 2019-03-30 DIAGNOSIS — G4733 Obstructive sleep apnea (adult) (pediatric): Secondary | ICD-10-CM

## 2019-03-30 DIAGNOSIS — I2510 Atherosclerotic heart disease of native coronary artery without angina pectoris: Secondary | ICD-10-CM | POA: Diagnosis not present

## 2019-03-30 MED ORDER — EZETIMIBE 10 MG PO TABS: 10.0000 mg | ORAL_TABLET | Freq: Every evening | ORAL | 6 refills | Status: DC

## 2019-03-30 NOTE — Patient Instructions (Signed)
Please get labs at [facility] in 6 weeks.

## 2019-04-16 ENCOUNTER — Other Ambulatory Visit: Payer: Self-pay | Admitting: Cardiology

## 2019-04-25 DIAGNOSIS — M545 Low back pain: Secondary | ICD-10-CM | POA: Diagnosis not present

## 2019-04-25 DIAGNOSIS — M542 Cervicalgia: Secondary | ICD-10-CM | POA: Diagnosis not present

## 2019-05-02 ENCOUNTER — Other Ambulatory Visit: Payer: Self-pay | Admitting: Cardiology

## 2019-05-03 DIAGNOSIS — M545 Low back pain: Secondary | ICD-10-CM | POA: Diagnosis not present

## 2019-05-03 DIAGNOSIS — M542 Cervicalgia: Secondary | ICD-10-CM | POA: Diagnosis not present

## 2019-05-09 DIAGNOSIS — M542 Cervicalgia: Secondary | ICD-10-CM | POA: Diagnosis not present

## 2019-05-09 DIAGNOSIS — M545 Low back pain: Secondary | ICD-10-CM | POA: Diagnosis not present

## 2019-05-17 DIAGNOSIS — M545 Low back pain: Secondary | ICD-10-CM | POA: Diagnosis not present

## 2019-05-17 DIAGNOSIS — M542 Cervicalgia: Secondary | ICD-10-CM | POA: Diagnosis not present

## 2019-05-23 DIAGNOSIS — M542 Cervicalgia: Secondary | ICD-10-CM | POA: Diagnosis not present

## 2019-05-23 DIAGNOSIS — M545 Low back pain: Secondary | ICD-10-CM | POA: Diagnosis not present

## 2019-05-24 DIAGNOSIS — G4733 Obstructive sleep apnea (adult) (pediatric): Secondary | ICD-10-CM | POA: Diagnosis not present

## 2019-06-06 DIAGNOSIS — M545 Low back pain: Secondary | ICD-10-CM | POA: Diagnosis not present

## 2019-06-06 DIAGNOSIS — M542 Cervicalgia: Secondary | ICD-10-CM | POA: Diagnosis not present

## 2019-06-07 ENCOUNTER — Ambulatory Visit: Payer: Medicare PPO | Attending: Internal Medicine

## 2019-06-09 ENCOUNTER — Ambulatory Visit: Payer: Medicare PPO

## 2019-06-14 DIAGNOSIS — M25511 Pain in right shoulder: Secondary | ICD-10-CM | POA: Diagnosis not present

## 2019-06-15 DIAGNOSIS — M542 Cervicalgia: Secondary | ICD-10-CM | POA: Diagnosis not present

## 2019-06-15 DIAGNOSIS — M545 Low back pain: Secondary | ICD-10-CM | POA: Diagnosis not present

## 2019-06-20 DIAGNOSIS — M545 Low back pain: Secondary | ICD-10-CM | POA: Diagnosis not present

## 2019-06-20 DIAGNOSIS — M542 Cervicalgia: Secondary | ICD-10-CM | POA: Diagnosis not present

## 2019-06-28 DIAGNOSIS — M542 Cervicalgia: Secondary | ICD-10-CM | POA: Diagnosis not present

## 2019-06-28 DIAGNOSIS — M545 Low back pain: Secondary | ICD-10-CM | POA: Diagnosis not present

## 2019-07-05 DIAGNOSIS — M545 Low back pain: Secondary | ICD-10-CM | POA: Diagnosis not present

## 2019-07-05 DIAGNOSIS — M542 Cervicalgia: Secondary | ICD-10-CM | POA: Diagnosis not present

## 2019-07-20 DIAGNOSIS — M25511 Pain in right shoulder: Secondary | ICD-10-CM | POA: Diagnosis not present

## 2019-07-20 DIAGNOSIS — M19011 Primary osteoarthritis, right shoulder: Secondary | ICD-10-CM | POA: Diagnosis not present

## 2019-07-21 DIAGNOSIS — M542 Cervicalgia: Secondary | ICD-10-CM | POA: Diagnosis not present

## 2019-07-21 DIAGNOSIS — M545 Low back pain: Secondary | ICD-10-CM | POA: Diagnosis not present

## 2019-07-25 DIAGNOSIS — M5136 Other intervertebral disc degeneration, lumbar region: Secondary | ICD-10-CM | POA: Diagnosis not present

## 2019-07-25 DIAGNOSIS — Z79891 Long term (current) use of opiate analgesic: Secondary | ICD-10-CM | POA: Diagnosis not present

## 2019-07-25 DIAGNOSIS — M5412 Radiculopathy, cervical region: Secondary | ICD-10-CM | POA: Diagnosis not present

## 2019-08-01 DIAGNOSIS — M545 Low back pain: Secondary | ICD-10-CM | POA: Diagnosis not present

## 2019-08-01 DIAGNOSIS — M542 Cervicalgia: Secondary | ICD-10-CM | POA: Diagnosis not present

## 2019-08-15 DIAGNOSIS — M542 Cervicalgia: Secondary | ICD-10-CM | POA: Diagnosis not present

## 2019-08-15 DIAGNOSIS — M545 Low back pain: Secondary | ICD-10-CM | POA: Diagnosis not present

## 2019-08-16 DIAGNOSIS — M5412 Radiculopathy, cervical region: Secondary | ICD-10-CM | POA: Diagnosis not present

## 2019-08-16 DIAGNOSIS — M5136 Other intervertebral disc degeneration, lumbar region: Secondary | ICD-10-CM | POA: Diagnosis not present

## 2019-08-22 DIAGNOSIS — M542 Cervicalgia: Secondary | ICD-10-CM | POA: Diagnosis not present

## 2019-08-22 DIAGNOSIS — M545 Low back pain: Secondary | ICD-10-CM | POA: Diagnosis not present

## 2019-08-23 DIAGNOSIS — G4733 Obstructive sleep apnea (adult) (pediatric): Secondary | ICD-10-CM | POA: Diagnosis not present

## 2019-08-29 DIAGNOSIS — M545 Low back pain: Secondary | ICD-10-CM | POA: Diagnosis not present

## 2019-08-29 DIAGNOSIS — M542 Cervicalgia: Secondary | ICD-10-CM | POA: Diagnosis not present

## 2019-09-07 DIAGNOSIS — E7800 Pure hypercholesterolemia, unspecified: Secondary | ICD-10-CM | POA: Diagnosis not present

## 2019-09-07 DIAGNOSIS — R7309 Other abnormal glucose: Secondary | ICD-10-CM | POA: Diagnosis not present

## 2019-09-12 DIAGNOSIS — M542 Cervicalgia: Secondary | ICD-10-CM | POA: Diagnosis not present

## 2019-09-12 DIAGNOSIS — M545 Low back pain: Secondary | ICD-10-CM | POA: Diagnosis not present

## 2019-09-13 DIAGNOSIS — E118 Type 2 diabetes mellitus with unspecified complications: Secondary | ICD-10-CM | POA: Diagnosis not present

## 2019-09-15 DIAGNOSIS — I2510 Atherosclerotic heart disease of native coronary artery without angina pectoris: Secondary | ICD-10-CM | POA: Diagnosis not present

## 2019-09-15 DIAGNOSIS — I10 Essential (primary) hypertension: Secondary | ICD-10-CM | POA: Diagnosis not present

## 2019-09-15 DIAGNOSIS — E118 Type 2 diabetes mellitus with unspecified complications: Secondary | ICD-10-CM | POA: Diagnosis not present

## 2019-09-19 DIAGNOSIS — M542 Cervicalgia: Secondary | ICD-10-CM | POA: Diagnosis not present

## 2019-09-19 DIAGNOSIS — M545 Low back pain: Secondary | ICD-10-CM | POA: Diagnosis not present

## 2019-09-28 DIAGNOSIS — M545 Low back pain: Secondary | ICD-10-CM | POA: Diagnosis not present

## 2019-09-28 DIAGNOSIS — M542 Cervicalgia: Secondary | ICD-10-CM | POA: Diagnosis not present

## 2019-10-13 DIAGNOSIS — M542 Cervicalgia: Secondary | ICD-10-CM | POA: Diagnosis not present

## 2019-10-13 DIAGNOSIS — M545 Low back pain: Secondary | ICD-10-CM | POA: Diagnosis not present

## 2019-10-20 DIAGNOSIS — M542 Cervicalgia: Secondary | ICD-10-CM | POA: Diagnosis not present

## 2019-10-20 DIAGNOSIS — M545 Low back pain: Secondary | ICD-10-CM | POA: Diagnosis not present

## 2019-10-27 DIAGNOSIS — M545 Low back pain: Secondary | ICD-10-CM | POA: Diagnosis not present

## 2019-10-27 DIAGNOSIS — M542 Cervicalgia: Secondary | ICD-10-CM | POA: Diagnosis not present

## 2019-11-03 DIAGNOSIS — M542 Cervicalgia: Secondary | ICD-10-CM | POA: Diagnosis not present

## 2019-11-03 DIAGNOSIS — M545 Low back pain: Secondary | ICD-10-CM | POA: Diagnosis not present

## 2019-11-10 DIAGNOSIS — I10 Essential (primary) hypertension: Secondary | ICD-10-CM | POA: Diagnosis not present

## 2019-11-10 DIAGNOSIS — Z7982 Long term (current) use of aspirin: Secondary | ICD-10-CM | POA: Diagnosis not present

## 2019-11-10 DIAGNOSIS — M5030 Other cervical disc degeneration, unspecified cervical region: Secondary | ICD-10-CM | POA: Diagnosis not present

## 2019-11-10 DIAGNOSIS — Z79891 Long term (current) use of opiate analgesic: Secondary | ICD-10-CM | POA: Diagnosis not present

## 2019-11-10 DIAGNOSIS — M5136 Other intervertebral disc degeneration, lumbar region: Secondary | ICD-10-CM | POA: Diagnosis not present

## 2019-11-10 DIAGNOSIS — I2510 Atherosclerotic heart disease of native coronary artery without angina pectoris: Secondary | ICD-10-CM | POA: Diagnosis not present

## 2019-11-10 DIAGNOSIS — E118 Type 2 diabetes mellitus with unspecified complications: Secondary | ICD-10-CM | POA: Diagnosis not present

## 2019-11-18 DIAGNOSIS — M542 Cervicalgia: Secondary | ICD-10-CM | POA: Diagnosis not present

## 2019-11-18 DIAGNOSIS — M545 Low back pain: Secondary | ICD-10-CM | POA: Diagnosis not present

## 2019-11-21 DIAGNOSIS — M5030 Other cervical disc degeneration, unspecified cervical region: Secondary | ICD-10-CM | POA: Diagnosis not present

## 2019-11-21 DIAGNOSIS — N529 Male erectile dysfunction, unspecified: Secondary | ICD-10-CM | POA: Diagnosis not present

## 2019-11-24 DIAGNOSIS — M5136 Other intervertebral disc degeneration, lumbar region: Secondary | ICD-10-CM | POA: Diagnosis not present

## 2019-12-03 ENCOUNTER — Other Ambulatory Visit: Payer: Self-pay | Admitting: Cardiology

## 2019-12-08 DIAGNOSIS — L570 Actinic keratosis: Secondary | ICD-10-CM | POA: Diagnosis not present

## 2019-12-08 DIAGNOSIS — L400 Psoriasis vulgaris: Secondary | ICD-10-CM | POA: Diagnosis not present

## 2019-12-21 DIAGNOSIS — E118 Type 2 diabetes mellitus with unspecified complications: Secondary | ICD-10-CM | POA: Diagnosis not present

## 2019-12-21 DIAGNOSIS — I10 Essential (primary) hypertension: Secondary | ICD-10-CM | POA: Diagnosis not present

## 2019-12-21 DIAGNOSIS — Z7982 Long term (current) use of aspirin: Secondary | ICD-10-CM | POA: Diagnosis not present

## 2019-12-21 DIAGNOSIS — G4733 Obstructive sleep apnea (adult) (pediatric): Secondary | ICD-10-CM | POA: Diagnosis not present

## 2019-12-21 DIAGNOSIS — Z79899 Other long term (current) drug therapy: Secondary | ICD-10-CM | POA: Diagnosis not present

## 2019-12-21 DIAGNOSIS — L409 Psoriasis, unspecified: Secondary | ICD-10-CM | POA: Diagnosis not present

## 2019-12-21 DIAGNOSIS — I2510 Atherosclerotic heart disease of native coronary artery without angina pectoris: Secondary | ICD-10-CM | POA: Diagnosis not present

## 2019-12-22 DIAGNOSIS — M5030 Other cervical disc degeneration, unspecified cervical region: Secondary | ICD-10-CM | POA: Diagnosis not present

## 2019-12-29 DIAGNOSIS — D229 Melanocytic nevi, unspecified: Secondary | ICD-10-CM | POA: Diagnosis not present

## 2019-12-29 DIAGNOSIS — L821 Other seborrheic keratosis: Secondary | ICD-10-CM | POA: Diagnosis not present

## 2019-12-29 DIAGNOSIS — L819 Disorder of pigmentation, unspecified: Secondary | ICD-10-CM | POA: Diagnosis not present

## 2019-12-29 DIAGNOSIS — D1801 Hemangioma of skin and subcutaneous tissue: Secondary | ICD-10-CM | POA: Diagnosis not present

## 2019-12-29 DIAGNOSIS — L814 Other melanin hyperpigmentation: Secondary | ICD-10-CM | POA: Diagnosis not present

## 2019-12-29 DIAGNOSIS — L400 Psoriasis vulgaris: Secondary | ICD-10-CM | POA: Diagnosis not present

## 2020-01-05 DIAGNOSIS — Z1159 Encounter for screening for other viral diseases: Secondary | ICD-10-CM | POA: Diagnosis not present

## 2020-01-09 DIAGNOSIS — K2270 Barrett's esophagus without dysplasia: Secondary | ICD-10-CM | POA: Diagnosis not present

## 2020-01-09 DIAGNOSIS — K449 Diaphragmatic hernia without obstruction or gangrene: Secondary | ICD-10-CM | POA: Diagnosis not present

## 2020-01-11 DIAGNOSIS — K2270 Barrett's esophagus without dysplasia: Secondary | ICD-10-CM | POA: Diagnosis not present

## 2020-01-12 DIAGNOSIS — Z23 Encounter for immunization: Secondary | ICD-10-CM | POA: Diagnosis not present

## 2020-01-13 DIAGNOSIS — M542 Cervicalgia: Secondary | ICD-10-CM | POA: Diagnosis not present

## 2020-01-13 DIAGNOSIS — M545 Low back pain: Secondary | ICD-10-CM | POA: Diagnosis not present

## 2020-01-16 ENCOUNTER — Other Ambulatory Visit: Payer: Self-pay | Admitting: Cardiology

## 2020-01-16 DIAGNOSIS — M545 Low back pain: Secondary | ICD-10-CM | POA: Diagnosis not present

## 2020-01-16 DIAGNOSIS — M542 Cervicalgia: Secondary | ICD-10-CM | POA: Diagnosis not present

## 2020-01-19 DIAGNOSIS — M545 Low back pain: Secondary | ICD-10-CM | POA: Diagnosis not present

## 2020-01-19 DIAGNOSIS — M542 Cervicalgia: Secondary | ICD-10-CM | POA: Diagnosis not present

## 2020-01-23 DIAGNOSIS — M542 Cervicalgia: Secondary | ICD-10-CM | POA: Diagnosis not present

## 2020-01-23 DIAGNOSIS — M545 Low back pain: Secondary | ICD-10-CM | POA: Diagnosis not present

## 2020-01-25 DIAGNOSIS — M542 Cervicalgia: Secondary | ICD-10-CM | POA: Diagnosis not present

## 2020-01-25 DIAGNOSIS — M545 Low back pain: Secondary | ICD-10-CM | POA: Diagnosis not present

## 2020-02-01 DIAGNOSIS — M5451 Vertebrogenic low back pain: Secondary | ICD-10-CM | POA: Diagnosis not present

## 2020-02-01 DIAGNOSIS — M542 Cervicalgia: Secondary | ICD-10-CM | POA: Diagnosis not present

## 2020-02-03 DIAGNOSIS — M542 Cervicalgia: Secondary | ICD-10-CM | POA: Diagnosis not present

## 2020-02-03 DIAGNOSIS — M5451 Vertebrogenic low back pain: Secondary | ICD-10-CM | POA: Diagnosis not present

## 2020-02-06 DIAGNOSIS — M5451 Vertebrogenic low back pain: Secondary | ICD-10-CM | POA: Diagnosis not present

## 2020-02-06 DIAGNOSIS — M542 Cervicalgia: Secondary | ICD-10-CM | POA: Diagnosis not present

## 2020-02-11 ENCOUNTER — Other Ambulatory Visit: Payer: Self-pay | Admitting: Cardiology

## 2020-02-11 DIAGNOSIS — E7800 Pure hypercholesterolemia, unspecified: Secondary | ICD-10-CM

## 2020-02-17 ENCOUNTER — Other Ambulatory Visit: Payer: Self-pay | Admitting: Cardiology

## 2020-02-27 DIAGNOSIS — M542 Cervicalgia: Secondary | ICD-10-CM | POA: Diagnosis not present

## 2020-02-27 DIAGNOSIS — M5416 Radiculopathy, lumbar region: Secondary | ICD-10-CM | POA: Diagnosis not present

## 2020-02-28 DIAGNOSIS — M25551 Pain in right hip: Secondary | ICD-10-CM | POA: Diagnosis not present

## 2020-02-28 DIAGNOSIS — M7061 Trochanteric bursitis, right hip: Secondary | ICD-10-CM | POA: Diagnosis not present

## 2020-02-29 DIAGNOSIS — M5416 Radiculopathy, lumbar region: Secondary | ICD-10-CM | POA: Diagnosis not present

## 2020-02-29 DIAGNOSIS — M542 Cervicalgia: Secondary | ICD-10-CM | POA: Diagnosis not present

## 2020-03-05 DIAGNOSIS — M5416 Radiculopathy, lumbar region: Secondary | ICD-10-CM | POA: Diagnosis not present

## 2020-03-05 DIAGNOSIS — M542 Cervicalgia: Secondary | ICD-10-CM | POA: Diagnosis not present

## 2020-03-07 DIAGNOSIS — M5416 Radiculopathy, lumbar region: Secondary | ICD-10-CM | POA: Diagnosis not present

## 2020-03-07 DIAGNOSIS — M542 Cervicalgia: Secondary | ICD-10-CM | POA: Diagnosis not present

## 2020-03-12 DIAGNOSIS — M542 Cervicalgia: Secondary | ICD-10-CM | POA: Diagnosis not present

## 2020-03-12 DIAGNOSIS — M5416 Radiculopathy, lumbar region: Secondary | ICD-10-CM | POA: Diagnosis not present

## 2020-03-14 DIAGNOSIS — M5416 Radiculopathy, lumbar region: Secondary | ICD-10-CM | POA: Diagnosis not present

## 2020-03-14 DIAGNOSIS — M542 Cervicalgia: Secondary | ICD-10-CM | POA: Diagnosis not present

## 2020-03-20 DIAGNOSIS — G4733 Obstructive sleep apnea (adult) (pediatric): Secondary | ICD-10-CM | POA: Diagnosis not present

## 2020-03-26 DIAGNOSIS — I10 Essential (primary) hypertension: Secondary | ICD-10-CM | POA: Diagnosis not present

## 2020-03-26 DIAGNOSIS — E118 Type 2 diabetes mellitus with unspecified complications: Secondary | ICD-10-CM | POA: Diagnosis not present

## 2020-03-26 DIAGNOSIS — Z79899 Other long term (current) drug therapy: Secondary | ICD-10-CM | POA: Diagnosis not present

## 2020-03-26 DIAGNOSIS — I2510 Atherosclerotic heart disease of native coronary artery without angina pectoris: Secondary | ICD-10-CM | POA: Diagnosis not present

## 2020-03-29 ENCOUNTER — Encounter: Payer: Self-pay | Admitting: Cardiology

## 2020-03-29 ENCOUNTER — Ambulatory Visit: Payer: Medicare PPO | Admitting: Cardiology

## 2020-03-29 ENCOUNTER — Other Ambulatory Visit: Payer: Self-pay

## 2020-03-29 VITALS — BP 109/77 | HR 70 | Resp 16 | Ht 70.0 in | Wt 235.0 lb

## 2020-03-29 DIAGNOSIS — I493 Ventricular premature depolarization: Secondary | ICD-10-CM | POA: Diagnosis not present

## 2020-03-29 DIAGNOSIS — E7800 Pure hypercholesterolemia, unspecified: Secondary | ICD-10-CM | POA: Diagnosis not present

## 2020-03-29 DIAGNOSIS — I453 Trifascicular block: Secondary | ICD-10-CM | POA: Diagnosis not present

## 2020-03-29 DIAGNOSIS — R0609 Other forms of dyspnea: Secondary | ICD-10-CM

## 2020-03-29 DIAGNOSIS — R0600 Dyspnea, unspecified: Secondary | ICD-10-CM

## 2020-03-29 DIAGNOSIS — I2510 Atherosclerotic heart disease of native coronary artery without angina pectoris: Secondary | ICD-10-CM

## 2020-03-29 DIAGNOSIS — I10 Essential (primary) hypertension: Secondary | ICD-10-CM

## 2020-03-29 NOTE — Progress Notes (Signed)
[facility]  Primary Physician/Referring:  Deland Pretty, MD  Patient ID: [name], male    DOB: [birth year], 79 y.o.   MRN: [id number]  Chief Complaint  Patient presents with  . Follow-up    1 year  . Coronary Artery Disease  . Hyperlipidemia   HPI:    HPI: KENAN MOODIE  is a 79 y.o. Caucasian male with past medical history of venous insufficiency with prior venous stasis ulcer, OSA on CPAP, hypertension, hypercholesterolemia, CAD S/P MI and stent to LAD in 1987 and again in 1997, repeat stenting to LAD stenosis with DES in April 2016 presents for annual visit.   He has not had recurrence of angina and not used S/L NTG recently. He is now been compliant with CPAP.  He was started on Ozempic and since then has lost about 30 pounds in weight with complete resolution of leg edema, improvement in dyspnea.  However he discontinued Lipitor unknowingly.  His recent lipids had shown elevated LDL and he is back on Lipitor now.  States that otherwise he is doing well except for severe chronic back pain.  Past Medical History:  Diagnosis Date  . Allergic asthma   . Barrett's esophagus   . BPH (benign prostatic hyperplasia)   . CAD (coronary artery disease)   . COPD (chronic obstructive pulmonary disease) ([place])   . GERD (gastroesophageal reflux disease)   . Hiatal hernia   . Hyperlipidemia   . Hypothyroidism   . Psoriasis   . Sleep apnea    wears CPAP nightly   Past Surgical History:  Procedure Laterality Date  . COLONOSCOPY  03/2017  . CORONARY ANGIOPLASTY WITH STENT PLACEMENT  08/15/14   PCI of pLAD Xience alpine DES  . EYE SURGERY  01/2017   eyelids lifted  . LEFT HEART CATHETERIZATION WITH CORONARY ANGIOGRAM N/A 08/15/2014   Procedure: LEFT HEART CATHETERIZATION WITH CORONARY ANGIOGRAM;  Surgeon: Peter M Martinique, MD;  Location: [facility] CATH LAB;  Service: Cardiovascular;  Laterality: N/A;  . left knee arthroscopy    . NASAL POLYP EXCISION  1953  . NASAL SINUS SURGERY    .  TONSILLECTOMY    . TOTAL HIP ARTHROPLASTY Right 06/22/2017   Procedure: RIGHT TOTAL HIP ARTHROPLASTY ANTERIOR APPROACH;  Surgeon: Rod Can, MD;  Location: [place];  Service: Orthopedics;  Laterality: Right;  Needs RNFA  . TOTAL HIP REVISION Right 07/10/2017   Procedure: RIGHT TOTAL HIP REVISION FEMORAL COMPONENT;  Surgeon: Rod Can, MD;  Location: [place];  Service: Orthopedics;  Laterality: Right;  Needs RNFA    Social History   Tobacco Use  . Smoking status: Former Smoker    Packs/day: 3.00    Years: 33.00    Pack years: 99.00    Types: Pipe, Cigars    Quit date: 04/28/1985    Years since quitting: 34.9  . Smokeless tobacco: Never Used  Substance Use Topics  . Alcohol use: Yes    Alcohol/week: 2.0 standard drinks    Types: 1 Glasses of wine, 1 Shots of liquor per week   Marital Status: Married  ROS  Review of Systems  Cardiovascular: Positive for dyspnea on exertion. Negative for chest pain and leg swelling.  Musculoskeletal: Positive for arthritis and back pain.  Gastrointestinal: Negative for melena.   Objective   Vitals with BMI 03/29/2020 03/30/2019 07/13/2017  Height _0  _1  -  Weight 235 lbs 275 lbs -  BMI 25.63 89.37 -  Systolic 342 876 811  Diastolic  77 77 72  Pulse 70 96 87   Blood pressure 109/77, pulse 70, resp. rate 16, height _0  (1.778 m), weight 235 lb (106.6 kg), SpO2 97 %.      Physical Exam Vitals reviewed.  Constitutional:      Appearance: He is well-developed. He is obese.     Comments: Well built and morbidly obese  HENT:     Head: Normocephalic and atraumatic.  Cardiovascular:     Rate and Rhythm: Normal rate and regular rhythm.     Pulses: Intact distal pulses.          Carotid pulses are 2+ on the right side and 2+ on the left side.      Dorsalis pedis pulses are 2+ on the right side and 2+ on the left side.       Posterior tibial pulses are 2+ on the right side and 2+ on the left side.     Heart sounds: Normal heart  sounds.     Comments: Femoral pulse and popliteal pulse feeble (Body habitus). No pedal edema. Chronic venostasis dermatitis noted.  Pulmonary:     Effort: Pulmonary effort is normal. No accessory muscle usage or respiratory distress.     Breath sounds: Normal breath sounds.  Abdominal:     General: Bowel sounds are normal.     Palpations: Abdomen is soft.  Musculoskeletal:        General: Normal range of motion.     Cervical back: Normal range of motion.  Skin:    General: Skin is warm and dry.  Neurological:     Mental Status: He is oriented to person, place, and time.    Radiology: No results found.  Laboratory examination:   No results for input(s): NA, K, CL, CO2, GLUCOSE, BUN, CREATININE, CALCIUM, GFRNONAA, GFRAA in the last 8760 hours. CMP Latest Ref Rng & Units 07/11/2017 06/29/2017 06/23/2017  Glucose 65 - 99 mg/dL 196(H) 104(H) 204(H)  BUN 6 - 20 mg/dL _1 Creatinine 0.61 - 1.24 mg/dL 0.93 0.79 0.90  Sodium 135 - 145 mmol/L 136 137 137  Potassium 3.5 - 5.1 mmol/L 4.3 4.1 4.0  Chloride 101 - 111 mmol/L 105 101 103  CO2 22 - 32 mmol/L _2 Calcium 8.9 - 10.3 mg/dL 8.4(L) 8.7(L) 8.5(L)  Total Protein 6.5 - 8.1 g/dL - 6.5 -  Total Bilirubin 0.3 - 1.2 mg/dL - 1.1 -  Alkaline Phos 38 - 126 U/L - 102 -  AST 15 - 41 U/L - 22 -  ALT 17 - 63 U/L - 21 -   CBC Latest Ref Rng & Units 07/13/2017 07/12/2017 07/11/2017  WBC 4.0 - 10.5 K/uL 7.1 9.8 7.6  Hemoglobin 13.0 - 17.0 g/dL 10.8(L) 10.0(L) 9.4(L)  Hematocrit 39 - 52 % 34.4(L) 31.2(L) 28.3(L)  Platelets 150 - 400 K/uL 207 212 185   Lipid Panel     Component Value Date/Time   CHOL 126 11/27/2014 0811   TRIG 92 11/27/2014 0811   HDL 42 11/27/2014 0811   CHOLHDL 3.0 11/27/2014 0811   VLDL 18 11/27/2014 0811   LDLCALC 66 11/27/2014 0811   HEMOGLOBIN A1C No results found for: HGBA1C, MPG TSH No results for input(s): TSH in the last 8760 hours.    External labs:   Cholesterol, total 115.000 m 11/10/2019 HDL  37.000 mg 03/26/2020 LDL-C 109.000 ca 03/26/2020 Triglycerides 112.000 m 03/26/2020  A1C 5.500 12/21/2019 TSH 0.745 03/10/2019  Creatinine, Serum 0.910 MG/ 11/10/2019  ALT (SGPT) 37.000 IU/ 03/10/2019   Labs 03/10/2019:  CBC normal, BUN 17, creatinine 1.0, EGFR greater than 60 ml, CMP otherwise normal, potassium 4.6. A1c 6.8%.  Total cholesterol 175, triglycerides 144, HDL 33, LDL 116.  PSA normal, TSH normal.  Medications   Current Outpatient Medications on File Prior to Visit  Medication Sig Dispense Refill  . albuterol (PROVENTIL HFA;VENTOLIN HFA) 108 (90 BASE) MCG/ACT inhaler Inhale 1-2 puffs into the lungs every 6 (six) hours as needed for wheezing or shortness of breath. 1 Inhaler 11  . amLODipine (NORVASC) 5 MG tablet TAKE 1 TABLET BY MOUTH DAILY *NEED APPOINTMENT FOR REFILLS* 90 tablet 1  . aspirin EC 81 MG tablet Take 81 mg by mouth daily.    Marland Kitchen atorvastatin (LIPITOR) 40 MG tablet TAKE 1 TABLET BY MOUTH EVERY DAY IN THE EVENING 30 tablet 11  . Cholecalciferol (VITAMIN D3) 1000 UNITS CAPS Take 1,000 Units by mouth daily.    . Cyanocobalamin (VITAMIN B-12) 5000 MCG TBDP Take 5,000 mcg by mouth daily.     . cycloSPORINE (RESTASIS) 0.05 % ophthalmic emulsion Place 1 drop into both eyes 2 (two) times daily.      . fexofenadine (ALLEGRA) 180 MG tablet Take 180 mg by mouth daily.    . fluticasone (CUTIVATE) 0.05 % cream Apply 1 application topically 2 (two) times daily.   2  . furosemide (LASIX) 20 MG tablet Take 2 tablets (40 mg total) by mouth daily. (Patient taking differently: Take 40 mg by mouth 2 (two) times daily. ) 180 tablet 3  . Glucosamine-Chondroit-Vit C-Mn (GLUCOSAMINE 1500 COMPLEX PO) Take 2 capsules by mouth daily.    Marland Kitchen HYDROcodone-acetaminophen (NORCO/VICODIN) 5-325 MG tablet Take 1 tablet by mouth 3 (three) times daily as needed.    Marland Kitchen KLOR-CON M10 10 MEQ tablet TAKE 1 TABLET BY MOUTH EVERY DAY 90 tablet 1  . Levothyroxine Sodium 150 MCG CAPS Take 150 mcg by mouth  daily before breakfast.     . Melatonin 5 MG CAPS Take by mouth daily.    . Multiple Vitamins-Minerals (SENIOR MULTIVITAMIN PLUS PO) Take 1 tablet by mouth daily.     . naproxen sodium (ALEVE) 220 MG tablet Take 220 mg by mouth. 2 in the am; 2 in the pm    . nitroGLYCERIN (NITROLINGUAL) 0.4 MG/SPRAY spray Place 1 spray under the tongue every 5 (five) minutes x 3 doses as needed for chest pain. Use as directed as needed 12 g 12  . NON FORMULARY CPAP machine every night    . Omega-3 1000 MG CAPS Take 1000 mg by mouth once daily    . OVER THE COUNTER MEDICATION Take 1 tablet by mouth daily. Prostate Plus Supplement - "Cranberry on Steroids"    . pantoprazole (PROTONIX) 40 MG tablet Take 30-60 min before first meal of the day (Patient taking differently: Take 40 mg by mouth daily before breakfast. )    . Semaglutide,0.25 or 0.5MG/DOS, (OZEMPIC, 0.25 OR 0.5 MG/DOSE,) 2 MG/1.5ML SOPN Ozempic 0.25 mg or 0.5 mg (2 mg/1.5 mL) subcutaneous pen injector    . vitamin C (ASCORBIC ACID) 500 MG tablet Take 1,000 mg by mouth daily.     No current facility-administered medications on file prior to visit.     Cardiac Studies:   Echo 09/30/2016: Normal LV systolic function, EF 11-94% with mild diffuse hypokinesis.  Grade 1 diastolic dysfunction.  Moderate left atrial enlargement, mitral annular calcification.   EKG   EKG 03/29/2020: Sinus rhythm with first-degree block  at rate of 96 bpm, left atrial enlargement, left axis deviation, left intrafascicular block.  Right bundle branch block.  Trifascicular block.  Frequent PVCs in a quadrigeminy pattern.  Compared to 06/17/2018, frequent PVCs new.  Assessment     ICD-10-CM   1. Atherosclerosis of native coronary artery of native heart without angina pectoris  I25.10 EKG 12-Lead    PCV ECHOCARDIOGRAM COMPLETE    PCV MYOCARDIAL PERFUSION WITH LEXISCAN      2. Dyspnea on exertion  R06.00 PCV ECHOCARDIOGRAM COMPLETE    PCV MYOCARDIAL PERFUSION WITH LEXISCAN       3. Trifascicular block  I45.3   4. Primary hypertension  I10   5. Pure hypercholesterolemia  E78.00   6. Frequent PVCs  I49.3 PCV MYOCARDIAL PERFUSION WITH LEXISCAN        No orders of the defined types were placed in this encounter.  Medications Discontinued During This Encounter  Medication Reason  . bumetanide (BUMEX) 1 MG tablet No longer needed (for PRN medications)  . docusate sodium (COLACE) 100 MG capsule Change in therapy  . ezetimibe (ZETIA) 10 MG tablet Patient Preference  . Lutein-Zeaxanthin 25-5 MG CAPS Patient Preference  . sildenafil (VIAGRA) 100 MG tablet Patient Preference     Recommendations:   Mr. Naquan Garman is a 79 y.o. Caucasian male with past medical history of venous insufficiency with prior venous stasis ulcer, OSA on CPAP, hypertension, GERD, morbid obesity, trifascicular block, hypercholesterolemia and CAD S/P MI and stent to LAD in 1987 and again in 1997, repeat stenting to LAD stenosis with DES in April 2016 presents for annual visit. He has not had recurrence of angina.  This is annual visit, he was started on Ozempic and since then has lost about 30 pounds in weight, with complete resolution of leg edema.  Except for chronic mild dyspnea no other specific symptoms apart from back pain.  EKG reveals frequent PVCs.  This is new to him.  He has underlying trifascicular block but no evidence to suggest he needs a pacemaker.  His last cardiac work-up has been greater than 5 to 6 years ago. Schedule for a Lexiscan Nuclear stress test to evaluate for myocardial ischemia. Patient unable to do treadmill stress testing due to back pain. Will schedule for an echocardiogram.  With regard to hyperlipidemia, after he was started on Ozempic he had accidentally discontinued Lipitor which he has started back 2 days ago.  Repeat labs are pending in 2 months by his PCP, goal LDL less than 70.  If lipids are not at goal he should restart Zetia.  I would like to see him back in  3 months instead of on annual basis in view of abnormal EKG and also hyperlipidemia.  Otherwise blood pressure is well controlled, no clinical evidence of heart failure.   Adrian Prows, MD, [facility] 03/29/2020, 11:59 AM Office: [phone] Pager: [phone]

## 2020-03-31 ENCOUNTER — Other Ambulatory Visit: Payer: Self-pay | Admitting: Cardiology

## 2020-03-31 DIAGNOSIS — E785 Hyperlipidemia, unspecified: Secondary | ICD-10-CM

## 2020-04-16 ENCOUNTER — Ambulatory Visit: Payer: Medicare PPO

## 2020-04-16 ENCOUNTER — Other Ambulatory Visit: Payer: Self-pay

## 2020-04-16 DIAGNOSIS — I493 Ventricular premature depolarization: Secondary | ICD-10-CM

## 2020-04-16 DIAGNOSIS — I2510 Atherosclerotic heart disease of native coronary artery without angina pectoris: Secondary | ICD-10-CM

## 2020-04-16 DIAGNOSIS — R0609 Other forms of dyspnea: Secondary | ICD-10-CM | POA: Diagnosis not present

## 2020-04-17 DIAGNOSIS — E7800 Pure hypercholesterolemia, unspecified: Secondary | ICD-10-CM | POA: Diagnosis not present

## 2020-04-17 DIAGNOSIS — E118 Type 2 diabetes mellitus with unspecified complications: Secondary | ICD-10-CM | POA: Diagnosis not present

## 2020-04-17 DIAGNOSIS — I10 Essential (primary) hypertension: Secondary | ICD-10-CM | POA: Diagnosis not present

## 2020-04-17 DIAGNOSIS — I2510 Atherosclerotic heart disease of native coronary artery without angina pectoris: Secondary | ICD-10-CM | POA: Diagnosis not present

## 2020-04-18 ENCOUNTER — Other Ambulatory Visit: Payer: Self-pay | Admitting: Cardiology

## 2020-04-18 DIAGNOSIS — E785 Hyperlipidemia, unspecified: Secondary | ICD-10-CM

## 2020-04-19 ENCOUNTER — Other Ambulatory Visit: Payer: Self-pay

## 2020-04-19 MED ORDER — EZETIMIBE 10 MG PO TABS
10.0000 mg | ORAL_TABLET | Freq: Every evening | ORAL | 1 refills | Status: DC
Start: 2020-04-19 — End: 2020-06-28

## 2020-04-23 DIAGNOSIS — S99921A Unspecified injury of right foot, initial encounter: Secondary | ICD-10-CM | POA: Diagnosis not present

## 2020-05-17 DIAGNOSIS — I2510 Atherosclerotic heart disease of native coronary artery without angina pectoris: Secondary | ICD-10-CM | POA: Diagnosis not present

## 2020-05-24 ENCOUNTER — Telehealth: Payer: Self-pay | Admitting: Internal Medicine

## 2020-05-24 NOTE — Telephone Encounter (Signed)
Received a new hem referral from [name] for erythrocytosis. [name] has been cld and scheduled to see [name] on 1/31 at 1145am w/labs at 11:15am. Pt aware to arrive 15 minutes early.

## 2020-05-25 ENCOUNTER — Other Ambulatory Visit: Payer: Self-pay | Admitting: Physician Assistant

## 2020-05-25 ENCOUNTER — Other Ambulatory Visit: Payer: Self-pay | Admitting: Cardiology

## 2020-05-25 DIAGNOSIS — D751 Secondary polycythemia: Secondary | ICD-10-CM

## 2020-05-28 ENCOUNTER — Inpatient Hospital Stay: Payer: Medicare PPO

## 2020-05-28 ENCOUNTER — Inpatient Hospital Stay: Payer: Medicare PPO | Attending: Internal Medicine | Admitting: Internal Medicine

## 2020-05-28 ENCOUNTER — Other Ambulatory Visit: Payer: Self-pay

## 2020-05-28 VITALS — BP 120/72 | HR 52 | Temp 97.7°F | Resp 15 | Ht 70.0 in | Wt 227.3 lb

## 2020-05-28 DIAGNOSIS — G4730 Sleep apnea, unspecified: Secondary | ICD-10-CM

## 2020-05-28 DIAGNOSIS — Z87891 Personal history of nicotine dependence: Secondary | ICD-10-CM

## 2020-05-28 DIAGNOSIS — D751 Secondary polycythemia: Secondary | ICD-10-CM | POA: Insufficient documentation

## 2020-05-28 DIAGNOSIS — J449 Chronic obstructive pulmonary disease, unspecified: Secondary | ICD-10-CM | POA: Diagnosis not present

## 2020-05-28 DIAGNOSIS — E039 Hypothyroidism, unspecified: Secondary | ICD-10-CM | POA: Diagnosis not present

## 2020-05-28 DIAGNOSIS — K219 Gastro-esophageal reflux disease without esophagitis: Secondary | ICD-10-CM

## 2020-05-28 DIAGNOSIS — I10 Essential (primary) hypertension: Secondary | ICD-10-CM | POA: Diagnosis not present

## 2020-05-28 DIAGNOSIS — Z801 Family history of malignant neoplasm of trachea, bronchus and lung: Secondary | ICD-10-CM

## 2020-05-28 DIAGNOSIS — D539 Nutritional anemia, unspecified: Secondary | ICD-10-CM | POA: Insufficient documentation

## 2020-05-28 DIAGNOSIS — K2270 Barrett's esophagus without dysplasia: Secondary | ICD-10-CM

## 2020-05-28 DIAGNOSIS — N400 Enlarged prostate without lower urinary tract symptoms: Secondary | ICD-10-CM | POA: Diagnosis not present

## 2020-05-28 DIAGNOSIS — Z806 Family history of leukemia: Secondary | ICD-10-CM

## 2020-05-28 DIAGNOSIS — E785 Hyperlipidemia, unspecified: Secondary | ICD-10-CM

## 2020-05-28 DIAGNOSIS — D7589 Other specified diseases of blood and blood-forming organs: Secondary | ICD-10-CM | POA: Diagnosis not present

## 2020-05-28 DIAGNOSIS — Z800 Family history of malignant neoplasm of digestive organs: Secondary | ICD-10-CM

## 2020-05-28 DIAGNOSIS — L409 Psoriasis, unspecified: Secondary | ICD-10-CM

## 2020-05-28 LAB — CBC WITH DIFFERENTIAL (CANCER CENTER ONLY)
Abs Immature Granulocytes: 0.03 10*3/uL (ref 0.00–0.07)
Basophils Absolute: 0.1 10*3/uL (ref 0.0–0.1)
Basophils Relative: 1 %
Eosinophils Absolute: 0.2 10*3/uL (ref 0.0–0.5)
Eosinophils Relative: 4 %
HCT: 48.6 % (ref 39.0–52.0)
Hemoglobin: 16.1 g/dL (ref 13.0–17.0)
Immature Granulocytes: 1 %
Lymphocytes Relative: 21 %
Lymphs Abs: 1.1 10*3/uL (ref 0.7–4.0)
MCH: 33.5 pg (ref 26.0–34.0)
MCHC: 33.1 g/dL (ref 30.0–36.0)
MCV: 101.3 fL — ABNORMAL HIGH (ref 80.0–100.0)
Monocytes Absolute: 0.4 10*3/uL (ref 0.1–1.0)
Monocytes Relative: 7 %
Neutro Abs: 3.5 10*3/uL (ref 1.7–7.7)
Neutrophils Relative %: 66 %
Platelet Count: 168 10*3/uL (ref 150–400)
RBC: 4.8 MIL/uL (ref 4.22–5.81)
RDW: 13.7 % (ref 11.5–15.5)
WBC Count: 5.3 10*3/uL (ref 4.0–10.5)
nRBC: 0 % (ref 0.0–0.2)

## 2020-05-28 LAB — CMP (CANCER CENTER ONLY)
ALT: 21 U/L (ref 0–44)
AST: 15 U/L (ref 15–41)
Albumin: 3.8 g/dL (ref 3.5–5.0)
Alkaline Phosphatase: 87 U/L (ref 38–126)
Anion gap: 6 (ref 5–15)
BUN: 7 mg/dL — ABNORMAL LOW (ref 8–23)
CO2: 28 mmol/L (ref 22–32)
Calcium: 9.2 mg/dL (ref 8.9–10.3)
Chloride: 108 mmol/L (ref 98–111)
Creatinine: 0.82 mg/dL (ref 0.61–1.24)
GFR, Estimated: >60 mL/min (ref >60)
Glucose, Bld: 104 mg/dL — ABNORMAL HIGH (ref 70–99)
Potassium: 3.9 mmol/L (ref 3.5–5.1)
Sodium: 142 mmol/L (ref 135–145)
Total Bilirubin: 0.7 mg/dL (ref 0.3–1.2)
Total Protein: 6.8 g/dL (ref 6.5–8.1)

## 2020-05-28 NOTE — Progress Notes (Signed)
[place] [phone]   [phone]  CONSULT NOTE  REFERRING PHYSICIAN: Dr. Deland Pretty  REASON FOR CONSULTATION:  80 years old white male with erythrocytosis  HPI [name] is a 80 y.o. male with past medical history significant for multiple medical problems including history of coronary artery disease, COPD, GERD, dyslipidemia, hypothyroidism, psoriasis, sleep apnea, benign prostatic hypertrophy, Barrett's esophagus.  The patient was seen recently by his primary care physician for routine evaluation and CBC on March 26, 2020 showed normal hemoglobin of 16.6 but the patient had elevated hematocrit of 50.1% with also high MCV of 101.8.  He was referred to me today for evaluation and recommendation regarding this abnormality. The patient is feeling fine today with no concerning complaints except for feeling tired at times.  He denied having any current chest pain but has shortness of breath secondary to COPD.  He intentionally lost around 52 pounds after starting treatment with Ozempic.  He denied having any nausea, vomiting, diarrhea or constipation.  He denied having any headache or visual changes.  He had a history of anemia and 2019 and his hemoglobin was in the range of 10. Family history Father deceased from suicide at age 25.  Mother deceased from COPD/pneumonia and she had dementia.  Maternal grandfather had leukemia and maternal grandmother had colon cancer. The patient is married and has two children.  He works as a Theme park manager.  He has a history for smoking for around 20 years but quit in 1986.  He drinks alcohol occasionally especially during the Sunday ceremony.  He has no history of drug abuse.    HPI  Past Medical History:  Diagnosis Date  . Allergic asthma   . Barrett's esophagus   . BPH (benign prostatic hyperplasia)   . CAD (coronary artery disease)   . COPD (chronic obstructive pulmonary disease) ([place])   . GERD  (gastroesophageal reflux disease)   . Hiatal hernia   . Hyperlipidemia   . Hypothyroidism   . Psoriasis   . Sleep apnea    wears CPAP nightly    Past Surgical History:  Procedure Laterality Date  . COLONOSCOPY  03/2017  . CORONARY ANGIOPLASTY WITH STENT PLACEMENT  08/15/14   PCI of pLAD Xience alpine DES  . EYE SURGERY  01/2017   eyelids lifted  . LEFT HEART CATHETERIZATION WITH CORONARY ANGIOGRAM N/A 08/15/2014   Procedure: LEFT HEART CATHETERIZATION WITH CORONARY ANGIOGRAM;  Surgeon: Peter M Martinique, MD;  Location: [facility] CATH LAB;  Service: Cardiovascular;  Laterality: N/A;  . left knee arthroscopy    . NASAL POLYP EXCISION  1953  . NASAL SINUS SURGERY    . TONSILLECTOMY    . TOTAL HIP ARTHROPLASTY Right 06/22/2017   Procedure: RIGHT TOTAL HIP ARTHROPLASTY ANTERIOR APPROACH;  Surgeon: Rod Can, MD;  Location: [place];  Service: Orthopedics;  Laterality: Right;  Needs RNFA  . TOTAL HIP REVISION Right 07/10/2017   Procedure: RIGHT TOTAL HIP REVISION FEMORAL COMPONENT;  Surgeon: Rod Can, MD;  Location: [place];  Service: Orthopedics;  Laterality: Right;  Needs RNFA    Family History  Problem Relation Age of Onset  . Lung cancer Father   . Dementia Mother   . COPD Mother     Social History Social History   Tobacco Use  . Smoking status: Former Smoker    Packs/day: 3.00    Years: 33.00    Pack years: 99.00    Types: Pipe, Cigars  Quit date: 04/28/1985    Years since quitting: 35.1  . Smokeless tobacco: Never Used  Vaping Use  . Vaping Use: Never used  Substance Use Topics  . Alcohol use: Yes    Alcohol/week: 2.0 standard drinks    Types: 1 Glasses of wine, 1 Shots of liquor per week  . Drug use: No    No Known Allergies  Current Outpatient Medications  Medication Sig Dispense Refill  . albuterol (PROVENTIL HFA;VENTOLIN HFA) 108 (90 BASE) MCG/ACT inhaler Inhale 1-2 puffs into the lungs every 6 (six) hours as needed for wheezing or shortness of breath. 1  Inhaler 11  . amLODipine (NORVASC) 5 MG tablet TAKE 1 TABLET BY MOUTH DAILY *NEED APPOINTMENT FOR REFILLS* 90 tablet 1  . aspirin EC 81 MG tablet Take 81 mg by mouth daily.    Marland Kitchen atorvastatin (LIPITOR) 40 MG tablet TAKE 1 TABLET BY MOUTH EVERY DAY IN THE EVENING 30 tablet 11  . Cholecalciferol (VITAMIN D3) 1000 UNITS CAPS Take 1,000 Units by mouth daily.    . Cyanocobalamin (VITAMIN B-12) 5000 MCG TBDP Take 5,000 mcg by mouth daily.     . cycloSPORINE (RESTASIS) 0.05 % ophthalmic emulsion Place 1 drop into both eyes 2 (two) times daily.      Marland Kitchen ezetimibe (ZETIA) 10 MG tablet Take 1 tablet (10 mg total) by mouth every evening. 90 tablet 1  . fexofenadine (ALLEGRA) 180 MG tablet Take 180 mg by mouth daily.    . fluticasone (CUTIVATE) 0.05 % cream Apply 1 application topically 2 (two) times daily.   2  . furosemide (LASIX) 20 MG tablet Take 2 tablets (40 mg total) by mouth daily. (Patient taking differently: Take 40 mg by mouth 2 (two) times daily. ) 180 tablet 3  . Glucosamine-Chondroit-Vit C-Mn (GLUCOSAMINE 1500 COMPLEX PO) Take 2 capsules by mouth daily.    Marland Kitchen HYDROcodone-acetaminophen (NORCO/VICODIN) 5-325 MG tablet Take 1 tablet by mouth 3 (three) times daily as needed.    Marland Kitchen KLOR-CON M10 10 MEQ tablet TAKE 1 TABLET BY MOUTH EVERY DAY 30 tablet 1  . Levothyroxine Sodium 150 MCG CAPS Take 150 mcg by mouth daily before breakfast.     . Melatonin 5 MG CAPS Take by mouth daily.    . Multiple Vitamins-Minerals (SENIOR MULTIVITAMIN PLUS PO) Take 1 tablet by mouth daily.     . naproxen sodium (ALEVE) 220 MG tablet Take 220 mg by mouth. 2 in the am; 2 in the pm    . nitroGLYCERIN (NITROLINGUAL) 0.4 MG/SPRAY spray Place 1 spray under the tongue every 5 (five) minutes x 3 doses as needed for chest pain. Use as directed as needed 12 g 12  . NON FORMULARY CPAP machine every night    . Omega-3 1000 MG CAPS Take 1000 mg by mouth once daily    . OVER THE COUNTER MEDICATION Take 1 tablet by mouth daily. Prostate  Plus Supplement - "Cranberry on Steroids"    . pantoprazole (PROTONIX) 40 MG tablet Take 30-60 min before first meal of the day (Patient taking differently: Take 40 mg by mouth daily before breakfast. )    . Semaglutide,0.25 or 0.5MG /DOS, (OZEMPIC, 0.25 OR 0.5 MG/DOSE,) 2 MG/1.5ML SOPN Ozempic 0.25 mg or 0.5 mg (2 mg/1.5 mL) subcutaneous pen injector    . vitamin C (ASCORBIC ACID) 500 MG tablet Take 1,000 mg by mouth daily.     No current facility-administered medications for this visit.    Review of Systems  Constitutional: positive for fatigue  Eyes: negative Ears, nose, mouth, throat, and face: negative Respiratory: positive for dyspnea on exertion Cardiovascular: negative Gastrointestinal: negative Genitourinary:negative Integument/breast: negative Hematologic/lymphatic: negative Musculoskeletal:negative Neurological: negative Behavioral/Psych: negative Endocrine: negative Allergic/Immunologic: negative  Physical Exam  [id number], healthy, no distress, well nourished and well developed SKIN: skin color, texture, turgor are normal, no rashes or significant lesions HEAD: Normocephalic, No masses, lesions, tenderness or abnormalities EYES: normal, PERRLA, Conjunctiva are pink and non-injected EARS: External ears normal, Canals clear OROPHARYNX:no exudate, no erythema and lips, buccal mucosa, and tongue normal  NECK: supple, no adenopathy, no JVD LYMPH:  no palpable lymphadenopathy, no hepatosplenomegaly LUNGS: clear to auscultation , and palpation HEART: regular rate & rhythm, no murmurs and no gallops ABDOMEN:abdomen soft, non-tender, normal bowel sounds and no masses or organomegaly BACK: No CVA tenderness, Range of motion is normal EXTREMITIES:no joint deformities, effusion, or inflammation, no edema  NEURO: alert & oriented x 3 with fluent speech, no focal motor/sensory deficits  PERFORMANCE STATUS: ECOG 1  LABORATORY DATA: Lab Results  Component Value Date   WBC 5.3  05/28/2020   HGB 16.1 05/28/2020   HCT 48.6 05/28/2020   MCV 101.3 (H) 05/28/2020   PLT 168 05/28/2020      Chemistry      Component Value Date/Time   NA 136 07/11/2017 0557   NA 141 12/09/2016 0848   K 4.3 07/11/2017 0557   CL 105 07/11/2017 0557   CO2 23 07/11/2017 0557   BUN 10 07/11/2017 0557   BUN 13 12/09/2016 0848   CREATININE 0.93 07/11/2017 0557   CREATININE 0.83 12/28/2014 1518      Component Value Date/Time   CALCIUM 8.4 (L) 07/11/2017 0557   ALKPHOS 102 06/29/2017 1842   AST 22 06/29/2017 1842   ALT 21 06/29/2017 1842   BILITOT 1.1 06/29/2017 1842       RADIOGRAPHIC STUDIES: No results found.  ASSESSMENT: This is a very pleasant 80 years old white male who presented for evaluation of erythrocytosis seen on blood work by his primary care physician during annual physical examination in November 2021.  The patient has a history of anemia in 2019 which recovered completely at this point.   PLAN: I had a lengthy discussion with the patient today about his condition.  I repeated CBC today which showed normal hemoglobin and hematocrit but a slightly elevated MCV consistent with mild microcytosis. Comprehensive metabolic panel is unremarkable. I explained to the patient that he has normal blood count and there is no need for significant additional investigation at this point. I recommended for him to continue on observation with repeat CBC, iron study, ferritin, vitamin B12 and serum folate in 6 months. If the patient has any worsening of his erythrocytosis or suspicious polycythemia, I will check JAK2 mutation and other additional studies. He was also advised to cut down on any iron supplements at this point. The patient agreed to the current plan.  He was advised to call immediately if he has any other concerning symptoms in the interval. The patient voices understanding of current disease status and treatment options and is in agreement with the current care  plan.  All questions were answered. The patient knows to call the clinic with any problems, questions or concerns. We can certainly see the patient much sooner if necessary.  Thank you so much for allowing me to participate in the care of [name]. I will continue to follow up the patient with you and assist in his care.  The  total time spent in the appointment was 60 minutes.  Disclaimer: This note was dictated with voice recognition software. Similar sounding words can inadvertently be transcribed and may not be corrected upon review.   Eilleen Kempf May 28, 2020, 11:55 AM

## 2020-05-30 ENCOUNTER — Telehealth: Payer: Self-pay | Admitting: Internal Medicine

## 2020-05-30 NOTE — Telephone Encounter (Signed)
Scheduled appts per 1/31 los. Pt did not have a voicemail. Mailed appt reminder and calendar.

## 2020-06-18 DIAGNOSIS — G4733 Obstructive sleep apnea (adult) (pediatric): Secondary | ICD-10-CM | POA: Diagnosis not present

## 2020-06-21 ENCOUNTER — Other Ambulatory Visit: Payer: Medicare PPO

## 2020-06-23 ENCOUNTER — Other Ambulatory Visit: Payer: Self-pay | Admitting: Cardiology

## 2020-06-25 ENCOUNTER — Other Ambulatory Visit: Payer: Medicare PPO

## 2020-06-27 DIAGNOSIS — L400 Psoriasis vulgaris: Secondary | ICD-10-CM | POA: Diagnosis not present

## 2020-06-28 ENCOUNTER — Ambulatory Visit: Payer: Medicare PPO | Admitting: Cardiology

## 2020-06-28 ENCOUNTER — Encounter: Payer: Self-pay | Admitting: Cardiology

## 2020-06-28 ENCOUNTER — Other Ambulatory Visit: Payer: Self-pay

## 2020-06-28 VITALS — BP 121/73 | HR 92 | Temp 98.2°F | Resp 17 | Ht 70.0 in | Wt 226.2 lb

## 2020-06-28 DIAGNOSIS — E7800 Pure hypercholesterolemia, unspecified: Secondary | ICD-10-CM | POA: Diagnosis not present

## 2020-06-28 DIAGNOSIS — I10 Essential (primary) hypertension: Secondary | ICD-10-CM | POA: Diagnosis not present

## 2020-06-28 DIAGNOSIS — I2510 Atherosclerotic heart disease of native coronary artery without angina pectoris: Secondary | ICD-10-CM

## 2020-06-28 DIAGNOSIS — R0600 Dyspnea, unspecified: Secondary | ICD-10-CM

## 2020-06-28 DIAGNOSIS — R0609 Other forms of dyspnea: Secondary | ICD-10-CM | POA: Diagnosis not present

## 2020-06-28 DIAGNOSIS — I453 Trifascicular block: Secondary | ICD-10-CM | POA: Diagnosis not present

## 2020-06-28 MED ORDER — EZETIMIBE 10 MG PO TABS: 10.0000 mg | ORAL_TABLET | Freq: Every evening | ORAL | 3 refills | Status: DC

## 2020-06-28 NOTE — Progress Notes (Signed)
[facility]  Primary Physician/Referring:  Deland Pretty, MD  Patient ID: [name], male    DOB: 1940/12/13, 80 y.o.   MRN: [id number]  Chief Complaint  Patient presents with  . Coronary Artery Disease  . Hypertension  . trifascicular block  . Follow-up    3 month   HPI:    HPI: [name]  is a 80 y.o. Caucasian male with past medical history of venous insufficiency with prior venous stasis ulcer, OSA on CPAP, hypertension, hypercholesterolemia, CAD S/P MI and stent to LAD in 1987 and again in 1997, repeat stenting to LAD stenosis with DES in April 2016.   Patient presents for 22-monthfollow-up of PVCs and hyperlipidemia.  Overall patient is presently doing well without specific complaints today.  He has had no recurrence of anginal symptoms and has not needed sublingual nitroglycerin.  He continues to lose weight since starting Ozempic a total of approximately 50 pounds.  He has resumed taking Lipitor, and is tolerating this well.  He remains compliant with CPAP.  Patient denies angina, dyspnea, leg swelling, palpitations, dizziness, syncope, near syncope.  Patient denies orthopnea, PND.  He does continue to suffer from chronic back pain.  Notably patient does report that Ozempic causes him constipation, therefore patient stops Ozempic for 1 week approximately every 6 weeks until his movements have regulated, then he resumes Ozempic.   Past Medical History:  Diagnosis Date  . Allergic asthma   . Barrett's esophagus   . BPH (benign prostatic hyperplasia)   . CAD (coronary artery disease)   . [place]   . GERD (gastroesophageal reflux disease)   . Hiatal hernia   . Hyperlipidemia   . Hypothyroidism   . Psoriasis   . Sleep apnea    wears CPAP nightly   Past Surgical History:  Procedure Laterality Date  . COLONOSCOPY  03/2017  . CORONARY ANGIOPLASTY WITH STENT PLACEMENT  08/15/14   PCI of pLAD Xience alpine DES  . EYE SURGERY   01/2017   eyelids lifted  . LEFT HEART CATHETERIZATION WITH CORONARY ANGIOGRAM N/A 08/15/2014   Procedure: LEFT HEART CATHETERIZATION WITH CORONARY ANGIOGRAM;  Surgeon: Peter M JMartinique MD;  [facility];  Service: Cardiovascular;  Laterality: N/A;  . left knee arthroscopy    . NASAL POLYP EXCISION  1953  . NASAL SINUS SURGERY    . TONSILLECTOMY    . TOTAL HIP ARTHROPLASTY Right 06/22/2017   Procedure: RIGHT TOTAL HIP ARTHROPLASTY ANTERIOR APPROACH;  Surgeon: SRod Can MD;  [place] [facility]  Service: Orthopedics;  Laterality: Right;  Needs RNFA  . TOTAL HIP REVISION Right 07/10/2017   Procedure: RIGHT TOTAL HIP REVISION FEMORAL COMPONENT;  Surgeon: SRod Can MD;  [place] [facility]  Service: Orthopedics;  Laterality: Right;  Needs RNFA    Social History   Tobacco Use  . Smoking status: Former Smoker    Packs/day: 3.00    Years: 33.00    Pack years: 99.00    Types: Pipe, Cigars    Quit date: 04/28/1985    Years since quitting: 35.1  . Smokeless tobacco: Never Used  Substance Use Topics  . Alcohol use: Yes    Alcohol/week: 2.0 standard drinks    Types: 1 Glasses of wine, 1 Shots of liquor per week    Comment: OCCASIONAL   Marital Status: Married  ROS  Review of Systems  Constitutional: Negative for malaise/fatigue and weight gain.  Cardiovascular: Negative for chest pain,  claudication, dyspnea on exertion, leg swelling, near-syncope, orthopnea, palpitations, paroxysmal nocturnal dyspnea and syncope.  Respiratory: Negative for shortness of breath.   Hematologic/Lymphatic: Does not bruise/bleed easily.  Musculoskeletal: Positive for arthritis and back pain.  Gastrointestinal: Negative for melena.  Neurological: Negative for dizziness and weakness.   Objective   Vitals with BMI 06/28/2020 05/28/2020 03/29/2020  Height 5' 10"  5' 10"  5' 10"   Weight 226 lbs 3 oz 227 lbs 5 oz 235 lbs  BMI 32.46 40.98 11.91  Systolic 478 295 621  Diastolic 73 72 77  Pulse 92 52 70    Blood pressure 121/73, pulse 92, temperature 98.2 F (36.8 C), temperature source Temporal, resp. rate 17, height 5' 10"  (1.778 m), weight 226 lb 3.2 oz (102.6 kg), SpO2 98 %.     Physical Exam Vitals reviewed.  Constitutional:      Appearance: He is well-developed. He is obese.     Comments: Well built   Cardiovascular:     Rate and Rhythm: Normal rate and regular rhythm. Frequent extrasystoles are present.    Pulses: Intact distal pulses.          Carotid pulses are 2+ on the right side and 2+ on the left side.      Dorsalis pedis pulses are 2+ on the right side and 2+ on the left side.       Posterior tibial pulses are 2+ on the right side and 2+ on the left side.     Heart sounds: S1 normal and S2 normal.     Comments: Femoral pulse and popliteal pulse feeble (Body habitus). Chronic venostasis dermatitis noted.  Pulmonary:     Effort: Pulmonary effort is normal. No accessory muscle usage or respiratory distress.     Breath sounds: Normal breath sounds.  Musculoskeletal:        General: Normal range of motion.     Cervical back: Normal range of motion.     Right lower leg: No edema.     Left lower leg: No edema.  Skin:    General: Skin is warm and dry.  Neurological:     General: No focal deficit present.     Mental Status: He is oriented to person, place, and time.     Laboratory examination:   Recent Labs    05/28/20 1115  NA 142  K 3.9  CL 108  CO2 28  GLUCOSE 104*  BUN 7*  CREATININE 0.82  CALCIUM 9.2  GFRNONAA >60   CMP Latest Ref Rng & Units 05/28/2020 07/11/2017 06/29/2017  Glucose 70 - 99 mg/dL 104(H) 196(H) 104(H)  BUN 8 - 23 mg/dL 7(L) 10 17  Creatinine 0.61 - 1.24 mg/dL 0.82 0.93 0.79  Sodium 135 - 145 mmol/L 142 136 137  Potassium 3.5 - 5.1 mmol/L 3.9 4.3 4.1  Chloride 98 - 111 mmol/L 108 105 101  CO2 22 - 32 mmol/L 28 23 28   Calcium 8.9 - 10.3 mg/dL 9.2 8.4(L) 8.7(L)  Total Protein 6.5 - 8.1 g/dL 6.8 - 6.5  Total Bilirubin 0.3 - 1.2 mg/dL 0.7 -  1.1  Alkaline Phos 38 - 126 U/L 87 - 102  AST 15 - 41 U/L 15 - 22  ALT 0 - 44 U/L 21 - 21   CBC Latest Ref Rng & Units 05/28/2020 07/13/2017 07/12/2017  WBC 4.0 - 10.5 K/uL 5.3 7.1 9.8  Hemoglobin 13.0 - 17.0 g/dL 16.1 10.8(L) 10.0(L)  Hematocrit 39.0 - 52.0 % 48.6 34.4(L) 31.2(L)  Platelets 150 -  400 K/uL 168 207 212   External labs:  Cholesterol, total 109.000 m 05/17/2020 HDL 34.000 mg 05/17/2020 LDL 60.000 mg 05/17/2020 Triglycerides 73.000 mg 05/17/2020  A1C 5.500 12/21/2019  Cholesterol, total 115.000 m 11/10/2019 HDL 37.000 mg 03/26/2020 LDL-C 109.000 ca 03/26/2020 Triglycerides 112.000 m 03/26/2020  A1C 5.500 12/21/2019 TSH 0.745 03/10/2019  Creatinine, Serum 0.910 MG/ 11/10/2019  ALT (SGPT) 37.000 IU/ 03/10/2019   Labs 03/10/2019:  CBC normal, BUN 17, creatinine 1.0, EGFR greater than 60 ml, CMP otherwise normal, potassium 4.6. A1c 6.8%.  Total cholesterol 175, triglycerides 144, HDL 33, LDL 116.  PSA normal, TSH normal.  Medications   Current Outpatient Medications on File Prior to Visit  Medication Sig Dispense Refill  . albuterol (PROVENTIL HFA;VENTOLIN HFA) 108 (90 BASE) MCG/ACT inhaler Inhale 1-2 puffs into the lungs every 6 (six) hours as needed for wheezing or shortness of breath. 1 Inhaler 11  . aspirin EC 81 MG tablet Take 81 mg by mouth daily.    Marland Kitchen atorvastatin (LIPITOR) 40 MG tablet TAKE 1 TABLET BY MOUTH EVERY DAY IN THE EVENING 30 tablet 11  . Cholecalciferol (VITAMIN D3) 1000 UNITS CAPS Take 1,000 Units by mouth daily.    . Cyanocobalamin (VITAMIN B-12) 5000 MCG TBDP Take 5,000 mcg by mouth daily.     . cycloSPORINE (RESTASIS) 0.05 % ophthalmic emulsion Place 1 drop into both eyes 2 (two) times daily.    . fexofenadine (ALLEGRA) 180 MG tablet Take 180 mg by mouth daily.    . fluticasone (CUTIVATE) 0.05 % cream Apply 1 application topically 2 (two) times daily.   2  . Glucosamine-Chondroit-Vit C-Mn (GLUCOSAMINE 1500 COMPLEX PO) Take 2 capsules by mouth  daily.    Marland Kitchen HYDROcodone-acetaminophen (NORCO/VICODIN) 5-325 MG tablet Take 1 tablet by mouth 3 (three) times daily as needed.    Marland Kitchen KLOR-CON M10 10 MEQ tablet TAKE 1 TABLET BY MOUTH EVERY DAY 30 tablet 0  . Levothyroxine Sodium 150 MCG CAPS Take 150 mcg by mouth daily before breakfast.     . Magnesium 250 MG TABS Take 1 tablet by mouth daily.    . Melatonin 5 MG CAPS Take by mouth daily.    . Multiple Vitamins-Minerals (SENIOR MULTIVITAMIN PLUS PO) Take 1 tablet by mouth daily.     . naproxen sodium (ALEVE) 220 MG tablet Take 220 mg by mouth. 2 in the am; 2 in the pm    . nitroGLYCERIN (NITROLINGUAL) 0.4 MG/SPRAY spray Place 1 spray under the tongue every 5 (five) minutes x 3 doses as needed for chest pain. Use as directed as needed 12 g 12  . NON FORMULARY CPAP machine every night    . Omega-3 1000 MG CAPS Take 1000 mg by mouth once daily    . OVER THE COUNTER MEDICATION Take 1 tablet by mouth daily. Prostate Plus Supplement - "Cranberry on Steroids"    . pantoprazole (PROTONIX) 40 MG tablet Take 30-60 min before first meal of the day (Patient taking differently: Take 40 mg by mouth daily before breakfast.)    . Semaglutide,0.25 or 0.5MG/DOS, (OZEMPIC, 0.25 OR 0.5 MG/DOSE,) 2 MG/1.5ML SOPN Ozempic 0.25 mg or 0.5 mg (2 mg/1.5 mL) subcutaneous pen injector    . vitamin C (ASCORBIC ACID) 500 MG tablet Take 1,000 mg by mouth daily.     No current facility-administered medications on file prior to visit.     Cardiac Studies:   Echo 09/30/2016: Normal LV systolic function, EF 40-98% with mild diffuse hypokinesis.  Grade 1  diastolic dysfunction.  Moderate left atrial enlargement, mitral annular calcification.  PCV MYOCARDIAL PERFUSION WITH LEXISCAN 04/16/2020 Lexiscan nuclear stress test performed using 1-day protocol. Stress EKG is non-diagnostic, as this is pharmacological stress test. In addition, stress EKG showed sinus rhythm, RBBB, LAFB, frequent PVC's and brief run of atrial tachycardia, no  ischemic changes. Normal myocardial perfusion. Stress LVEF 29%. High risk study due to low stress LVEF.  Repeat echocardiogram pending - scheduled for 07/03/2020  EKG  EKG 03/29/2020: Sinus rhythm with first-degree block at rate of 96 bpm, left atrial enlargement, left axis deviation, left intrafascicular block.  Right bundle branch block.  Trifascicular block.  Frequent PVCs in a quadrigeminy pattern.  Compared to 06/17/2018, frequent PVCs new.  Assessment     ICD-10-CM   1. Atherosclerosis of native coronary artery of native heart without angina pectoris  I25.10   2. Dyspnea on exertion  R06.00   3. Trifascicular block  I45.3   4. Primary hypertension  I10   5. Pure hypercholesterolemia  E78.00    Meds ordered this encounter  Medications  . ezetimibe (ZETIA) 10 MG tablet    Sig: Take 1 tablet (10 mg total) by mouth every evening.    Dispense:  90 tablet    Refill:  3   Medications Discontinued During This Encounter  Medication Reason  . Glucosamine 500 MG CAPS   . ezetimibe (ZETIA) 10 MG tablet Reorder  No orders of the defined types were placed in this encounter.   Recommendations:   Mr. Aarav Burgett is a 80 y.o. Caucasian male with past medical history of venous insufficiency with prior venous stasis ulcer, OSA on CPAP, hypertension, GERD, morbid obesity, trifascicular block, hypercholesterolemia and CAD S/P MI and stent to LAD in 1987 and again in 1997, repeat stenting to LAD stenosis with DES in April 2016 presents for annual visit. He has not had recurrence of angina.  Patient presents for 58-monthfollow-up of hyperlipidemia and PVCs.  Patient is presently on Lipitor and Zetia, which she is tolerating well.  I have personally reviewed external labs, lipids are under excellent control.  On physical exam, patient continues to have frequent PVCs, although he remains asymptomatic.  There are no clinical signs of heart failure.  Discussed and reviewed with patient results of  nuclear stress test, details above, revealing normal myocardial perfusion with low stress LVEF.  Patient is currently scheduled for echocardiogram in our office next week.  We will further evaluate significance of low stress LVEF InContext when echocardiogram results are available, I have advised patient of this.   Patient's blood pressure remains well controlled.  He continues to lose weight with Ozempic, approximately 50 pounds since starting the medication.  Weight loss has significantly improved patient's overall wellbeing.  Advised patient to discuss constipation add Ozempic with PCP, he verbalized agreement.  Notably patient does have underlying trifascicular block, however no indication for pacemaker at this time as he remains asymptomatic.  Follow-up in 1 year, sooner if needed, for CAD and hyperlipidemia unless significant abnormalities noted on pending echocardiogram.   CAlethia Berthold PA-C 06/28/2020, 2:15 PM Office: [phone]

## 2020-07-03 ENCOUNTER — Ambulatory Visit: Payer: Medicare PPO

## 2020-07-03 ENCOUNTER — Other Ambulatory Visit: Payer: Self-pay

## 2020-07-03 DIAGNOSIS — R0609 Other forms of dyspnea: Secondary | ICD-10-CM

## 2020-07-03 DIAGNOSIS — I2510 Atherosclerotic heart disease of native coronary artery without angina pectoris: Secondary | ICD-10-CM | POA: Diagnosis not present

## 2020-07-03 DIAGNOSIS — R0600 Dyspnea, unspecified: Secondary | ICD-10-CM

## 2020-07-09 NOTE — Progress Notes (Signed)
Essentially normal echo

## 2020-07-17 DIAGNOSIS — I10 Essential (primary) hypertension: Secondary | ICD-10-CM | POA: Diagnosis not present

## 2020-07-17 DIAGNOSIS — I2510 Atherosclerotic heart disease of native coronary artery without angina pectoris: Secondary | ICD-10-CM | POA: Diagnosis not present

## 2020-07-17 DIAGNOSIS — E7800 Pure hypercholesterolemia, unspecified: Secondary | ICD-10-CM | POA: Diagnosis not present

## 2020-07-17 DIAGNOSIS — E118 Type 2 diabetes mellitus with unspecified complications: Secondary | ICD-10-CM | POA: Diagnosis not present

## 2020-07-18 DIAGNOSIS — G4733 Obstructive sleep apnea (adult) (pediatric): Secondary | ICD-10-CM | POA: Diagnosis not present

## 2020-07-20 ENCOUNTER — Other Ambulatory Visit: Payer: Self-pay | Admitting: Cardiology

## 2020-08-21 ENCOUNTER — Other Ambulatory Visit: Payer: Self-pay | Admitting: Cardiology

## 2020-09-04 ENCOUNTER — Other Ambulatory Visit: Payer: Self-pay | Admitting: Cardiology

## 2020-10-12 DIAGNOSIS — M7061 Trochanteric bursitis, right hip: Secondary | ICD-10-CM | POA: Diagnosis not present

## 2020-10-30 DIAGNOSIS — M25551 Pain in right hip: Secondary | ICD-10-CM | POA: Diagnosis not present

## 2020-10-30 DIAGNOSIS — M6281 Muscle weakness (generalized): Secondary | ICD-10-CM | POA: Diagnosis not present

## 2020-11-05 DIAGNOSIS — M6281 Muscle weakness (generalized): Secondary | ICD-10-CM | POA: Diagnosis not present

## 2020-11-05 DIAGNOSIS — M25551 Pain in right hip: Secondary | ICD-10-CM | POA: Diagnosis not present

## 2020-11-07 DIAGNOSIS — M6281 Muscle weakness (generalized): Secondary | ICD-10-CM | POA: Diagnosis not present

## 2020-11-07 DIAGNOSIS — M25551 Pain in right hip: Secondary | ICD-10-CM | POA: Diagnosis not present

## 2020-11-12 DIAGNOSIS — M25551 Pain in right hip: Secondary | ICD-10-CM | POA: Diagnosis not present

## 2020-11-12 DIAGNOSIS — M6281 Muscle weakness (generalized): Secondary | ICD-10-CM | POA: Diagnosis not present

## 2020-11-15 DIAGNOSIS — I10 Essential (primary) hypertension: Secondary | ICD-10-CM | POA: Diagnosis not present

## 2020-11-15 DIAGNOSIS — E7800 Pure hypercholesterolemia, unspecified: Secondary | ICD-10-CM | POA: Diagnosis not present

## 2020-11-15 DIAGNOSIS — M6281 Muscle weakness (generalized): Secondary | ICD-10-CM | POA: Diagnosis not present

## 2020-11-15 DIAGNOSIS — E118 Type 2 diabetes mellitus with unspecified complications: Secondary | ICD-10-CM | POA: Diagnosis not present

## 2020-11-15 DIAGNOSIS — I2510 Atherosclerotic heart disease of native coronary artery without angina pectoris: Secondary | ICD-10-CM | POA: Diagnosis not present

## 2020-11-15 DIAGNOSIS — M25551 Pain in right hip: Secondary | ICD-10-CM | POA: Diagnosis not present

## 2020-11-15 DIAGNOSIS — E039 Hypothyroidism, unspecified: Secondary | ICD-10-CM | POA: Diagnosis not present

## 2020-11-20 DIAGNOSIS — M25551 Pain in right hip: Secondary | ICD-10-CM | POA: Diagnosis not present

## 2020-11-20 DIAGNOSIS — M6281 Muscle weakness (generalized): Secondary | ICD-10-CM | POA: Diagnosis not present

## 2020-11-27 ENCOUNTER — Inpatient Hospital Stay: Payer: Medicare PPO | Attending: Internal Medicine | Admitting: Internal Medicine

## 2020-11-27 ENCOUNTER — Inpatient Hospital Stay: Payer: Medicare PPO

## 2020-11-29 DIAGNOSIS — M6281 Muscle weakness (generalized): Secondary | ICD-10-CM | POA: Diagnosis not present

## 2020-11-29 DIAGNOSIS — M25551 Pain in right hip: Secondary | ICD-10-CM | POA: Diagnosis not present

## 2020-12-06 DIAGNOSIS — K5903 Drug induced constipation: Secondary | ICD-10-CM | POA: Diagnosis not present

## 2020-12-06 DIAGNOSIS — E039 Hypothyroidism, unspecified: Secondary | ICD-10-CM | POA: Diagnosis not present

## 2020-12-13 DIAGNOSIS — M6281 Muscle weakness (generalized): Secondary | ICD-10-CM | POA: Diagnosis not present

## 2020-12-13 DIAGNOSIS — M25551 Pain in right hip: Secondary | ICD-10-CM | POA: Diagnosis not present

## 2020-12-17 DIAGNOSIS — M6281 Muscle weakness (generalized): Secondary | ICD-10-CM | POA: Diagnosis not present

## 2020-12-17 DIAGNOSIS — M25551 Pain in right hip: Secondary | ICD-10-CM | POA: Diagnosis not present

## 2020-12-19 DIAGNOSIS — M25551 Pain in right hip: Secondary | ICD-10-CM | POA: Diagnosis not present

## 2020-12-19 DIAGNOSIS — M6281 Muscle weakness (generalized): Secondary | ICD-10-CM | POA: Diagnosis not present

## 2020-12-21 DIAGNOSIS — G4733 Obstructive sleep apnea (adult) (pediatric): Secondary | ICD-10-CM | POA: Diagnosis not present

## 2021-01-07 DIAGNOSIS — Z23 Encounter for immunization: Secondary | ICD-10-CM | POA: Diagnosis not present

## 2021-01-18 DIAGNOSIS — K117 Disturbances of salivary secretion: Secondary | ICD-10-CM | POA: Diagnosis not present

## 2021-01-29 ENCOUNTER — Emergency Department (HOSPITAL_COMMUNITY): Payer: Medicare PPO

## 2021-01-29 ENCOUNTER — Encounter (HOSPITAL_COMMUNITY): Payer: Self-pay | Admitting: *Deleted

## 2021-01-29 ENCOUNTER — Other Ambulatory Visit: Payer: Self-pay

## 2021-01-29 ENCOUNTER — Emergency Department (HOSPITAL_COMMUNITY)
Admission: EM | Admit: 2021-01-29 | Discharge: 2021-01-29 | Disposition: A | Discharge status: Home / Self Care | Payer: Medicare PPO | Attending: Emergency Medicine | Admitting: Emergency Medicine

## 2021-01-29 DIAGNOSIS — Z794 Long term (current) use of insulin: Secondary | ICD-10-CM | POA: Insufficient documentation

## 2021-01-29 DIAGNOSIS — Z96641 Presence of right artificial hip joint: Secondary | ICD-10-CM | POA: Insufficient documentation

## 2021-01-29 DIAGNOSIS — Z7982 Long term (current) use of aspirin: Secondary | ICD-10-CM | POA: Insufficient documentation

## 2021-01-29 DIAGNOSIS — Z20822 Contact with and (suspected) exposure to covid-19: Secondary | ICD-10-CM | POA: Diagnosis not present

## 2021-01-29 DIAGNOSIS — Z79899 Other long term (current) drug therapy: Secondary | ICD-10-CM | POA: Insufficient documentation

## 2021-01-29 DIAGNOSIS — I10 Essential (primary) hypertension: Secondary | ICD-10-CM | POA: Insufficient documentation

## 2021-01-29 DIAGNOSIS — J45909 Unspecified asthma, uncomplicated: Secondary | ICD-10-CM | POA: Diagnosis not present

## 2021-01-29 DIAGNOSIS — R4182 Altered mental status, unspecified: Secondary | ICD-10-CM | POA: Insufficient documentation

## 2021-01-29 DIAGNOSIS — R0789 Other chest pain: Secondary | ICD-10-CM | POA: Insufficient documentation

## 2021-01-29 DIAGNOSIS — Z87891 Personal history of nicotine dependence: Secondary | ICD-10-CM | POA: Insufficient documentation

## 2021-01-29 DIAGNOSIS — R4789 Other speech disturbances: Secondary | ICD-10-CM | POA: Insufficient documentation

## 2021-01-29 DIAGNOSIS — R1030 Lower abdominal pain, unspecified: Secondary | ICD-10-CM | POA: Insufficient documentation

## 2021-01-29 DIAGNOSIS — R079 Chest pain, unspecified: Secondary | ICD-10-CM | POA: Diagnosis not present

## 2021-01-29 DIAGNOSIS — Z955 Presence of coronary angioplasty implant and graft: Secondary | ICD-10-CM | POA: Diagnosis not present

## 2021-01-29 DIAGNOSIS — J449 Chronic obstructive pulmonary disease, unspecified: Secondary | ICD-10-CM | POA: Diagnosis not present

## 2021-01-29 DIAGNOSIS — E039 Hypothyroidism, unspecified: Secondary | ICD-10-CM | POA: Diagnosis not present

## 2021-01-29 DIAGNOSIS — I2510 Atherosclerotic heart disease of native coronary artery without angina pectoris: Secondary | ICD-10-CM | POA: Insufficient documentation

## 2021-01-29 DIAGNOSIS — G319 Degenerative disease of nervous system, unspecified: Secondary | ICD-10-CM | POA: Diagnosis not present

## 2021-01-29 DIAGNOSIS — R001 Bradycardia, unspecified: Secondary | ICD-10-CM | POA: Diagnosis not present

## 2021-01-29 LAB — CBC WITH DIFFERENTIAL/PLATELET
Abs Immature Granulocytes: 0.02 10*3/uL (ref 0.00–0.07)
Basophils Absolute: 0 10*3/uL (ref 0.0–0.1)
Basophils Relative: 1 %
Eosinophils Absolute: 0.2 10*3/uL (ref 0.0–0.5)
Eosinophils Relative: 6 %
HCT: 43.5 % (ref 39.0–52.0)
Hemoglobin: 15.3 g/dL (ref 13.0–17.0)
Immature Granulocytes: 1 %
Lymphocytes Relative: 26 %
Lymphs Abs: 1 10*3/uL (ref 0.7–4.0)
MCH: 35.3 pg — ABNORMAL HIGH (ref 26.0–34.0)
MCHC: 35.2 g/dL (ref 30.0–36.0)
MCV: 100.5 fL — ABNORMAL HIGH (ref 80.0–100.0)
Monocytes Absolute: 0.4 10*3/uL (ref 0.1–1.0)
Monocytes Relative: 10 %
Neutro Abs: 2.3 10*3/uL (ref 1.7–7.7)
Neutrophils Relative %: 56 %
Platelets: 190 10*3/uL (ref 150–400)
RBC: 4.33 MIL/uL (ref 4.22–5.81)
RDW: 13.2 % (ref 11.5–15.5)
WBC: 4 10*3/uL (ref 4.0–10.5)
nRBC: 0 % (ref 0.0–0.2)

## 2021-01-29 LAB — RAPID URINE DRUG SCREEN, HOSP PERFORMED
Amphetamines: NOT DETECTED
Barbiturates: NOT DETECTED
Benzodiazepines: NOT DETECTED
Cocaine: NOT DETECTED
Opiates: NOT DETECTED
Tetrahydrocannabinol: NOT DETECTED

## 2021-01-29 LAB — URINALYSIS, ROUTINE W REFLEX MICROSCOPIC
Bilirubin Urine: NEGATIVE
Glucose, UA: NEGATIVE mg/dL
Hgb urine dipstick: NEGATIVE
Ketones, ur: 20 mg/dL — AB
Leukocytes,Ua: NEGATIVE
Nitrite: NEGATIVE
Protein, ur: NEGATIVE mg/dL
Specific Gravity, Urine: 1.017 (ref 1.005–1.030)
pH: 6 (ref 5.0–8.0)

## 2021-01-29 LAB — COMPREHENSIVE METABOLIC PANEL
ALT: 43 U/L (ref 0–44)
AST: 39 U/L (ref 15–41)
Albumin: 3.8 g/dL (ref 3.5–5.0)
Alkaline Phosphatase: 69 U/L (ref 38–126)
Anion gap: 9 (ref 5–15)
BUN: 9 mg/dL (ref 8–23)
CO2: 27 mmol/L (ref 22–32)
Calcium: 9.3 mg/dL (ref 8.9–10.3)
Chloride: 103 mmol/L (ref 98–111)
Creatinine, Ser: 0.85 mg/dL (ref 0.61–1.24)
GFR, Estimated: >60 mL/min (ref >60)
Glucose, Bld: 103 mg/dL — ABNORMAL HIGH (ref 70–99)
Potassium: 3.4 mmol/L — ABNORMAL LOW (ref 3.5–5.1)
Sodium: 139 mmol/L (ref 135–145)
Total Bilirubin: 1.1 mg/dL (ref 0.3–1.2)
Total Protein: 6.4 g/dL — ABNORMAL LOW (ref 6.5–8.1)

## 2021-01-29 LAB — TROPONIN I (HIGH SENSITIVITY)
Troponin I (High Sensitivity): 8 ng/L (ref <18)
Troponin I (High Sensitivity): 8 ng/L (ref <18)
Troponin I (High Sensitivity): 5 ng/L (ref <18)
Troponin I (High Sensitivity): 6 ng/L (ref <18)

## 2021-01-29 LAB — RESP PANEL BY RT-PCR (FLU A&B, COVID) ARPGX2
Influenza A by PCR: NEGATIVE
Influenza B by PCR: NEGATIVE
SARS Coronavirus 2 by RT PCR: NEGATIVE

## 2021-01-29 LAB — AMMONIA: Ammonia: 18 umol/L (ref 9–35)

## 2021-01-29 LAB — TSH: TSH: 0.023 u[IU]/mL — ABNORMAL LOW (ref 0.350–4.500)

## 2021-01-29 LAB — ETHANOL: Alcohol, Ethyl (B): <10 mg/dL (ref <10)

## 2021-01-29 MED ORDER — POTASSIUM CHLORIDE CRYS ER 20 MEQ PO TBCR
40.0000 meq | EXTENDED_RELEASE_TABLET | Freq: Once | ORAL | Status: AC
  Administered 2021-01-29: 40 meq via ORAL
  Filled 2021-01-29: qty 2

## 2021-01-29 MED ORDER — LORAZEPAM 2 MG/ML IJ SOLN
0.5000 mg | Freq: Once | INTRAMUSCULAR | Status: AC
  Administered 2021-01-29: 0.5 mg via INTRAVENOUS
  Filled 2021-01-29: qty 1

## 2021-01-29 NOTE — Discharge Instructions (Addendum)
Today you were evaluated for new confusion with slurred speech and chest discomfort.  We completed multiple tests to rule out cardiac emergencies including EKG which has not changed from your previous readings, and troponin levels which evaluate heart muscle damage and were normal.  We also obtained multiple images of your brain to rule out intracranial abnormalities, which showed no acute intracranial process.  Your urine was normal and not concerning for infection.  Your ammonia level, which if elevated can contribute to confusion, was normal.  You were noted to have a low TSH level which we suggest you address with your primary care physician.  I recommend that you return to ED for reevaluation if you have recurrent, persistent chest pain, worsening confusion, or fainting that is associated with weakness.  Please follow-up with your primary care provider within the next 1 week for hospital follow-up.

## 2021-01-29 NOTE — ED Notes (Signed)
Bladder scan - 14 mL

## 2021-01-29 NOTE — ED Triage Notes (Addendum)
Pt arrived with [facility] reporting difficulty doing normal tasks, such as balancing his checkbook. 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 No neuro deficits, ambulatory   EMS VS pulse 78, spo2%98, 18 g L AC

## 2021-01-29 NOTE — ED Provider Notes (Signed)
Emergency Medicine Provider Triage Evaluation Note  [name] , a 80 y.o. male  was evaluated in triage.  Pt complains of chest pain and depression.  States that he has not been taking care of himself over the past several years.  Reports that he had some "angina" earlier today.  States that he has not been doing well in his job.  That he has needed time off.  Sounds depressed.  Review of Systems  Positive: Chest pain, depression Negative: Fever, chills, SOB  Physical Exam  BP (!) 142/79 (BP Location: Right Arm)   Pulse 81   Temp 98.2 F (36.8 C) (Oral)   Resp 15   SpO2 98%  Gen:   Awake, no distress   Resp:  Normal effort  MSK:   Moves extremities without difficulty  Other:    Medical Decision Making  Medically screening exam initiated at 2:54 AM.  Appropriate orders placed.  [name] was informed that the remainder of the evaluation will be completed by another provider, this initial triage assessment does not replace that evaluation, and the importance of remaining in the ED until their evaluation is complete.  Chest pain, depression   [name], Hershal Coria 01/29/21 0255    Palumbo, April, MD 01/29/21 [phone]

## 2021-01-29 NOTE — ED Provider Notes (Signed)
[facility] EMERGENCY DEPARTMENT Provider Note   CSN: [id number] Arrival date & time: 01/29/21  0247     History No chief complaint on file.   [name] is a 80 y.o. male with pertinent past medical history of MI x3 with PCI and DES, hyperlipidemia, prediabetes, hypothyroidism who presents with a complaint of persistent confusion over the last several weeks to months.  The patient's daughter is present and states that she has noted over the past few months the patient has seemed more confused, citing instances where he missed turns while driving routes that he knows and near miss accidents and parking lots.  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  According to his daughter, over the last several days his speech has significantly slowed with difficulties with words reported.  Recently he had an episode where he got up to walk to the restroom and felt woozy resulting in him sitting down.  He does say that he was afraid he was having a stroke at that time.  On initial presentation, patient did have chest discomfort however does not voice this complaint at this time.  His current concerns are constipation with recent passage of large, dark stool, as well as feelings of increased fullness and discomfort in the suprapubic area.  He does say that he experiences sensation of incomplete bladder emptying and dysuria at times.     Past Medical History:  Diagnosis Date   Allergic asthma    Barrett's esophagus    BPH (benign prostatic hyperplasia)    CAD (coronary artery disease)    COPD (chronic obstructive pulmonary disease) (HCC)    GERD  (gastroesophageal reflux disease)    Hiatal hernia    Hyperlipidemia    Hypothyroidism    Psoriasis    Sleep apnea    wears CPAP nightly    Patient Active Problem List   Diagnosis Date Noted   Erythrocytosis 05/28/2020   Macrocytosis without anemia 05/28/2020   DNR (do not resuscitate) 03/30/2019   Failed total hip arthroplasty, initial encounter ([place]) 07/10/2017   Primary osteoarthritis of right hip 06/22/2017   Osteoarthritis of right hip 06/22/2017   Pre-operative clearance 05/19/2017   Weight gain 03/30/2015   Constipation 12/28/2014   Bilateral lower extremity edema 12/28/2014   RBBB 08/14/2014   Psoriasis 08/14/2014   Chest pain with high risk of acute coronary syndrome 08/14/2014   Essential hypertension 08/14/2014   Unstable angina ([place]) 08/14/2014   Hoarseness 06/15/2014   Rash and nonspecific skin eruption 06/15/2014   Submandibular swelling 05/15/2013   Asthma with acute exacerbation 08/18/2012   Eustachian tube dysfunction 04/05/2012   Allergic rhinitis due to pollen 06/28/2010   ABDOMINAL BRUIT 03/04/2010   Dyslipidemia 03/07/2009   CAD S/P PCI LAD 1987, `1997, 08/15/14 PCI pLAD DES 03/07/2009   BPH (benign prostatic hyperplasia) 03/07/2009   Hypothyroidism 03/06/2009   Allergic-infective asthma 04/06/2007   Centrilobular emphysema ([place]) 04/06/2007    Past Surgical History:  Procedure Laterality Date   COLONOSCOPY  03/2017   CORONARY ANGIOPLASTY WITH STENT PLACEMENT  08/15/14   PCI of pLAD Xience alpine DES   EYE SURGERY  01/2017  eyelids lifted   LEFT HEART CATHETERIZATION WITH CORONARY ANGIOGRAM N/A 08/15/2014   Procedure: LEFT HEART CATHETERIZATION WITH CORONARY ANGIOGRAM;  Surgeon: Peter M Martinique, MD;  Location: [facility] CATH LAB;  Service: Cardiovascular;  Laterality: N/A;   left knee arthroscopy     NASAL POLYP EXCISION  1953   NASAL SINUS SURGERY     TONSILLECTOMY     TOTAL HIP ARTHROPLASTY Right 06/22/2017   Procedure: RIGHT TOTAL HIP ARTHROPLASTY  ANTERIOR APPROACH;  Surgeon: Rod Can, MD;  Location: [facility] [facility];  Service: Orthopedics;  Laterality: Right;  Needs RNFA   TOTAL HIP REVISION Right 07/10/2017   Procedure: RIGHT TOTAL HIP REVISION FEMORAL COMPONENT;  Surgeon: Rod Can, MD;  Location: [place];  Service: Orthopedics;  Laterality: Right;  Needs RNFA       Family History  Problem Relation Age of Onset   Lung cancer Father    Dementia Mother    COPD Mother     Social History   Tobacco Use   Smoking status: Former    Packs/day: 3.00    Years: 33.00    Pack years: 99.00    Types: Pipe, Cigars, Cigarettes    Quit date: 04/28/1985    Years since quitting: 35.7   Smokeless tobacco: Never  Vaping Use   Vaping Use: Never used  Substance Use Topics   Alcohol use: Yes    Alcohol/week: 2.0 standard drinks    Types: 1 Glasses of wine, 1 Shots of liquor per week    Comment: OCCASIONAL   Drug use: No    Home Medications Prior to Admission medications   Medication Sig Start Date End Date Taking? Authorizing Provider  albuterol (PROVENTIL HFA;VENTOLIN HFA) 108 (90 BASE) MCG/ACT inhaler Inhale 1-2 puffs into the lungs every 6 (six) hours as needed for wheezing or shortness of breath. 09/26/14   Baird Lyons D, MD  aspirin EC 81 MG tablet Take 81 mg by mouth daily.    [provider]  atorvastatin (LIPITOR) 40 MG tablet TAKE 1 TABLET BY MOUTH EVERY DAY IN THE EVENING 02/13/20   Adrian Prows, MD  Cholecalciferol (VITAMIN D3) 1000 UNITS CAPS Take 1,000 Units by mouth daily.    [provider]  Cyanocobalamin (VITAMIN B-12) 5000 MCG TBDP Take 5,000 mcg by mouth daily.     [provider]  cycloSPORINE (RESTASIS) 0.05 % ophthalmic emulsion Place 1 drop into both eyes 2 (two) times daily.    [provider]  ezetimibe (ZETIA) 10 MG tablet Take 1 tablet (10 mg total) by mouth every evening. 06/28/20   Cantwell, Celeste C, PA-C  fexofenadine (ALLEGRA) 180 MG tablet Take 180 mg by mouth daily.     [provider]  fluticasone (CUTIVATE) 0.05 % cream Apply 1 application topically 2 (two) times daily.  07/21/14   [provider]  Glucosamine-Chondroit-Vit C-Mn (GLUCOSAMINE 1500 COMPLEX PO) Take 2 capsules by mouth daily.    [provider]  HYDROcodone-acetaminophen (NORCO/VICODIN) 5-325 MG tablet Take 1 tablet by mouth 3 (three) times daily as needed. 02/22/20   [provider]  KLOR-CON M10 10 MEQ tablet TAKE 1 TABLET (10 MEQ TOTAL) BY MOUTH DAILY. PLEASE CALL TO SCHEDULE APPOINTMENT FOR FUTURE REFILLS. 09/04/20   Lelon Perla, MD  Levothyroxine Sodium 150 MCG CAPS Take 150 mcg by mouth daily before breakfast.     [provider]  Magnesium 250 MG TABS Take 1 tablet by mouth daily.    [provider]  Melatonin 5  MG CAPS Take by mouth daily.    [provider]  Multiple Vitamins-Minerals (SENIOR MULTIVITAMIN PLUS PO) Take 1 tablet by mouth daily.     [provider]  naproxen sodium (ALEVE) 220 MG tablet Take 220 mg by mouth. 2 in the am; 2 in the pm    [provider]  nitroGLYCERIN (NITROLINGUAL) 0.4 MG/SPRAY spray Place 1 spray under the tongue every 5 (five) minutes x 3 doses as needed for chest pain. Use as directed as needed 05/24/13   Lelon Perla, MD  NON FORMULARY CPAP machine every night    [provider]  Omega-3 1000 MG CAPS Take 1000 mg by mouth once daily    [provider]  OVER THE COUNTER MEDICATION Take 1 tablet by mouth daily. Prostate Plus Supplement - "Cranberry on Steroids"    [provider]  pantoprazole (PROTONIX) 40 MG tablet Take 30-60 min before first meal of the day Patient taking differently: Take 40 mg by mouth daily before breakfast. 03/17/12   Tanda Rockers, MD  Semaglutide,0.25 or 0.5MG /DOS, (OZEMPIC, 0.25 OR 0.5 MG/DOSE,) 2 MG/1.5ML SOPN Ozempic 0.25 mg or 0.5 mg (2 mg/1.5 mL) subcutaneous pen injector    [provider]  vitamin C  (ASCORBIC ACID) 500 MG tablet Take 1,000 mg by mouth daily.    [provider]    Allergies    Patient has no known allergies.  Review of Systems   Review of Systems  Constitutional:  Positive for fatigue and unexpected weight change (Approximately 60 pounds over the last year since starting Ozempic). Negative for activity change.  Respiratory:  Positive for chest tightness. Negative for shortness of breath.   Cardiovascular:  Positive for leg swelling. Negative for chest pain.  Gastrointestinal:  Positive for abdominal distention, constipation and diarrhea.  Endocrine: Positive for cold intolerance and polydipsia. Negative for polyuria.  Genitourinary:  Positive for difficulty urinating and dysuria.  Neurological:  Positive for dizziness, weakness and light-headedness. Negative for syncope.  Psychiatric/Behavioral:  The patient is nervous/anxious.    Physical Exam Updated Vital Signs BP 126/77   Pulse 88   Temp 98.3 F (36.8 C) (Oral)   Resp 18   SpO2 98%   Constitutional: Early gentleman lying in bed comfortably.  No acute distress noted. Cardio: Regular rate and rhythm.  No murmurs, rubs, gallops.  No JVD noted. Pulm: Clear to auscultation bilaterally.  Normal work of breathing on room air. Abdomen: Soft, suprapubic tenderness and distention noted.  Otherwise negative for tenderness to palpation or distention. MSK: Normal bulk and tone. Skin: Skin is warm and dry with the exception of bilateral feet which are cool.  Bilateral 1+ pitting edema of ankles. Neuro: Alert and oriented to self, year, month, location.  No focal deficit noted. Psych: Patient appears anxious with tearful mood.  ED Results / Procedures / Treatments   Labs (all labs ordered are listed, but only abnormal results are displayed) Labs Reviewed  COMPREHENSIVE METABOLIC PANEL - Abnormal; Notable for the following components:      Result Value   Potassium 3.4 (*)    Glucose, Bld 103 (*)    Total  Protein 6.4 (*)    All other components within normal limits  CBC WITH DIFFERENTIAL/PLATELET - Abnormal; Notable for the following components:   MCV 100.5 (*)    MCH 35.3 (*)    All other components within normal limits  URINALYSIS, ROUTINE W REFLEX MICROSCOPIC - Abnormal; Notable for the following components:  Ketones, ur 20 (*)    All other components within normal limits  TSH - Abnormal; Notable for the following components:   TSH 0.023 (*)    All other components within normal limits  RESP PANEL BY RT-PCR (FLU A&B, COVID) ARPGX2  ETHANOL  RAPID URINE DRUG SCREEN, HOSP PERFORMED  AMMONIA  TROPONIN I (HIGH SENSITIVITY)  TROPONIN I (HIGH SENSITIVITY)  TROPONIN I (HIGH SENSITIVITY)  TROPONIN I (HIGH SENSITIVITY)    EKG EKG Interpretation  Date/Time:  Tuesday January 29 2021 15:20:08 EDT Ventricular Rate:  81 PR Interval:  245 QRS Duration: 160 QT Interval:  399 QTC Calculation: 464 R Axis:   10 Text Interpretation: Sinus rhythm Ventricular premature complex Prolonged PR interval Right bundle branch block When compared to prior, similar appearance. No STEMI Confirmed by Antony Blackbird [id number]) on 01/29/2021 3:32:44 PM  Radiology DG Chest 2 View  Result Date: 01/29/2021 CLINICAL DATA:  Chest pain EXAM: CHEST - 2 VIEW COMPARISON:  06/29/2017 FINDINGS: Cardiac shadow is stable. Aortic calcifications are noted. The lungs are clear. Mild degenerative changes of the thoracic spine are noted. IMPRESSION: No active cardiopulmonary disease. Electronically Signed   By: Inez Catalina M.D.   On: 01/29/2021 03:54   MR BRAIN WO CONTRAST  Result Date: 01/29/2021 CLINICAL DATA:  Mental status change, unknown cause. EXAM: MRI HEAD WITHOUT CONTRAST TECHNIQUE: Multiplanar, multiecho pulse sequences of the brain and surrounding structures were obtained without intravenous contrast. COMPARISON:  None. FINDINGS: Brain: There is no evidence of an acute infarct, intracranial hemorrhage, mass, midline  shift, or extra-axial fluid collection. T2 hyperintensities in the cerebral white matter nonspecific but compatible with mildly age advanced chronic small vessel ischemic disease. There is mild generalized cerebral atrophy. Vascular: Major intracranial vascular flow voids are preserved. Skull and upper cervical spine: Unremarkable bone marrow signal. Sinuses/Orbits: Unremarkable orbits. Clear paranasal sinuses. Trace left mastoid effusion. Other: None. IMPRESSION: 1. No acute intracranial abnormality. 2. Mild chronic small vessel ischemic disease and cerebral atrophy. Electronically Signed   By: Logan Bores M.D.   On: 01/29/2021 18:54    Procedures None  Medications Ordered in ED Medications  potassium chloride SA (KLOR-CON) CR tablet 40 mEq (40 mEq Oral Given 01/29/21 1716)  LORazepam (ATIVAN) injection 0.5 mg (0.5 mg Intravenous Given 01/29/21 1800)    ED Course  I have reviewed the triage vital signs and the nursing notes.  Pertinent labs & imaging results that were available during my care of the patient were reviewed by me and considered in my medical decision making (see chart for details).  Clinical Course as of 01/29/21 2138  Tue Jan 29, 2021  1907 Ammonia: 18 [ED]  1907 Ammonia [ED]    Clinical Course User Index [ED] Farrel Gordon, DO   MDM Rules/Calculators/A&P                           Mr. Grisso is an 80 year old gentleman with past medical history of MI x3 with PCI and DES, hyperlipidemia, prediabetes, hypothyroidism who initially presented with complaints of new confusion, difficulty with completion of ADLs, chest pain.  On physical exam, patient and his daughter who is at bedside do endorse slowed mentation and speech slowing with difficulty with words over the last several months, with notable worsening of speech difficulties over the last several days.  Given patient's history of hypothyroidism, TSH was checked and was low at 0.023. Ammonia WNL at 15.  UA was collected due to  infection concern contributing to confusion combined with patient's complaints of suprapubic fullness and tenderness to palpation and was not remarkable for infection.  MRI of the brain was ordered and showed no acute intracranial process with mild chronic small vessel ischemic disease with cerebral atrophy.   Patient is also complained of chest discomfort since prior to presentation to the ED.  EKG was done and was relatively unchanged from previous tracings.  Troponin on presentation as well as repeat troponin were both negative.   Final Clinical Impression(s) / ED Diagnoses Final diagnoses:  Chest discomfort  Altered mental status, unspecified altered mental status type    Rx / DC Orders ED Discharge Orders     None        Farrel Gordon, DO 01/29/21 2139    Tegeler, Gwenyth Allegra, MD 01/30/21 [id number]

## 2021-02-04 DIAGNOSIS — Z79899 Other long term (current) drug therapy: Secondary | ICD-10-CM | POA: Diagnosis not present

## 2021-02-04 DIAGNOSIS — E876 Hypokalemia: Secondary | ICD-10-CM | POA: Diagnosis not present

## 2021-02-04 DIAGNOSIS — I2510 Atherosclerotic heart disease of native coronary artery without angina pectoris: Secondary | ICD-10-CM | POA: Diagnosis not present

## 2021-02-04 DIAGNOSIS — I10 Essential (primary) hypertension: Secondary | ICD-10-CM | POA: Diagnosis not present

## 2021-02-04 DIAGNOSIS — E118 Type 2 diabetes mellitus with unspecified complications: Secondary | ICD-10-CM | POA: Diagnosis not present

## 2021-02-04 DIAGNOSIS — I499 Cardiac arrhythmia, unspecified: Secondary | ICD-10-CM | POA: Diagnosis not present

## 2021-02-11 DIAGNOSIS — F411 Generalized anxiety disorder: Secondary | ICD-10-CM | POA: Diagnosis not present

## 2021-02-11 DIAGNOSIS — E039 Hypothyroidism, unspecified: Secondary | ICD-10-CM | POA: Diagnosis not present

## 2021-02-11 DIAGNOSIS — L304 Erythema intertrigo: Secondary | ICD-10-CM | POA: Diagnosis not present

## 2021-02-11 DIAGNOSIS — I2510 Atherosclerotic heart disease of native coronary artery without angina pectoris: Secondary | ICD-10-CM | POA: Diagnosis not present

## 2021-02-11 DIAGNOSIS — I10 Essential (primary) hypertension: Secondary | ICD-10-CM | POA: Diagnosis not present

## 2021-02-11 DIAGNOSIS — J449 Chronic obstructive pulmonary disease, unspecified: Secondary | ICD-10-CM | POA: Diagnosis not present

## 2021-02-11 DIAGNOSIS — M159 Polyosteoarthritis, unspecified: Secondary | ICD-10-CM | POA: Diagnosis not present

## 2021-02-11 DIAGNOSIS — G4733 Obstructive sleep apnea (adult) (pediatric): Secondary | ICD-10-CM | POA: Diagnosis not present

## 2021-02-11 DIAGNOSIS — E119 Type 2 diabetes mellitus without complications: Secondary | ICD-10-CM | POA: Diagnosis not present

## 2021-02-15 DIAGNOSIS — I2510 Atherosclerotic heart disease of native coronary artery without angina pectoris: Secondary | ICD-10-CM | POA: Diagnosis not present

## 2021-02-15 DIAGNOSIS — J449 Chronic obstructive pulmonary disease, unspecified: Secondary | ICD-10-CM | POA: Diagnosis not present

## 2021-02-15 DIAGNOSIS — M159 Polyosteoarthritis, unspecified: Secondary | ICD-10-CM | POA: Diagnosis not present

## 2021-02-15 DIAGNOSIS — E039 Hypothyroidism, unspecified: Secondary | ICD-10-CM | POA: Diagnosis not present

## 2021-02-15 DIAGNOSIS — G4733 Obstructive sleep apnea (adult) (pediatric): Secondary | ICD-10-CM | POA: Diagnosis not present

## 2021-02-15 DIAGNOSIS — K625 Hemorrhage of anus and rectum: Secondary | ICD-10-CM | POA: Diagnosis not present

## 2021-02-15 DIAGNOSIS — L304 Erythema intertrigo: Secondary | ICD-10-CM | POA: Diagnosis not present

## 2021-02-15 DIAGNOSIS — E119 Type 2 diabetes mellitus without complications: Secondary | ICD-10-CM | POA: Diagnosis not present

## 2021-02-15 DIAGNOSIS — I10 Essential (primary) hypertension: Secondary | ICD-10-CM | POA: Diagnosis not present

## 2021-02-18 DIAGNOSIS — K5900 Constipation, unspecified: Secondary | ICD-10-CM | POA: Diagnosis not present

## 2021-02-18 DIAGNOSIS — K2270 Barrett's esophagus without dysplasia: Secondary | ICD-10-CM | POA: Diagnosis not present

## 2021-02-18 DIAGNOSIS — F5101 Primary insomnia: Secondary | ICD-10-CM | POA: Diagnosis not present

## 2021-02-18 DIAGNOSIS — Z79899 Other long term (current) drug therapy: Secondary | ICD-10-CM | POA: Diagnosis not present

## 2021-02-19 ENCOUNTER — Encounter (HOSPITAL_COMMUNITY): Payer: Self-pay

## 2021-02-19 ENCOUNTER — Other Ambulatory Visit: Payer: Self-pay

## 2021-02-19 ENCOUNTER — Emergency Department (HOSPITAL_COMMUNITY)
Admission: EM | Admit: 2021-02-19 | Discharge: 2021-02-22 | Disposition: A | Discharge status: Other Acute Inpatient Hospital | Payer: Medicare PPO | Attending: Emergency Medicine | Admitting: Emergency Medicine

## 2021-02-19 DIAGNOSIS — Z79899 Other long term (current) drug therapy: Secondary | ICD-10-CM | POA: Insufficient documentation

## 2021-02-19 DIAGNOSIS — R45851 Suicidal ideations: Secondary | ICD-10-CM | POA: Diagnosis not present

## 2021-02-19 DIAGNOSIS — Z87891 Personal history of nicotine dependence: Secondary | ICD-10-CM | POA: Insufficient documentation

## 2021-02-19 DIAGNOSIS — F418 Other specified anxiety disorders: Secondary | ICD-10-CM | POA: Insufficient documentation

## 2021-02-19 DIAGNOSIS — E039 Hypothyroidism, unspecified: Secondary | ICD-10-CM | POA: Insufficient documentation

## 2021-02-19 DIAGNOSIS — I2510 Atherosclerotic heart disease of native coronary artery without angina pectoris: Secondary | ICD-10-CM | POA: Insufficient documentation

## 2021-02-19 DIAGNOSIS — J45909 Unspecified asthma, uncomplicated: Secondary | ICD-10-CM | POA: Diagnosis not present

## 2021-02-19 DIAGNOSIS — Z96641 Presence of right artificial hip joint: Secondary | ICD-10-CM | POA: Insufficient documentation

## 2021-02-19 DIAGNOSIS — Z7952 Long term (current) use of systemic steroids: Secondary | ICD-10-CM | POA: Diagnosis not present

## 2021-02-19 DIAGNOSIS — Z7982 Long term (current) use of aspirin: Secondary | ICD-10-CM | POA: Insufficient documentation

## 2021-02-19 DIAGNOSIS — I10 Essential (primary) hypertension: Secondary | ICD-10-CM | POA: Diagnosis not present

## 2021-02-19 DIAGNOSIS — Y900 Blood alcohol level of less than 20 mg/100 ml: Secondary | ICD-10-CM | POA: Diagnosis not present

## 2021-02-19 DIAGNOSIS — Z20822 Contact with and (suspected) exposure to covid-19: Secondary | ICD-10-CM | POA: Diagnosis not present

## 2021-02-19 DIAGNOSIS — J449 Chronic obstructive pulmonary disease, unspecified: Secondary | ICD-10-CM | POA: Insufficient documentation

## 2021-02-19 DIAGNOSIS — R4589 Other symptoms and signs involving emotional state: Secondary | ICD-10-CM

## 2021-02-19 DIAGNOSIS — F32A Depression, unspecified: Secondary | ICD-10-CM | POA: Diagnosis not present

## 2021-02-19 LAB — COMPREHENSIVE METABOLIC PANEL
ALT: 31 U/L (ref 0–44)
AST: 24 U/L (ref 15–41)
Albumin: 3.9 g/dL (ref 3.5–5.0)
Alkaline Phosphatase: 72 U/L (ref 38–126)
Anion gap: 8 (ref 5–15)
BUN: 9 mg/dL (ref 8–23)
CO2: 25 mmol/L (ref 22–32)
Calcium: 9.2 mg/dL (ref 8.9–10.3)
Chloride: 106 mmol/L (ref 98–111)
Creatinine, Ser: 0.89 mg/dL (ref 0.61–1.24)
GFR, Estimated: >60 mL/min (ref >60)
Glucose, Bld: 110 mg/dL — ABNORMAL HIGH (ref 70–99)
Potassium: 3.9 mmol/L (ref 3.5–5.1)
Sodium: 139 mmol/L (ref 135–145)
Total Bilirubin: 1.5 mg/dL — ABNORMAL HIGH (ref 0.3–1.2)
Total Protein: 6.3 g/dL — ABNORMAL LOW (ref 6.5–8.1)

## 2021-02-19 LAB — CBC
HCT: 44.2 % (ref 39.0–52.0)
Hemoglobin: 15 g/dL (ref 13.0–17.0)
MCH: 35 pg — ABNORMAL HIGH (ref 26.0–34.0)
MCHC: 33.9 g/dL (ref 30.0–36.0)
MCV: 103 fL — ABNORMAL HIGH (ref 80.0–100.0)
Platelets: 162 10*3/uL (ref 150–400)
RBC: 4.29 MIL/uL (ref 4.22–5.81)
RDW: 14.1 % (ref 11.5–15.5)
WBC: 4.6 10*3/uL (ref 4.0–10.5)
nRBC: 0 % (ref 0.0–0.2)

## 2021-02-19 LAB — RAPID URINE DRUG SCREEN, HOSP PERFORMED
Amphetamines: NOT DETECTED
Barbiturates: NOT DETECTED
Benzodiazepines: POSITIVE — AB
Cocaine: NOT DETECTED
Opiates: NOT DETECTED
Tetrahydrocannabinol: NOT DETECTED

## 2021-02-19 LAB — ACETAMINOPHEN LEVEL: Acetaminophen (Tylenol), Serum: <10 ug/mL — ABNORMAL LOW (ref 10–30)

## 2021-02-19 LAB — ETHANOL: Alcohol, Ethyl (B): <10 mg/dL (ref <10)

## 2021-02-19 LAB — SALICYLATE LEVEL: Salicylate Lvl: <7 mg/dL — ABNORMAL LOW (ref 7.0–30.0)

## 2021-02-19 NOTE — ED Triage Notes (Signed)
Patient here with increased panic attacks and states that he is bipolar and not taking meds. Has also had constipation since starting new medication. Patient reports that he has had SI thoughts intermittently. Denies physical pain.  Patient states that he feels helpless

## 2021-02-19 NOTE — ED Provider Notes (Signed)
[facility] EMERGENCY DEPARTMENT Provider Note   CSN: [id number] Arrival date & time: 02/19/21  1228     History No chief complaint on file.   [name] is a 80 y.o. male who presents with depression and anxiety for several weeks.  Patient states that he has been having increasing feelings of hopelessness, and suicidal ideation last night.  He had a plan to hurt himself with his kitchen knives last night.  States that he was recently prescribed trazodone for insomnia and panic attacks, he was reading the adverse effects and was worried that it would put him into a "bipolar episode".  No personal history of bipolar disorder, but states this runs in his family.  Also states that he lost his father to suicide, and that if he continues to have the thoughts that he does that he may follow the same path.  Today patient has passive suicidal ideation, no active plan.  He also endorses paranoia, with no specific worries at this time.  No homicidal ideation, visual or auditory hallucinations.  Denies smoking, alcohol use, or other drug use.  HPI     Past Medical History:  Diagnosis Date   Allergic asthma    Barrett's esophagus    BPH (benign prostatic hyperplasia)    CAD (coronary artery disease)    COPD (chronic obstructive pulmonary disease) (HCC)    GERD (gastroesophageal reflux disease)    Hiatal hernia    Hyperlipidemia    Hypothyroidism    Psoriasis    Sleep apnea    wears CPAP nightly    Patient Active Problem List   Diagnosis Date Noted   Erythrocytosis 05/28/2020   Macrocytosis without anemia 05/28/2020   DNR (do not resuscitate) 03/30/2019   Failed total hip arthroplasty, initial encounter ([place]) 07/10/2017   Primary osteoarthritis of right hip 06/22/2017   Osteoarthritis of right hip 06/22/2017   Pre-operative clearance 05/19/2017   Weight gain 03/30/2015   Constipation 12/28/2014   Bilateral lower extremity edema 12/28/2014   RBBB 08/14/2014    Psoriasis 08/14/2014   Chest pain with high risk of acute coronary syndrome 08/14/2014   Essential hypertension 08/14/2014   Unstable angina ([place]) 08/14/2014   Hoarseness 06/15/2014   Rash and nonspecific skin eruption 06/15/2014   Submandibular swelling 05/15/2013   Asthma with acute exacerbation 08/18/2012   Eustachian tube dysfunction 04/05/2012   Allergic rhinitis due to pollen 06/28/2010   ABDOMINAL BRUIT 03/04/2010   Dyslipidemia 03/07/2009   CAD S/P PCI LAD 1987, `1997, 08/15/14 PCI pLAD DES 03/07/2009   BPH (benign prostatic hyperplasia) 03/07/2009   Hypothyroidism 03/06/2009   Allergic-infective asthma 04/06/2007   Centrilobular emphysema ([place]) 04/06/2007    Past Surgical History:  Procedure Laterality Date   COLONOSCOPY  03/2017   CORONARY ANGIOPLASTY WITH STENT PLACEMENT  08/15/14   PCI of pLAD Xience alpine DES   EYE SURGERY  01/2017   eyelids lifted   LEFT HEART CATHETERIZATION WITH CORONARY ANGIOGRAM N/A 08/15/2014   Procedure: LEFT HEART CATHETERIZATION WITH CORONARY ANGIOGRAM;  Surgeon: Peter M Martinique, MD;  Location: [facility] CATH LAB;  Service: Cardiovascular;  Laterality: N/A;   left knee arthroscopy     NASAL POLYP EXCISION  1953   NASAL SINUS SURGERY     TONSILLECTOMY     TOTAL HIP ARTHROPLASTY Right 06/22/2017   Procedure: RIGHT TOTAL HIP ARTHROPLASTY ANTERIOR APPROACH;  Surgeon: Rod Can, MD;  Location: [facility];  Service: Orthopedics;  Laterality: Right;  Needs RNFA  TOTAL HIP REVISION Right 07/10/2017   Procedure: RIGHT TOTAL HIP REVISION FEMORAL COMPONENT;  Surgeon: Rod Can, MD;  Location: [facility];  Service: Orthopedics;  Laterality: Right;  Needs RNFA       Family History  Problem Relation Age of Onset   Lung cancer Father    Dementia Mother    COPD Mother     Social History   Tobacco Use   Smoking status: Former    Packs/day: 3.00    Years: 33.00    Pack years: 99.00    Types: Pipe, Cigars, Cigarettes    Quit date: 04/28/1985    Years  since quitting: 35.8   Smokeless tobacco: Never  Vaping Use   Vaping Use: Never used  Substance Use Topics   Alcohol use: Yes    Alcohol/week: 2.0 standard drinks    Types: 1 Glasses of wine, 1 Shots of liquor per week    Comment: OCCASIONAL   Drug use: No    Home Medications Prior to Admission medications   Medication Sig Start Date End Date Taking? Authorizing Provider  albuterol (PROVENTIL HFA;VENTOLIN HFA) 108 (90 BASE) MCG/ACT inhaler Inhale 1-2 puffs into the lungs every 6 (six) hours as needed for wheezing or shortness of breath. 09/26/14   Baird Lyons D, MD  aspirin EC 81 MG tablet Take 81 mg by mouth daily.    [provider]  atorvastatin (LIPITOR) 40 MG tablet TAKE 1 TABLET BY MOUTH EVERY DAY IN THE EVENING 02/13/20   Adrian Prows, MD  Cholecalciferol (VITAMIN D3) 1000 UNITS CAPS Take 1,000 Units by mouth daily.    [provider]  Cyanocobalamin (VITAMIN B-12) 5000 MCG TBDP Take 5,000 mcg by mouth daily.     [provider]  cycloSPORINE (RESTASIS) 0.05 % ophthalmic emulsion Place 1 drop into both eyes 2 (two) times daily.    [provider]  ezetimibe (ZETIA) 10 MG tablet Take 1 tablet (10 mg total) by mouth every evening. 06/28/20   Cantwell, Celeste C, PA-C  fexofenadine (ALLEGRA) 180 MG tablet Take 180 mg by mouth daily.    [provider]  fluticasone (CUTIVATE) 0.05 % cream Apply 1 application topically 2 (two) times daily.  07/21/14   [provider]  Glucosamine-Chondroit-Vit C-Mn (GLUCOSAMINE 1500 COMPLEX PO) Take 2 capsules by mouth daily.    [provider]  HYDROcodone-acetaminophen (NORCO/VICODIN) 5-325 MG tablet Take 1 tablet by mouth 3 (three) times daily as needed. 02/22/20   [provider]  KLOR-CON M10 10 MEQ tablet TAKE 1 TABLET (10 MEQ TOTAL) BY MOUTH DAILY. PLEASE CALL TO SCHEDULE APPOINTMENT FOR FUTURE REFILLS. 09/04/20   Lelon Perla, MD  Levothyroxine Sodium 150 MCG CAPS Take 150  mcg by mouth daily before breakfast.     [provider]  Magnesium 250 MG TABS Take 1 tablet by mouth daily.    [provider]  Melatonin 5 MG CAPS Take by mouth daily.    [provider]  Multiple Vitamins-Minerals (SENIOR MULTIVITAMIN PLUS PO) Take 1 tablet by mouth daily.     [provider]  naproxen sodium (ALEVE) 220 MG tablet Take 220 mg by mouth. 2 in the am; 2 in the pm    [provider]  nitroGLYCERIN (NITROLINGUAL) 0.4 MG/SPRAY spray Place 1 spray under the tongue every 5 (five) minutes x 3 doses as needed for chest pain. Use as directed as needed 05/24/13   Lelon Perla, MD  NON FORMULARY CPAP machine every  night    [provider]  Omega-3 1000 MG CAPS Take 1000 mg by mouth once daily    [provider]  OVER THE COUNTER MEDICATION Take 1 tablet by mouth daily. Prostate Plus Supplement - "Cranberry on Steroids"    [provider]  pantoprazole (PROTONIX) 40 MG tablet Take 30-60 min before first meal of the day Patient taking differently: Take 40 mg by mouth daily before breakfast. 03/17/12   Tanda Rockers, MD  Semaglutide,0.25 or 0.5MG /DOS, (OZEMPIC, 0.25 OR 0.5 MG/DOSE,) 2 MG/1.5ML SOPN Ozempic 0.25 mg or 0.5 mg (2 mg/1.5 mL) subcutaneous pen injector    [provider]  vitamin C (ASCORBIC ACID) 500 MG tablet Take 1,000 mg by mouth daily.    [provider]    Allergies    Patient has no known allergies.  Review of Systems   Review of Systems  Psychiatric/Behavioral:  Positive for sleep disturbance and suicidal ideas. Negative for hallucinations. The patient is nervous/anxious.   All other systems reviewed and are negative.  Physical Exam Updated Vital Signs BP 130/71 (BP Location: Left Arm)   Pulse 83   Temp 98 F (36.7 C) (Oral)   Resp 17   SpO2 99%   Physical Exam Vitals and nursing note reviewed.  Constitutional:      Appearance: Normal appearance.  HENT:      Head: Normocephalic and atraumatic.  Eyes:     Conjunctiva/sclera: Conjunctivae normal.  Pulmonary:     Effort: Pulmonary effort is normal. No respiratory distress.  Skin:    General: Skin is warm and dry.  Neurological:     Mental Status: He is alert.  Psychiatric:        Attention and Perception: Attention normal. He does not perceive auditory or visual hallucinations.        Mood and Affect: Mood is depressed.        Speech: Speech normal.        Behavior: Behavior normal.        Thought Content: Thought content is paranoid. Thought content includes suicidal ideation. Thought content does not include homicidal ideation. Thought content does not include homicidal or suicidal plan.    ED Results / Procedures / Treatments   Labs (all labs ordered are listed, but only abnormal results are displayed) Labs Reviewed  COMPREHENSIVE METABOLIC PANEL - Abnormal; Notable for the following components:      Result Value   Glucose, Bld 110 (*)    Total Protein 6.3 (*)    Total Bilirubin 1.5 (*)    All other components within normal limits  SALICYLATE LEVEL - Abnormal; Notable for the following components:   Salicylate Lvl <4.2 (*)    All other components within normal limits  ACETAMINOPHEN LEVEL - Abnormal; Notable for the following components:   Acetaminophen (Tylenol), Serum <10 (*)    All other components within normal limits  CBC - Abnormal; Notable for the following components:   MCV 103.0 (*)    MCH 35.0 (*)    All other components within normal limits  RAPID URINE DRUG SCREEN, HOSP PERFORMED - Abnormal; Notable for the following components:   Benzodiazepines POSITIVE (*)    All other components within normal limits  RESP PANEL BY RT-PCR (FLU A&B, COVID) ARPGX2  ETHANOL    EKG None  Radiology No results found.  Procedures Procedures   Medications Ordered in ED Medications - No data to display  ED Course  I have reviewed the triage  vital signs and the nursing  notes.  Pertinent labs & imaging results that were available during my care of the patient were reviewed by me and considered in my medical decision making (see chart for details).    MDM Rules/Calculators/A&P                           Patient is 80 year old male who presents with depression, feelings of hopelessness, and passive suicidal ideation for several weeks.  Patient states that he had a plan to hurt himself with kitchen knives last night.  Endorses increased stress in his life, and is worried about his progression of thoughts leading to taking his life.  This worried him enough that he contacted the police department to bring him to the hospital.  Notes family history of suicide and bipolar disorder.  On exam patient has passive suicidal ideation, no active plan.  No homicidal ideation, visual or auditory hallucinations.  No other complaints at this time.    Medical clearance labs ordered.  Patient is medical cleared at this time, plan to consult TTS. Appreciate their recommendations.  Final Clinical Impression(s) / ED Diagnoses Final diagnoses:  Suicidal ideation    Rx / DC Orders ED Discharge Orders     None        Kateri Plummer, PA-C 02/19/21 1841    Pattricia Boss, MD 02/20/21 1127

## 2021-02-19 NOTE — BH Assessment (Signed)
Clinician sent a secure IM to pt's team at 2300 requesting the Tele-Assessment machine be moved into pt's room to complete pt's BH Assessment. Clinician received no response, so at 2315 clinician messaged pt's team again to inform them that clinician was moving on to see another pt but would meet with the pt upon learning the cart is ready and pt is alert/oriented.

## 2021-02-20 ENCOUNTER — Telehealth: Payer: Self-pay | Admitting: *Deleted

## 2021-02-20 DIAGNOSIS — F32A Depression, unspecified: Secondary | ICD-10-CM | POA: Diagnosis not present

## 2021-02-20 DIAGNOSIS — R45851 Suicidal ideations: Secondary | ICD-10-CM | POA: Diagnosis not present

## 2021-02-20 LAB — RESP PANEL BY RT-PCR (FLU A&B, COVID) ARPGX2
Influenza A by PCR: NEGATIVE
Influenza B by PCR: NEGATIVE
SARS Coronavirus 2 by RT PCR: NEGATIVE

## 2021-02-20 NOTE — ED Notes (Signed)
Breakfast Ordered 

## 2021-02-20 NOTE — Progress Notes (Signed)
Patient is under review at [facility]. Additional medical information has been sent via fax. IVC paperwork is currently pending. CSW contacted the patient's family for collateral regarding potential [facility] admission and transport. Per Fredderick Severance, she is supportive of the admission and is aware of the potential distance.    Mariea Clonts, MSW, LCSW-A  2:02 PM 02/20/2021

## 2021-02-20 NOTE — ED Notes (Signed)
TSS SCREEN with patient

## 2021-02-20 NOTE — ED Notes (Signed)
[name] Daughter [phone] requesting to speak to someone about the patient

## 2021-02-20 NOTE — Progress Notes (Addendum)
CSW informed that pt is unable to transition to [facility] due to transportation. Pt is IVC'd and [facility] does not have availability after 5pm. CSW spoke with [facility] who verbalized understanding that pt will transfer to their facility tomorrow 02/21/21 once transportation is available.    Care Team notified via secure chat: Elsie Lincoln, RN, Pattricia Boss, MD, Merlyn Lot, NP, and Ernie Hew, MD.   Benjaman Kindler, MSW, [facility] 02/20/2021 7:18 PM

## 2021-02-20 NOTE — Progress Notes (Signed)
CSW received a phone call from pt's wife [name]) and daughter [name]) [phone] phone number inquiring about information in regards to pt's options for inpatient behavioral health services. Pt's daughter reports that pt's physician Dr. Donna Christen would like pt to be admitted to [facility]. CSW advised that pt's referral was sent there however has not received a bed offer. CSW agreed to follow up on referral with [facility], but was unsuccessful with speaking with a representative but left a HIPAA compliant voicemail. Pt's daughter, [name] advised CSW that she and her mother reviewed [facility] through the Internet and were not happy with the reviews. CSW acknowledged family's concerns and provided education about the goal of transitioning pt to the next appropriate level of care in a timely manner. [name] verbalized understanding and shared that she tried to call [facility] but could not contact anyone. CSW advised that CSW would have a representative call her from [facility].   CSW called [facility] and worked with Tiffany who agreed to call pt's family' [name] [phone] with CSW. CSW and Tiffany followed up after speak with the family and discussed that all questions have been addressed and that the pt's daughter will be bringing pt clothing to the ED so that he can have new clothing at [facility] for tomorrow.  CSW will continue to assist and follow pt.  Benjaman Kindler, MSW, LCSWA 02/20/2021 8:16 PM

## 2021-02-20 NOTE — Progress Notes (Signed)
Pt accepted to [facility]     Patient meets inpatient criteria per Trinna Post, PA   The attending provider will be Dr. Alcide Clever  Call report to [phone]  Elsie Lincoln, RN @ [facility] ED notified.     Pt scheduled  to arrive at [facility] anytime today. IVC paperwork must be received prior to transporting the Pt. RN to send IVC paperwork to [phone].    Mariea Clonts, MSW, LCSW-A  3:31 PM 02/20/2021

## 2021-02-20 NOTE — BH Assessment (Signed)
Comprehensive Clinical Assessment (CCA) Note  02/20/2021 [name] [id number]  Disposition: James Post, PA, patient meets inpatient treatment. [name] is recommended. Disposition SW to secure placement in the AM. James Austria, RN, informed of disposition.  The patient demonstrates the following risk factors for suicide: Chronic risk factors for suicide include: psychiatric disorder of depression and completed suicide in a family member. Acute risk factors for suicide include: N/A. Protective factors for this patient include: positive social support, responsibility to others (children, family), coping skills, and hope for the future. Considering these factors, the overall suicide risk at this point appears to be high. Patient is not appropriate for outpatient follow up.  [name] from 02/19/2021 in [name] from 01/29/2021 in [facility] CATEGORY High Risk No Risk      [name] is a 80 year old male presenting voluntary to [facility] due to [name] with plan to hurt himself with kitchen knives on last night. Patient reported only SI today with plan. Patient denied HI, psychosis and alcohol/drug usage. Patient reported "I recently became aware that I am suicidal". Throughout entire assessment patient continually states "my mental health is sliding down hill". Patient unable to specifically describe current mental status and states "I need something to deal with what's going on inside of me". Patient reported worsening depressive symptoms. Per triage patient came to [name] for increased panic attacks and not taking his bipolar medications after reading adverse reactions and believing that it would cause a "bipolar episode". . Patient reported intermittent SI to triage and that he felt helpless. Patient father committed suicide when patient was in his late 22's or early 64's. Patient denied prior psych  hospitalizations, suicide attempts, self-harming behaviors and alcohol/drug usage. Patient denied access to guns. Collateral contact, [name], wife, [phone], patient gave consent for additional information. TTS unable to reach wife,will attempt at later time. Patient was calm and cooperative during assessment.   Chief Complaint:  Chief Complaint  Patient presents with   Suicidal   Visit Diagnosis:  Major depressive disorder  CCA Biopsychosocial Patient Reported Schizophrenia/Schizoaffective Diagnosis in Past: No data recorded  Strengths: self-awareness  Mental Health Symptoms Depression:   Increase/decrease in appetite; Worthlessness; Hopelessness; Tearfulness; Fatigue; Change in energy/activity; Sleep (too much or little)   Duration of Depressive symptoms:  Duration of Depressive Symptoms: Greater than two weeks   Mania:   None   Anxiety:    Worrying; Tension; Sleep; Restlessness   Psychosis:   None   Duration of Psychotic symptoms:    Trauma:   None   Obsessions:   None   Compulsions:   None   Inattention:   None   Hyperactivity/Impulsivity:   None   Oppositional/Defiant Behaviors:   None   Emotional Irregularity:   None   Other Mood/Personality Symptoms:  No data recorded   Mental Status Exam Appearance and self-care  Stature:   Average   Weight:   Average weight   Clothing:   Age-appropriate   Grooming:   Normal   Cosmetic use:   None   Posture/gait:   Normal   Motor activity:   Not Remarkable   Sensorium  Attention:   Normal   Concentration:   Normal   Orientation:   X5   Recall/memory:   Normal   Affect and Mood  Affect:   Appropriate; Depressed   Mood:   Depressed; Hopeless; Worthless   Relating  Eye contact:  Normal   Facial expression:   Sad   Attitude toward examiner:   Cooperative   Thought and Language  Speech flow:  Clear and Coherent   Thought content:   Appropriate to Mood and  Circumstances   Preoccupation:   None   Hallucinations:   None   Organization:  No data recorded  [employer] of Knowledge:   Average   Intelligence:   Average   Abstraction:   Functional   Judgement:   Fair   Reality Testing:  No data recorded  Insight:   Good   Decision Making:   Normal   Social Functioning  Social Maturity:   Responsible   Social Judgement:   Normal   Stress  Stressors:   Transitions   Coping Ability:   Exhausted; Overwhelmed   Skill Deficits:  No data recorded  Supports:   Family; Support needed    Religion: Religion/Spirituality Are You A Religious Person?:  [name])  Leisure/Recreation: Leisure / Recreation Do You Have Hobbies?:  [name])  Exercise/Diet: Exercise/Diet Do You Exercise?:  (uta) Have You Gained or Lost A Significant Amount of Weight in the Past Six Months?:  (uta) Do You Follow a Special Diet?:  (uta) Do You Have Any Trouble Sleeping?:  (uta)  CCA Employment/Education Employment/Work Situation: Employment / Work Situation Employment Situation: Retired Social research officer, 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 Current Illness: No  Education: Education Is Patient Currently Attending School?: No Last Grade Completed:  (uta) Did You Attend College?:  (uta) Did You Have An Individualized Education Program (IIEP):  (uta) Did You Have Any Difficulty At School?:  [name]) Patient's Education Has Been Impacted by Current Illness:  (uta)  CCA Family/Childhood History Family and Relationship History: Family history Marital status: Married Number of Years Married: 59 What types of issues is patient dealing with in the relationship?: good relationship Does patient have children?: Yes How many children?: 2 How is patient's relationship with their children?: good  Childhood History:  Childhood History By whom was/is the patient raised?: Both parents Did patient suffer any verbal/emotional/physical/sexual abuse as a child?:  No ([place] from father) Did patient suffer from severe childhood neglect?: No Has patient ever been sexually abused/assaulted/raped as an adolescent or adult?: No  Child/Adolescent Assessment:   CCA Substance Use Alcohol/Drug Use: Alcohol / Drug Use Pain Medications: see MAR Prescriptions: see MAR Over the Counter: see MAR History of alcohol / drug use?: No history of alcohol / drug abuse   ASAM's:  Six Dimensions of Multidimensional Assessment  Dimension 1:  Acute Intoxication and/or Withdrawal Potential:      Dimension 2:  Biomedical Conditions and Complications:      Dimension 3:  Emotional, Behavioral, or Cognitive Conditions and Complications:     Dimension 4:  Readiness to Change:     Dimension 5:  Relapse, Continued use, or Continued Problem Potential:     Dimension 6:  Recovery/Living Environment:     ASAM Severity Score:    ASAM Recommended Level of Treatment:     Substance use Disorder (SUD)   Recommendations for Services/Supports/Treatments: Recommendations for Services/Supports/Treatments Recommendations For Services/Supports/Treatments: Individual Therapy, Medication Management, Inpatient Hospitalization  Discharge Disposition:   DSM5 Diagnoses: Patient Active Problem List   Diagnosis Date Noted   Erythrocytosis 05/28/2020   Macrocytosis without anemia 05/28/2020   DNR (do not resuscitate) 03/30/2019   Failed total hip arthroplasty, initial encounter ([place]) 07/10/2017   Primary osteoarthritis of right hip 06/22/2017   Osteoarthritis of right hip 06/22/2017  Pre-operative clearance 05/19/2017   Weight gain 03/30/2015   Constipation 12/28/2014   Bilateral lower extremity edema 12/28/2014   RBBB 08/14/2014   Psoriasis 08/14/2014   Chest pain with high risk of acute coronary syndrome 08/14/2014   Essential hypertension 08/14/2014   Unstable angina ([place]) 08/14/2014   Hoarseness 06/15/2014   Rash and nonspecific skin eruption 06/15/2014   Submandibular  swelling 05/15/2013   Asthma with acute exacerbation 08/18/2012   Eustachian tube dysfunction 04/05/2012   Allergic rhinitis due to pollen 06/28/2010   ABDOMINAL BRUIT 03/04/2010   Dyslipidemia 03/07/2009   CAD S/P PCI LAD 1987, `1997, 08/15/14 PCI pLAD DES 03/07/2009   BPH (benign prostatic hyperplasia) 03/07/2009   Hypothyroidism 03/06/2009   Allergic-infective asthma 04/06/2007   Centrilobular emphysema ([place]) 04/06/2007   Referrals to Alternative Service(s): Referred to Alternative Service(s):   Place:   Date:   Time:    Referred to Alternative Service(s):   Place:   Date:   Time:    Referred to Alternative Service(s):   Place:   Date:   Time:    Referred to Alternative Service(s):   Place:   Date:   Time:     Venora Maples, [facility]

## 2021-02-20 NOTE — ED Notes (Signed)
IVC paper work done, papers handed to [place]

## 2021-02-20 NOTE — Progress Notes (Signed)
Patient has been accepted to [facility] for evening admission. CSW notified the patient's wife Aqeel Norgaard via phone regarding the acceptance and contact information for the facility.   Mariea Clonts, MSW, LCSW-A  2:27 PM 02/20/2021

## 2021-02-20 NOTE — ED Provider Notes (Signed)
Emergency Medicine Observation Re-evaluation Note  MAR WALMER is a 80 y.o. male, seen on rounds today.  Pt initially presented to the ED for complaints of Suicidal Currently, the patient is patient is laying in bed and appears stable.  Physical Exam  BP 138/82 (BP Location: Right Arm)   Pulse 85   Temp 98 F (36.7 C) (Oral)   Resp 15   SpO2 99%  Physical Exam General: Elderly male laying in bed and observed ambulating does not appear to be in acute distress Cardiac: Regular rate and rhythm Lungs: Nonlabored Psych: Patient appears sad and has flat affect but is not actively suicidal  ED Course / MDM  EKG:   I have reviewed the labs performed to date as well as medications administered while in observation.  Recent changes in the last 24 hours include considered for admission for [facility].  Plan  Current plan is for patient is considered for admission for [facility].  NHAT HEARNE is not under involuntary commitment. I was asked to evaluate the patient as 1 geriatric psychiatric facility asked for the patient to be IVC need to facilitate their admission process I have discussed this with the patient.  He states that he felt very sad yesterday and considered harming himself even possible with a knife.  However, he currently denies this.  He states that he is sad right now but is not thinking of harming himself.  He does feel overwhelmed and 1 somewhat despairing regarding the situation at home.  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  He feels that if he went home he would be in the same situation as he was before and he may revert. Currently patient does not meet IVC criteria although he does peer to need hospitalization depression.    Pattricia Boss, MD 02/20/21 1130

## 2021-02-20 NOTE — Progress Notes (Signed)
Visited with Mr. [name]  per daughter  request  . Provided prayer and listening. Patient acknowledge he is having some emotional challenges at this time.  Pt. Indicated that he was going to be moved to [place]. He also spoke of his daughter and wife. Pt said he scheduled to go to another facility.  Jaclynn Major, [place], [facility], Pager [phone]

## 2021-02-20 NOTE — ED Notes (Addendum)
TTS SCREEN with patient.

## 2021-02-20 NOTE — Telephone Encounter (Signed)
Patient scheduled sleep consult 03/01/21 with Dr. Ander Slade.  Per OV notes faxed to office Patient is on cpap.  No sleep studies located in chart.  Patient not in [place]. Patient currently admitted.  Will follow up with Patient regarding cpap, sleep study, and bringing SD card to OV.

## 2021-02-20 NOTE — Progress Notes (Signed)
Patient has been denied by [facility] due to no geri psych beds available. Patient meets [facility] inpatient criteria per Trinna Post, PA-C. Patient has been faxed out to the following facilities:   [facility]  [address] [place] [address] [phone] --  [place]  [address] [place] [address] [phone] --  [facility]  [address] [place] [address] [phone] --  [facility]  [address], [place] [address] [phone] --  [facility]  [address] [place], [place] [address] [phone] --  [facility]  [address] [place] [address] [phone] --  [facility]  [address], [place] [address] [phone] --  [facility]  [address], [place] [address] [phone] --  [facility]  [address] [place] [address] [phone] --  [facility]  [address] [place] [address] [phone] --  [facility]  [address] [place] [address] [phone] --  [facility]  [address], [place] [address] [phone] --  [place]  [address] [place] [address] [phone] --  [facility]  [place], [place] [address] [place] --  [facility]  [address] [place] [address] [phone] --  [facility]  [address], [place] [address] [phone] --    Mariea Clonts, MSW, LCSW-A   10:15 AM 02/20/2021

## 2021-02-21 DIAGNOSIS — R45851 Suicidal ideations: Secondary | ICD-10-CM | POA: Diagnosis not present

## 2021-02-21 DIAGNOSIS — F32A Depression, unspecified: Secondary | ICD-10-CM | POA: Diagnosis not present

## 2021-02-21 MED ORDER — LEVOTHYROXINE SODIUM 125 MCG PO TABS
125.0000 ug | ORAL_TABLET | Freq: Every day | ORAL | Status: DC
  Administered 2021-02-21 – 2021-02-22 (×2): 125 ug via ORAL
  Filled 2021-02-21 (×2): qty 1

## 2021-02-21 MED ORDER — OLANZAPINE 5 MG PO TBDP
5.0000 mg | ORAL_TABLET | Freq: Once | ORAL | Status: AC
  Administered 2021-02-21: 5 mg via ORAL
  Filled 2021-02-21: qty 1

## 2021-02-21 MED ORDER — LORAZEPAM 1 MG PO TABS
0.5000 mg | ORAL_TABLET | Freq: Once | ORAL | Status: AC
  Administered 2021-02-21: 0.5 mg via ORAL
  Filled 2021-02-21: qty 1

## 2021-02-21 MED ORDER — PANTOPRAZOLE SODIUM 40 MG PO TBEC
40.0000 mg | DELAYED_RELEASE_TABLET | Freq: Every day | ORAL | Status: DC
  Administered 2021-02-21 – 2021-02-22 (×2): 40 mg via ORAL
  Filled 2021-02-21 (×3): qty 1

## 2021-02-21 MED ORDER — TRAZODONE HCL 50 MG PO TABS
50.0000 mg | ORAL_TABLET | Freq: Every day | ORAL | Status: DC
  Administered 2021-02-21: 50 mg via ORAL
  Filled 2021-02-21: qty 1

## 2021-02-21 MED ORDER — ASPIRIN EC 81 MG PO TBEC
81.0000 mg | DELAYED_RELEASE_TABLET | Freq: Every day | ORAL | Status: DC
  Administered 2021-02-21 – 2021-02-22 (×2): 81 mg via ORAL
  Filled 2021-02-21 (×2): qty 1

## 2021-02-21 MED ORDER — TRAZODONE HCL 50 MG PO TABS: 50.0000 mg | ORAL_TABLET | Freq: Every day | ORAL | Status: DC

## 2021-02-21 MED ORDER — MELATONIN 5 MG PO TABS
5.0000 mg | ORAL_TABLET | Freq: Every day | ORAL | Status: DC
  Administered 2021-02-21 (×2): 5 mg via ORAL
  Filled 2021-02-21 (×2): qty 1

## 2021-02-21 MED ORDER — ATORVASTATIN CALCIUM 40 MG PO TABS
40.0000 mg | ORAL_TABLET | Freq: Every day | ORAL | Status: DC
  Administered 2021-02-21 – 2021-02-22 (×2): 40 mg via ORAL
  Filled 2021-02-21 (×2): qty 1

## 2021-02-21 MED ORDER — EZETIMIBE 10 MG PO TABS
10.0000 mg | ORAL_TABLET | Freq: Every evening | ORAL | Status: DC
  Administered 2021-02-21: 10 mg via ORAL
  Filled 2021-02-21: qty 1

## 2021-02-21 NOTE — Progress Notes (Signed)
Pt was accepted to [facility] 02/22/21 after 0700am.  Pt meets inpatient criteria per Trinna Post, PA-C  Attending Physician will be Dr. Gerrit Halls  Report can be called to: [phone]  Pt can arrive after: [place] team notified via secure chat: Camryn Cogan, RN,Shnese Jerelene Redden, NP, and Dr. Ashok Cordia.  Nadara Mode, LCSWA 02/21/2021 @ 7:15 PM

## 2021-02-21 NOTE — ED Notes (Signed)
Patient assisted to bathroom by this RN patient states "I do not ever want to take that zyprexa again. Made me feel too weird."

## 2021-02-21 NOTE — ED Provider Notes (Addendum)
Emergency Medicine Observation Re-evaluation Note  [name] is a 80 y.o. male, seen on rounds today.  Pt initially presented to the ED for complaints of suicidal thoughts. Pt currently resting comfortably, easily aroused, no acute distress. Pt continues to endorse intermittent SI. Pt has eaten/drank well today. No new c/o, no pain.   Physical Exam  BP 135/79 (BP Location: Left Arm)   Pulse 80   Temp 98.1 F (36.7 C)   Resp 17   SpO2 97%  Physical Exam General: alert, content Cardiac: regular rate Lungs: breathing comfortably Psych: alert, depressed mood. +SI. Pt does not appear to be responding to internal stimuli.   ED Course / MDM    I have reviewed the labs performed to date as well as medications administered while in observation.  Recent changes in the last 24 hours include ED obs and reassessment.   Plan   [name] is under involuntary commitment.   [place] team indicates pt accepted to [facility], Dr Alcide Clever.  Not clear why patient not transported yesterday - will discuss w SW.   Pt currently appears stable for transfer.   Lajean Saver, MD 02/21/21 1339  Discussed w BH/SW - they will coordinate with RN to make sure transported tomorrow AM. They confirmed pts bed still available.    Lajean Saver, MD 02/21/21 [place] SW now indicates [facility] has given away bed, no longer accepting, and indicates patient now accepted to [place], to go there in AM tomorrow.       Lajean Saver, MD 02/21/21 1911

## 2021-02-21 NOTE — ED Notes (Signed)
CALLED BY IDA THE PATIENT WILL BE TX IN AM AFTER 800AM TOMORROW

## 2021-02-21 NOTE — Progress Notes (Signed)
Inpatient Behavioral Health Follow-up  CSW spoke with nursing Camryn Cogan, RN who advised that there was miscommunication regarding coordinating transportation for pt since pt is under IVC.  Pt was supposed to admitted to [facility] yesterday and today during 1st shift but did not due to transportation error.    CSW called [facility] and spoke with [place] who advised that pt's bed could no longer be held since it has been over 48 hours since pt was accepted and has yet to arrive. CSW attempted to advocate for pt, however was advised to send referral again to have night shift review.    CSW and Dr. Wynelle Cleveland identified that the orginal bed offer was just made yesterday. Pt is medically cleared. This CSW will send out referral again and then follow back up with [facility]. CSW will continue to assist and secure placement for pt.     Benjaman Kindler, MSW, [facility] 02/21/2021 6:58 PM

## 2021-02-21 NOTE — Progress Notes (Signed)
Inpatient Behavioral Health Placement  Meets inpatient criteria per [facility], PA-C.  Referral was sent to the following facilities;   Destination Service Provider Address Phone Fax  [facility]  [address] [place] [address] [phone]  [place]  [address] [place] [address] [phone]  [facility]  [place], [place] [address] [phone]  [facility]  [address], [place] [address] [phone]  [facility]  [address] [place], [place] [address] [phone] [facility]  [address] [place] [address] [phone] [facility]  [address], [place] [address] [phone]  [facility]  [address] [place] [address] [phone] [place]  [address] [place] [address] [phone] [place], [place] [address] [phone]  [facility]  [address] [place] [address] [phone]  [facility]  [address] [place] [address] [place]  [address] [place] [address] [phone] [facility]  [place], [place] [address] [phone]  [facility]  [address] [place] [address] [phone]  [facility]  [address] [place], [place] [address]      Situation ongoing,  CSW will follow up.   Benjaman Kindler, MSW, LCSWA 02/21/2021  @ 7:03 PM

## 2021-02-21 NOTE — Progress Notes (Signed)
CSW received a phone call from pt's daughter [name] [phone] who reported that she was upset about pt now being placed 3 hours away. CSW used active listening skills, provided empathy, and validated pt daughter's feelings.  CSW provided education about the process of IVC and about the goal of getting pt to the next appropriate level of care. CSW explained that pt can not stay in the ED and wait for their "perfect" placement plan. Pt's daughter verbalized understanding and requested that CSW check again with [facility]. CSW agreed to inquire with [facility] upon request of the family.  CSW called [facility] to follow up on the most recent referral for pt. CSW spoke with [place] who informed CSW that pt's referral can not be reviewed until tomorrow because pt has already had two failed attempts with admission: pt was supposed to arrive at [facility] on 02/20/21 in the evening and on 02/21/21 in the morning. Both times, Martie Lee explained that prevented another pt from admitting. CSW verbalized understanding and Martie Lee advised CSW to have the referral revieiwed again tomorrow after noon. CSW informed that pt already has another bed offer and will most likely not be in the ED at noon.    CSW called pt's daughter, Malachy Mood back who reported that she was also with her mother. CSW informed both pt's wife and daughter that [facility] is still unable to accommodate and accept pt today or in the morning. Pt's family agreed to the placement with [facility].    CSW provided resources to pt's daughter: Trish Fountain. CSW provided education about POA. CSW informed that pt's daughter needs a consent on record so that she can be added to pt's chart for contact. At this time the only contact on pt's chart is pt's spouse.   CSW and disposition SW team will continue to assist with pt and placement needs.   Benjaman Kindler, MSW, [facility] 02/21/2021 10:47 PM

## 2021-02-21 NOTE — Discharge Instructions (Addendum)
Transfer to [facility]

## 2021-02-21 NOTE — ED Notes (Signed)
Pt states he realized he does "not want to die." States he feels "out of sync" because of being in the emergency department. Pt expresses need of being on a regular schedule, and need for a shower. Pt advised we will help him with hygene as soon as a staff member if available. Pt reports some buzzing in his ear and tingling in bilateral distal extremities ("depending on the way I am lying"). Pt neuro exam with no sensory deficit and equal strength bilaterally.

## 2021-02-21 NOTE — ED Notes (Signed)
Breakfast orders placed 

## 2021-02-21 NOTE — ED Notes (Signed)
Pt's wife updated and pt talking with wife via phone at this time. Pt continues to be very anxious and states "I'm still hurting myself in my mind." EDP notified. See new order.

## 2021-02-21 NOTE — ED Notes (Signed)
Attempted to call [name], wife, per pts request. Wife did not answer call and mailbox is full.

## 2021-02-21 NOTE — ED Notes (Signed)
Per Shelton Silvas, LCSW, pt to go to "[facility] for tomorrow after [phone]. accepting physician is Dr. Gerrit Halls. Pt will admit to the acute Geriatric unit. Phone number for report [phone]. Send IVC paperwork with pt and coordinate transport". ER [facility] called to coordinate transportation.

## 2021-02-21 NOTE — ED Notes (Signed)
Pt refusing trazodone states it makes him very unsteady and unable to focus. Will send message to pharmacy to reschedule to time of sleep. Pt states he does want to take the Protonix that he previously refused, states he was not fully awake when he refused it this morning.

## 2021-02-21 NOTE — ED Provider Notes (Signed)
  Physical Exam  BP 135/79 (BP Location: Left Arm)   Pulse 80   Temp 98.1 F (36.7 C)   Resp 17   SpO2 97%   Physical Exam  ED Course/Procedures     Procedures  MDM  Currently pending placement.  Reviewing notes appears as if it may be [facility] [place].  Reportedly did not like the Zyprexa that he had taken yesterday.       Davonna Belling, MD 02/21/21 1153

## 2021-02-22 DIAGNOSIS — R5383 Other fatigue: Secondary | ICD-10-CM | POA: Diagnosis not present

## 2021-02-22 DIAGNOSIS — J449 Chronic obstructive pulmonary disease, unspecified: Secondary | ICD-10-CM | POA: Diagnosis not present

## 2021-02-22 DIAGNOSIS — E119 Type 2 diabetes mellitus without complications: Secondary | ICD-10-CM | POA: Diagnosis not present

## 2021-02-22 DIAGNOSIS — K219 Gastro-esophageal reflux disease without esophagitis: Secondary | ICD-10-CM | POA: Diagnosis not present

## 2021-02-22 DIAGNOSIS — I10 Essential (primary) hypertension: Secondary | ICD-10-CM | POA: Diagnosis not present

## 2021-02-22 DIAGNOSIS — R269 Unspecified abnormalities of gait and mobility: Secondary | ICD-10-CM | POA: Diagnosis not present

## 2021-02-22 DIAGNOSIS — R110 Nausea: Secondary | ICD-10-CM | POA: Diagnosis not present

## 2021-02-22 DIAGNOSIS — F332 Major depressive disorder, recurrent severe without psychotic features: Secondary | ICD-10-CM | POA: Diagnosis not present

## 2021-02-22 DIAGNOSIS — K5909 Other constipation: Secondary | ICD-10-CM | POA: Diagnosis not present

## 2021-02-22 DIAGNOSIS — F0390 Unspecified dementia without behavioral disturbance: Secondary | ICD-10-CM | POA: Diagnosis not present

## 2021-02-22 DIAGNOSIS — N390 Urinary tract infection, site not specified: Secondary | ICD-10-CM | POA: Diagnosis not present

## 2021-02-22 DIAGNOSIS — F418 Other specified anxiety disorders: Secondary | ICD-10-CM | POA: Diagnosis not present

## 2021-02-22 DIAGNOSIS — Z79899 Other long term (current) drug therapy: Secondary | ICD-10-CM | POA: Diagnosis not present

## 2021-02-22 DIAGNOSIS — E785 Hyperlipidemia, unspecified: Secondary | ICD-10-CM | POA: Diagnosis not present

## 2021-02-22 DIAGNOSIS — A539 Syphilis, unspecified: Secondary | ICD-10-CM | POA: Diagnosis not present

## 2021-02-22 DIAGNOSIS — E039 Hypothyroidism, unspecified: Secondary | ICD-10-CM | POA: Diagnosis not present

## 2021-02-22 DIAGNOSIS — I2510 Atherosclerotic heart disease of native coronary artery without angina pectoris: Secondary | ICD-10-CM | POA: Diagnosis not present

## 2021-02-22 NOTE — ED Notes (Signed)
Ambulated to restroom with assistance.

## 2021-02-22 NOTE — ED Notes (Signed)
Per Security, valuables were sent home w/ Mr. Schaab daughter

## 2021-02-22 NOTE — ED Notes (Signed)
Awake, alert and talking

## 2021-03-01 ENCOUNTER — Institutional Professional Consult (permissible substitution): Payer: Medicare PPO | Admitting: Pulmonary Disease

## 2021-03-08 DIAGNOSIS — G4733 Obstructive sleep apnea (adult) (pediatric): Secondary | ICD-10-CM | POA: Diagnosis not present

## 2021-03-08 DIAGNOSIS — E119 Type 2 diabetes mellitus without complications: Secondary | ICD-10-CM | POA: Diagnosis not present

## 2021-03-08 DIAGNOSIS — I10 Essential (primary) hypertension: Secondary | ICD-10-CM | POA: Diagnosis not present

## 2021-03-08 DIAGNOSIS — M159 Polyosteoarthritis, unspecified: Secondary | ICD-10-CM | POA: Diagnosis not present

## 2021-03-08 DIAGNOSIS — I2510 Atherosclerotic heart disease of native coronary artery without angina pectoris: Secondary | ICD-10-CM | POA: Diagnosis not present

## 2021-03-08 DIAGNOSIS — J449 Chronic obstructive pulmonary disease, unspecified: Secondary | ICD-10-CM | POA: Diagnosis not present

## 2021-03-08 DIAGNOSIS — E039 Hypothyroidism, unspecified: Secondary | ICD-10-CM | POA: Diagnosis not present

## 2021-03-08 DIAGNOSIS — L304 Erythema intertrigo: Secondary | ICD-10-CM | POA: Diagnosis not present

## 2021-03-11 DIAGNOSIS — I2510 Atherosclerotic heart disease of native coronary artery without angina pectoris: Secondary | ICD-10-CM | POA: Diagnosis not present

## 2021-03-11 DIAGNOSIS — J449 Chronic obstructive pulmonary disease, unspecified: Secondary | ICD-10-CM | POA: Diagnosis not present

## 2021-03-11 DIAGNOSIS — I10 Essential (primary) hypertension: Secondary | ICD-10-CM | POA: Diagnosis not present

## 2021-03-11 DIAGNOSIS — M159 Polyosteoarthritis, unspecified: Secondary | ICD-10-CM | POA: Diagnosis not present

## 2021-03-11 DIAGNOSIS — E119 Type 2 diabetes mellitus without complications: Secondary | ICD-10-CM | POA: Diagnosis not present

## 2021-03-11 DIAGNOSIS — E039 Hypothyroidism, unspecified: Secondary | ICD-10-CM | POA: Diagnosis not present

## 2021-03-11 DIAGNOSIS — G4733 Obstructive sleep apnea (adult) (pediatric): Secondary | ICD-10-CM | POA: Diagnosis not present

## 2021-03-11 DIAGNOSIS — L304 Erythema intertrigo: Secondary | ICD-10-CM | POA: Diagnosis not present

## 2021-03-12 DIAGNOSIS — E119 Type 2 diabetes mellitus without complications: Secondary | ICD-10-CM | POA: Diagnosis not present

## 2021-03-12 DIAGNOSIS — M159 Polyosteoarthritis, unspecified: Secondary | ICD-10-CM | POA: Diagnosis not present

## 2021-03-12 DIAGNOSIS — L304 Erythema intertrigo: Secondary | ICD-10-CM | POA: Diagnosis not present

## 2021-03-12 DIAGNOSIS — E039 Hypothyroidism, unspecified: Secondary | ICD-10-CM | POA: Diagnosis not present

## 2021-03-12 DIAGNOSIS — J449 Chronic obstructive pulmonary disease, unspecified: Secondary | ICD-10-CM | POA: Diagnosis not present

## 2021-03-12 DIAGNOSIS — I10 Essential (primary) hypertension: Secondary | ICD-10-CM | POA: Diagnosis not present

## 2021-03-12 DIAGNOSIS — G4733 Obstructive sleep apnea (adult) (pediatric): Secondary | ICD-10-CM | POA: Diagnosis not present

## 2021-03-12 DIAGNOSIS — I2510 Atherosclerotic heart disease of native coronary artery without angina pectoris: Secondary | ICD-10-CM | POA: Diagnosis not present

## 2021-03-15 DIAGNOSIS — M159 Polyosteoarthritis, unspecified: Secondary | ICD-10-CM | POA: Diagnosis not present

## 2021-03-15 DIAGNOSIS — E039 Hypothyroidism, unspecified: Secondary | ICD-10-CM | POA: Diagnosis not present

## 2021-03-15 DIAGNOSIS — M47812 Spondylosis without myelopathy or radiculopathy, cervical region: Secondary | ICD-10-CM | POA: Diagnosis not present

## 2021-03-15 DIAGNOSIS — M5030 Other cervical disc degeneration, unspecified cervical region: Secondary | ICD-10-CM | POA: Diagnosis not present

## 2021-03-15 DIAGNOSIS — E119 Type 2 diabetes mellitus without complications: Secondary | ICD-10-CM | POA: Diagnosis not present

## 2021-03-15 DIAGNOSIS — I10 Essential (primary) hypertension: Secondary | ICD-10-CM | POA: Diagnosis not present

## 2021-03-15 DIAGNOSIS — G4733 Obstructive sleep apnea (adult) (pediatric): Secondary | ICD-10-CM | POA: Diagnosis not present

## 2021-03-15 DIAGNOSIS — I2510 Atherosclerotic heart disease of native coronary artery without angina pectoris: Secondary | ICD-10-CM | POA: Diagnosis not present

## 2021-03-15 DIAGNOSIS — L304 Erythema intertrigo: Secondary | ICD-10-CM | POA: Diagnosis not present

## 2021-03-16 DIAGNOSIS — R45851 Suicidal ideations: Secondary | ICD-10-CM | POA: Diagnosis not present

## 2021-03-16 DIAGNOSIS — J449 Chronic obstructive pulmonary disease, unspecified: Secondary | ICD-10-CM | POA: Diagnosis not present

## 2021-03-16 DIAGNOSIS — H04123 Dry eye syndrome of bilateral lacrimal glands: Secondary | ICD-10-CM | POA: Diagnosis not present

## 2021-03-16 DIAGNOSIS — R4182 Altered mental status, unspecified: Secondary | ICD-10-CM | POA: Diagnosis not present

## 2021-03-16 DIAGNOSIS — N400 Enlarged prostate without lower urinary tract symptoms: Secondary | ICD-10-CM | POA: Diagnosis not present

## 2021-03-16 DIAGNOSIS — I10 Essential (primary) hypertension: Secondary | ICD-10-CM | POA: Diagnosis not present

## 2021-03-16 DIAGNOSIS — E039 Hypothyroidism, unspecified: Secondary | ICD-10-CM | POA: Diagnosis not present

## 2021-03-16 DIAGNOSIS — I252 Old myocardial infarction: Secondary | ICD-10-CM | POA: Diagnosis not present

## 2021-03-16 DIAGNOSIS — K5900 Constipation, unspecified: Secondary | ICD-10-CM | POA: Diagnosis not present

## 2021-03-16 DIAGNOSIS — E785 Hyperlipidemia, unspecified: Secondary | ICD-10-CM | POA: Diagnosis not present

## 2021-03-16 DIAGNOSIS — Z20822 Contact with and (suspected) exposure to covid-19: Secondary | ICD-10-CM | POA: Diagnosis not present

## 2021-03-16 DIAGNOSIS — G319 Degenerative disease of nervous system, unspecified: Secondary | ICD-10-CM | POA: Diagnosis not present

## 2021-03-16 DIAGNOSIS — L409 Psoriasis, unspecified: Secondary | ICD-10-CM | POA: Diagnosis not present

## 2021-03-16 DIAGNOSIS — F339 Major depressive disorder, recurrent, unspecified: Secondary | ICD-10-CM | POA: Diagnosis not present

## 2021-03-16 DIAGNOSIS — H04129 Dry eye syndrome of unspecified lacrimal gland: Secondary | ICD-10-CM | POA: Diagnosis not present

## 2021-03-16 DIAGNOSIS — E559 Vitamin D deficiency, unspecified: Secondary | ICD-10-CM | POA: Diagnosis not present

## 2021-03-16 DIAGNOSIS — F332 Major depressive disorder, recurrent severe without psychotic features: Secondary | ICD-10-CM | POA: Diagnosis not present

## 2021-03-16 DIAGNOSIS — J302 Other seasonal allergic rhinitis: Secondary | ICD-10-CM | POA: Diagnosis not present

## 2021-03-16 DIAGNOSIS — I6782 Cerebral ischemia: Secondary | ICD-10-CM | POA: Diagnosis not present

## 2021-03-16 DIAGNOSIS — I2510 Atherosclerotic heart disease of native coronary artery without angina pectoris: Secondary | ICD-10-CM | POA: Diagnosis not present

## 2021-03-16 DIAGNOSIS — E876 Hypokalemia: Secondary | ICD-10-CM | POA: Diagnosis not present

## 2021-03-16 DIAGNOSIS — R9431 Abnormal electrocardiogram [ECG] [EKG]: Secondary | ICD-10-CM | POA: Diagnosis not present

## 2021-03-17 ENCOUNTER — Encounter: Payer: Self-pay | Admitting: Psychiatry

## 2021-03-18 DIAGNOSIS — F332 Major depressive disorder, recurrent severe without psychotic features: Secondary | ICD-10-CM | POA: Diagnosis not present

## 2021-03-25 ENCOUNTER — Ambulatory Visit: Payer: Medicare Other | Admitting: Psychiatry

## 2021-04-03 ENCOUNTER — Other Ambulatory Visit: Payer: Self-pay

## 2021-04-03 DIAGNOSIS — I2510 Atherosclerotic heart disease of native coronary artery without angina pectoris: Secondary | ICD-10-CM

## 2021-04-03 MED ORDER — NITROGLYCERIN 0.4 MG/SPRAY TL SOLN: 1.0000 | 12 refills | Status: AC | PRN

## 2021-04-09 ENCOUNTER — Other Ambulatory Visit: Payer: Self-pay | Admitting: *Deleted

## 2021-04-09 ENCOUNTER — Encounter: Payer: Self-pay | Admitting: *Deleted

## 2021-04-09 DIAGNOSIS — I2510 Atherosclerotic heart disease of native coronary artery without angina pectoris: Secondary | ICD-10-CM | POA: Diagnosis not present

## 2021-04-09 DIAGNOSIS — I10 Essential (primary) hypertension: Secondary | ICD-10-CM | POA: Diagnosis not present

## 2021-04-09 DIAGNOSIS — B354 Tinea corporis: Secondary | ICD-10-CM | POA: Diagnosis not present

## 2021-04-09 DIAGNOSIS — F339 Major depressive disorder, recurrent, unspecified: Secondary | ICD-10-CM | POA: Diagnosis not present

## 2021-04-09 DIAGNOSIS — Z09 Encounter for follow-up examination after completed treatment for conditions other than malignant neoplasm: Secondary | ICD-10-CM | POA: Diagnosis not present

## 2021-04-09 DIAGNOSIS — E7800 Pure hypercholesterolemia, unspecified: Secondary | ICD-10-CM | POA: Diagnosis not present

## 2021-04-11 ENCOUNTER — Ambulatory Visit (INDEPENDENT_AMBULATORY_CARE_PROVIDER_SITE_OTHER): Payer: Medicare PPO | Admitting: Psychiatry

## 2021-04-11 ENCOUNTER — Encounter: Payer: Self-pay | Admitting: Psychiatry

## 2021-04-11 VITALS — BP 108/61 | HR 94 | Ht 70.0 in | Wt 193.0 lb

## 2021-04-11 DIAGNOSIS — R413 Other amnesia: Secondary | ICD-10-CM
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
## 2021-04-11 NOTE — Patient Instructions (Signed)
Referral for neuropsycholgical assessment (more extensive memory testing)

## 2021-04-15 ENCOUNTER — Telehealth: Payer: Self-pay | Admitting: Psychiatry

## 2021-04-15 NOTE — Telephone Encounter (Signed)
Neuropsychology referral sent to [facility]. Phone: [phone].

## 2021-04-17 NOTE — Telephone Encounter (Signed)
Patient is scheduled to see Dr. Eliezer Lofts Renfroe on 05/28/21.

## 2021-04-18 DIAGNOSIS — E7800 Pure hypercholesterolemia, unspecified: Secondary | ICD-10-CM | POA: Diagnosis not present

## 2021-04-18 DIAGNOSIS — L309 Dermatitis, unspecified: Secondary | ICD-10-CM | POA: Diagnosis not present

## 2021-04-18 DIAGNOSIS — E118 Type 2 diabetes mellitus with unspecified complications: Secondary | ICD-10-CM | POA: Diagnosis not present

## 2021-04-18 DIAGNOSIS — I2510 Atherosclerotic heart disease of native coronary artery without angina pectoris: Secondary | ICD-10-CM | POA: Diagnosis not present

## 2021-04-18 DIAGNOSIS — L409 Psoriasis, unspecified: Secondary | ICD-10-CM | POA: Diagnosis not present

## 2021-04-18 DIAGNOSIS — E039 Hypothyroidism, unspecified: Secondary | ICD-10-CM | POA: Diagnosis not present

## 2021-04-18 DIAGNOSIS — R208 Other disturbances of skin sensation: Secondary | ICD-10-CM | POA: Diagnosis not present

## 2021-04-18 DIAGNOSIS — Z09 Encounter for follow-up examination after completed treatment for conditions other than malignant neoplasm: Secondary | ICD-10-CM | POA: Diagnosis not present

## 2021-04-18 DIAGNOSIS — I10 Essential (primary) hypertension: Secondary | ICD-10-CM | POA: Diagnosis not present

## 2021-05-02 DIAGNOSIS — F332 Major depressive disorder, recurrent severe without psychotic features: Secondary | ICD-10-CM | POA: Diagnosis not present

## 2021-05-20 DIAGNOSIS — F332 Major depressive disorder, recurrent severe without psychotic features: Secondary | ICD-10-CM | POA: Diagnosis not present

## 2021-05-22 DIAGNOSIS — H524 Presbyopia: Secondary | ICD-10-CM | POA: Diagnosis not present

## 2021-05-22 DIAGNOSIS — H2513 Age-related nuclear cataract, bilateral: Secondary | ICD-10-CM | POA: Diagnosis not present

## 2021-05-22 DIAGNOSIS — H52223 Regular astigmatism, bilateral: Secondary | ICD-10-CM | POA: Diagnosis not present

## 2021-05-22 DIAGNOSIS — H5203 Hypermetropia, bilateral: Secondary | ICD-10-CM | POA: Diagnosis not present

## 2021-05-28 DIAGNOSIS — R413 Other amnesia: Secondary | ICD-10-CM | POA: Diagnosis not present

## 2021-06-03 DIAGNOSIS — F332 Major depressive disorder, recurrent severe without psychotic features: Secondary | ICD-10-CM | POA: Diagnosis not present

## 2021-06-05 ENCOUNTER — Telehealth: Payer: Self-pay | Admitting: *Deleted

## 2021-06-05 NOTE — Telephone Encounter (Signed)
Received fax form [facility] re: Dr Renfroe's office notes. Placed on MD desk for review.

## 2021-06-10 DIAGNOSIS — R413 Other amnesia: Secondary | ICD-10-CM | POA: Diagnosis not present

## 2021-06-24 DIAGNOSIS — F332 Major depressive disorder, recurrent severe without psychotic features: Secondary | ICD-10-CM | POA: Diagnosis not present

## 2021-06-28 ENCOUNTER — Encounter: Payer: Self-pay | Admitting: Cardiology

## 2021-06-28 ENCOUNTER — Ambulatory Visit: Payer: Medicare PPO | Admitting: Cardiology

## 2021-06-28 ENCOUNTER — Other Ambulatory Visit: Payer: Self-pay

## 2021-06-28 VITALS — BP 124/67 | HR 90 | Temp 98.6°F | Resp 16 | Ht 70.0 in | Wt 220.2 lb

## 2021-06-28 DIAGNOSIS — I10 Essential (primary) hypertension: Secondary | ICD-10-CM | POA: Diagnosis not present

## 2021-06-28 DIAGNOSIS — I453 Trifascicular block: Secondary | ICD-10-CM

## 2021-06-28 DIAGNOSIS — I2510 Atherosclerotic heart disease of native coronary artery without angina pectoris: Secondary | ICD-10-CM

## 2021-06-28 DIAGNOSIS — E7800 Pure hypercholesterolemia, unspecified: Secondary | ICD-10-CM

## 2021-06-28 NOTE — Progress Notes (Signed)
[facility]  Primary Physician/Referring:  Deland Pretty, MD  Patient ID: [name], male    DOB: 08-Oct-1940, 81 y.o.   MRN: [id number]  Chief Complaint  Patient presents with   Coronary Artery Disease   Hyperlipidemia   Follow-up    1 year   HPI:    HPI: [name]  is a 81 y.o. Caucasian male with past medical history of venous insufficiency with prior venous stasis ulcer, OSA on CPAP, hypertension, GERD, morbid obesity, trifascicular block, hypercholesterolemia and CAD S/P MI and stent to LAD in 1987 and again in 1997, repeat stenting to LAD stenosis with DES in April 2016 presents for annual visit. He has not had recurrence of angina.  This is annual visit, patient has had a mental meltdown and fortunately with psychotherapy and psychiatry, he has rebounded back.  He feels well, he denies any chest pain or dyspnea.     Past Medical History:  Diagnosis Date   Allergic asthma    Barrett's esophagus    BPH (benign prostatic hyperplasia)    CAD (coronary artery disease)    CAD (coronary atherosclerotic disease)    Confusion    COPD (chronic obstructive pulmonary disease) (HCC)    Diabetes (HCC)    GERD (gastroesophageal reflux disease)    Heart attack ([place])    x2   Hiatal hernia    Hyperlipidemia    Hypertension    Hypothyroidism    Irregular heart rhythm    Polypharmacy    Psoriasis    Sleep apnea    wears CPAP nightly   Past Surgical History:  Procedure Laterality Date   COLONOSCOPY  03/2017   CORONARY ANGIOPLASTY WITH STENT PLACEMENT  08/15/2014   PCI of pLAD Xience alpine DES   EYE SURGERY  01/2017   eyelids lifted   LEFT HEART CATHETERIZATION WITH CORONARY ANGIOGRAM N/A 08/15/2014   Procedure: LEFT HEART CATHETERIZATION WITH CORONARY ANGIOGRAM;  Surgeon: Peter M Martinique, MD;  Location: [facility] CATH LAB;  Service: Cardiovascular;  Laterality: N/A;   left knee arthroscopy Left 1994   NASAL POLYP EXCISION  1953   NASAL SINUS SURGERY     deviated septum    TONSILLECTOMY     TOTAL HIP ARTHROPLASTY Right 06/22/2017   Procedure: RIGHT TOTAL HIP ARTHROPLASTY ANTERIOR APPROACH;  Surgeon: Rod Can, MD;  Location: [place];  Service: Orthopedics;  Laterality: Right;  Needs RNFA   TOTAL HIP REVISION Right 07/10/2017   Procedure: RIGHT TOTAL HIP REVISION FEMORAL COMPONENT;  Surgeon: Rod Can, MD;  Location: [place];  Service: Orthopedics;  Laterality: Right;  Needs RNFA    Social History   Tobacco Use   Smoking status: Former    Packs/day: 3.00    Years: 33.00    Pack years: 99.00    Types: Pipe, Cigars, Cigarettes    Quit date: 04/28/1985    Years since quitting: 36.1   Smokeless tobacco: Never  Substance Use Topics   Alcohol use: Yes    Alcohol/week: 2.0 standard drinks    Types: 1 Glasses of wine, 1 Shots of liquor per week    Comment: rare   Marital Status: Married  ROS  Review of Systems  Cardiovascular:  Negative for chest pain, dyspnea on exertion and leg swelling.  Objective   Vitals with BMI 06/28/2021 04/11/2021 02/22/2021  Height _0  _1  -  Weight 220 lbs 3 oz 193 lbs -  BMI 41.9 37.90 -  Systolic 240 973 532  Diastolic  67 61 72  Pulse 90 94 84   Blood pressure 124/67, pulse 90, temperature 98.6 F (37 C), temperature source Temporal, resp. rate 16, height _0  (1.778 m), weight 220 lb 3.2 oz (99.9 kg), SpO2 98 %.     Physical Exam Constitutional:      Appearance: He is well-developed.     Comments: Well built   Cardiovascular:     Rate and Rhythm: Normal rate and regular rhythm. FrequentExtrasystoles are present.    Pulses: Normal pulses and intact distal pulses.     Heart sounds: S1 normal and S2 normal. Murmur heard.  Blowing decrescendo early diastolic murmur is present with a grade of 2/4 at the upper right sternal border radiating to the apex.  Pulmonary:     Effort: Pulmonary effort is normal. No accessory muscle usage or respiratory distress.     Breath sounds: Normal breath sounds.   Abdominal:     General: Abdomen is flat. Bowel sounds are normal.     Palpations: Abdomen is soft.  Musculoskeletal:     Cervical back: Normal range of motion.     Right lower leg: Edema (1-2+ pitting) present.     Left lower leg: Edema (1-2 plus pitting) present.    Laboratory examination:    External labs:  Labs 04/18/2021:  Total cholesterol 98, triglycerides 63, HDL 46, LDL 39.  TSH 0.23, mildly reduced (0.27-4.2).  A1c 5.2%.  Hb 14.6/HCT 44.2, platelets 142.  Normal indicis.  Potassium 4.3, BUN 9, creatinine 0.76, EGFR 85 mill, LFT normal Medications    Current Outpatient Medications:    acetaminophen (TYLENOL) 325 MG tablet, Take 650 mg by mouth every 6 (six) hours as needed for mild pain or headache., Disp: , Rfl:    albuterol (PROVENTIL HFA;VENTOLIN HFA) 108 (90 BASE) MCG/ACT inhaler, Inhale 1-2 puffs into the lungs every 6 (six) hours as needed for wheezing or shortness of breath., Disp: 1 Inhaler, Rfl: 11   ARIPiprazole (ABILIFY) 5 MG tablet, Take 5 mg by mouth daily., Disp: , Rfl:    aspirin EC 81 MG tablet, Take 81 mg by mouth daily., Disp: , Rfl:    atorvastatin (LIPITOR) 40 MG tablet, TAKE 1 TABLET BY MOUTH EVERY DAY IN THE EVENING (Patient taking differently: Take 40 mg by mouth daily.), Disp: 30 tablet, Rfl: 11   Cholecalciferol (VITAMIN D3) 1000 UNITS CAPS, Take 1,000 Units by mouth daily., Disp: , Rfl:    Cyanocobalamin (VITAMIN B-12) 5000 MCG TBDP, Take 5,000 mcg by mouth daily., Disp: , Rfl:    cycloSPORINE (RESTASIS) 0.05 % ophthalmic emulsion, 1 drop into affected eye, Disp: , Rfl:    docusate sodium (COLACE) 100 MG capsule, Take 100 mg by mouth daily., Disp: , Rfl:    ezetimibe (ZETIA) 10 MG tablet, Take 1 tablet (10 mg total) by mouth every evening., Disp: 90 tablet, Rfl: 3   fexofenadine (ALLEGRA) 180 MG tablet, Take 180 mg by mouth daily., Disp: , Rfl:    fluticasone (CUTIVATE) 0.05 % cream, Apply 1 application topically 2 (two) times daily., Disp: ,  Rfl: 2   fluticasone (FLONASE) 50 MCG/ACT nasal spray, Place into both nostrils daily., Disp: , Rfl:    hydrOXYzine (ATARAX) 25 MG tablet, Take 25 mg by mouth every 6 (six) hours as needed., Disp: , Rfl:    levothyroxine (SYNTHROID) 112 MCG tablet, Take 112 mcg by mouth daily before breakfast., Disp: , Rfl:    melatonin 5 MG TABS, 1 tablet at bedtime., Disp: ,  Rfl:    mirtazapine (REMERON) 15 MG tablet, Take 15 mg by mouth at bedtime., Disp: , Rfl:    Multiple Vitamin (MULTIVITAMIN) capsule, Take 1 capsule by mouth daily., Disp: , Rfl:    nitroGLYCERIN (NITROLINGUAL) 0.4 MG/SPRAY spray, Place 1 spray under the tongue every 5 (five) minutes x 3 doses as needed for chest pain. Use as directed as needed, Disp: 12 g, Rfl: 12   NON FORMULARY, CPAP machine every night, Disp: , Rfl:    Omega-3 1000 MG CAPS, , Disp: , Rfl:    pantoprazole (PROTONIX) 40 MG tablet, Take 30-60 min before first meal of the day (Patient taking differently: Take 40 mg by mouth daily before breakfast.), Disp: , Rfl:    pyridOXINE (B-6) 50 MG tablet, Take 50 mg by mouth daily., Disp: , Rfl:    thiamine 100 MG tablet, Take 100 mg by mouth daily., Disp: , Rfl:    Zinc 220 (50 Zn) MG CAPS, 220 mg daily., Disp: , Rfl:    KLOR-CON M10 10 MEQ tablet, TAKE 1 TABLET (10 MEQ TOTAL) BY MOUTH DAILY. PLEASE CALL TO SCHEDULE APPOINTMENT FOR FUTURE REFILLS. (Patient not taking: Reported on 06/28/2021), Disp: 90 tablet, Rfl: 3     Cardiac Studies:   Echo 09/30/2016: Normal LV systolic function, EF 89-38% with mild diffuse hypokinesis.  Grade 1 diastolic dysfunction.  Moderate left atrial enlargement, mitral annular calcification.  PCV MYOCARDIAL PERFUSION WITH LEXISCAN 04/16/2020 Lexiscan nuclear stress test performed using 1-day protocol. Stress EKG is non-diagnostic, as this is pharmacological stress test. In addition, stress EKG showed sinus rhythm, RBBB, LAFB, frequent PVCs and brief run of atrial tachycardia, no ischemic changes. Normal  myocardial perfusion. Stress LVEF 29%. High risk study due to low stress LVEF.  PCV ECHOCARDIOGRAM COMPLETE 07/03/2020  Narrative Echocardiogram 07/03/2020: Normal LV systolic function with visual EF 50-55%. Left ventricle cavity is normal in size. Normal global wall motion. Normal diastolic filling pattern, normal LAP. Mild (Grade I) aortic regurgitation. Trace tricuspid regurgitation. No evidence of pulmonary hypertension. Compared to prior study dated 09/30/2016: No significant change.     EKG EKG 06/28/2021: Sinus rhythm with first-degree block at rate of 87 bpm, left axis deviation, left anterior fascicular block.  Right bundle branch block.  Trifascicular block.  No evidence of ischemia.  No significant change from 03/29/2020.   Assessment     ICD-10-CM   1. Atherosclerosis of native coronary artery of native heart without angina pectoris  I25.10 EKG 12-Lead    2. Trifascicular block  I45.3     3. Primary hypertension  I10     4. Pure hypercholesterolemia  E78.00      No orders of the defined types were placed in this encounter.  Medications Discontinued During This Encounter  Medication Reason   OVER THE COUNTER MEDICATION    senna (SENOKOT) 8.6 MG tablet    traZODone (DESYREL) 50 MG tablet    ketoconazole (NIZORAL) 2 % cream    Orders Placed This Encounter  Procedures   EKG 12-Lead     Recommendations:   Mr. Cullin Dishman is a 81 y.o. Caucasian male with past medical history of venous insufficiency with prior venous stasis ulcer, OSA on CPAP, hypertension, GERD, morbid obesity, trifascicular block, hypercholesterolemia and CAD S/P MI and stent to LAD in 1987 and again in 1997, repeat stenting to LAD stenosis with DES in April 2016 presents for annual visit. He has not had recurrence of angina.  This is annual visit, patient has  had a mental meltdown and fortunately with psychotherapy and psychiatry, he has rebounded back.  He feels well, he denies any chest pain or  dyspnea.  His nuclear stress test again reviewed, he has normal perfusion, EF by echocardiogram is completely normal hence overall low risk with regard to CAD progression.  No change in the EKG, he has trifascicular block however has not had any fatigue, dyspnea, dizziness or syncope.  Continue monitoring.  Although during auscultation he had frequent ectopy, EKG did not reveal any frequent ectopy.  This is chronic for him.  Blood pressures well controlled.  External labs reviewed, lipids under excellent control, normal renal function and CBC.  No change in his physical exam with regard to aortic regurgitation murmur.  He has mild bilateral 1-2+ pitting edema that is chronic.  I will see him back on an annual basis.    Adrian Prows, PA-C 06/28/2021, 10:30 AM Office: [phone]

## 2021-06-28 NOTE — Progress Notes (Unsigned)
[facility]  Primary Physician/Referring:  Deland Pretty, MD  Patient ID: [name], male    DOB: February 13, 1941, 81 y.o.   MRN: [id number]  No chief complaint on file.  HPI:    HPI: [name]  is a 81 y.o. Caucasian male with past medical history of venous insufficiency with prior venous stasis ulcer, OSA on CPAP, hypertension, hypercholesterolemia, CAD S/P MI and stent to LAD in 1987 and again in 1997, repeat stenting to LAD stenosis with DES in April 2016.   Patient presents for 7-month follow-up of PVCs and hyperlipidemia.  Overall patient is presently doing well without specific complaints today.  He has had no recurrence of anginal symptoms and has not needed sublingual nitroglycerin.  He continues to lose weight since starting Ozempic a total of approximately 50 pounds.  He has resumed taking Lipitor, and is tolerating this well.  He remains compliant with CPAP.  Patient denies angina, dyspnea, leg swelling, palpitations, dizziness, syncope, near syncope.  Patient denies orthopnea, PND.  He does continue to suffer from chronic back pain.  Notably patient does report that Ozempic causes him constipation, therefore patient stops Ozempic for 1 week approximately every 6 weeks until his movements have regulated, then he resumes Ozempic.   Past Medical History:  Diagnosis Date   Allergic asthma    Barrett's esophagus    BPH (benign prostatic hyperplasia)    CAD (coronary artery disease)    CAD (coronary atherosclerotic disease)    Confusion    COPD (chronic obstructive pulmonary disease) (HCC)    Diabetes (HCC)    GERD (gastroesophageal reflux disease)    Heart attack ([place])    x2   Hiatal hernia    Hyperlipidemia    Hypertension    Hypothyroidism    Irregular heart rhythm    Polypharmacy    Psoriasis    Sleep apnea    wears CPAP nightly    Social History   Tobacco Use   Smoking status: Former    Packs/day: 3.00    Years: 33.00    Pack years: 99.00    Types:  Pipe, Cigars, Cigarettes    Quit date: 04/28/1985    Years since quitting: 36.1   Smokeless tobacco: Never  Substance Use Topics   Alcohol use: Yes    Alcohol/week: 2.0 standard drinks    Types: 1 Glasses of wine, 1 Shots of liquor per week    Comment: rare   Marital Status: Married  ROS  Review of Systems  Cardiovascular:  Negative for chest pain, dyspnea on exertion and leg swelling.  Objective   Vitals with BMI 04/11/2021 02/22/2021 02/22/2021  Height 5\' 10"  - -  Weight 193 lbs - -  BMI 03.54 - -  Systolic 656 812 751  Diastolic 61 72 72  Pulse 94 84 84   There were no vitals taken for this visit.     Physical Exam Neck:     Vascular: No JVD.  Cardiovascular:     Rate and Rhythm: Normal rate and regular rhythm.     Pulses: Intact distal pulses.     Heart sounds: Normal heart sounds. No murmur heard.   No gallop.  Pulmonary:     Effort: Pulmonary effort is normal.     Breath sounds: Normal breath sounds.  Abdominal:     General: Bowel sounds are normal.     Palpations: Abdomen is soft.  Musculoskeletal:     Right lower leg: No edema.  Left lower leg: No edema.    Laboratory examination:   Recent Labs    01/29/21 0304 02/19/21 1539  NA 139 139  K 3.4* 3.9  CL 103 106  CO2 27 25  GLUCOSE 103* 110*  BUN 9 9  CREATININE 0.85 0.89  CALCIUM 9.3 9.2  GFRNONAA >60 >60    CMP Latest Ref Rng & Units 02/19/2021 01/29/2021 05/28/2020  Glucose 70 - 99 mg/dL 110(H) 103(H) 104(H)  BUN 8 - 23 mg/dL 9 9 7(L)  Creatinine 0.61 - 1.24 mg/dL 0.89 0.85 0.82  Sodium 135 - 145 mmol/L 139 139 142  Potassium 3.5 - 5.1 mmol/L 3.9 3.4(L) 3.9  Chloride 98 - 111 mmol/L 106 103 108  CO2 22 - 32 mmol/L 25 27 28   Calcium 8.9 - 10.3 mg/dL 9.2 9.3 9.2  Total Protein 6.5 - 8.1 g/dL 6.3(L) 6.4(L) 6.8  Total Bilirubin 0.3 - 1.2 mg/dL 1.5(H) 1.1 0.7  Alkaline Phos 38 - 126 U/L 72 69 87  AST 15 - 41 U/L 24 39 15  ALT 0 - 44 U/L 31 43 21   CBC Latest Ref Rng & Units 02/19/2021  01/29/2021 05/28/2020  WBC 4.0 - 10.5 K/uL 4.6 4.0 5.3  Hemoglobin 13.0 - 17.0 g/dL 15.0 15.3 16.1  Hematocrit 39.0 - 52.0 % 44.2 43.5 48.6  Platelets 150 - 400 K/uL 162 190 168   External labs:  Labs 04/18/2021:  A1c 5.2%.  TSH mildly reduced at 0.23 (0.27-4.2)  Total cholesterol 98, triglycerides 63, HDL 46, LDL 39.  Hb 14.6/HCT 44.2, platelets 142.  Normal indicis.  Sodium 143, potassium 4.3, BUN 9, creatinine 0.76, LFTs normal, GFR 85.  Medications    Current Outpatient Medications:    acetaminophen (TYLENOL) 325 MG tablet, Take 650 mg by mouth every 6 (six) hours as needed for mild pain or headache. (Patient not taking: Reported on 04/11/2021), Disp: , Rfl:    albuterol (PROVENTIL HFA;VENTOLIN HFA) 108 (90 BASE) MCG/ACT inhaler, Inhale 1-2 puffs into the lungs every 6 (six) hours as needed for wheezing or shortness of breath., Disp: 1 Inhaler, Rfl: 11   ARIPiprazole (ABILIFY) 5 MG tablet, Take 5 mg by mouth daily., Disp: , Rfl:    aspirin EC 81 MG tablet, Take 81 mg by mouth daily., Disp: , Rfl:    atorvastatin (LIPITOR) 40 MG tablet, TAKE 1 TABLET BY MOUTH EVERY DAY IN THE EVENING (Patient taking differently: Take 40 mg by mouth daily.), Disp: 30 tablet, Rfl: 11   Cholecalciferol (VITAMIN D3) 1000 UNITS CAPS, Take 1,000 Units by mouth daily., Disp: , Rfl:    Cyanocobalamin (VITAMIN B-12) 5000 MCG TBDP, Take 5,000 mcg by mouth daily.  (Patient not taking: Reported on 04/11/2021), Disp: , Rfl:    cycloSPORINE (RESTASIS) 0.05 % ophthalmic emulsion, 1 drop into affected eye, Disp: , Rfl:    docusate sodium (COLACE) 100 MG capsule, Take 100 mg by mouth daily., Disp: , Rfl:    ezetimibe (ZETIA) 10 MG tablet, Take 1 tablet (10 mg total) by mouth every evening., Disp: 90 tablet, Rfl: 3   fexofenadine (ALLEGRA) 180 MG tablet, Take 180 mg by mouth daily. (Patient not taking: Reported on 04/11/2021), Disp: , Rfl:    fluticasone (CUTIVATE) 0.05 % cream, Apply 1 application topically 2 (two)  times daily.  (Patient not taking: Reported on 04/11/2021), Disp: , Rfl: 2   fluticasone (FLONASE) 50 MCG/ACT nasal spray, Place into both nostrils daily., Disp: , Rfl:    hydrOXYzine (ATARAX) 25 MG  tablet, Take 25 mg by mouth every 6 (six) hours as needed., Disp: , Rfl:    ketoconazole (NIZORAL) 2 % cream, Apply 1 application topically daily., Disp: , Rfl:    KLOR-CON M10 10 MEQ tablet, TAKE 1 TABLET (10 MEQ TOTAL) BY MOUTH DAILY. PLEASE CALL TO SCHEDULE APPOINTMENT FOR FUTURE REFILLS. (Patient not taking: Reported on 02/19/2021), Disp: 90 tablet, Rfl: 3   levothyroxine (SYNTHROID) 112 MCG tablet, Take 112 mcg by mouth daily before breakfast., Disp: , Rfl:    melatonin 5 MG TABS, 1 tablet at bedtime., Disp: , Rfl:    mirtazapine (REMERON) 15 MG tablet, Take 15 mg by mouth at bedtime., Disp: , Rfl:    Multiple Vitamin (MULTIVITAMIN) capsule, Take 1 capsule by mouth daily., Disp: , Rfl:    nitroGLYCERIN (NITROLINGUAL) 0.4 MG/SPRAY spray, Place 1 spray under the tongue every 5 (five) minutes x 3 doses as needed for chest pain. Use as directed as needed, Disp: 12 g, Rfl: 12   NON FORMULARY, CPAP machine every night (Patient not taking: Reported on 04/11/2021), Disp: , Rfl:    Omega-3 1000 MG CAPS, Take 1000 mg by mouth once daily (Patient not taking: Reported on 02/19/2021), Disp: , Rfl:    OVER THE COUNTER MEDICATION, Take 1 tablet by mouth daily. Prostate Plus Supplement - "[place] on Steroids" (Patient not taking: Reported on 04/11/2021), Disp: , Rfl:    pantoprazole (PROTONIX) 40 MG tablet, Take 30-60 min before first meal of the day (Patient taking differently: Take 40 mg by mouth daily before breakfast.), Disp: , Rfl:    pyridOXINE (B-6) 50 MG tablet, Take 50 mg by mouth daily., Disp: , Rfl:    senna (SENOKOT) 8.6 MG tablet, Take 1 tablet by mouth daily., Disp: , Rfl:    thiamine 100 MG tablet, Take 100 mg by mouth daily., Disp: , Rfl:    traZODone (DESYREL) 50 MG tablet, Take 1 tablet by mouth  daily. (Patient not taking: Reported on 04/11/2021), Disp: , Rfl:    Zinc 220 (50 Zn) MG CAPS, 220 mg daily., Disp: , Rfl:     Cardiac Studies:   Echo 09/30/2016: Normal LV systolic function, EF 75-64% with mild diffuse hypokinesis.  Grade 1 diastolic dysfunction.  Moderate left atrial enlargement, mitral annular calcification.  PCV MYOCARDIAL PERFUSION WITH LEXISCAN 04/16/2020 Lexiscan nuclear stress test performed using 1-day protocol. Stress EKG is non-diagnostic, as this is pharmacological stress test. In addition, stress EKG showed sinus rhythm, RBBB, LAFB, frequent PVCs and brief run of atrial tachycardia, no ischemic changes. Normal myocardial perfusion. Stress LVEF 29%. High risk study due to low stress LVEF.  Repeat echocardiogram pending - scheduled for 07/03/2020  EKG  EKG 03/29/2020: Sinus rhythm with first-degree block at rate of 96 bpm, left atrial enlargement, left axis deviation, left intrafascicular block.  Right bundle branch block.  Trifascicular block.  Frequent PVCs in a quadrigeminy pattern.  Compared to 06/17/2018, frequent PVCs new.  Assessment     ICD-10-CM   1. Atherosclerosis of native coronary artery of native heart without angina pectoris  I25.10     2. Trifascicular block  I45.3     3. Primary hypertension  I10     4. Pure hypercholesterolemia  E78.00      No orders of the defined types were placed in this encounter.  There are no discontinued medications. No orders of the defined types were placed in this encounter.   Recommendations:   [name] is a 81 y.o. Caucasian  male with past medical history of venous insufficiency with prior venous stasis ulcer, OSA on CPAP, hypertension, GERD, morbid obesity, trifascicular block, hypercholesterolemia and CAD S/P MI and stent to LAD in 1987 and again in 1997, repeat stenting to LAD stenosis with DES in April 2016 presents for annual visit. He has not had recurrence of angina.  Patient presents for  88-month follow-up of hyperlipidemia and PVCs.  Patient is presently on Lipitor and Zetia, which she is tolerating well.  I have personally reviewed external labs, lipids are under excellent control.  On physical exam, patient continues to have frequent PVCs, although he remains asymptomatic.  There are no clinical signs of heart failure.  Discussed and reviewed with patient results of nuclear stress test, details above, revealing normal myocardial perfusion with low stress LVEF.  Patient is currently scheduled for echocardiogram in our office next week.  We will further evaluate significance of low stress LVEF InContext when echocardiogram results are available, I have advised patient of this.   Patient's blood pressure remains well controlled.  He continues to lose weight with Ozempic, approximately 50 pounds since starting the medication.  Weight loss has significantly improved patient's overall wellbeing.  Advised patient to discuss constipation add Ozempic with PCP, he verbalized agreement.  Notably patient does have underlying trifascicular block, however no indication for pacemaker at this time as he remains asymptomatic.  Follow-up in 1 year, sooner if needed, for CAD and hyperlipidemia unless significant abnormalities noted on pending echocardiogram.    Adrian Prows, MD, [facility] 06/28/2021, 9:23 AM Office: [phone] Fax: [phone] Pager: [phone]

## 2021-07-09 DIAGNOSIS — F332 Major depressive disorder, recurrent severe without psychotic features: Secondary | ICD-10-CM | POA: Diagnosis not present

## 2021-07-17 DIAGNOSIS — F332 Major depressive disorder, recurrent severe without psychotic features: Secondary | ICD-10-CM | POA: Diagnosis not present

## 2021-07-18 DIAGNOSIS — E0590 Thyrotoxicosis, unspecified without thyrotoxic crisis or storm: Secondary | ICD-10-CM | POA: Diagnosis not present

## 2021-07-18 DIAGNOSIS — I10 Essential (primary) hypertension: Secondary | ICD-10-CM | POA: Diagnosis not present

## 2021-07-18 DIAGNOSIS — Z0000 Encounter for general adult medical examination without abnormal findings: Secondary | ICD-10-CM | POA: Diagnosis not present

## 2021-07-18 DIAGNOSIS — J449 Chronic obstructive pulmonary disease, unspecified: Secondary | ICD-10-CM | POA: Diagnosis not present

## 2021-07-18 DIAGNOSIS — I2510 Atherosclerotic heart disease of native coronary artery without angina pectoris: Secondary | ICD-10-CM | POA: Diagnosis not present

## 2021-07-23 DIAGNOSIS — M5416 Radiculopathy, lumbar region: Secondary | ICD-10-CM | POA: Diagnosis not present

## 2021-07-24 DIAGNOSIS — F332 Major depressive disorder, recurrent severe without psychotic features: Secondary | ICD-10-CM | POA: Diagnosis not present

## 2021-08-07 ENCOUNTER — Ambulatory Visit (INDEPENDENT_AMBULATORY_CARE_PROVIDER_SITE_OTHER): Payer: Medicare PPO | Admitting: Pulmonary Disease

## 2021-08-07 ENCOUNTER — Encounter: Payer: Self-pay | Admitting: Pulmonary Disease

## 2021-08-07 VITALS — BP 126/80 | HR 87 | Temp 97.6°F | Ht 70.0 in | Wt 225.4 lb

## 2021-08-07 DIAGNOSIS — J432 Centrilobular emphysema: Secondary | ICD-10-CM

## 2021-08-07 DIAGNOSIS — Z9989 Dependence on other enabling machines and devices: Secondary | ICD-10-CM

## 2021-08-07 DIAGNOSIS — G4733 Obstructive sleep apnea (adult) (pediatric): Secondary | ICD-10-CM

## 2021-08-07 NOTE — Patient Instructions (Signed)
Schedule for home sleep study ? ?Schedule for PFT ? ?Call with significant concerns ? ?Continue using his CPAP ? ?I will see you in about 2 months ?

## 2021-08-07 NOTE — Progress Notes (Signed)
? ?      ?[name]    [id number]    1941-01-24 ? ?Primary Care Physician:Pharr, Thayer Jew, MD ? ?Referring Physician: Deland Pretty, MD ?[place],   [id number] ? ?Chief complaint:   ?Evaluation for sleep apnea ? ?HPI: ? ?Patient diagnosed with sleep apnea many years ago ?Has been using CPAP regularly ? ?Uses CPAP nightly ? ?He does remember his dreams in the morning, does not appear to have vivid dreams ?Usually goes to bed about 11 PM, wakes up at 7 AM, falls asleep easily ?3-4 awakenings ?Usually tries to get out of bed by 7:15 AM ? ?He is retired-[employer] ?Reformed smoker quit in 1996 ? ?At some point was told about COPD ? ?He uses a nebulizer as needed ? ?Has not had any significant problems with his breathing ? ?History of depression with associated hospitalization about November December October November 2022 ? ?Admits to some dryness of his mouth in the mornings, no headache ?He does have his mouth open during sleep ? ?Overall feeling well ? ?Outpatient Encounter Medications as of 08/07/2021  ?Medication Sig  ? acetaminophen (TYLENOL) 325 MG tablet Take 650 mg by mouth every 6 (six) hours as needed for mild pain or headache.  ? albuterol (PROVENTIL HFA;VENTOLIN HFA) 108 (90 BASE) MCG/ACT inhaler Inhale 1-2 puffs into the lungs every 6 (six) hours as needed for wheezing or shortness of breath.  ? ARIPiprazole (ABILIFY) 5 MG tablet Take 5 mg by mouth daily.  ? aspirin EC 81 MG tablet Take 81 mg by mouth daily.  ? atorvastatin (LIPITOR) 40 MG tablet TAKE 1 TABLET BY MOUTH EVERY DAY IN THE EVENING (Patient taking differently: Take 40 mg by mouth daily.)  ? Cholecalciferol (VITAMIN D3) 1000 UNITS CAPS Take 1,000 Units by mouth daily.  ? Cyanocobalamin (VITAMIN B-12) 5000 MCG TBDP Take 5,000 mcg by mouth daily.  ? cycloSPORINE (RESTASIS) 0.05 % ophthalmic emulsion 1 drop into affected eye  ? docusate sodium (COLACE) 100 MG capsule Take 100 mg by mouth daily.  ?  ezetimibe (ZETIA) 10 MG tablet Take 1 tablet (10 mg total) by mouth every evening.  ? fexofenadine (ALLEGRA) 180 MG tablet Take 180 mg by mouth daily.  ? fluticasone (CUTIVATE) 0.05 % cream Apply 1 application topically 2 (two) times daily.  ? fluticasone (FLONASE) 50 MCG/ACT nasal spray Place into both nostrils daily.  ? hydrOXYzine (ATARAX) 25 MG tablet Take 25 mg by mouth every 6 (six) hours as needed.  ? KLOR-CON M10 10 MEQ tablet TAKE 1 TABLET (10 MEQ TOTAL) BY MOUTH DAILY. PLEASE CALL TO SCHEDULE APPOINTMENT FOR FUTURE REFILLS.  ? levothyroxine (SYNTHROID) 88 MCG tablet Take 88 mcg by mouth daily.  ? melatonin 5 MG TABS 1 tablet at bedtime.  ? mirtazapine (REMERON) 15 MG tablet Take 15 mg by mouth at bedtime.  ? Multiple Vitamin (MULTIVITAMIN) capsule Take 1 capsule by mouth daily.  ? nitroGLYCERIN (NITROLINGUAL) 0.4 MG/SPRAY spray Place 1 spray under the tongue every 5 (five) minutes x 3 doses as needed for chest pain. Use as directed as needed  ? NON FORMULARY CPAP machine every night  ? Omega-3 1000 MG CAPS   ? pantoprazole (PROTONIX) 40 MG tablet Take 30-60 min before first meal of the day (Patient taking differently: Take 40 mg by mouth daily before breakfast.)  ? pyridOXINE (B-6) 50 MG tablet Take 50 mg by mouth daily.  ? thiamine 100 MG tablet Take 100 mg by mouth daily.  ?  Zinc 220 (50 Zn) MG CAPS 220 mg daily.  ? [DISCONTINUED] levothyroxine (SYNTHROID) 112 MCG tablet Take 112 mcg by mouth daily before breakfast.  ? ?No facility-administered encounter medications on file as of 08/07/2021.  ? ? ?Allergies as of 08/07/2021  ? (No Known Allergies)  ? ? ?Past Medical History:  ?Diagnosis Date  ? Allergic asthma   ? Barrett's esophagus   ? BPH (benign prostatic hyperplasia)   ? CAD (coronary artery disease)   ? CAD (coronary atherosclerotic disease)   ? Confusion   ? COPD (chronic obstructive pulmonary disease) ([place])   ? Diabetes ([place])   ? GERD (gastroesophageal reflux disease)   ? Heart attack [facility])   ? x2   ? Hiatal hernia   ? Hyperlipidemia   ? Hypertension   ? Hypothyroidism   ? Irregular heart rhythm   ? Polypharmacy   ? Psoriasis   ? Sleep apnea   ? wears CPAP nightly  ? ? ?Past Surgical History:  ?Procedure Laterality Date  ? COLONOSCOPY  03/2017  ? CORONARY ANGIOPLASTY WITH STENT PLACEMENT  08/15/2014  ? PCI of pLAD Xience alpine DES  ? EYE SURGERY  01/2017  ? eyelids lifted  ? LEFT HEART CATHETERIZATION WITH CORONARY ANGIOGRAM N/A 08/15/2014  ? Procedure: LEFT HEART CATHETERIZATION WITH CORONARY ANGIOGRAM;  Surgeon: Peter M Martinique, MD;  Location: [facility] CATH LAB;  Service: Cardiovascular;  Laterality: N/A;  ? left knee arthroscopy Left 1994  ? NASAL POLYP EXCISION  1953  ? NASAL SINUS SURGERY    ? deviated septum  ? TONSILLECTOMY    ? TOTAL HIP ARTHROPLASTY Right 06/22/2017  ? Procedure: RIGHT TOTAL HIP ARTHROPLASTY ANTERIOR APPROACH;  Surgeon: Rod Can, MD;  Location: [place];  Service: Orthopedics;  Laterality: Right;  Needs RNFA  ? TOTAL HIP REVISION Right 07/10/2017  ? Procedure: RIGHT TOTAL HIP REVISION FEMORAL COMPONENT;  Surgeon: Rod Can, MD;  Location: [place];  Service: Orthopedics;  Laterality: Right;  Needs RNFA  ? ? ?Family History  ?Problem Relation Age of Onset  ? Dementia Mother   ? COPD Mother   ? Tuberculosis Mother   ? Lung cancer Father   ? Suicidality Father   ? Other Father   ?     Suicide  ? ? ?Social History  ? ?Socioeconomic History  ? Marital status: Married  ?  Spouse name: Not on file  ? Number of children: 2  ? Years of education: post grad  ? Highest education level: Not on file  ?Occupational History  ? Occupation: Company secretary  ?  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 Smoking status: Former  ?  Packs/day: 3.00  ?  Years: 33.00  ?  Pack years: 99.00  ?  Types: Pipe, Cigars, Cigarettes  ?  Quit date: 04/28/1985  ?  Years since quitting: 36.3  ? Smokeless tobacco: Never  ?Vaping Use  ? Vaping Use: Never used  ?Substance and Sexual Activity  ? Alcohol use: Yes  ?  Alcohol/week:  2.0 standard drinks  ?  Types: 1 Glasses of wine, 1 Shots of liquor per week  ?  Comment: rare  ? Drug use: No  ? Sexual activity: Not on file  ?Other Topics Concern  ? Not on file  ?Social History Narrative  ? 04/11/21 Lives with wife  ? Coffee, tea  ? ?Social Determinants of Health  ? ?Financial Resource Strain: Not on file  ?Food Insecurity: Not on file  ?Transportation Needs: Not on  file  ?Physical Activity: Not on file  ?Stress: Not on file  ?Social Connections: Not on file  ?Intimate Partner Violence: Not on file  ? ? ?Review of Systems  ?Respiratory:  Positive for apnea.   ?Psychiatric/Behavioral:  Positive for sleep disturbance.   ? ?Vitals:  ? 08/07/21 1034  ?BP: 126/80  ?Pulse: 87  ?Temp: 97.6 ?F (36.4 ?C)  ?SpO2: 97%  ? ? ? ?Physical Exam ?Constitutional:   ?   Appearance: He is obese.  ?HENT:  ?   Head: Normocephalic.  ?   Right Ear: Tympanic membrane normal.  ?   Mouth/Throat:  ?   Mouth: Mucous membranes are moist.  ?Cardiovascular:  ?   Rate and Rhythm: Normal rate.  ?   Heart sounds: No murmur heard. ?  No friction rub.  ?Pulmonary:  ?   Effort: No respiratory distress.  ?   Breath sounds: No stridor. No wheezing or rhonchi.  ?Musculoskeletal:  ?   Cervical back: No rigidity or tenderness.  ?Neurological:  ?   Mental Status: He is alert.  ?Psychiatric:     ?   Mood and Affect: Mood normal.  ? ? ?  08/07/2021  ? 10:00 AM  ?Results of the Epworth flowsheet  ?Sitting and reading 2  ?Watching TV 3  ?Sitting, inactive in a public place (e.g. a theatre or a meeting) 0  ?As a passenger in a car for an hour without a break 0  ?Lying down to rest in the afternoon when circumstances permit 3  ?Sitting and talking to someone 0  ?Sitting quietly after a lunch without alcohol 0  ?In a car, while stopped for a few minutes in traffic 0  ?Total score 8  ? ? ?Data Reviewed: ?Previous sleep study not available ?No download from his machine, does not have a card in the machine ?Previous spirometry from 2013 in 2014 with  mild obstructive disease ? ?He did bring his machine to the office and I did look to the machine appears that he is set on pressure settings of 5-20 ? ?Assessment:  ?History of obstructive sleep apnea ?-Sev

## 2021-08-25 DIAGNOSIS — E039 Hypothyroidism, unspecified: Secondary | ICD-10-CM | POA: Diagnosis not present

## 2021-08-25 DIAGNOSIS — E118 Type 2 diabetes mellitus with unspecified complications: Secondary | ICD-10-CM | POA: Diagnosis not present

## 2021-08-25 DIAGNOSIS — I10 Essential (primary) hypertension: Secondary | ICD-10-CM | POA: Diagnosis not present

## 2021-08-25 DIAGNOSIS — E7800 Pure hypercholesterolemia, unspecified: Secondary | ICD-10-CM | POA: Diagnosis not present

## 2021-08-26 ENCOUNTER — Other Ambulatory Visit: Payer: Self-pay | Admitting: Cardiology

## 2021-08-26 DIAGNOSIS — E7800 Pure hypercholesterolemia, unspecified: Secondary | ICD-10-CM

## 2021-09-12 ENCOUNTER — Ambulatory Visit: Payer: Medicare PPO

## 2021-09-12 DIAGNOSIS — G4733 Obstructive sleep apnea (adult) (pediatric): Secondary | ICD-10-CM

## 2021-09-18 DIAGNOSIS — H2512 Age-related nuclear cataract, left eye: Secondary | ICD-10-CM | POA: Diagnosis not present

## 2021-09-21 ENCOUNTER — Telehealth: Payer: Self-pay | Admitting: Pulmonary Disease

## 2021-09-21 DIAGNOSIS — G4733 Obstructive sleep apnea (adult) (pediatric): Secondary | ICD-10-CM | POA: Diagnosis not present

## 2021-09-21 NOTE — Telephone Encounter (Signed)
Call patient  Sleep study result  Date of study: 09/12/2021  Impression: Mild obstructive sleep apnea Mild oxygen desaturations  Recommendation: Options of treatment for mild obstructive sleep apnea will include  Patient already has a diagnosis of obstructive sleep apnea on CPAP therapy Continue CPAP therapy  -If CPAP is chosen as an option of treatment auto titrating CPAP with a pressure setting of 5-15 will be appropriate  2.  Watchful waiting with emphasis on weight loss measures, sleep position modification to optimize lateral sleep, elevating the head of the bed by about 30 degrees may also help.  3.  An oral device may be fashioned for the treatment of mild sleep disordered breathing, will involve referral to dentist.  Follow-up as previously scheduled

## 2021-09-24 NOTE — Telephone Encounter (Signed)
I attempted to leave a message and was not able to due to VM being full.

## 2021-09-26 HISTORY — PX: CATARACT EXTRACTION: SUR2

## 2021-09-30 DIAGNOSIS — F332 Major depressive disorder, recurrent severe without psychotic features: Secondary | ICD-10-CM | POA: Diagnosis not present

## 2021-10-01 DIAGNOSIS — L409 Psoriasis, unspecified: Secondary | ICD-10-CM | POA: Diagnosis not present

## 2021-10-02 DIAGNOSIS — M5416 Radiculopathy, lumbar region: Secondary | ICD-10-CM | POA: Diagnosis not present

## 2021-10-02 DIAGNOSIS — M5412 Radiculopathy, cervical region: Secondary | ICD-10-CM | POA: Diagnosis not present

## 2021-10-02 DIAGNOSIS — M5030 Other cervical disc degeneration, unspecified cervical region: Secondary | ICD-10-CM | POA: Diagnosis not present

## 2021-10-02 DIAGNOSIS — M5136 Other intervertebral disc degeneration, lumbar region: Secondary | ICD-10-CM | POA: Diagnosis not present

## 2021-10-04 DIAGNOSIS — H2512 Age-related nuclear cataract, left eye: Secondary | ICD-10-CM | POA: Diagnosis not present

## 2021-10-04 DIAGNOSIS — J449 Chronic obstructive pulmonary disease, unspecified: Secondary | ICD-10-CM | POA: Diagnosis not present

## 2021-10-04 NOTE — Telephone Encounter (Signed)
Patient has an OV 10/07/21 to go over results.

## 2021-10-07 ENCOUNTER — Ambulatory Visit (INDEPENDENT_AMBULATORY_CARE_PROVIDER_SITE_OTHER): Payer: Medicare PPO | Admitting: Pulmonary Disease

## 2021-10-07 ENCOUNTER — Encounter: Payer: Self-pay | Admitting: Pulmonary Disease

## 2021-10-07 VITALS — BP 122/76 | HR 91 | Ht 70.0 in | Wt 225.0 lb

## 2021-10-07 DIAGNOSIS — Z9989 Dependence on other enabling machines and devices: Secondary | ICD-10-CM

## 2021-10-07 DIAGNOSIS — J432 Centrilobular emphysema: Secondary | ICD-10-CM

## 2021-10-07 DIAGNOSIS — G4733 Obstructive sleep apnea (adult) (pediatric): Secondary | ICD-10-CM | POA: Diagnosis not present

## 2021-10-07 DIAGNOSIS — R0602 Shortness of breath: Secondary | ICD-10-CM | POA: Diagnosis not present

## 2021-10-07 LAB — PULMONARY FUNCTION TEST
DL/VA % pred: 148 %
DL/VA: 5.72 ml/min/mmHg/L
DLCO cor % pred: 143 %
DLCO cor: 35.57 ml/min/mmHg
DLCO unc % pred: 143 %
DLCO unc: 35.57 ml/min/mmHg
FEF 25-75 Post: 1.74 L/sec
FEF 25-75 Pre: 0.85 L/sec
FEF2575-%Change-Post: 104 %
FEF2575-%Pred-Post: 88 %
FEF2575-%Pred-Pre: 43 %
FEV1-%Change-Post: 92 %
FEV1-%Pred-Post: 70 %
FEV1-%Pred-Pre: 36 %
FEV1-Post: 2.05 L
FEV1-Pre: 1.07 L
FEV1FVC-%Change-Post: 64 %
FEV1FVC-%Pred-Pre: 52 %
FEV6-%Change-Post: 16 %
FEV6-%Pred-Post: 84 %
FEV6-%Pred-Pre: 72 %
FEV6-Post: 3.23 L
FEV6-Pre: 2.77 L
FEV6FVC-%Change-Post: 0 %
FEV6FVC-%Pred-Post: 107 %
FEV6FVC-%Pred-Pre: 107 %
FVC-%Change-Post: 17 %
FVC-%Pred-Post: 80 %
FVC-%Pred-Pre: 68 %
FVC-Post: 3.32 L
FVC-Pre: 2.83 L
Post FEV1/FVC ratio: 62 %
Post FEV6/FVC ratio: 100 %
Pre FEV1/FVC ratio: 38 %
Pre FEV6/FVC Ratio: 100 %
RV % pred: 117 %
RV: 3.23 L
TLC % pred: 101 %
TLC: 7.39 L

## 2021-10-07 NOTE — Progress Notes (Signed)
[name]    [id number]    11-13-1940  Primary Care Physician:Pharr, Thayer Jew, MD  Referring Physician: Deland Pretty, MD [address] 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   albuterol (PROVENTIL HFA;VENTOLIN HFA) 108 (90 BASE) MCG/ACT inhaler Inhale 1-2 puffs into the lungs every 6 (six) hours as needed for wheezing or shortness of breath.   ARIPiprazole (ABILIFY) 5 MG tablet Take 5 mg by mouth daily.   aspirin EC 81 MG tablet Take 81 mg by mouth daily.   atorvastatin (LIPITOR) 40 MG tablet TAKE 1 TABLET BY MOUTH EVERY DAY IN THE EVENING   Cholecalciferol (VITAMIN D3) 1000 UNITS CAPS Take 1,000 Units by mouth daily.   Cyanocobalamin (VITAMIN B-12) 5000 MCG TBDP Take 5,000 mcg by mouth daily.   cycloSPORINE (RESTASIS) 0.05 % ophthalmic emulsion 1 drop into affected eye   docusate sodium (COLACE) 100 MG capsule Take 100 mg by mouth every other day.   ezetimibe (ZETIA) 10  MG tablet Take 1 tablet (10 mg total) by mouth every evening.   fexofenadine (ALLEGRA) 180 MG tablet Take 180 mg by mouth daily.   fluticasone (CUTIVATE) 0.05 % cream Apply 1 application topically 2 (two) times daily.   fluticasone (FLONASE) 50 MCG/ACT nasal spray Place into both nostrils daily.   KLOR-CON M10 10 MEQ tablet TAKE 1 TABLET (10 MEQ TOTAL) BY MOUTH DAILY. PLEASE CALL TO SCHEDULE APPOINTMENT FOR FUTURE REFILLS.   levothyroxine (SYNTHROID) 88 MCG tablet Take 88 mcg by mouth daily.   melatonin 5 MG TABS 1 tablet at bedtime.   mirtazapine (REMERON) 15 MG tablet Take 15 mg by mouth at bedtime.   Multiple Vitamin (MULTIVITAMIN) capsule Take 1 capsule by mouth daily.   nitroGLYCERIN (NITROLINGUAL) 0.4 MG/SPRAY spray Place 1 spray under the tongue every 5 (five) minutes x 3 doses as needed for chest pain. Use as directed as needed   NON FORMULARY CPAP machine every night   ofloxacin (OCUFLOX) 0.3 % ophthalmic solution Place 1 drop into the left eye 4 (four) times daily.   Omega-3 1000 MG CAPS    pantoprazole (PROTONIX) 40 MG tablet Take 30-60 min before first meal of the day (Patient taking differently: Take 40 mg by mouth daily before breakfast.)   prednisoLONE acetate (PRED FORTE) 1 % ophthalmic suspension Place into the left eye.   pyridOXINE (B-6) 50 MG tablet Take 50 mg by mouth daily.   [DISCONTINUED] hydrOXYzine (ATARAX)  25 MG tablet Take 25 mg by mouth every 6 (six) hours as needed. (Patient not taking: Reported on 10/07/2021)   [DISCONTINUED] thiamine 100 MG tablet Take 100 mg by mouth daily. (Patient not taking: Reported on 10/07/2021)   [DISCONTINUED] Zinc 220 (50 Zn) MG CAPS 220 mg daily. (Patient not taking: Reported on 10/07/2021)   No facility-administered encounter medications on file as of 10/07/2021.    Allergies as of 10/07/2021   (No Known Allergies)    Past Medical History:  Diagnosis Date   Allergic asthma    Barrett's esophagus    BPH (benign prostatic  hyperplasia)    CAD (coronary artery disease)    CAD (coronary atherosclerotic disease)    Confusion    COPD (chronic obstructive pulmonary disease) ([facility])    Diabetes ([facility])    GERD (gastroesophageal reflux disease)    Heart attack ([place])    x2   Hiatal hernia    Hyperlipidemia    Hypertension    Hypothyroidism    Irregular heart rhythm    Polypharmacy    Psoriasis    Sleep apnea    wears CPAP nightly    Past Surgical History:  Procedure Laterality Date   COLONOSCOPY  03/2017   CORONARY ANGIOPLASTY WITH STENT PLACEMENT  08/15/2014   PCI of pLAD Xience alpine DES   EYE SURGERY  01/2017   eyelids lifted   LEFT HEART CATHETERIZATION WITH CORONARY ANGIOGRAM N/A 08/15/2014   Procedure: LEFT HEART CATHETERIZATION WITH CORONARY ANGIOGRAM;  Surgeon: Peter M Martinique, MD;  [facility];  Service: Cardiovascular;  Laterality: N/A;   left knee arthroscopy Left 1994   NASAL POLYP EXCISION  1953   NASAL SINUS SURGERY     deviated septum   TONSILLECTOMY     TOTAL HIP ARTHROPLASTY Right 06/22/2017   Procedure: RIGHT TOTAL HIP ARTHROPLASTY ANTERIOR APPROACH;  Surgeon: Rod Can, MD;  Location: [place];  Service: Orthopedics;  Laterality: Right;  Needs RNFA   TOTAL HIP REVISION Right 07/10/2017   Procedure: RIGHT TOTAL HIP REVISION FEMORAL COMPONENT;  Surgeon: Rod Can, MD;  Location: [place];  Service: Orthopedics;  Laterality: Right;  Needs RNFA    Family History  Problem Relation Age of Onset   Dementia Mother    COPD Mother    Tuberculosis Mother    Lung cancer Father    Suicidality Father    Other Father        Suicide    Social History   Socioeconomic History   Marital status: Married    Spouse name: Not on file   Number of children: 2   Years of education: post grad   Highest education level: Not on file  Occupational History   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  Tobacco Use   Smoking status: Former    Packs/day: 3.00    Years: 33.00     Total pack years: 99.00    Types: Pipe, Cigars, Cigarettes    Quit date: 04/28/1985    Years since quitting: 36.4   Smokeless tobacco: Never  Vaping Use   Vaping Use: Never used  Substance and Sexual Activity   Alcohol use: Yes    Alcohol/week: 2.0 standard drinks of alcohol    Types: 1 Glasses of wine, 1 Shots of liquor per week    Comment: rare   Drug use: No   Sexual activity: Not on file  Other Topics Concern   Not on file  Social History Narrative  04/11/21 Lives with wife   Coffee, tea   Social Determinants of Health   Financial Resource Strain: Not on file  Food Insecurity: Not on file  Transportation Needs: Not on file  Physical Activity: Not on file  Stress: Not on file  Social Connections: Not on file  Intimate Partner Violence: Not on file    Review of Systems  Respiratory:  Positive for apnea.   Psychiatric/Behavioral:  Positive for sleep disturbance.     Vitals:   10/07/21 1347  BP: 122/76  Pulse: 91  SpO2: 96%     Physical Exam Constitutional:      Appearance: He is obese.  HENT:     Mouth/Throat:     Mouth: Mucous membranes are moist.  Eyes:     Pupils: Pupils are equal, round, and reactive to light.  Cardiovascular:     Rate and Rhythm: Normal rate.     Heart sounds: No murmur heard.    No friction rub.  Pulmonary:     Effort: No respiratory distress.     Breath sounds: No stridor. No wheezing or rhonchi.  Musculoskeletal:     Cervical back: No rigidity or tenderness.  Neurological:     Mental Status: He is alert.  Psychiatric:        Mood and Affect: Mood normal.       08/07/2021   10:00 AM  Results of the Epworth flowsheet  Sitting and reading 2  Watching TV 3  Sitting, inactive in a public place (e.g. a theatre or a meeting) 0  As a passenger in a car for an hour without a break 0  Lying down to rest in the afternoon when circumstances permit 3  Sitting and talking to someone 0  Sitting quietly after a lunch without  alcohol 0  In a car, while stopped for a few minutes in traffic 0  Total score 8    Data Reviewed: Previous sleep study not available No download obtainable from his machine since he does not have a card and it  Recent home sleep study did reveal mild obstructive sleep apnea with an AHI of 10.6, mild oxygen desaturations  Assessment:  History of obstructive sleep apnea -Mild obstructive sleep apnea  History of chronic obstructive pulmonary disease -As needed use of nebulization treatments -No recent spirometry  PFT with severe obstructive disease with significant bronchodilator response  History of coronary artery disease -Appears stable  History of depression -Controlled  Daytime sleepiness  Plan/Recommendations: DME referral for CPAP and CPAP supplies  Auto CPAP with pressure settings of 5-15  Graded activities as tolerated  Tentative follow-up in about 3 months  Use of bronchodilators as needed  Sherrilyn Rist MD [facility] 10/07/2021, 1:53 PM  CC: Deland Pretty, MD

## 2021-10-07 NOTE — Progress Notes (Signed)
Full PFT Performed Today  

## 2021-10-07 NOTE — Patient Instructions (Signed)
Mild obstructive sleep apnea on recent sleep study  Inhaler use/breathing treatment for shortness of breath  Your breathing study did reveal severe obstructive lung disease, very responsive to bronchodilators  Graded activities as tolerated  Encourage weight loss efforts  DME referral for auto CPAP 5-15 with heated humidification  Preference for a fullface mask

## 2021-10-07 NOTE — Patient Instructions (Signed)
Full PFT Performed Today  

## 2021-10-07 NOTE — Addendum Note (Signed)
Addended by: Lonzo Cloud on: 10/07/2021 02:35 PM   Modules accepted: Orders

## 2021-10-10 ENCOUNTER — Ambulatory Visit (INDEPENDENT_AMBULATORY_CARE_PROVIDER_SITE_OTHER): Payer: Medicare PPO | Admitting: Psychiatry

## 2021-10-10 ENCOUNTER — Encounter: Payer: Self-pay | Admitting: Psychiatry

## 2021-10-10 VITALS — BP 117/74 | HR 87 | Ht 70.0 in | Wt 234.6 lb

## 2021-10-10 DIAGNOSIS — R413 Other amnesia: Secondary | ICD-10-CM | POA: Diagnosis not present

## 2021-10-10 NOTE — Progress Notes (Signed)
   CC:  memory loss  Follow-up Visit  Last visit: 04/11/21  Brief HPI: 81 year old male with a history of CAD, hypothyroidism, OSA, depression who follows in clinic for memory loss. Brain MRI with mild chronic small vessel disease and mild cerebral atrophy.  Interval History: He saw neuropsychology in February 2023. Testing did not show evidence of a neurodegenerative process. Memory issues were suspected to be secondary to untreated OSA and mood dysregulation with psychotic features.  His mood has been "a whole lot better" recently. He is following with psychiatry and seeing them once per month. He has followed up with pulmonology and is waiting for a new CPAP machine.  He does continue to have word finding difficulty and is worried his memory is gradually fading. He is frustrated that he did not get a perfect score on MMSE today.  Physical Exam:   Vital Signs: BP 117/74   Pulse 87   Ht '5\' 10"'$  (0.569 m)   Wt 234 lb 9.6 oz (106.4 kg)   BMI 33.66 kg/m  GENERAL:  well appearing, in no acute distress, alert  SKIN:  Color, texture, turgor normal. No rashes or lesions HEAD:  Normocephalic/atraumatic. RESP: normal respiratory effort MSK:  No gross joint deformities.   NEUROLOGICAL:    10/10/2021    8:42 AM 04/11/2021    9:29 AM  MMSE - Mini Mental State Exam  Orientation to time 5 5  Orientation to Place 4 5  Registration 3 3  Attention/ Calculation 4 5  Recall 3 3  Language- name 2 objects 2 2  Language- repeat 1 1  Language- follow 3 step command 3 3  Language- read & follow direction 1 1  Write a sentence 1 1  Copy design 1 1  Total score 28 30   Cranial Nerves: PERRL, face symmetric, no dysarthria, hearing grossly intact Motor: moves all extremities equally Gait: normal-based.  IMPRESSION: 81 year old male with a history of CAD, hypothyroidism, OSA, depression who presents for follow up of memory loss. Neuropsychological testing was not suggestive of any  neurodegenerative condition. MMSE within normal limits today. He continues to be anxious about memory loss. Discussed general brain health measures including mood management, exercise, and regular sleep. He plans to start swimming soon and is currently waiting on new CPAP. No further testing needed at this time. Will have him return if he develops new memory concerns.  PLAN: -Discussed general brain health measures -Continue to follow with Psychiatry for mood management -Continue to follow with Pulmonology for OSA -Return to clinic if new memory concerns develop  Follow-up: as needed  I spent a total of 31 minutes on the date of the service. Discussed medication side effects, adverse reactions and drug interactions. Written educational materials and patient instructions outlining all of the above were given.  Genia Harold, MD 10/10/21 9:25 AM

## 2021-10-10 NOTE — Patient Instructions (Addendum)
  Tasks to improve attention/working memory 1. Good sleep hygiene (7-8 hrs of sleep), using CPAP at night 2. Learning a new skill (Painting, Carpentry, Pottery, new language, Knitting). 3.Cognitive exercises (keep a daily journal, Puzzles) 4. Physical exercise and training  (30 min/day X 4 days week) 5. Being on Antidepressant if needed 6.Yoga, Meditation, Tai Chi 7. Decrease alcohol intake 8.Have a clear schedule and structure in daily routine

## 2021-10-15 DIAGNOSIS — M5416 Radiculopathy, lumbar region: Secondary | ICD-10-CM | POA: Diagnosis not present

## 2021-11-01 DIAGNOSIS — H2511 Age-related nuclear cataract, right eye: Secondary | ICD-10-CM | POA: Diagnosis not present

## 2021-11-01 DIAGNOSIS — H269 Unspecified cataract: Secondary | ICD-10-CM | POA: Diagnosis not present

## 2021-11-07 DIAGNOSIS — F332 Major depressive disorder, recurrent severe without psychotic features: Secondary | ICD-10-CM | POA: Diagnosis not present

## 2021-11-07 DIAGNOSIS — M47812 Spondylosis without myelopathy or radiculopathy, cervical region: Secondary | ICD-10-CM | POA: Diagnosis not present

## 2021-11-07 DIAGNOSIS — M5416 Radiculopathy, lumbar region: Secondary | ICD-10-CM | POA: Diagnosis not present

## 2021-11-07 DIAGNOSIS — Z5181 Encounter for therapeutic drug level monitoring: Secondary | ICD-10-CM | POA: Diagnosis not present

## 2021-11-07 DIAGNOSIS — M5459 Other low back pain: Secondary | ICD-10-CM | POA: Diagnosis not present

## 2021-12-12 DIAGNOSIS — F332 Major depressive disorder, recurrent severe without psychotic features: Secondary | ICD-10-CM | POA: Diagnosis not present

## 2021-12-17 DIAGNOSIS — F339 Major depressive disorder, recurrent, unspecified: Secondary | ICD-10-CM | POA: Diagnosis not present

## 2021-12-17 DIAGNOSIS — R5383 Other fatigue: Secondary | ICD-10-CM | POA: Diagnosis not present

## 2021-12-17 DIAGNOSIS — G479 Sleep disorder, unspecified: Secondary | ICD-10-CM | POA: Diagnosis not present

## 2022-01-01 DIAGNOSIS — R400 Somnolence: Secondary | ICD-10-CM | POA: Diagnosis not present

## 2022-01-01 DIAGNOSIS — E039 Hypothyroidism, unspecified: Secondary | ICD-10-CM | POA: Diagnosis not present

## 2022-01-06 DIAGNOSIS — F332 Major depressive disorder, recurrent severe without psychotic features: Secondary | ICD-10-CM | POA: Diagnosis not present

## 2022-01-13 ENCOUNTER — Encounter: Payer: Self-pay | Admitting: Pulmonary Disease

## 2022-01-13 ENCOUNTER — Ambulatory Visit (INDEPENDENT_AMBULATORY_CARE_PROVIDER_SITE_OTHER): Payer: Medicare PPO | Admitting: Pulmonary Disease

## 2022-01-13 VITALS — BP 108/74 | HR 78 | Temp 97.9°F | Ht 70.0 in | Wt 241.6 lb

## 2022-01-13 DIAGNOSIS — R5383 Other fatigue: Secondary | ICD-10-CM

## 2022-01-13 LAB — TSH: TSH: 8.79 u[IU]/mL — ABNORMAL HIGH (ref 0.35–5.50)

## 2022-01-13 NOTE — Progress Notes (Signed)
[name]    [id number]    Feb 15, 1941  Primary Care Physician:Pharr, Thayer Jew, MD  Referring Physician: Deland Pretty, MD [address] [place] [employer] [facility] [employer],  [place] [address]  Chief complaint:   Evaluation for sleep apnea Known to have obstructive sleep apnea  HPI:  Diagnosed many years ago with obstructive sleep apnea Recently had a home sleep study showing mild obstructive sleep apnea  Has been using his new CPAP Feels he is benefiting from a new CPAP  Still has daytime sleepiness and fatigue Has not been very active  He does feel good whenever he wakes up in the morning following using his CPAP  Denies any chronic cough, denies shortness of breath at rest  History of obstructive lung disease he does use a nebulizer only as needed   He is retired-[employer] Reformed smoker quit in [place] not had any significant problems with his breathing  History of depression with associated hospitalization about November December October November 2022  Admits to some dryness of his mouth in the mornings, no headache He does have his mouth open during sleep  Overall feeling well  Outpatient Encounter Medications as of 01/13/2022  Medication Sig   acetaminophen (TYLENOL) 325 MG tablet Take 650 mg by mouth every 6 (six) hours as needed for mild pain or headache.   albuterol (PROVENTIL HFA;VENTOLIN HFA) 108 (90 BASE) MCG/ACT inhaler Inhale 1-2 puffs into the lungs every 6 (six) hours as needed for wheezing or shortness of breath.   ARIPiprazole (ABILIFY) 5 MG tablet Take 5 mg by mouth daily.   aspirin EC 81 MG tablet Take 81 mg by mouth daily.   atorvastatin (LIPITOR) 40 MG tablet TAKE 1 TABLET BY MOUTH EVERY DAY IN THE EVENING   Cholecalciferol (VITAMIN D3) 1000 UNITS CAPS Take 1,000 Units by mouth daily.   Cyanocobalamin (VITAMIN B-12) 5000 MCG TBDP Take 5,000 mcg by mouth daily.   cycloSPORINE (RESTASIS) 0.05 % ophthalmic emulsion 1 drop into affected  eye   docusate sodium (COLACE) 100 MG capsule Take 100 mg by mouth every other day.   ezetimibe (ZETIA) 10 MG tablet Take 1 tablet (10 mg total) by mouth every evening.   fexofenadine (ALLEGRA) 180 MG tablet Take 180 mg by mouth daily.   fluticasone (CUTIVATE) 0.05 % cream Apply 1 application topically 2 (two) times daily.   fluticasone (FLONASE) 50 MCG/ACT nasal spray Place into both nostrils daily.   KLOR-CON M10 10 MEQ tablet TAKE 1 TABLET (10 MEQ TOTAL) BY MOUTH DAILY. PLEASE CALL TO SCHEDULE APPOINTMENT FOR FUTURE REFILLS.   levothyroxine (SYNTHROID) 88 MCG tablet Take 88 mcg by mouth daily.   melatonin 5 MG TABS 1 tablet at bedtime.   mirtazapine (REMERON) 15 MG tablet Take 15 mg by mouth at bedtime.   Multiple Vitamin (MULTIVITAMIN) capsule Take 1 capsule by mouth daily.   nitroGLYCERIN (NITROLINGUAL) 0.4 MG/SPRAY spray Place 1 spray under the tongue every 5 (five) minutes x 3 doses as needed for chest pain. Use as directed as needed   NON FORMULARY CPAP machine every night   ofloxacin (OCUFLOX) 0.3 % ophthalmic solution Place 1 drop into the left eye 4 (four) times daily.   Omega-3 1000 MG CAPS    pantoprazole (PROTONIX) 40 MG tablet Take 30-60 min before first meal of the day (Patient taking differently: Take 40 mg by mouth daily before breakfast.)   prednisoLONE acetate (PRED FORTE) 1 % ophthalmic suspension Place into  the left eye.   pyridOXINE (B-6) 50 MG tablet Take 50 mg by mouth daily.   No facility-administered encounter medications on file as of 01/13/2022.    Allergies as of 01/13/2022   (No Known Allergies)    Past Medical History:  Diagnosis Date   Allergic asthma    Barrett's esophagus    BPH (benign prostatic hyperplasia)    CAD (coronary artery disease)    CAD (coronary atherosclerotic disease)    Confusion    COPD (chronic obstructive pulmonary disease) (HCC)    Diabetes (HCC)    GERD (gastroesophageal reflux disease)    Heart attack ([place])    x2   Hiatal  hernia    Hyperlipidemia    Hypertension    Hypothyroidism    Irregular heart rhythm    Polypharmacy    Psoriasis    Sleep apnea    wears CPAP nightly    Past Surgical History:  Procedure Laterality Date   CATARACT EXTRACTION Left 09/2021   COLONOSCOPY  03/2017   CORONARY ANGIOPLASTY WITH STENT PLACEMENT  08/15/2014   PCI of pLAD Xience alpine DES   EYE SURGERY  01/2017   eyelids lifted   LEFT HEART CATHETERIZATION WITH CORONARY ANGIOGRAM N/A 08/15/2014   Procedure: LEFT HEART CATHETERIZATION WITH CORONARY ANGIOGRAM;  Surgeon: Peter M Martinique, MD;  Location: [facility] CATH LAB;  Service: Cardiovascular;  Laterality: N/A;   left knee arthroscopy Left 1994   NASAL POLYP EXCISION  1953   NASAL SINUS SURGERY     deviated septum   TONSILLECTOMY     TOTAL HIP ARTHROPLASTY Right 06/22/2017   Procedure: RIGHT TOTAL HIP ARTHROPLASTY ANTERIOR APPROACH;  Surgeon: Rod Can, MD;  Location: [facility];  Service: Orthopedics;  Laterality: Right;  Needs RNFA   TOTAL HIP REVISION Right 07/10/2017   Procedure: RIGHT TOTAL HIP REVISION FEMORAL COMPONENT;  Surgeon: Rod Can, MD;  Location: [facility];  Service: Orthopedics;  Laterality: Right;  Needs RNFA    Family History  Problem Relation Age of Onset   Dementia Mother    COPD Mother    Tuberculosis Mother    Lung cancer Father    Suicidality Father    Other Father        Suicide    Social History   Socioeconomic History   Marital status: Married    Spouse name: Lelon Frohlich   Number of children: 2   Years of education: post grad   Highest education level: Not on file  Occupational History   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  Tobacco Use   Smoking status: Former    Packs/day: 3.00    Years: 33.00    Total pack years: 99.00    Types: Pipe, Cigars, Cigarettes    Quit date: 04/28/1985    Years since quitting: 36.7   Smokeless tobacco: Never  Vaping Use   Vaping Use: Never used  Substance and Sexual Activity   Alcohol use:  Yes    Alcohol/week: 2.0 standard drinks of alcohol    Types: 1 Glasses of wine, 1 Shots of liquor per week    Comment: rare   Drug use: No   Sexual activity: Not on file  Other Topics Concern   Not on file  Social History Narrative   04/11/21 Lives with wife   Coffee, tea   Social Determinants of Health   Financial Resource Strain: Not on file  Food Insecurity: Not on file  Transportation Needs: Not on file  Physical Activity: Not on file  Stress: Not on file  Social Connections: Not on file  Intimate Partner Violence: Not on file    Review of Systems  Respiratory:  Positive for apnea.   Psychiatric/Behavioral:  Positive for sleep disturbance.     There were no vitals filed for this visit.    Physical Exam Constitutional:      Appearance: He is obese.  HENT:     Head: Normocephalic.     Mouth/Throat:     Mouth: Mucous membranes are moist.  Eyes:     Pupils: Pupils are equal, round, and reactive to light.  Cardiovascular:     Rate and Rhythm: Normal rate.     Heart sounds: No murmur heard.    No friction rub.  Pulmonary:     Effort: No respiratory distress.     Breath sounds: No stridor. No wheezing or rhonchi.  Musculoskeletal:     Cervical back: No rigidity or tenderness.  Neurological:     Mental Status: He is alert.  Psychiatric:        Mood and Affect: Mood normal.       08/07/2021   10:00 AM  Results of the Epworth flowsheet  Sitting and reading 2  Watching TV 3  Sitting, inactive in a public place (e.g. a theatre or a meeting) 0  As a passenger in a car for an hour without a break 0  Lying down to rest in the afternoon when circumstances permit 3  Sitting and talking to someone 0  Sitting quietly after a lunch without alcohol 0  In a car, while stopped for a few minutes in traffic 0  Total score 8    Data Reviewed: Previous sleep study not available No download obtainable from his machine since he does not have a card and it  Recent  home sleep study did reveal mild obstructive sleep apnea with an AHI of 10.6, mild oxygen desaturations  Assessment:  History of obstructive sleep apnea -Mild obstructive sleep apnea  Most recent download is not available  History of obstructive lung disease -Nebulization treatments as needed  Severe obstructive airway disease on recent PFT  History of coronary artery disease -Appears stable  History of depression -Controlled  Daytime sleepiness  Plan/Recommendations: Continue auto CPAP of 5-15  Graded activities as tolerated  Tentative follow-up in about 3 months  Use of bronchodilators as needed  Sherrilyn Rist MD [facility] 01/13/2022, 11:11 AM  CC: Deland Pretty, MD

## 2022-01-13 NOTE — Patient Instructions (Signed)
Check thyroid function test TSH and T4  Graded exercises as tolerated  Continue using CPAP on a nightly basis  Call us with significant concerns  I will see you back in about 3 months

## 2022-01-14 LAB — T3: T3, Total: 83 ng/dL (ref 76–181)

## 2022-01-14 LAB — T4: T4, Total: 6 ug/dL (ref 4.9–10.5)

## 2022-02-17 DIAGNOSIS — E039 Hypothyroidism, unspecified: Secondary | ICD-10-CM | POA: Diagnosis not present

## 2022-02-18 DIAGNOSIS — Z23 Encounter for immunization: Secondary | ICD-10-CM | POA: Diagnosis not present

## 2022-02-18 DIAGNOSIS — E039 Hypothyroidism, unspecified: Secondary | ICD-10-CM | POA: Diagnosis not present

## 2022-02-25 DIAGNOSIS — J209 Acute bronchitis, unspecified: Secondary | ICD-10-CM | POA: Diagnosis not present

## 2022-03-04 ENCOUNTER — Other Ambulatory Visit: Payer: Self-pay

## 2022-03-04 ENCOUNTER — Encounter (HOSPITAL_BASED_OUTPATIENT_CLINIC_OR_DEPARTMENT_OTHER): Payer: Self-pay

## 2022-03-04 ENCOUNTER — Emergency Department (HOSPITAL_BASED_OUTPATIENT_CLINIC_OR_DEPARTMENT_OTHER)
Admission: EM | Admit: 2022-03-04 | Discharge: 2022-03-04 | Disposition: A | Discharge status: Home / Self Care | Payer: Medicare PPO | Attending: Emergency Medicine | Admitting: Emergency Medicine

## 2022-03-04 DIAGNOSIS — M436 Torticollis: Secondary | ICD-10-CM | POA: Diagnosis not present

## 2022-03-04 DIAGNOSIS — E039 Hypothyroidism, unspecified: Secondary | ICD-10-CM | POA: Insufficient documentation

## 2022-03-04 DIAGNOSIS — Z96641 Presence of right artificial hip joint: Secondary | ICD-10-CM | POA: Insufficient documentation

## 2022-03-04 DIAGNOSIS — Z87891 Personal history of nicotine dependence: Secondary | ICD-10-CM | POA: Insufficient documentation

## 2022-03-04 DIAGNOSIS — M542 Cervicalgia: Secondary | ICD-10-CM | POA: Diagnosis present

## 2022-03-04 DIAGNOSIS — I10 Essential (primary) hypertension: Secondary | ICD-10-CM | POA: Insufficient documentation

## 2022-03-04 DIAGNOSIS — J449 Chronic obstructive pulmonary disease, unspecified: Secondary | ICD-10-CM | POA: Insufficient documentation

## 2022-03-04 DIAGNOSIS — I2510 Atherosclerotic heart disease of native coronary artery without angina pectoris: Secondary | ICD-10-CM | POA: Insufficient documentation

## 2022-03-04 DIAGNOSIS — E119 Type 2 diabetes mellitus without complications: Secondary | ICD-10-CM | POA: Insufficient documentation

## 2022-03-04 LAB — CBC
HCT: 47 % (ref 39.0–52.0)
Hemoglobin: 16.1 g/dL (ref 13.0–17.0)
MCH: 34.8 pg — ABNORMAL HIGH (ref 26.0–34.0)
MCHC: 34.3 g/dL (ref 30.0–36.0)
MCV: 101.7 fL — ABNORMAL HIGH (ref 80.0–100.0)
Platelets: 210 10*3/uL (ref 150–400)
RBC: 4.62 MIL/uL (ref 4.22–5.81)
RDW: 13.2 % (ref 11.5–15.5)
WBC: 8.1 10*3/uL (ref 4.0–10.5)
nRBC: 0 % (ref 0.0–0.2)

## 2022-03-04 LAB — BASIC METABOLIC PANEL
Anion gap: 9 (ref 5–15)
BUN: 12 mg/dL (ref 8–23)
CO2: 27 mmol/L (ref 22–32)
Calcium: 9.6 mg/dL (ref 8.9–10.3)
Chloride: 103 mmol/L (ref 98–111)
Creatinine, Ser: 1 mg/dL (ref 0.61–1.24)
GFR, Estimated: >60 mL/min (ref >60)
Glucose, Bld: 103 mg/dL — ABNORMAL HIGH (ref 70–99)
Potassium: 4.5 mmol/L (ref 3.5–5.1)
Sodium: 139 mmol/L (ref 135–145)

## 2022-03-04 MED ORDER — NAPROXEN 500 MG PO TABS: 500.0000 mg | ORAL_TABLET | Freq: Two times a day (BID) | ORAL | 0 refills | Status: AC

## 2022-03-04 MED ORDER — METHOCARBAMOL 500 MG PO TABS: 500.0000 mg | ORAL_TABLET | Freq: Three times a day (TID) | ORAL | 0 refills | Status: DC | PRN

## 2022-03-04 MED ORDER — NAPROXEN 250 MG PO TABS
500.0000 mg | ORAL_TABLET | Freq: Once | ORAL | Status: AC
  Administered 2022-03-04: 500 mg via ORAL
  Filled 2022-03-04: qty 2

## 2022-03-04 MED ORDER — LIDOCAINE 5 % EX PTCH: 1.0000 | MEDICATED_PATCH | CUTANEOUS | 0 refills | Status: DC

## 2022-03-04 NOTE — ED Triage Notes (Signed)
Pt states that he had a sinus infection last month and "started a series of events." Pt reports neck pain with movement, neck stiffness since noon today. PCP wants pt worked up for meningitis.

## 2022-03-04 NOTE — Discharge Instructions (Signed)
You were evaluated in the Emergency Department and after careful evaluation, we did not find any emergent condition requiring admission or further testing in the hospital.  Your exam/testing today was overall reassuring.  Take the meds provided for pain as we discussed and follow up with PCP.  Please return to the Emergency Department if you experience any worsening of your condition.  Thank you for allowing [place] to be a part of your care.

## 2022-03-04 NOTE — ED Provider Notes (Signed)
[facility] Emergency Department Provider Note MRN:  [id number]  Arrival date & time: 03/04/22     Chief Complaint   Torticollis   History of Present Illness   [name] is a 81 y.o. year-old male with a history of CAD presenting to the ED with chief complaint of neck pain.  Bilateral neck pain for 1-2 weeks.  Stiff and hurts to move.  No fever.  Review of Systems  A thorough review of systems was obtained and all systems are negative except as noted in the HPI and PMH.   Patient's Health History    Past Medical History:  Diagnosis Date   Allergic asthma    Barrett's esophagus    BPH (benign prostatic hyperplasia)    CAD (coronary artery disease)    CAD (coronary atherosclerotic disease)    Confusion    COPD (chronic obstructive pulmonary disease) (HCC)    Diabetes (HCC)    GERD (gastroesophageal reflux disease)    Heart attack ([place])    x2   Hiatal hernia    Hyperlipidemia    Hypertension    Hypothyroidism    Irregular heart rhythm    Polypharmacy    Psoriasis    Sleep apnea    wears CPAP nightly    Past Surgical History:  Procedure Laterality Date   CATARACT EXTRACTION Left 09/2021   COLONOSCOPY  03/2017   CORONARY ANGIOPLASTY WITH STENT PLACEMENT  08/15/2014   PCI of pLAD Xience alpine DES   EYE SURGERY  01/2017   eyelids lifted   LEFT HEART CATHETERIZATION WITH CORONARY ANGIOGRAM N/A 08/15/2014   Procedure: LEFT HEART CATHETERIZATION WITH CORONARY ANGIOGRAM;  Surgeon: Peter M Martinique, MD;  Location: [facility] CATH LAB;  Service: Cardiovascular;  Laterality: N/A;   left knee arthroscopy Left 1994   NASAL POLYP EXCISION  1953   NASAL SINUS SURGERY     deviated septum   TONSILLECTOMY     TOTAL HIP ARTHROPLASTY Right 06/22/2017   Procedure: RIGHT TOTAL HIP ARTHROPLASTY ANTERIOR APPROACH;  Surgeon: Rod Can, MD;  Location: [facility];  Service: Orthopedics;  Laterality: Right;  Needs RNFA   TOTAL HIP REVISION Right 07/10/2017    Procedure: RIGHT TOTAL HIP REVISION FEMORAL COMPONENT;  Surgeon: Rod Can, MD;  Location: [facility];  Service: Orthopedics;  Laterality: Right;  Needs RNFA    Family History  Problem Relation Age of Onset   Dementia Mother    COPD Mother    Tuberculosis Mother    Lung cancer Father    Suicidality Father    Other Father        Suicide    Social History   Socioeconomic History   Marital status: Married    Spouse name: Lelon Frohlich   Number of children: 2   Years of education: post grad   Highest education level: Not on file  Occupational History   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  Tobacco Use   Smoking status: Former    Packs/day: 3.00    Years: 33.00    Total pack years: 99.00    Types: Pipe, Cigars, Cigarettes    Quit date: 04/28/1985    Years since quitting: 36.8   Smokeless tobacco: Never  Vaping Use   Vaping Use: Never used  Substance and Sexual Activity   Alcohol use: Yes    Alcohol/week: 2.0 standard drinks of alcohol    Types: 1 Glasses of wine, 1 Shots of liquor per week  Comment: rare   Drug use: No   Sexual activity: Not on file  Other Topics Concern   Not on file  Social History Narrative   04/11/21 Lives with wife   Coffee, tea   Social Determinants of Health   Financial Resource Strain: Not on file  Food Insecurity: Not on file  Transportation Needs: Not on file  Physical Activity: Not on file  Stress: Not on file  Social Connections: Not on file  Intimate Partner Violence: Not on file     Physical Exam   Vitals:   03/04/22 1739  BP: (!) 117/103  Pulse: (!) 43  Resp: 18  Temp: 98.2 F (36.8 C)  SpO2: 100%    CONSTITUTIONAL:  well-appearing, NAD NEURO/PSYCH:  Alert and oriented x 3, normal strength and sensation, coordination, speech, gait, no meningismus EYES:  eyes equal and reactive ENT/NECK:  no LAD, no JVD CARDIO:  regular rate, well-perfused, normal S1 and S2 PULM:  CTAB no wheezing or rhonchi GI/GU:  non-distended,  non-tender MSK/SPINE:  No gross deformities, no edema SKIN:  no rash, atraumatic   *Additional and/or pertinent findings included in MDM below  Diagnostic and Interventional Summary    EKG Interpretation  Date/Time:    Ventricular Rate:    PR Interval:    QRS Duration:   QT Interval:    QTC Calculation:   R Axis:     Text Interpretation:         Labs Reviewed  BASIC METABOLIC PANEL - Abnormal; Notable for the following components:      Result Value   Glucose, Bld 103 (*)    All other components within normal limits  CBC - Abnormal; Notable for the following components:   MCV 101.7 (*)    MCH 34.8 (*)    All other components within normal limits    No orders to display    Medications  naproxen (NAPROSYN) tablet 500 mg (has no administration in time range)     Procedures  /  Critical Care Procedures  ED Course and Medical Decision Making  Initial Impression and Ddx Exam consistent with muscle strain or spasm.  No meningismus, nothing to suggest meningitis.  No signs or symptoms or myelopathy.  No indication for imaging.  Labs reassuring.  Past medical/surgical history that increases complexity of ED encounter:  CAD  Interpretation of Diagnostics N/A  Patient Reassessment and Ultimate Disposition/Management     discharge  Patient management required discussion with the following services or consulting groups:  None  Complexity of Problems Addressed Acute illness or injury that poses threat of life of bodily function  Additional Data Reviewed and Analyzed Further history obtained from: None  Additional Factors Impacting ED Encounter Risk Prescriptions  Barth Kirks. Sedonia Small, [place] [email or web address]  Final Clinical Impressions(s) / ED Diagnoses     ICD-10-CM   1. Neck pain  M54.2       ED Discharge Orders          Ordered    lidocaine (LIDODERM) 5 %  Every 24 hours        03/04/22 2314     methocarbamol (ROBAXIN) 500 MG tablet  Every 8 hours PRN        03/04/22 2314    naproxen (NAPROSYN) 500 MG tablet  2 times daily        03/04/22 2316             Discharge Instructions Discussed  with and Provided to Patient:    Discharge Instructions      You were evaluated in the Emergency Department and after careful evaluation, we did not find any emergent condition requiring admission or further testing in the hospital.  Your exam/testing today was overall reassuring.  Take the meds provided for pain as we discussed and follow up with PCP.  Please return to the Emergency Department if you experience any worsening of your condition.  Thank you for allowing [place] to be a part of your care.       Maudie Flakes, MD 03/04/22 [phone]

## 2022-03-25 DIAGNOSIS — F332 Major depressive disorder, recurrent severe without psychotic features: Secondary | ICD-10-CM | POA: Diagnosis not present

## 2022-04-01 DIAGNOSIS — E039 Hypothyroidism, unspecified: Secondary | ICD-10-CM | POA: Diagnosis not present

## 2022-04-02 DIAGNOSIS — L401 Generalized pustular psoriasis: Secondary | ICD-10-CM | POA: Diagnosis not present

## 2022-05-02 ENCOUNTER — Ambulatory Visit (INDEPENDENT_AMBULATORY_CARE_PROVIDER_SITE_OTHER): Payer: Medicare PPO | Admitting: Pulmonary Disease

## 2022-05-02 ENCOUNTER — Encounter: Payer: Self-pay | Admitting: Pulmonary Disease

## 2022-05-02 VITALS — BP 124/74 | HR 92 | Temp 97.7°F | Ht 71.0 in | Wt 243.4 lb

## 2022-05-02 DIAGNOSIS — G4733 Obstructive sleep apnea (adult) (pediatric): Secondary | ICD-10-CM

## 2022-05-02 DIAGNOSIS — J432 Centrilobular emphysema: Secondary | ICD-10-CM | POA: Diagnosis not present

## 2022-05-02 NOTE — Progress Notes (Signed)
[name]    [id number]    1940/06/04  Primary Care Physician:Pharr, Thayer Jew, MD  Referring Physician: Deland Pretty, MD [address] [place],  [place] [address]  Chief complaint:   Evaluation for sleep apnea Known to have obstructive sleep apnea  HPI:  Mild obstructive sleep apnea on home sleep study On auto CPAP  Benefiting from CPAP therapy  We do not have a download on him today  Still does have some daytime sleepiness and fatigue  He does feel good when he wakes up in the morning feels like he is at a good nights rest  Occasional chest congestion  Does have a history of severe obstructive lung disease, uses a nebulizer treatment whenever he feels he needs to Has not been on any maintenance inhalers  He is retired-[employer] smoker quit in [place] not had any significant problems with his breathing  History of depression with associated hospitalization about November December October November 2022  Admits to some dryness of his mouth in the mornings, no headache He does have his mouth open during sleep  Overall feeling well  Outpatient Encounter Medications as of 05/02/2022  Medication Sig   acetaminophen (TYLENOL) 325 MG tablet Take 650 mg by mouth every 6 (six) hours as needed for mild pain or headache.   albuterol (PROVENTIL HFA;VENTOLIN HFA) 108 (90 BASE) MCG/ACT inhaler Inhale 1-2 puffs into the lungs every 6 (six) hours as needed for wheezing or shortness of breath.   ARIPiprazole (ABILIFY) 5 MG tablet Take 5 mg by mouth daily.   aspirin EC 81 MG tablet Take 81 mg by mouth daily.   atorvastatin (LIPITOR) 40 MG tablet TAKE 1 TABLET BY MOUTH EVERY DAY IN THE EVENING   Cholecalciferol (VITAMIN D3) 1000 UNITS CAPS Take 1,000 Units by mouth daily.   Cyanocobalamin (VITAMIN B-12) 5000 MCG TBDP Take 5,000 mcg by mouth daily.   cycloSPORINE (RESTASIS) 0.05 % ophthalmic emulsion 1 drop into affected eye   docusate  sodium (COLACE) 100 MG capsule Take 100 mg by mouth every other day.   ezetimibe (ZETIA) 10 MG tablet Take 1 tablet (10 mg total) by mouth every evening.   fexofenadine (ALLEGRA) 180 MG tablet Take 180 mg by mouth daily.   fluticasone (CUTIVATE) 0.05 % cream Apply 1 application topically 2 (two) times daily.   fluticasone (FLONASE) 50 MCG/ACT nasal spray Place into both nostrils daily.   levothyroxine (SYNTHROID) 88 MCG tablet Take 88 mcg by mouth daily.   melatonin 5 MG TABS 1 tablet at bedtime.   methocarbamol (ROBAXIN) 500 MG tablet Take 1 tablet (500 mg total) by mouth every 8 (eight) hours as needed for muscle spasms.   mirtazapine (REMERON) 15 MG tablet Take 30 mg by mouth at bedtime.   Multiple Vitamin (MULTIVITAMIN) capsule Take 1 capsule by mouth daily.   naproxen (NAPROSYN) 500 MG tablet Take 1 tablet (500 mg total) by mouth 2 (two) times daily.   nitroGLYCERIN (NITROLINGUAL) 0.4 MG/SPRAY spray Place 1 spray under the tongue every 5 (five) minutes x 3 doses as needed for chest pain. Use as directed as needed   NON FORMULARY CPAP machine every night   Omega-3 1000 MG CAPS    pantoprazole (PROTONIX) 40 MG tablet Take 30-60 min before first meal of the day (Patient taking differently: Take 40 mg by mouth daily before breakfast.)   pyridOXINE (B-6) 50 MG tablet Take 50 mg by mouth daily.  KLOR-CON M10 10 MEQ tablet TAKE 1 TABLET (10 MEQ TOTAL) BY MOUTH DAILY. PLEASE CALL TO SCHEDULE APPOINTMENT FOR FUTURE REFILLS. (Patient not taking: Reported on 05/02/2022)   lidocaine (LIDODERM) 5 % Place 1 patch onto the skin daily. Remove & Discard patch within 12 hours or as directed by MD (Patient not taking: Reported on 05/02/2022)   No facility-administered encounter medications on file as of 05/02/2022.    Allergies as of 05/02/2022 - Review Complete 05/02/2022  Allergen Reaction Noted   Slo-niacin [niacin er] Other (See Comments) 05/02/2022    Past Medical History:  Diagnosis Date   Allergic  asthma    Barrett's esophagus    BPH (benign prostatic hyperplasia)    CAD (coronary artery disease)    CAD (coronary atherosclerotic disease)    Confusion    COPD (chronic obstructive pulmonary disease) (HCC)    Diabetes (HCC)    GERD (gastroesophageal reflux disease)    Heart attack ([place])    x2   Hiatal hernia    Hyperlipidemia    Hypertension    Hypothyroidism    Irregular heart rhythm    Polypharmacy    Psoriasis    Sleep apnea    wears CPAP nightly    Past Surgical History:  Procedure Laterality Date   CATARACT EXTRACTION Left 09/2021   COLONOSCOPY  03/2017   CORONARY ANGIOPLASTY WITH STENT PLACEMENT  08/15/2014   PCI of pLAD Xience alpine DES   EYE SURGERY  01/2017   eyelids lifted   LEFT HEART CATHETERIZATION WITH CORONARY ANGIOGRAM N/A 08/15/2014   Procedure: LEFT HEART CATHETERIZATION WITH CORONARY ANGIOGRAM;  Surgeon: Peter M Martinique, MD;  Location: [facility] CATH LAB;  Service: Cardiovascular;  Laterality: N/A;   left knee arthroscopy Left 1994   NASAL POLYP EXCISION  1953   NASAL SINUS SURGERY     deviated septum   TONSILLECTOMY     TOTAL HIP ARTHROPLASTY Right 06/22/2017   Procedure: RIGHT TOTAL HIP ARTHROPLASTY ANTERIOR APPROACH;  Surgeon: Rod Can, MD;  Location: [place];  Service: Orthopedics;  Laterality: Right;  Needs RNFA   TOTAL HIP REVISION Right 07/10/2017   Procedure: RIGHT TOTAL HIP REVISION FEMORAL COMPONENT;  Surgeon: Rod Can, MD;  Location: [place];  Service: Orthopedics;  Laterality: Right;  Needs RNFA    Family History  Problem Relation Age of Onset   Dementia Mother    COPD Mother    Tuberculosis Mother    Lung cancer Father    Suicidality Father    Other Father        Suicide    Social History   Socioeconomic History   Marital status: Married    Spouse name: Lelon Frohlich   Number of children: 2   Years of education: post grad   Highest education level: Not on file  Occupational History   Occupation: Company secretary    Comment: retired  01/2021  Tobacco Use   Smoking status: Former    Packs/day: 3.00    Years: 33.00    Total pack years: 99.00    Types: Pipe, Cigars, Cigarettes    Quit date: 04/28/1985    Years since quitting: 37.0   Smokeless tobacco: Never  Vaping Use   Vaping Use: Never used  Substance and Sexual Activity   Alcohol use: Yes    Alcohol/week: 2.0 standard drinks of alcohol    Types: 1 Glasses of wine, 1 Shots of liquor per week    Comment: rare   Drug use: No  Sexual activity: Not on file  Other Topics Concern   Not on file  Social History Narrative   04/11/21 Lives with wife   Coffee, tea   Social Determinants of Health   Financial Resource Strain: Not on file  Food Insecurity: Not on file  Transportation Needs: Not on file  Physical Activity: Not on file  Stress: Not on file  Social Connections: Not on file  Intimate Partner Violence: Not on file    Review of Systems  Respiratory:  Positive for apnea and shortness of breath.   Psychiatric/Behavioral:  Positive for sleep disturbance.     Vitals:   05/02/22 1523  BP: 124/74  Pulse: 92  Temp: 97.7 F (36.5 C)  SpO2: 99%      Physical Exam Constitutional:      Appearance: He is obese.  HENT:     Head: Normocephalic.     Mouth/Throat:     Mouth: Mucous membranes are moist.  Eyes:     Pupils: Pupils are equal, round, and reactive to light.  Cardiovascular:     Rate and Rhythm: Normal rate.     Heart sounds: No murmur heard.    No friction rub.  Pulmonary:     Effort: No respiratory distress.     Breath sounds: No stridor. No wheezing or rhonchi.  Musculoskeletal:     Cervical back: No rigidity or tenderness.  Neurological:     Mental Status: He is alert.  Psychiatric:        Mood and Affect: Mood normal.       08/07/2021   10:00 AM  Results of the Epworth flowsheet  Sitting and reading 2  Watching TV 3  Sitting, inactive in a public place (e.g. a theatre or a meeting) 0  As a passenger in a car for an hour  without a break 0  Lying down to rest in the afternoon when circumstances permit 3  Sitting and talking to someone 0  Sitting quietly after a lunch without alcohol 0  In a car, while stopped for a few minutes in traffic 0  Total score 8    Data Reviewed: Previous sleep study not available No download obtainable from his machine since he does not have a card and it  Recent home sleep study did reveal mild obstructive sleep apnea with an AHI of 10.6, mild oxygen desaturations  No download available  Assessment:  History of obstructive sleep apnea -Mild obstructive sleep apnea  History of obstructive lung disease -Nebulization treatments as needed -Does have significant bronchodilator response on last PFT in June 2023  Severe obstructive airway disease on recent PFT  History of coronary artery disease -Appears stable  History of depression -Controlled  Plan/Recommendations: Continue auto CPAP of 5-15  Graded activities as tolerated  Follow-up in 6 months  Continue bronchodilators as needed  Sherrilyn Rist MD [facility] 05/02/2022, 3:27 PM  CC: Deland Pretty, MD

## 2022-05-02 NOTE — Patient Instructions (Signed)
I will see you back in 6 months  Continue using CPAP on a nightly basis  Continue albuterol as needed  Call with significant concerns

## 2022-05-06 DIAGNOSIS — G4733 Obstructive sleep apnea (adult) (pediatric): Secondary | ICD-10-CM | POA: Diagnosis not present

## 2022-06-06 DIAGNOSIS — G4733 Obstructive sleep apnea (adult) (pediatric): Secondary | ICD-10-CM | POA: Diagnosis not present

## 2022-06-30 ENCOUNTER — Ambulatory Visit: Payer: Medicare PPO | Admitting: Cardiology

## 2022-07-03 ENCOUNTER — Encounter: Payer: Self-pay | Admitting: Cardiology

## 2022-07-03 ENCOUNTER — Ambulatory Visit: Payer: Medicare PPO | Admitting: Cardiology

## 2022-07-03 VITALS — BP 128/78 | HR 98 | Resp 16 | Ht 71.0 in | Wt 235.0 lb

## 2022-07-03 DIAGNOSIS — I453 Trifascicular block: Secondary | ICD-10-CM

## 2022-07-03 DIAGNOSIS — I2510 Atherosclerotic heart disease of native coronary artery without angina pectoris: Secondary | ICD-10-CM

## 2022-07-03 DIAGNOSIS — I10 Essential (primary) hypertension: Secondary | ICD-10-CM | POA: Diagnosis not present

## 2022-07-03 DIAGNOSIS — E7800 Pure hypercholesterolemia, unspecified: Secondary | ICD-10-CM | POA: Insufficient documentation

## 2022-07-03 NOTE — Patient Instructions (Signed)
Your heart appears to be very stable from last year's examination and your EKG is also unchanged.  Your fatigue, lack of enthusiasm to do anything and feeling down may be a relapse of your depression that you had an mental meltdown that you had last year, I would highly encourage you to get back to a psychologist and get an appointment.  I would like you to start walking even just 5 minutes a day as the weather is getting better.  Please let me know if you have any chest pain or dyspnea.  You may need to revisit sleep study as you feel you are sleeping all the time and still feel tired.  I will see you back here again in 6 months instead of 1 year.

## 2022-07-03 NOTE — Progress Notes (Signed)
[facility]  Primary Physician/Referring:  Deland Pretty, MD  Patient ID: [name], male    DOB: 1941/04/18, 82 y.o.   MRN: [id number]  Chief Complaint  Patient presents with   Coronary Artery Disease   Hypertension   Hyperlipidemia   Follow-up    1 year    HPI:    HPI: [name]  is a 82 y.o. Caucasian male with past medical history of venous insufficiency with prior venous stasis ulcer, OSA on CPAP, hypertension, GERD, morbid obesity, trifascicular block, hypercholesterolemia and CAD S/P MI and stent to LAD in 1987 and again in 1997, repeat stenting to LAD stenosis with DES in April 2016 presents for annual visit. He has not had recurrence of angina.  Patient called and made an appointment with [place] to come in sooner as he has been feeling very down, has not been exercising, feels withdrawn and states that he has been sleeping a lot and was worried about coronary artery disease.   He denies any chest pain or dyspnea.     Past Medical History:  Diagnosis Date   Allergic asthma    Barrett's esophagus    BPH (benign prostatic hyperplasia)    CAD (coronary artery disease)    CAD (coronary atherosclerotic disease)    Confusion    COPD (chronic obstructive pulmonary disease) (HCC)    Diabetes (HCC)    GERD (gastroesophageal reflux disease)    Heart attack ([place])    x2   Hiatal hernia    Hyperlipidemia    Hypertension    Hypothyroidism    Irregular heart rhythm    Polypharmacy    Primary hypertension 08/14/2014   Psoriasis    Sleep apnea    wears CPAP nightly   Past Surgical History:  Procedure Laterality Date   CATARACT EXTRACTION Left 09/2021   COLONOSCOPY  03/2017   CORONARY ANGIOPLASTY WITH STENT PLACEMENT  08/15/2014   PCI of pLAD Xience alpine DES   EYE SURGERY  01/2017   eyelids lifted   LEFT HEART CATHETERIZATION WITH CORONARY ANGIOGRAM N/A 08/15/2014   Procedure: LEFT HEART CATHETERIZATION WITH CORONARY ANGIOGRAM;  Surgeon: Peter M Martinique, MD;   Location: [facility] CATH LAB;  Service: Cardiovascular;  Laterality: N/A;   left knee arthroscopy Left 1994   NASAL POLYP EXCISION  1953   NASAL SINUS SURGERY     deviated septum   TONSILLECTOMY     TOTAL HIP ARTHROPLASTY Right 06/22/2017   Procedure: RIGHT TOTAL HIP ARTHROPLASTY ANTERIOR APPROACH;  Surgeon: Rod Can, MD;  Location: [place];  Service: Orthopedics;  Laterality: Right;  Needs RNFA   TOTAL HIP REVISION Right 07/10/2017   Procedure: RIGHT TOTAL HIP REVISION FEMORAL COMPONENT;  Surgeon: Rod Can, MD;  Location: [place];  Service: Orthopedics;  Laterality: Right;  Needs RNFA    Social History   Tobacco Use   Smoking status: Former    Packs/day: 3.00    Years: 33.00    Total pack years: 99.00    Types: Pipe, Cigars, Cigarettes    Quit date: 04/28/1985    Years since quitting: 37.2   Smokeless tobacco: Never  Substance Use Topics   Alcohol use: Yes    Alcohol/week: 2.0 standard drinks of alcohol    Types: 1 Glasses of wine, 1 Shots of liquor per week    Comment: rare   Marital Status: Married  ROS  Review of Systems  Cardiovascular:  Negative for chest pain, dyspnea on exertion and leg swelling.  Objective      07/03/2022    9:38 AM 05/02/2022    3:23 PM 03/04/2022   11:25 PM  Vitals with BMI  Height '5\' 11"'$  '5\' 11"'$    Weight 235 lbs 243 lbs 6 oz   BMI [id number]   Systolic 0000000 [id number]  Diastolic 78 74 89  Pulse 98 92 59   Blood pressure 128/78, pulse 98, resp. rate 16, height '5\' 11"'$  (1.803 m), weight 235 lb (106.6 kg), SpO2 97 %.     Physical Exam Constitutional:      Appearance: He is well-developed.     Comments: Well built   Cardiovascular:     Rate and Rhythm: Normal rate and regular rhythm. Frequent Extrasystoles are present.    Pulses: Normal pulses and intact distal pulses.     Heart sounds: S1 normal and S2 normal. Murmur heard.     Blowing decrescendo early diastolic murmur is present with a grade of 2/4 at the upper right sternal border  radiating to the apex.  Pulmonary:     Effort: Pulmonary effort is normal. No accessory muscle usage or respiratory distress.     Breath sounds: Normal breath sounds.  Abdominal:     General: Abdomen is flat. Bowel sounds are normal.     Palpations: Abdomen is soft.  Musculoskeletal:     Cervical back: Normal range of motion.     Right lower leg: Edema (Trace, pitting) present.     Left lower leg: Edema (Trace, pitting) present.     Laboratory examination:    External labs:  Labs 12/18/2021:  Sodium 143, potassium 4.2, BUN 13, creatinine 1.01, EGFR 69, LFTs normal.  Hb 16.5/HCT 47.9, platelets 152, normal indicis.  TSH 14.20. A1C 5.200 % 04/18/2021  Cholesterol, total 109.000 m 05/17/2020 HDL 34.000 mg 05/17/2020 LDL 60.000 MG 05/17/2020 Triglycerides 63.000 mg 04/18/2021   Labs 04/18/2021:  Total cholesterol 98, triglycerides 63, HDL 46, LDL 39.  TSH 0.23, mildly reduced (0.27-4.2).  A1c 5.2%.  Hb 14.6/HCT 44.2, platelets 142.  Normal indicis.  Potassium 4.3, BUN 9, creatinine 0.76, EGFR 85 mill, LFT normal Medications    Current Outpatient Medications:    acetaminophen (TYLENOL) 325 MG tablet, Take 650 mg by mouth every 6 (six) hours as needed for mild pain or headache., Disp: , Rfl:    albuterol (PROVENTIL HFA;VENTOLIN HFA) 108 (90 BASE) MCG/ACT inhaler, Inhale 1-2 puffs into the lungs every 6 (six) hours as needed for wheezing or shortness of breath., Disp: 1 Inhaler, Rfl: 11   ARIPiprazole (ABILIFY) 5 MG tablet, Take 5 mg by mouth daily., Disp: , Rfl:    aspirin EC 81 MG tablet, Take 81 mg by mouth daily., Disp: , Rfl:    atorvastatin (LIPITOR) 40 MG tablet, TAKE 1 TABLET BY MOUTH EVERY DAY IN THE EVENING, Disp: 90 tablet, Rfl: 3   Cholecalciferol (VITAMIN D3) 1000 UNITS CAPS, Take 1,000 Units by mouth daily., Disp: , Rfl:    Cyanocobalamin (VITAMIN B-12) 5000 MCG TBDP, Take 5,000 mcg by mouth daily., Disp: , Rfl:    cycloSPORINE (RESTASIS) 0.05 % ophthalmic  emulsion, 1 drop into affected eye, Disp: , Rfl:    docusate sodium (COLACE) 100 MG capsule, Take 100 mg by mouth every other day., Disp: , Rfl:    ezetimibe (ZETIA) 10 MG tablet, Take 1 tablet (10 mg total) by mouth every evening., Disp: 90 tablet, Rfl: 3   fexofenadine (ALLEGRA) 180 MG tablet, Take 180 mg by mouth daily., Disp: ,  Rfl:    fluticasone (CUTIVATE) 0.05 % cream, Apply 1 application topically 2 (two) times daily., Disp: , Rfl: 2   fluticasone (FLONASE) 50 MCG/ACT nasal spray, Place into both nostrils daily., Disp: , Rfl:    KLOR-CON M10 10 MEQ tablet, TAKE 1 TABLET (10 MEQ TOTAL) BY MOUTH DAILY. PLEASE CALL TO SCHEDULE APPOINTMENT FOR FUTURE REFILLS., Disp: 90 tablet, Rfl: 3   levothyroxine (SYNTHROID) 88 MCG tablet, Take 88 mcg by mouth daily., Disp: , Rfl:    lidocaine (LIDODERM) 5 %, Place 1 patch onto the skin daily. Remove & Discard patch within 12 hours or as directed by MD, Disp: 5 patch, Rfl: 0   melatonin 5 MG TABS, 1 tablet at bedtime., Disp: , Rfl:    mirtazapine (REMERON) 15 MG tablet, Take 30 mg by mouth at bedtime., Disp: , Rfl:    Multiple Vitamin (MULTIVITAMIN) capsule, Take 1 capsule by mouth daily., Disp: , Rfl:    naproxen (NAPROSYN) 500 MG tablet, Take 1 tablet (500 mg total) by mouth 2 (two) times daily., Disp: 30 tablet, Rfl: 0   nitroGLYCERIN (NITROLINGUAL) 0.4 MG/SPRAY spray, Place 1 spray under the tongue every 5 (five) minutes x 3 doses as needed for chest pain. Use as directed as needed, Disp: 12 g, Rfl: 12   NON FORMULARY, CPAP machine every night, Disp: , Rfl:    Omega-3 1000 MG CAPS, , Disp: , Rfl:    pantoprazole (PROTONIX) 40 MG tablet, Take 30-60 min before first meal of the day (Patient taking differently: Take 40 mg by mouth daily before breakfast.), Disp: , Rfl:    pyridOXINE (B-6) 50 MG tablet, Take 50 mg by mouth daily., Disp: , Rfl:      Cardiac Studies:   Left Heart Catheterization 08/15/2014: Hx of PCI LAD 1987 and 1997. PCI of proximal LAD  - Xience Alpine DES 3.0 mm x 28 mm -- post-dilated in tapered fashion to 4.1 mm prox & 3.8 mm distal. Left Circumflex: Large caliber, co-dominant vessel  RCA: Large caliber, co-dominant vessel with proximal ~20% stenosis.    Echo 09/30/2016: Normal LV systolic function, EF [id number] with mild diffuse hypokinesis.  Grade 1 diastolic dysfunction.  Moderate left atrial enlargement, mitral annular calcification.  PCV MYOCARDIAL PERFUSION WITH LEXISCAN 04/16/2020 Lexiscan nuclear stress test performed using 1-day protocol. Stress EKG is non-diagnostic, as this is pharmacological stress test. In addition, stress EKG showed sinus rhythm, RBBB, LAFB, frequent PVC's and brief run of atrial tachycardia, no ischemic changes. Normal myocardial perfusion. Stress LVEF 29%. High risk study due to low stress LVEF.  PCV ECHOCARDIOGRAM COMPLETE 07/03/2020  Narrative Echocardiogram 07/03/2020: Normal LV systolic function with visual EF 50-55%. Left ventricle cavity is normal in size. Normal global wall motion. Normal diastolic filling pattern, normal LAP. Mild (Grade I) aortic regurgitation. Trace tricuspid regurgitation. No evidence of pulmonary hypertension. Compared to prior study dated 09/30/2016: No significant change.     EKG  EKG 07/03/2022: Sinus rhythm with first-degree AV block at rate of 94 bpm, left axis deviation, left anterior fascicular block.  Right bundle branch block.  Trifascicular block.  Compared to 06/28/2021, no change.  Assessment     ICD-10-CM   1. Atherosclerosis of native coronary artery of native heart without angina pectoris  I25.10 EKG 12-Lead    Lipid Panel With LDL/HDL Ratio    LDL cholesterol, direct    2. Trifascicular block  I45.3     3. Primary hypertension  I10     4. Pure hypercholesterolemia  E78.00 Lipid Panel With LDL/HDL Ratio    LDL cholesterol, direct     No orders of the defined types were placed in this encounter.  Medications Discontinued During This  Encounter  Medication Reason   methocarbamol (ROBAXIN) 500 MG tablet    Orders Placed This Encounter  Procedures   Lipid Panel With LDL/HDL Ratio   LDL cholesterol, direct   EKG 12-Lead     Recommendations:   [name] is a 82 y.o. Caucasian male with past medical history of venous insufficiency with prior venous stasis ulcer, OSA on CPAP, hypertension, GERD, morbid obesity, trifascicular block, hypercholesterolemia and CAD S/P MI and stent to LAD in 1987 and again in 1997, repeat stenting to LAD stenosis with DES in April 2016 presents for annual visit. He has not had recurrence of angina.  1. Atherosclerosis of native coronary artery of native heart without angina pectoris Patient called and made an appointment with [place] to come in sooner as he has been feeling very down, has not been exercising, feels withdrawn and states that he has been sleeping a lot and was worried about coronary artery disease.  His physical examination is unchanged, EKG is unchanged as well.  I suspect he is probably having recurrence of his mental meltdown that he had last year and he was extremely withdrawn and needed psychiatry help and also psychotherapy.  Advised him that he needs to immediately contact them.  I also encouraged him to walk on a daily basis as the weather is getting better and to call me and let me know if he is having any shortness of breath or chest discomfort.  I do not suspect there is progression of coronary disease.  2. Trifascicular block Although he has underlying trifascicular block, heart rate is almost in the tachycardia range and hence I do not suspect this to be an etiology for his overall feeling sleepy, tired and withdrawn.  He does have obstructive sleep apnea, he may need to revisit sleep evaluation.  He will contact his physician for this.  3. Primary hypertension Blood pressure is well-controlled.  I did not make any changes to his medications.  4. Pure  hypercholesterolemia I reviewed his external labs, I do not appear to find lipid status, I will obtain nonfasting lipids today along with direct LDL.  I usually see him once a year but in view of his above symptoms, I will see him back in 6 months for follow-up.   Adrian Prows, MD, [facility] 07/03/2022, 10:14 AM Office: [phone] Fax: [phone] Pager: [phone]

## 2022-07-04 LAB — LDL CHOLESTEROL, DIRECT: LDL Direct: 76 mg/dL (ref 0–99)

## 2022-07-04 LAB — LIPID PANEL WITH LDL/HDL RATIO
Cholesterol, Total: 130 mg/dL (ref 100–199)
HDL: 35 mg/dL — ABNORMAL LOW (ref >39)
LDL Chol Calc (NIH): 69 mg/dL (ref 0–99)
LDL/HDL Ratio: 2 ratio (ref 0.0–3.6)
Triglycerides: 147 mg/dL (ref 0–149)
VLDL Cholesterol Cal: 26 mg/dL (ref 5–40)

## 2022-07-04 NOTE — Progress Notes (Signed)
Normal lipids

## 2022-07-05 DIAGNOSIS — G4733 Obstructive sleep apnea (adult) (pediatric): Secondary | ICD-10-CM | POA: Diagnosis not present

## 2022-07-08 DIAGNOSIS — G8929 Other chronic pain: Secondary | ICD-10-CM | POA: Diagnosis not present

## 2022-07-08 DIAGNOSIS — Z96641 Presence of right artificial hip joint: Secondary | ICD-10-CM | POA: Diagnosis not present

## 2022-07-08 DIAGNOSIS — M48061 Spinal stenosis, lumbar region without neurogenic claudication: Secondary | ICD-10-CM | POA: Diagnosis not present

## 2022-07-08 DIAGNOSIS — F332 Major depressive disorder, recurrent severe without psychotic features: Secondary | ICD-10-CM | POA: Diagnosis not present

## 2022-07-08 DIAGNOSIS — M545 Low back pain, unspecified: Secondary | ICD-10-CM | POA: Diagnosis not present

## 2022-07-18 ENCOUNTER — Encounter: Payer: Self-pay | Admitting: Physical Medicine and Rehabilitation

## 2022-07-21 DIAGNOSIS — I10 Essential (primary) hypertension: Secondary | ICD-10-CM | POA: Diagnosis not present

## 2022-07-24 DIAGNOSIS — J387 Other diseases of larynx: Secondary | ICD-10-CM | POA: Diagnosis not present

## 2022-07-24 DIAGNOSIS — G4733 Obstructive sleep apnea (adult) (pediatric): Secondary | ICD-10-CM | POA: Diagnosis not present

## 2022-07-24 DIAGNOSIS — Z23 Encounter for immunization: Secondary | ICD-10-CM | POA: Diagnosis not present

## 2022-07-24 DIAGNOSIS — I2510 Atherosclerotic heart disease of native coronary artery without angina pectoris: Secondary | ICD-10-CM | POA: Diagnosis not present

## 2022-07-24 DIAGNOSIS — Z0000 Encounter for general adult medical examination without abnormal findings: Secondary | ICD-10-CM | POA: Diagnosis not present

## 2022-08-05 DIAGNOSIS — G4733 Obstructive sleep apnea (adult) (pediatric): Secondary | ICD-10-CM | POA: Diagnosis not present

## 2022-08-28 ENCOUNTER — Encounter: Payer: Medicare PPO | Admitting: Physical Medicine and Rehabilitation

## 2022-09-01 ENCOUNTER — Encounter: Payer: Medicare PPO | Attending: Physical Medicine and Rehabilitation | Admitting: Physical Medicine and Rehabilitation

## 2022-09-01 ENCOUNTER — Encounter: Payer: Self-pay | Admitting: Physical Medicine and Rehabilitation

## 2022-09-01 VITALS — BP 105/73 | HR 96 | Ht 71.0 in | Wt 235.0 lb

## 2022-09-01 DIAGNOSIS — E1342 Other specified diabetes mellitus with diabetic polyneuropathy: Secondary | ICD-10-CM | POA: Diagnosis not present

## 2022-09-01 DIAGNOSIS — Z79899 Other long term (current) drug therapy: Secondary | ICD-10-CM | POA: Diagnosis not present

## 2022-09-01 DIAGNOSIS — M5126 Other intervertebral disc displacement, lumbar region: Secondary | ICD-10-CM

## 2022-09-01 DIAGNOSIS — E786 Lipoprotein deficiency: Secondary | ICD-10-CM | POA: Insufficient documentation

## 2022-09-01 DIAGNOSIS — R400 Somnolence: Secondary | ICD-10-CM | POA: Insufficient documentation

## 2022-09-01 NOTE — Patient Instructions (Addendum)
Methylated multivitamin: methylfolate- you don't want folic acid  Metanx B6, B12, methylfolate  Myrbetriq for wife

## 2022-09-01 NOTE — Progress Notes (Signed)
Subjective:    Patient ID: [name], male    DOB: [birth year], 82 y.o.   MRN: [id number]  HPI [name] is an 82 year old man who presents to establish care for herniated disc pain.  1) Herniated lumbar spine discs -steroid injections no longer work- he used to get them from Dr. Ethelene Hal -he feels there has got to be sometime else that can be done.  -he respects that Dr. Renne Crigler is is not a pill pusher, respects that he is a physician first -last steroid injection seemed to last hardly at all -recently the pain has been severe.  2) Megaloblastic anemia: -he is not sure if B12 and folate were checked Pain Inventory Average Pain 4 Pain Right Now 7 My pain is aching  In the last 24 hours, has pain interfered with the following? General activity 4 Relation with others 4 Enjoyment of life 6 What TIME of day is your pain at its worst? morning  Sleep (in general) Good  Pain is worse with: bending and some activites Pain improves with: rest and medication Relief from Meds: 7  walk without assistance how many minutes can you walk? 20 minutes ability to climb steps?  yes do you drive?  yes Do you have any goals in this area?  yes  not employed: date last employed 10-22 I need assistance with the following:  household duties and shopping Do you have any goals in this area?  yes  bladder control problems  Any changes since last visit?  no  Any changes since last visit?  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  Tobacco Use   Smoking status: Former    Packs/day:  3.00    Years: 33.00    Additional pack years: 0.00    Total pack years: 99.00    Types: Pipe, Cigars, Cigarettes    Quit date: 04/28/1985    Years since quitting: 37.3   Smokeless tobacco: Never  Vaping Use   Vaping Use: Never used  Substance and Sexual Activity   Alcohol use: Yes    Alcohol/week: 2.0 standard drinks of alcohol    Types: 1 Glasses of wine, 1 Shots of liquor per week    Comment: rare   Drug use: No   Sexual activity: Not on file  Other Topics Concern   Not on file  Social History Narrative   04/11/21 Lives with wife   Coffee, tea   Social Determinants of Health   Financial Resource Strain: Not on file  Food Insecurity: Not on file  Transportation Needs: Not on file  Physical Activity: Not on file  Stress: Not on file  Social Connections: Not on file   Past Surgical History:  Procedure Laterality Date   CATARACT EXTRACTION Left 09/2021   COLONOSCOPY  03/2017   CORONARY ANGIOPLASTY WITH STENT PLACEMENT  08/15/2014   PCI of pLAD Xience alpine DES   EYE SURGERY  01/2017   eyelids lifted   LEFT HEART CATHETERIZATION WITH CORONARY ANGIOGRAM N/A 84/19/2016   Procedure: LEFT HEART CATHETERIZATION WITH CORONARY ANGIOGRAM;  Surgeon: Peter M Swaziland, MD;  Location: [facility] CATH LAB;  Service: Cardiovascular;  Laterality: N/A;   left knee arthroscopy Left 1994   NASAL POLYP EXCISION  1953   NASAL SINUS SURGERY     deviated septum   TONSILLECTOMY     TOTAL HIP ARTHROPLASTY Right 06/22/2017   Procedure: RIGHT TOTAL HIP ARTHROPLASTY ANTERIOR APPROACH;  Surgeon: Samson Frederic, MD;  Location: [facility] OR;  Service: Orthopedics;  Laterality: Right;  Needs RNFA   TOTAL HIP REVISION Right 07/10/2017   Procedure: RIGHT TOTAL HIP REVISION FEMORAL COMPONENT;  Surgeon: Samson Frederic, MD;  Location: [facility] OR;  Service: Orthopedics;  Laterality: Right;  Needs RNFA   Past Medical History:  Diagnosis Date   Allergic asthma    Barrett's esophagus    BPH (benign prostatic hyperplasia)     CAD (coronary artery disease)    CAD (coronary atherosclerotic disease)    Confusion    COPD (chronic obstructive pulmonary disease) (HCC)    Diabetes (HCC)    GERD (gastroesophageal reflux disease)    Heart attack (HCC)    x2   Hiatal hernia    Hyperlipidemia    Hypertension    Hypothyroidism    Irregular heart rhythm    Polypharmacy    Primary hypertension 08/14/2014   Psoriasis    Sleep apnea    wears CPAP nightly   Ht 5\' 11"  (1.803 m)   Wt 235 lb (106.6 kg)   BMI 32.78 kg/m   Opioid Risk Score:   Fall Risk Score:  `1  Depression screen [facility] 2/9     09/01/2022    9:15 AM  Depression screen [facility] 2/9  Decreased Interest 1  Down, Depressed, Hopeless 1  PHQ - 2 Score 2  Altered sleeping 0  Tired, decreased energy 2  Change in appetite 2  Feeling bad or failure about yourself  0  Trouble concentrating 0  Moving slowly or fidgety/restless 0  Suicidal thoughts 0  PHQ-9 Score 6  Difficult doing work/chores Not difficult at all      Review of Systems  Musculoskeletal:  Positive for back pain.  All other systems reviewed and are negative.     Objective:   Physical Exam Gen: no distress, normal appearing HEENT: oral mucosa pink and moist, NCAT Cardio: Reg rate Chest: normal effort, normal rate of breathing Abd: soft, non-distended Ext: no edema Psych: pleasant, normal affect Skin: intact Neuro: Alert and oriented x3     Assessment & Plan:   1) Chronic Pain Syndrome secondary to herniated lumbar spine discs -discussed his current pain regimen -continue walking, recommended increasing 4 times per week 30 minutes -encouraged walking with his friend  -discussed aquatherapy  -encouraged heating pad since that sends the blood flow to the area.   -Discussed Qutenza as an option for neuropathic pain control. Discussed that this is a capsaicin patch, stronger than capsaicin cream. Discussed that it is currently approved for diabetic peripheral neuropathy and  post-herpetic neuralgia, but that it has also shown benefit in treating other forms of neuropathy. Provided patient with link to site to learn more about the patch: https://www.clark.biz/. Discussed that the patch would be placed in office and benefits usually last 3 months. Discussed that unintended exposure to capsaicin can cause severe irritation of eyes, mucous membranes, respiratory tract, and skin, but that Qutenza is a  local treatment and does not have the systemic side effects of other nerve medications. Discussed that there may be pain, itching, erythema, and decreased sensory function associated with the application of Qutenza. Side effects usually subside within 1 week. A cold pack of analgesic medications can help with these side effects. Blood pressure can also be increased due to pain associated with administration of the patch.   Prescribed Zynex Nexwave  -Provided with a pain relief journal and discussed that it contains foods and lifestyle tips to naturally help to improve pain. Discussed that these lifestyle strategies are also very good for health unlike some medications which can have negative side effects. Discussed that the act of keeping a journal can be therapeutic and helpful to realize patterns what helps to trigger and alleviate pain.    -Discussed current symptoms of pain and history of pain.  -Discussed benefits of exercise in reducing pain. -Discussed following foods that may reduce pain: 1) Ginger (especially studied for arthritis)- reduce leukotriene production to decrease inflammation 2) Blueberries- high in phytonutrients that decrease inflammation 3) Salmon- marine omega-3s reduce joint swelling and pain 4) Pumpkin seeds- reduce inflammation 5) dark chocolate- reduces inflammation 6) turmeric- reduces inflammation 7) tart cherries - reduce pain and stiffness 8) extra virgin olive oil - its compound olecanthal helps to block prostaglandins  9) chili peppers- can  be eaten or applied topically via capsaicin 10) mint- helpful for headache, muscle aches, joint pain, and itching 11) garlic- reduces inflammation  Link to further information on diet for chronic pain: http://www.bray.com/    2) Daytime somnolence: -discussed that B12 deficiency could contribute -recommended B12, methylfolate, and B6 combined supplement -check vitamin B12, folate, testosterone levels  3) Low HDL: -discussed that lack of exercise can contribute to this -provided list of foods to help increase HDL  4) Polypharmacy:  -discussed his desire to be on less medication -discussed that Protonix can be cause of the B12 deficiency.

## 2022-09-02 ENCOUNTER — Other Ambulatory Visit: Payer: Self-pay | Admitting: Physical Medicine and Rehabilitation

## 2022-09-02 ENCOUNTER — Telehealth: Payer: Self-pay

## 2022-09-02 ENCOUNTER — Encounter: Payer: Self-pay | Admitting: *Deleted

## 2022-09-02 LAB — VITAMIN D 25 HYDROXY (VIT D DEFICIENCY, FRACTURES): Vit D, 25-Hydroxy: 36.9 ng/mL (ref 30.0–100.0)

## 2022-09-02 LAB — TESTOSTERONE: Testosterone: 860 ng/dL (ref 264–916)

## 2022-09-02 LAB — B12 AND FOLATE PANEL
Folate: 11.9 ng/mL (ref >3.0)
Vitamin B-12: 679 pg/mL (ref 232–1245)

## 2022-09-02 MED ORDER — VITAMIN D (ERGOCALCIFEROL) 1.25 MG (50000 UNIT) PO CAPS: 50000.0000 [IU] | ORAL_CAPSULE | ORAL | 0 refills | Status: DC

## 2022-09-02 NOTE — Telephone Encounter (Signed)
Notified of DR Raulkar's message.

## 2022-09-02 NOTE — Telephone Encounter (Signed)
[name] would like to know the Vitamin D supplement you said he could pick up?Marland KitchenHe said it had a long name.  Call back phone [phone].

## 2022-09-04 ENCOUNTER — Telehealth: Payer: Self-pay

## 2022-09-04 DIAGNOSIS — G4733 Obstructive sleep apnea (adult) (pediatric): Secondary | ICD-10-CM | POA: Diagnosis not present

## 2022-09-04 DIAGNOSIS — E1342 Other specified diabetes mellitus with diabetic polyneuropathy: Secondary | ICD-10-CM

## 2022-09-04 MED ORDER — QUTENZA (4 PATCH) 8 % EX KIT: 4.0000 | PACK | Freq: Once | CUTANEOUS | 0 refills | Status: DC

## 2022-09-04 NOTE — Telephone Encounter (Signed)
Qutenza sent to specialty pharmacy

## 2022-09-05 ENCOUNTER — Encounter (HOSPITAL_BASED_OUTPATIENT_CLINIC_OR_DEPARTMENT_OTHER): Payer: Medicare PPO | Admitting: Physical Medicine and Rehabilitation

## 2022-09-05 DIAGNOSIS — M5126 Other intervertebral disc displacement, lumbar region: Secondary | ICD-10-CM

## 2022-09-05 DIAGNOSIS — E1342 Other specified diabetes mellitus with diabetic polyneuropathy: Secondary | ICD-10-CM | POA: Diagnosis not present

## 2022-09-05 NOTE — Telephone Encounter (Signed)
PA Approved for Qutenza 09/04/2022-04/28/2023

## 2022-09-05 NOTE — Progress Notes (Signed)
Subjective:    Patient ID: [name], male    DOB: 05-10-1940, 82 y.o.   MRN: [id number]  HPI An audio/video tele-health visit is felt to be the most appropriate encounter for this patient at this time. This is a follow up tele-visit via phone. The patient is at home. MD is at office. Prior to scheduling this appointment, our staff discussed the limitations of evaluation and management by telemedicine and the availability of in-person appointments. The patient expressed understanding and agreed to proceed.   [name] is an 82 year old man who presents to establish care for herniated disc pain.  1) Herniated lumbar spine discs -steroid injections no longer work- he used to get them from Dr. Ethelene Hal -he feels there has got to be sometime else that can be done.  -he respects that Dr. Renne Crigler is is not a pill pusher, respects that he is a physician first -last steroid injection seemed to last hardly at all -recently the pain has been severe. -stopped the naproxen -he continues to use tylenol and using heating pad  2) Megaloblastic anemia: -he is not sure if B12 and folate were checked Pain Inventory Average Pain 4 Pain Right Now 7 My pain is aching  In the last 24 hours, has pain interfered with the following? General activity 4 Relation with others 4 Enjoyment of life 6 What TIME of day is your pain at its worst? morning  Sleep (in general) Good  Pain is worse with: bending and some activites Pain improves with: rest and medication Relief from Meds: 7  walk without assistance how many minutes can you walk? 20 minutes ability to climb steps?  yes do you drive?  yes Do you have any goals in this area?  yes  not employed: date last employed 10-22 I need assistance with the following:  household duties and shopping Do you have any goals in this area?  yes  bladder control problems  Any changes since last visit?  no  Any changes since last visit?  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  Tobacco Use   Smoking status: Former    Packs/day: 3.00    Years: 33.00    Additional pack years: 0.00    Total pack years: 99.00    Types: Pipe, Cigars, Cigarettes    Quit date: 04/28/1985    Years since quitting: 37.3   Smokeless tobacco: Never  Vaping Use   Vaping Use: Never used  Substance and Sexual Activity   Alcohol use: Yes    Alcohol/week: 2.0 standard drinks of alcohol    Types: 1 Glasses of wine, 1 Shots of liquor per week    Comment: rare   Drug use: No   Sexual activity: Not on file  Other Topics Concern   Not on file  Social History Narrative   04/11/21 Lives with wife   Coffee, tea   Social Determinants of Health  Financial Resource Strain: Not on file  Food Insecurity: Not on file  Transportation Needs: Not on file  Physical Activity: Not on file  Stress: Not on file  Social Connections: Not on file   Past Surgical History:  Procedure Laterality Date   CATARACT EXTRACTION Left 09/2021   COLONOSCOPY  03/2017   CORONARY ANGIOPLASTY WITH STENT PLACEMENT  08/15/2014   PCI of pLAD Xience alpine DES   EYE SURGERY  01/2017   eyelids lifted   LEFT HEART CATHETERIZATION WITH CORONARY ANGIOGRAM N/A 08/15/2014   Procedure: LEFT HEART CATHETERIZATION WITH CORONARY ANGIOGRAM;  Surgeon: Peter M Swaziland, MD;  Location: [facility] CATH LAB;  Service: Cardiovascular;  Laterality: N/A;   left knee arthroscopy Left 1994   NASAL POLYP EXCISION  1953   NASAL SINUS SURGERY     deviated septum   TONSILLECTOMY     TOTAL HIP ARTHROPLASTY Right  06/22/2017   Procedure: RIGHT TOTAL HIP ARTHROPLASTY ANTERIOR APPROACH;  Surgeon: Samson Frederic, MD;  Location: [facility] OR;  Service: Orthopedics;  Laterality: Right;  Needs RNFA   TOTAL HIP REVISION Right 07/10/2017   Procedure: RIGHT TOTAL HIP REVISION FEMORAL COMPONENT;  Surgeon: Samson Frederic, MD;  Location: [facility] OR;  Service: Orthopedics;  Laterality: Right;  Needs RNFA   Past Medical History:  Diagnosis Date   Allergic asthma    Barrett's esophagus    BPH (benign prostatic hyperplasia)    CAD (coronary artery disease)    CAD (coronary atherosclerotic disease)    Confusion    COPD (chronic obstructive pulmonary disease) (HCC)    Diabetes (HCC)    GERD (gastroesophageal reflux disease)    Heart attack (HCC)    x2   Hiatal hernia    Hyperlipidemia    Hypertension    Hypothyroidism    Irregular heart rhythm    Polypharmacy    Primary hypertension 08/14/2014   Psoriasis    Sleep apnea    wears CPAP nightly   There were no vitals taken for this visit.  Opioid Risk Score:   Fall Risk Score:  `1  Depression screen [facility] 2/9     09/01/2022    9:15 AM  Depression screen [facility] 2/9  Decreased Interest 1  Down, Depressed, Hopeless 1  PHQ - 2 Score 2  Altered sleeping 0  Tired, decreased energy 2  Change in appetite 2  Feeling bad or failure about yourself  0  Trouble concentrating 0  Moving slowly or fidgety/restless 0  Suicidal thoughts 0  PHQ-9 Score 6  Difficult doing work/chores Not difficult at all      Review of Systems  Musculoskeletal:  Positive for back pain.  All other systems reviewed and are negative.      Objective:   Physical Exam Gen: no distress, normal appearing HEENT: oral mucosa pink and moist, NCAT Cardio: Reg rate Chest: normal effort, normal rate of breathing Abd: soft, non-distended Ext: no edema Psych: pleasant, normal affect Skin: intact Neuro: Alert and oriented x3     Assessment & Plan:   1) Chronic Pain Syndrome secondary to  herniated lumbar spine discs -discussed his current pain regimen -continue walking, recommended increasing 4 times per week 30 minutes -encouraged walking with his friend  -discussed aquatherapy  -encouraged heating pad since that sends the blood flow to the area.   -Discussed Qutenza as an option for neuropathic pain control. Discussed that this is a capsaicin patch, stronger than capsaicin cream. Discussed that it is currently approved for diabetic peripheral  neuropathy and post-herpetic neuralgia, but that it has also shown benefit in treating other forms of neuropathy. Provided patient with link to site to learn more about the patch: https://www.clark.biz/. Discussed that the patch would be placed in office and benefits usually last 3 months. Discussed that unintended exposure to capsaicin can cause severe irritation of eyes, mucous membranes, respiratory tract, and skin, but that Qutenza is a local treatment and does not have the systemic side effects of other nerve medications. Discussed that there may be pain, itching, erythema, and decreased sensory function associated with the application of Qutenza. Side effects usually subside within 1 week. A cold pack of analgesic medications can help with these side effects. Blood pressure can also be increased due to pain associated with administration of the patch.    Prescribed Zynex Nexwave  -Provided with a pain relief journal and discussed that it contains foods and lifestyle tips to naturally help to improve pain. Discussed that these lifestyle strategies are also very good for health unlike some medications which can have negative side effects. Discussed that the act of keeping a journal can be therapeutic and helpful to realize patterns what helps to trigger and alleviate pain.    -Discussed current symptoms of pain and history of pain.  -Discussed benefits of exercise in reducing pain. -Discussed following foods that may reduce pain: 1)  Ginger (especially studied for arthritis)- reduce leukotriene production to decrease inflammation 2) Blueberries- high in phytonutrients that decrease inflammation 3) Salmon- marine omega-3s reduce joint swelling and pain 4) Pumpkin seeds- reduce inflammation 5) dark chocolate- reduces inflammation 6) turmeric- reduces inflammation 7) tart cherries - reduce pain and stiffness 8) extra virgin olive oil - its compound olecanthal helps to block prostaglandins  9) chili peppers- can be eaten or applied topically via capsaicin 10) mint- helpful for headache, muscle aches, joint pain, and itching 11) garlic- reduces inflammation  Link to further information on diet for chronic pain: http://www.bray.com/    2) Daytime somnolence: -discussed that B12 deficiency could contribute -recommended B12, methylfolate, and B6 combined supplement -check vitamin B12, folate, testosterone levels  3) Low HDL: -discussed that lack of exercise can contribute to this -provided list of foods to help increase HDL  4) Polypharmacy:  -discussed his desire to be on less medication -discussed that Protonix can be cause of the B12 deficiency.   5) Diabetic peripheral neuropathy: -Discussed Qutenza as an option for neuropathic pain control. Discussed that this is a capsaicin patch, stronger than capsaicin cream. Discussed that it is currently approved for diabetic peripheral neuropathy and post-herpetic neuralgia, but that it has also shown benefit in treating other forms of neuropathy. Provided patient with link to site to learn more about the patch: https://www.clark.biz/. Discussed that the patch would be placed in office and benefits usually last 3 months. Discussed that unintended exposure to capsaicin can cause severe irritation of eyes, mucous membranes, respiratory tract, and skin, but that Qutenza is a local treatment and does not have the  systemic side effects of other nerve medications. Discussed that there may be pain, itching, erythema, and decreased sensory function associated with the application of Qutenza. Side effects usually subside within 1 week. A cold pack of analgesic medications can help with these side effects. Blood pressure can also be increased due to pain associated with administration of the patch.   5 minutes spent in discussion of his B12 and folate, vitamin D deficiency, discussed using Qutenza for low back pain and for his peripheral neuropathy

## 2022-09-08 ENCOUNTER — Telehealth: Payer: Self-pay

## 2022-09-08 NOTE — Telephone Encounter (Signed)
PA submitted for Qutenza Patches [name] (Key: [id number])

## 2022-09-09 DIAGNOSIS — G4733 Obstructive sleep apnea (adult) (pediatric): Secondary | ICD-10-CM | POA: Diagnosis not present

## 2022-09-09 DIAGNOSIS — J45901 Unspecified asthma with (acute) exacerbation: Secondary | ICD-10-CM | POA: Diagnosis not present

## 2022-09-10 DIAGNOSIS — M5126 Other intervertebral disc displacement, lumbar region: Secondary | ICD-10-CM | POA: Diagnosis not present

## 2022-09-15 ENCOUNTER — Telehealth: Payer: Self-pay | Admitting: *Deleted

## 2022-09-15 NOTE — Telephone Encounter (Signed)
Patient received Zynex Nexwave he is inquiring about how the device is used. His next ov 6/13 he wanted to know if he can come into office and be shown how to hook up.

## 2022-09-15 NOTE — Telephone Encounter (Signed)
Patient aware.

## 2022-10-05 DIAGNOSIS — G4733 Obstructive sleep apnea (adult) (pediatric): Secondary | ICD-10-CM | POA: Diagnosis not present

## 2022-10-09 ENCOUNTER — Ambulatory Visit
Admission: RE | Admit: 2022-10-09 | Discharge: 2022-10-09 | Disposition: A | Discharge status: Home / Self Care | Payer: Medicare PPO | Source: Ambulatory Visit | Attending: Physical Medicine and Rehabilitation | Admitting: Physical Medicine and Rehabilitation

## 2022-10-09 ENCOUNTER — Encounter: Payer: Medicare PPO | Attending: Physical Medicine and Rehabilitation | Admitting: Physical Medicine and Rehabilitation

## 2022-10-09 VITALS — BP 133/80 | HR 94 | Ht 71.0 in | Wt 225.0 lb

## 2022-10-09 DIAGNOSIS — E1342 Other specified diabetes mellitus with diabetic polyneuropathy: Secondary | ICD-10-CM | POA: Insufficient documentation

## 2022-10-09 DIAGNOSIS — M5126 Other intervertebral disc displacement, lumbar region: Secondary | ICD-10-CM | POA: Insufficient documentation

## 2022-10-09 DIAGNOSIS — R400 Somnolence: Secondary | ICD-10-CM | POA: Insufficient documentation

## 2022-10-09 DIAGNOSIS — M545 Low back pain, unspecified: Secondary | ICD-10-CM | POA: Diagnosis not present

## 2022-10-09 MED ORDER — CAPSAICIN-CLEANSING GEL 8 % EX KIT
4.0000 | PACK | Freq: Once | CUTANEOUS | Status: AC
  Administered 2022-10-09: 4 via TOPICAL

## 2022-10-09 NOTE — Progress Notes (Signed)
Subjective:    Patient ID: [name], male    DOB: 1940-12-19, 82 y.o.   MRN: [id number]  HPI  [name] is an 82 year old man who presents to establish care for herniated disc pain.  1) Herniated lumbar spine discs -has been severe at times -ready to try Qutenza today -he would like to get repeat imaging of his back -likes the pain journal -steroid injections no longer work- he used to get them from Dr. Ethelene Hal -he feels there has got to be sometime else that can be done.  -he respects that Dr. Renne Crigler is is not a pill pusher, respects that he is a physician first -last steroid injection seemed to last hardly at all -recently the pain has been severe. -stopped the naproxen -he continues to use tylenol and using heating pad  2) Megaloblastic anemia: -he is not sure if B12 and folate were checked   Pain Inventory Average Pain 4 Pain Right Now 7 My pain is aching  In the last 24 hours, has pain interfered with the following? General activity 4 Relation with others 4 Enjoyment of life 6 What TIME of day is your pain at its worst? morning  Sleep (in general) Good  Pain is worse with: bending and some activites Pain improves with: rest and medication Relief from Meds: 7  walk without assistance how many minutes can you walk? 20 minutes ability to climb steps?  yes do you drive?  yes Do you have any goals in this area?  yes  not employed: date last employed 10-22 I need assistance with the following:  household duties and shopping Do you have any goals in this area?  yes  bladder control problems  Any changes since last visit?  no  Any changes since last visit?  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  Tobacco Use   Smoking status: Former    Packs/day: 3.00    Years: 33.00    Additional pack years: 0.00    Total pack years: 99.00    Types: Pipe, Cigars, Cigarettes    Quit date: 04/28/1985    Years since quitting: 37.4   Smokeless tobacco: Never  Vaping Use   Vaping Use: Never used  Substance and Sexual Activity   Alcohol use: Yes    Alcohol/week: 2.0 standard drinks of alcohol    Types: 1 Glasses of wine, 1 Shots of liquor per week    Comment: rare   Drug use: No   Sexual activity: Not on file  Other Topics Concern   Not on file  Social History Narrative   04/11/21 Lives with wife   Coffee, tea   Social Determinants of Health   Financial Resource Strain: Not on file  Food Insecurity: Not on file  Transportation Needs: Not on file  Physical Activity: Not on file  Stress: Not on file  Social Connections: Not on file   Past  Surgical History:  Procedure Laterality Date   CATARACT EXTRACTION Left 09/2021   COLONOSCOPY  03/2017   CORONARY ANGIOPLASTY WITH STENT PLACEMENT  08/15/2014   PCI of pLAD Xience alpine DES   EYE SURGERY  01/2017   eyelids lifted   LEFT HEART CATHETERIZATION WITH CORONARY ANGIOGRAM N/A 08/15/2014   Procedure: LEFT HEART CATHETERIZATION WITH CORONARY ANGIOGRAM;  Surgeon: Peter M Swaziland, MD;  Location: [facility] CATH LAB;  Service: Cardiovascular;  Laterality: N/A;   left knee arthroscopy Left 1994   NASAL POLYP EXCISION  1953   NASAL SINUS SURGERY     deviated septum   TONSILLECTOMY     TOTAL HIP ARTHROPLASTY Right 06/22/2017   Procedure: RIGHT TOTAL HIP ARTHROPLASTY ANTERIOR APPROACH;  Surgeon: Samson Frederic, MD;  Location: [facility] OR;  Service: Orthopedics;  Laterality: Right;  Needs RNFA   TOTAL HIP REVISION Right 07/10/2017   Procedure: RIGHT TOTAL HIP REVISION FEMORAL COMPONENT;  Surgeon:  Samson Frederic, MD;  Location: [facility] OR;  Service: Orthopedics;  Laterality: Right;  Needs RNFA   Past Medical History:  Diagnosis Date   Allergic asthma    Barrett's esophagus    BPH (benign prostatic hyperplasia)    CAD (coronary artery disease)    CAD (coronary atherosclerotic disease)    Confusion    COPD (chronic obstructive pulmonary disease) (HCC)    Diabetes (HCC)    GERD (gastroesophageal reflux disease)    Heart attack (HCC)    x2   Hiatal hernia    Hyperlipidemia    Hypertension    Hypothyroidism    Irregular heart rhythm    Polypharmacy    Primary hypertension 08/14/2014   Psoriasis    Sleep apnea    wears CPAP nightly   BP 133/80   Pulse 94   Ht 5\' 11"  (1.803 m)   Wt 225 lb (102.1 kg)   SpO2 95%   BMI 31.38 kg/m   Opioid Risk Score:   Fall Risk Score:  `1  Depression screen [facility] 2/9     10/09/2022    9:44 AM 09/01/2022    9:15 AM  Depression screen PHQ 2/9  Decreased Interest 1 1  Down, Depressed, Hopeless 1 1  PHQ - 2 Score 2 2  Altered sleeping  0  Tired, decreased energy  2  Change in appetite  2  Feeling bad or failure about yourself   0  Trouble concentrating  0  Moving slowly or fidgety/restless  0  Suicidal thoughts  0  PHQ-9 Score  6  Difficult doing work/chores  Not difficult at all      Review of Systems  Musculoskeletal:  Positive for back pain.  All other systems reviewed and are negative.      Objective:   Physical Exam Gen: no distress, normal appearing HEENT: oral mucosa pink and moist, NCAT Cardio: Reg rate Chest: normal effort, normal rate of breathing Abd: soft, non-distended Ext: no edema Psych: pleasant, normal affect Skin: intact Neuro: Alert and oriented x3 MSK: spinal pain removed with movement     Assessment & Plan:   1) Chronic Pain Syndrome secondary to herniated lumbar spine discs -discussed his current pain regimen -continue walking, recommended increasing 4 times per week 30 minutes -encouraged  walking with his friend  -discussed aquatherapy  -encouraged heating pad since that sends the blood flow to the area.   -Discussed Qutenza as an option for neuropathic pain control. Discussed that this is a capsaicin patch, stronger than capsaicin cream. Discussed  that it is currently approved for diabetic peripheral neuropathy and post-herpetic neuralgia, but that it has also shown benefit in treating other forms of neuropathy. Provided patient with link to site to learn more about the patch: https://www.clark.biz/. Discussed that the patch would be placed in office and benefits usually last 3 months. Discussed that unintended exposure to capsaicin can cause severe irritation of eyes, mucous membranes, respiratory tract, and skin, but that Qutenza is a local treatment and does not have the systemic side effects of other nerve medications. Discussed that there may be pain, itching, erythema, and decreased sensory function associated with the application of Qutenza. Side effects usually subside within 1 week. A cold pack of analgesic medications can help with these side effects. Blood pressure can also be increased due to pain associated with administration of the patch.    Prescribed Zynex Nexwave, asked schedulers to make sure CJ is present next appointment   -Provided with a pain relief journal and discussed that it contains foods and lifestyle tips to naturally help to improve pain. Discussed that these lifestyle strategies are also very good for health unlike some medications which can have negative side effects. Discussed that the act of keeping a journal can be therapeutic and helpful to realize patterns what helps to trigger and alleviate pain.    -Discussed current symptoms of pain and history of pain.  -Discussed benefits of exercise in reducing pain. -Discussed following foods that may reduce pain: 1) Ginger (especially studied for arthritis)- reduce leukotriene production to decrease  inflammation 2) Blueberries- high in phytonutrients that decrease inflammation 3) Salmon- marine omega-3s reduce joint swelling and pain 4) Pumpkin seeds- reduce inflammation 5) dark chocolate- reduces inflammation 6) turmeric- reduces inflammation 7) tart cherries - reduce pain and stiffness 8) extra virgin olive oil - its compound olecanthal helps to block prostaglandins  9) chili peppers- can be eaten or applied topically via capsaicin 10) mint- helpful for headache, muscle aches, joint pain, and itching 11) garlic- reduces inflammation  Link to further information on diet for chronic pain: http://www.bray.com/    2) Daytime somnolence: -discussed that B12 deficiency could contribute -recommended B12, methylfolate, and B6 combined supplement -check vitamin B12, folate, testosterone levels -discussed that he completed prescription vitamin D -stop the melatonin -try a cup of tart cherry juice with dinner  3) Low HDL: -discussed that lack of exercise can contribute to this -provided list of foods to help increase HDL  4) Polypharmacy:  -discussed his desire to be on less medication -discussed that Protonix can be cause of the B12 deficiency.   5) Diabetic peripheral neuropathy: -Discussed Qutenza as an option for neuropathic pain control. Discussed that this is a capsaicin patch, stronger than capsaicin cream. Discussed that it is currently approved for diabetic peripheral neuropathy and post-herpetic neuralgia, but that it has also shown benefit in treating other forms of neuropathy. Provided patient with link to site to learn more about the patch: https://www.clark.biz/. Discussed that the patch would be placed in office and benefits usually last 3 months. Discussed that unintended exposure to capsaicin can cause severe irritation of eyes, mucous membranes, respiratory tract, and skin, but that Qutenza is a  local treatment and does not have the systemic side effects of other nerve medications. Discussed that there may be pain, itching, erythema, and decreased sensory function associated with the application of Qutenza. Side effects usually subside within 1 week. A cold pack of analgesic medications can help with these side effects. Blood pressure can also  be increased due to pain associated with administration of the patch.   -discussed that he feels there is a sheet of paper on his feet  40 minutes spent in discussion of risks and benefits of Qutenza and obtaining informed consent, discussion of q90 day follow-up and expectation of improvement in pain with each repeat application, discussed that he finished the vitamin D supplement, discussed that he feels melatonin may be making him too sleepy

## 2022-10-09 NOTE — Addendum Note (Signed)
Addended by: Janean Sark on: 10/09/2022 11:45 AM   Modules accepted: Orders

## 2022-10-11 DIAGNOSIS — M5126 Other intervertebral disc displacement, lumbar region: Secondary | ICD-10-CM | POA: Diagnosis not present

## 2022-10-16 ENCOUNTER — Ambulatory Visit (HOSPITAL_BASED_OUTPATIENT_CLINIC_OR_DEPARTMENT_OTHER): Payer: Medicare PPO | Attending: Physical Medicine and Rehabilitation | Admitting: Physical Therapy

## 2022-10-28 ENCOUNTER — Encounter: Payer: Medicare PPO | Attending: Physical Medicine and Rehabilitation | Admitting: Physical Medicine and Rehabilitation

## 2022-10-28 DIAGNOSIS — M545 Low back pain, unspecified: Secondary | ICD-10-CM | POA: Insufficient documentation

## 2022-10-28 DIAGNOSIS — E1342 Other specified diabetes mellitus with diabetic polyneuropathy: Secondary | ICD-10-CM | POA: Insufficient documentation

## 2022-10-28 DIAGNOSIS — M5126 Other intervertebral disc displacement, lumbar region: Secondary | ICD-10-CM | POA: Insufficient documentation

## 2022-10-28 DIAGNOSIS — M25512 Pain in left shoulder: Secondary | ICD-10-CM | POA: Insufficient documentation

## 2022-10-28 NOTE — Progress Notes (Signed)
Subjective:    Patient ID: [name], male    DOB: 01-09-1941, 82 y.o.   MRN: [id number]  HPI An audio/video tele-health visit is felt to be the most appropriate encounter for this patient at this time. This is a follow up tele-visit via phone. The patient is at home. MD is at office. Prior to scheduling this appointment, our staff discussed the limitations of evaluation and management by telemedicine and the availability of in-person appointments. The patient expressed understanding and agreed to proceed.   [name] is an 82 year old man who presents to establish care for herniated disc pain.  1) Herniated lumbar spine discs -has been severe at times -Qutenza helped! -has not heard of red light therapy -he would like to get repeat imaging of his back -likes the pain journal -steroid injections no longer work- he used to get them from Dr. Ethelene Hal -he feels there has got to be sometime else that can be done.  -he respects that Dr. Renne Crigler is is not a pill pusher, respects that he is a physician first -last steroid injection seemed to last hardly at all -recently the pain has been severe. -stopped the naproxen -he continues to use tylenol and using heating pad  2) Megaloblastic anemia: -he is not sure if B12 and folate were checked   Pain Inventory Average Pain 4 Pain Right Now 7 My pain is aching  In the last 24 hours, has pain interfered with the following? General activity 4 Relation with others 4 Enjoyment of life 6 What TIME of day is your pain at its worst? morning  Sleep (in general) Good  Pain is worse with: bending and some activites Pain improves with: rest and medication Relief from Meds: 7  walk without assistance how many minutes can you walk? 20 minutes ability to climb steps?  yes do you drive?  yes Do you have any goals in this area?  yes  not employed: date last employed 10-22 I need assistance with the following:  household duties and shopping Do  you have any goals in this area?  yes  bladder control problems  Any changes since last visit?  no  Any changes since last visit?  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  Tobacco Use   Smoking status: Former    Packs/day: 3.00    Years: 33.00    Additional pack years: 0.00    Total pack years: 99.00    Types: Pipe, Cigars, Cigarettes    Quit date: 04/28/1985    Years since quitting: 37.5   Smokeless tobacco: Never  Vaping Use   Vaping Use: Never used  Substance and Sexual Activity   Alcohol use: Yes    Alcohol/week: 2.0 standard drinks of alcohol    Types: 1 Glasses of wine, 1 Shots of liquor per week    Comment: rare   Drug use: No   Sexual activity: Not on  file  Other Topics Concern   Not on file  Social History Narrative   04/11/21 Lives with wife   Coffee, tea   Social Determinants of Health   Financial Resource Strain: Not on file  Food Insecurity: Not on file  Transportation Needs: Not on file  Physical Activity: Not on file  Stress: Not on file  Social Connections: Not on file   Past Surgical History:  Procedure Laterality Date   CATARACT EXTRACTION Left 09/2021   COLONOSCOPY  03/2017   CORONARY ANGIOPLASTY WITH STENT PLACEMENT  08/15/2014   PCI of pLAD Xience alpine DES   EYE SURGERY  01/2017   eyelids lifted   LEFT HEART CATHETERIZATION WITH CORONARY ANGIOGRAM N/A 08/15/2014   Procedure: LEFT HEART CATHETERIZATION WITH CORONARY ANGIOGRAM;  Surgeon: Peter M Swaziland, MD;  Location: [facility] CATH LAB;  Service: Cardiovascular;  Laterality: N/A;   left  knee arthroscopy Left 1994   NASAL POLYP EXCISION  1953   NASAL SINUS SURGERY     deviated septum   TONSILLECTOMY     TOTAL HIP ARTHROPLASTY Right 06/22/2017   Procedure: RIGHT TOTAL HIP ARTHROPLASTY ANTERIOR APPROACH;  Surgeon: Samson Frederic, MD;  Location: [facility] OR;  Service: Orthopedics;  Laterality: Right;  Needs RNFA   TOTAL HIP REVISION Right 07/10/2017   Procedure: RIGHT TOTAL HIP REVISION FEMORAL COMPONENT;  Surgeon: Samson Frederic, MD;  Location: [facility] OR;  Service: Orthopedics;  Laterality: Right;  Needs RNFA   Past Medical History:  Diagnosis Date   Allergic asthma    Barrett's esophagus    BPH (benign prostatic hyperplasia)    CAD (coronary artery disease)    CAD (coronary atherosclerotic disease)    Confusion    COPD (chronic obstructive pulmonary disease) (HCC)    Diabetes (HCC)    GERD (gastroesophageal reflux disease)    Heart attack (HCC)    x2   Hiatal hernia    Hyperlipidemia    Hypertension    Hypothyroidism    Irregular heart rhythm    Polypharmacy    Primary hypertension 08/14/2014   Psoriasis    Sleep apnea    wears CPAP nightly   There were no vitals taken for this visit.  Opioid Risk Score:   Fall Risk Score:  `1  Depression screen [facility] 2/9     10/09/2022    9:44 AM 09/01/2022    9:15 AM  Depression screen PHQ 2/9  Decreased Interest 1 1  Down, Depressed, Hopeless 1 1  PHQ - 2 Score 2 2  Altered sleeping  0  Tired, decreased energy  2  Change in appetite  2  Feeling bad or failure about yourself   0  Trouble concentrating  0  Moving slowly or fidgety/restless  0  Suicidal thoughts  0  PHQ-9 Score  6  Difficult doing work/chores  Not difficult at all      Review of Systems  Musculoskeletal:  Positive for back pain.  All other systems reviewed and are negative.      Objective:   Physical Exam Not performed     Assessment & Plan:   1) Chronic Pain Syndrome secondary to herniated lumbar spine discs -discussed his current pain  regimen -continue walking, recommended increasing 4 times per week 30 minutes -encouraged walking with his friend  -discussed aquatherapy  -encouraged red light therapy  -encouraged heating pad since that sends the blood flow to the area.   -Discussed Qutenza as an option for neuropathic pain control. Discussed that  this is a capsaicin patch, stronger than capsaicin cream. Discussed that it is currently approved for diabetic peripheral neuropathy and post-herpetic neuralgia, but that it has also shown benefit in treating other forms of neuropathy. Provided patient with link to site to learn more about the patch: https://www.clark.biz/. Discussed that the patch would be placed in office and benefits usually last 3 months. Discussed that unintended exposure to capsaicin can cause severe irritation of eyes, mucous membranes, respiratory tract, and skin, but that Qutenza is a local treatment and does not have the systemic side effects of other nerve medications. Discussed that there may be pain, itching, erythema, and decreased sensory function associated with the application of Qutenza. Side effects usually subside within 1 week. A cold pack of analgesic medications can help with these side effects. Blood pressure can also be increased due to pain associated with administration of the patch.   Prescribed Zynex Nexwave, asked schedulers to make sure CJ is present next appointment   -Provided with a pain relief journal and discussed that it contains foods and lifestyle tips to naturally help to improve pain. Discussed that these lifestyle strategies are also very good for health unlike some medications which can have negative side effects. Discussed that the act of keeping a journal can be therapeutic and helpful to realize patterns what helps to trigger and alleviate pain.    -Discussed current symptoms of pain and history of pain.  -Discussed benefits of exercise in reducing pain. -Discussed  following foods that may reduce pain: 1) Ginger (especially studied for arthritis)- reduce leukotriene production to decrease inflammation 2) Blueberries- high in phytonutrients that decrease inflammation 3) Salmon- marine omega-3s reduce joint swelling and pain 4) Pumpkin seeds- reduce inflammation 5) dark chocolate- reduces inflammation 6) turmeric- reduces inflammation 7) tart cherries - reduce pain and stiffness 8) extra virgin olive oil - its compound olecanthal helps to block prostaglandins  9) chili peppers- can be eaten or applied topically via capsaicin 10) mint- helpful for headache, muscle aches, joint pain, and itching 11) garlic- reduces inflammation  Link to further information on diet for chronic pain: http://www.bray.com/    2) Daytime somnolence: -discussed that B12 deficiency could contribute -recommended B12, methylfolate, and B6 combined supplement -check vitamin B12, folate, testosterone levels -discussed that he completed prescription vitamin D -stop the melatonin -try a cup of tart cherry juice with dinner  3) Low HDL: -discussed that lack of exercise can contribute to this -provided list of foods to help increase HDL  4) Polypharmacy:  -discussed his desire to be on less medication -discussed that Protonix can be cause of the B12 deficiency.   5) Diabetic peripheral neuropathy: -Discussed Qutenza as an option for neuropathic pain control. Discussed that this is a capsaicin patch, stronger than capsaicin cream. Discussed that it is currently approved for diabetic peripheral neuropathy and post-herpetic neuralgia, but that it has also shown benefit in treating other forms of neuropathy. Provided patient with link to site to learn more about the patch: https://www.clark.biz/. Discussed that the patch would be placed in office and benefits usually last 3 months. Discussed that unintended  exposure to capsaicin can cause severe irritation of eyes, mucous membranes, respiratory tract, and skin, but that Qutenza is a local treatment and does not have the systemic side effects of other nerve medications. Discussed that there may be pain, itching, erythema, and decreased sensory function associated with the application of Qutenza. Side effects usually subside within 1 week. A cold pack of analgesic medications can  help with these side effects. Blood pressure can also be increased due to pain associated with administration of the patch.    -discussed that he feels there is a sheet of paper on his feet  7 minutes spent in discussing the benefits of bicycling, discussing XR results, discussing response to Qutenza

## 2022-11-04 DIAGNOSIS — G4733 Obstructive sleep apnea (adult) (pediatric): Secondary | ICD-10-CM | POA: Diagnosis not present

## 2022-11-06 ENCOUNTER — Other Ambulatory Visit: Payer: Self-pay | Admitting: Physical Medicine and Rehabilitation

## 2022-11-06 DIAGNOSIS — F4310 Post-traumatic stress disorder, unspecified: Secondary | ICD-10-CM | POA: Diagnosis not present

## 2022-11-06 DIAGNOSIS — Z79899 Other long term (current) drug therapy: Secondary | ICD-10-CM | POA: Diagnosis not present

## 2022-11-06 DIAGNOSIS — F411 Generalized anxiety disorder: Secondary | ICD-10-CM | POA: Diagnosis not present

## 2022-11-06 DIAGNOSIS — Z5181 Encounter for therapeutic drug level monitoring: Secondary | ICD-10-CM | POA: Diagnosis not present

## 2022-11-06 DIAGNOSIS — F332 Major depressive disorder, recurrent severe without psychotic features: Secondary | ICD-10-CM | POA: Diagnosis not present

## 2022-11-10 ENCOUNTER — Encounter: Payer: Medicare PPO | Admitting: Physical Medicine and Rehabilitation

## 2022-11-10 ENCOUNTER — Encounter: Payer: Self-pay | Admitting: Physical Medicine and Rehabilitation

## 2022-11-10 VITALS — BP 115/77 | HR 90 | Ht 71.0 in | Wt 232.0 lb

## 2022-11-10 DIAGNOSIS — M545 Low back pain, unspecified: Secondary | ICD-10-CM

## 2022-11-10 DIAGNOSIS — M25512 Pain in left shoulder: Secondary | ICD-10-CM | POA: Diagnosis not present

## 2022-11-10 DIAGNOSIS — G894 Chronic pain syndrome: Secondary | ICD-10-CM | POA: Diagnosis not present

## 2022-11-10 DIAGNOSIS — M5126 Other intervertebral disc displacement, lumbar region: Secondary | ICD-10-CM | POA: Diagnosis not present

## 2022-11-10 DIAGNOSIS — E1342 Other specified diabetes mellitus with diabetic polyneuropathy: Secondary | ICD-10-CM | POA: Diagnosis not present

## 2022-11-10 NOTE — Progress Notes (Signed)
Subjective:    Patient ID: [name], male    DOB: [birth year], 82 y.o.   MRN: [id number]  HPI Mr. Wisenbaker is an 82 year old man who presents to establish care for herniated disc pain.   1) Herniated lumbar spine discs -has been severe at times -spinal pain has greatly improved! -tramadol helps -would like to try tens unit -Qutenza helped! -has not heard of red light therapy -he would like to get repeat imaging of his back -likes the pain journal -steroid injections no longer work- he used to get them from Dr. Ethelene Hal -he feels there has got to be sometime else that can be done.  -he respects that Dr. Renne Crigler is is not a pill pusher, respects that he is a physician first -last steroid injection seemed to last hardly at all -recently the pain has been severe. -stopped the naproxen -he continues to use tylenol and using heating pad   2) Megaloblastic anemia: -he is not sure if B12 and folate were checked  3) Acute left shoulder pain: -started when he was just sitting    Pain Inventory Average Pain  left shoulder 10 sharp, back 3 Pain Right Now 2 My pain is intermittent and constant  In the last 24 hours, has pain interfered with the following? General activity 0 Relation with others 0 Enjoyment of life  3 What TIME of day is your pain at its worst? 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  Tobacco Use   Smoking status:  Former    Current packs/day: 0.00    Average packs/day: 3.0 packs/day for 33.0 years (99.0 ttl pk-yrs)    Types: Pipe, Cigars, Cigarettes    Start date: 04/28/1952    Quit date: 04/28/1985    Years since quitting: 37.5   Smokeless tobacco: Never  Vaping Use   Vaping status: Never Used  Substance and Sexual Activity   Alcohol use: Yes    Alcohol/week: 2.0 standard drinks of alcohol    Types: 1 Glasses of wine, 1 Shots of liquor per week    Comment: rare   Drug use: No   Sexual activity: Not on file  Other Topics Concern   Not on file  Social History Narrative   04/11/21 Lives with wife   Coffee, tea   Social Determinants of Health   Financial Resource Strain: Low Risk  (03/18/2021)   Received from [facility]   Overall Financial Resource Strain (CARDIA)    Difficulty of Paying Living Expenses: Not very hard  Food Insecurity: Not on file  Transportation Needs: Not on file  Physical Activity: Not on file  Stress: Stress Concern Present (03/18/2021)   Received from [facility] - Occupational Stress Questionnaire    Feeling of Stress :  To some extent  Social Connections: Unknown (09/10/2021)   Received from [facility]   Social Network    Social Network: Not on file   Past Surgical History:  Procedure Laterality Date   CATARACT EXTRACTION Left 09/2021   COLONOSCOPY  03/2017   CORONARY ANGIOPLASTY WITH STENT PLACEMENT  08/15/2014   PCI of pLAD Xience alpine DES   EYE SURGERY  01/2017   eyelids lifted   LEFT HEART CATHETERIZATION WITH CORONARY ANGIOGRAM N/A 08/15/2014   Procedure: LEFT HEART CATHETERIZATION WITH CORONARY ANGIOGRAM;  Surgeon: Peter M Swaziland, MD;  Location: [facility] CATH LAB;  Service: Cardiovascular;  Laterality: N/A;   left knee arthroscopy Left 1994   NASAL POLYP EXCISION  1953   NASAL SINUS SURGERY     deviated septum   TONSILLECTOMY     TOTAL HIP ARTHROPLASTY Right 06/22/2017   Procedure: RIGHT TOTAL HIP  ARTHROPLASTY ANTERIOR APPROACH;  Surgeon: Samson Frederic, MD;  Location: [facility] OR;  Service: Orthopedics;  Laterality: Right;  Needs RNFA   TOTAL HIP REVISION Right 07/10/2017   Procedure: RIGHT TOTAL HIP REVISION FEMORAL COMPONENT;  Surgeon: Samson Frederic, MD;  Location: [facility] OR;  Service: Orthopedics;  Laterality: Right;  Needs RNFA   Past Surgical History:  Procedure Laterality Date   CATARACT EXTRACTION Left 09/2021   COLONOSCOPY  03/2017   CORONARY ANGIOPLASTY WITH STENT PLACEMENT  08/15/2014   PCI of pLAD Xience alpine DES   EYE SURGERY  01/2017   eyelids lifted   LEFT HEART CATHETERIZATION WITH CORONARY ANGIOGRAM N/A 08/15/2014   Procedure: LEFT HEART CATHETERIZATION WITH CORONARY ANGIOGRAM;  Surgeon: Peter M Swaziland, MD;  Location: [facility] CATH LAB;  Service: Cardiovascular;  Laterality: N/A;   left knee arthroscopy Left 1994   NASAL POLYP EXCISION  1953   NASAL SINUS SURGERY     deviated septum   TONSILLECTOMY     TOTAL HIP ARTHROPLASTY Right 06/22/2017   Procedure: RIGHT TOTAL HIP ARTHROPLASTY ANTERIOR APPROACH;  Surgeon: Samson Frederic, MD;  Location: [facility] OR;  Service: Orthopedics;  Laterality: Right;  Needs RNFA   TOTAL HIP REVISION Right 07/10/2017   Procedure: RIGHT TOTAL HIP REVISION FEMORAL COMPONENT;  Surgeon: Samson Frederic, MD;  Location: [facility] OR;  Service: Orthopedics;  Laterality: Right;  Needs RNFA   Past Medical History:  Diagnosis Date   Allergic asthma    Barrett's esophagus    BPH (benign prostatic hyperplasia)    CAD (coronary artery disease)    CAD (coronary atherosclerotic disease)    Confusion    COPD (chronic obstructive pulmonary disease) (HCC)    Diabetes (HCC)    GERD (gastroesophageal reflux disease)    Heart attack (HCC)    x2   Hiatal hernia    Hyperlipidemia    Hypertension    Hypothyroidism    Irregular heart rhythm    Polypharmacy    Primary hypertension 08/14/2014   Psoriasis    Sleep apnea    wears CPAP nightly   Ht 5\' 11"  (1.803 m)    Wt 232 lb (105.2 kg)   BMI 32.36 kg/m   Opioid Risk Score:   Fall Risk Score:  `1  Depression screen [facility] 2/9     11/10/2022   11:25 AM 10/09/2022    9:44 AM 09/01/2022    9:15 AM  Depression screen PHQ 2/9  Decreased Interest 1 1 1   Down, Depressed, Hopeless 1 1 1   PHQ - 2 Score 2 2 2   Altered sleeping   0  Tired,  decreased energy   2  Change in appetite   2  Feeling bad or failure about yourself    0  Trouble concentrating   0  Moving slowly or fidgety/restless   0  Suicidal thoughts   0  PHQ-9 Score   6  Difficult doing work/chores   Not difficult at all    Review of Systems     Objective:   Physical Exam Gen: no distress, normal appearing HEENT: oral mucosa pink and moist, NCAT Cardio: Reg rate Chest: normal effort, normal rate of breathing Abd: soft, non-distended Ext: no edema Psych: pleasant, normal affect Skin: intact Neuro: Alert and oriented x3 Musculoskeletal: L shoulder with full ROM       Assessment & Plan:   1) Chronic Pain Syndrome secondary to herniated lumbar spine discs -discussed his current pain regimen -continue walking, recommended increasing 4 times per week 30 minutes -encouraged walking with his friend  -discussed aquatherapy   -encouraged red light therapy   -encouraged heating pad since that sends the blood flow to the area.   -Discussed Qutenza as an option for neuropathic pain control. Discussed that this is a capsaicin patch, stronger than capsaicin cream. Discussed that it is currently approved for diabetic peripheral neuropathy and post-herpetic neuralgia, but that it has also shown benefit in treating other forms of neuropathy. Provided patient with link to site to learn more about the patch: https://www.clark.biz/. Discussed that the patch would be placed in office and benefits usually last 3 months. Discussed that unintended exposure to capsaicin can cause severe irritation of eyes, mucous membranes, respiratory tract, and  skin, but that Qutenza is a local treatment and does not have the systemic side effects of other nerve medications. Discussed that there may be pain, itching, erythema, and decreased sensory function associated with the application of Qutenza. Side effects usually subside within 1 week. A cold pack of analgesic medications can help with these side effects. Blood pressure can also be increased due to pain associated with administration of the patch.    Prescribed Zynex Nexwave, asked CJ to teach patient to use device   -Provided with a pain relief journal and discussed that it contains foods and lifestyle tips to naturally help to improve pain. Discussed that these lifestyle strategies are also very good for health unlike some medications which can have negative side effects. Discussed that the act of keeping a journal can be therapeutic and helpful to realize patterns what helps to trigger and alleviate pain.     -Discussed current symptoms of pain and history of pain.  -Discussed benefits of exercise in reducing pain. -Discussed following foods that may reduce pain: 1) Ginger (especially studied for arthritis)- reduce leukotriene production to decrease inflammation 2) Blueberries- high in phytonutrients that decrease inflammation 3) Salmon- marine omega-3s reduce joint swelling and pain 4) Pumpkin seeds- reduce inflammation 5) dark chocolate- reduces inflammation 6) turmeric- reduces inflammation 7) tart cherries - reduce pain and stiffness 8) extra virgin olive oil - its compound olecanthal helps to block prostaglandins  9) chili peppers- can be eaten or applied topically via capsaicin 10) mint- helpful for headache, muscle aches, joint pain, and itching 11) garlic- reduces inflammation   Link to further information on diet for chronic pain: http://www.bray.com/      2) Daytime somnolence: -discussed that B12 deficiency  could contribute -recommended B12, methylfolate, and B6 combined supplement -check vitamin B12, folate, testosterone levels -discussed that he completed prescription vitamin D -stop the melatonin -try a  cup of tart cherry juice with dinner   3) Low HDL: -discussed that lack of exercise can contribute to this -provided list of foods to help increase HDL   4) Polypharmacy:  -discussed his desire to be on less medication -discussed that Protonix can be cause of the B12 deficiency.    5) Diabetic peripheral neuropathy: -Discussed Qutenza as an option for neuropathic pain control. Discussed that this is a capsaicin patch, stronger than capsaicin cream. Discussed that it is currently approved for diabetic peripheral neuropathy and post-herpetic neuralgia, but that it has also shown benefit in treating other forms of neuropathy. Provided patient with link to site to learn more about the patch: https://www.clark.biz/. Discussed that the patch would be placed in office and benefits usually last 3 months. Discussed that unintended exposure to capsaicin can cause severe irritation of eyes, mucous membranes, respiratory tract, and skin, but that Qutenza is a local treatment and does not have the systemic side effects of other nerve medications. Discussed that there may be pain, itching, erythema, and decreased sensory function associated with the application of Qutenza. Side effects usually subside within 1 week. A cold pack of analgesic medications can help with these side effects. Blood pressure can also be increased due to pain associated with administration of the patch.     -discussed that he feels there is a sheet of paper on his feet  6) Left shoulder pain:  -discussed that may have been a strain, advised that it may heal on its own, and to let me know if it doesn't and if he is interested in PT

## 2022-11-12 ENCOUNTER — Ambulatory Visit: Payer: Medicare PPO | Attending: Physical Medicine and Rehabilitation | Admitting: Physical Therapy

## 2022-11-12 ENCOUNTER — Other Ambulatory Visit: Payer: Self-pay | Admitting: Physical Medicine and Rehabilitation

## 2022-11-12 ENCOUNTER — Telehealth: Payer: Self-pay | Admitting: Physical Therapy

## 2022-11-12 DIAGNOSIS — M5126 Other intervertebral disc displacement, lumbar region: Secondary | ICD-10-CM | POA: Diagnosis not present

## 2022-11-12 DIAGNOSIS — M5459 Other low back pain: Secondary | ICD-10-CM | POA: Diagnosis not present

## 2022-11-12 DIAGNOSIS — M25512 Pain in left shoulder: Secondary | ICD-10-CM | POA: Insufficient documentation

## 2022-11-12 DIAGNOSIS — R2681 Unsteadiness on feet: Secondary | ICD-10-CM | POA: Diagnosis not present

## 2022-11-12 DIAGNOSIS — M6281 Muscle weakness (generalized): Secondary | ICD-10-CM | POA: Diagnosis not present

## 2022-11-12 DIAGNOSIS — R293 Abnormal posture: Secondary | ICD-10-CM | POA: Insufficient documentation

## 2022-11-12 NOTE — Therapy (Signed)
OUTPATIENT PHYSICAL THERAPY THORACOLUMBAR EVALUATION   Patient Name: [name] MRN: [id number] DOB:[birth year], 82 y.o., male Today's Date: 11/12/2022  END OF SESSION:  PT End of Session - 11/12/22 1036     Visit Number 1    Number of Visits 7   Plus eval   Date for PT Re-Evaluation 01/07/23   Due to potential delay in scheduling   Authorization Type [employer]    PT Start Time 1033    PT Stop Time 1114    PT Time Calculation (min) 41 min    Activity Tolerance Patient tolerated treatment well    Behavior During Therapy [facility] for tasks assessed/performed             Past Medical History:  Diagnosis Date   Allergic asthma    Barrett's esophagus    BPH (benign prostatic hyperplasia)    CAD (coronary artery disease)    CAD (coronary atherosclerotic disease)    Confusion    COPD (chronic obstructive pulmonary disease) (HCC)    Diabetes (HCC)    GERD (gastroesophageal reflux disease)    Heart attack (HCC)    x2   Hiatal hernia    Hyperlipidemia    Hypertension    Hypothyroidism    Irregular heart rhythm    Polypharmacy    Primary hypertension 08/14/2014   Psoriasis    Sleep apnea    wears CPAP nightly   Past Surgical History:  Procedure Laterality Date   CATARACT EXTRACTION Left 09/2021   COLONOSCOPY  03/2017   CORONARY ANGIOPLASTY WITH STENT PLACEMENT  08/15/2014   PCI of pLAD Xience alpine DES   EYE SURGERY  01/2017   eyelids lifted   LEFT HEART CATHETERIZATION WITH CORONARY ANGIOGRAM N/A 08/15/2014   Procedure: LEFT HEART CATHETERIZATION WITH CORONARY ANGIOGRAM;  Surgeon: Peter M Swaziland, MD;  Location: [facility] CATH LAB;  Service: Cardiovascular;  Laterality: N/A;   left knee arthroscopy Left 1994   NASAL POLYP EXCISION  1953   NASAL SINUS SURGERY     deviated septum   TONSILLECTOMY     TOTAL HIP ARTHROPLASTY Right 06/22/2017   Procedure: RIGHT TOTAL HIP ARTHROPLASTY ANTERIOR APPROACH;  Surgeon: Samson Frederic, MD;  Location: [facility] OR;  Service:  Orthopedics;  Laterality: Right;  Needs RNFA   TOTAL HIP REVISION Right 07/10/2017   Procedure: RIGHT TOTAL HIP REVISION FEMORAL COMPONENT;  Surgeon: Samson Frederic, MD;  Location: [facility] OR;  Service: Orthopedics;  Laterality: Right;  Needs RNFA   Patient Active Problem List   Diagnosis Date Noted   Pure hypercholesterolemia 07/03/2022   Erythrocytosis 05/28/2020   Macrocytosis without anemia 05/28/2020   DNR (do not resuscitate) 03/30/2019   Failed total hip arthroplasty, initial encounter (HCC) 07/10/2017   Primary osteoarthritis of right hip 06/22/2017   Osteoarthritis of right hip 06/22/2017   Weight gain 03/30/2015   Constipation 12/28/2014   Bilateral lower extremity edema 12/28/2014   RBBB 08/14/2014   Psoriasis 08/14/2014   Primary hypertension 08/14/2014   Hoarseness 06/15/2014   Rash and nonspecific skin eruption 06/15/2014   Submandibular swelling 05/15/2013   Asthma with acute exacerbation 08/18/2012   Eustachian tube dysfunction 04/05/2012   Allergic rhinitis due to pollen 06/28/2010   ABDOMINAL BRUIT 03/04/2010   Dyslipidemia 03/07/2009   Atherosclerosis of native coronary artery of native heart without angina pectoris 03/07/2009   BPH (benign prostatic hyperplasia) 03/07/2009   Hypothyroidism 03/06/2009   Allergic-infective asthma 04/06/2007   Centrilobular emphysema (HCC) 04/06/2007    PCP: Renne Crigler,  Zollie Beckers, MD  REFERRING PROVIDER: Horton Chin, MD  REFERRING DIAG: [id number] (ICD-10-CM) - Lumbar disc herniation  Rationale for Evaluation and Treatment: Rehabilitation  THERAPY DIAG:  Muscle weakness (generalized)  Unsteadiness on feet  Abnormal posture  Other low back pain  Acute pain of left shoulder  ONSET DATE: 09/01/2022 (referral)  SUBJECTIVE:                                                                                                                                                                                           SUBJECTIVE  STATEMENT: "When I walk, my back hurts". Pt reports he avoids things that cause pain. 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 Recently started having L shoulder pain that feels as though some one is "pushing deeply" into the front of my shoulder that "really hurts". States he intends on joining the [employer] to ride the bike and has been working on losing weight. States his back pain is mostly controlled right now due to him avoiding things that hurt his back. "Generally I have a good time".   PERTINENT HISTORY:  R THA in 2019   PAIN:  Are you having pain? Yes: NPRS scale: 3/10 Pain location: Low back Pain description: achy Aggravating factors: Standing for prolonged periods, walking, slouching Relieving factors: Chili peppers, tens unit  PRECAUTIONS: Fall  RED FLAGS: None   WEIGHT BEARING RESTRICTIONS: No  FALLS:  Has patient fallen in last 6 months? No  LIVING ENVIRONMENT: Lives with: lives with their spouse and lives with their son Lives in: Other townhouse Stairs: Yes: External: 2 steps; none Has following equipment at home: Single point cane, Environmental consultant - 2 wheeled, Wheelchair (manual), shower chair, and Grab bars  OCCUPATION: Preaches sometimes, retired Secretary/administrator   PLOF: Needs assistance with homemaking  PATIENT GOALS: "I want to strengthen my back so I can stand up longer"  NEXT MD VISIT: 9/13 w/Dr. Carlis Abbott  OBJECTIVE:   DIAGNOSTIC FINDINGS:  X-ray of Lumbar spine from 10/09/22  Narrative & Impression  CLINICAL DATA:  Pain.   EXAM: LUMBAR SPINE - 2 VIEW   COMPARISON:  10/02/2006   FINDINGS: Degenerative changes at each lumbar level with disc space narrowing and marginal osteophytes. Facet joint degenerative changes L3-S1. Grade 1 L5 retrolisthesis.   IMPRESSION: Degenerative changes.  No acute osseous abnormalities identified.    PATIENT SURVEYS:  Modified Oswestry 20/50 (moderate disability)   COGNITION: Overall cognitive  status: Within functional limits for tasks assessed     SENSATION: Denies numbness/tingling, but reports feeling a "cold wave" along his distal RLE. States he feels as though he has  a "thin coating" around bilateral feet, mild neuropathy.    POSTURE: rounded shoulders, forward head, increased thoracic kyphosis, and weight shift right    LOWER EXTREMITY MMT:  Tested in seated position   MMT Right eval Left eval  Hip flexion 4 4  Hip extension    Hip abduction 4 4  Hip adduction 5 5  Hip internal rotation    Hip external rotation    Knee flexion 5 5  Knee extension 4+ 4+  Ankle dorsiflexion 4+ 4+  Ankle plantarflexion    Ankle inversion    Ankle eversion     (Blank rows = not tested)  FUNCTIONAL TESTS:   [facility] PT Assessment - 11/12/22 1102       Transfers   Five time sit to stand comments  11.5s no UE support      Ambulation/Gait   Gait velocity 32.8' over 8.28s = 3.96 ft/s   no AD             GAIT: Distance walked: Various clinic distances  Assistive device utilized: None Level of assistance: Modified independence Comments: Pt demonstrates R foot slap and forward flexed posture, but no instability noted   TODAY'S TREATMENT:       Next Session                                                                                                                           PATIENT EDUCATION:  Education details: POC, aquatics handout, plan to obtain new referral from Dr. Carlis Abbott to address L shoulder pain  Person educated: Patient Education method: Explanation and Demonstration Education comprehension: verbalized understanding  HOME EXERCISE PROGRAM: To be established   Pt has silver sneakers - give information on aquatics for aquatics HEP  ASSESSMENT:  CLINICAL IMPRESSION: Patient is a 82 year old male referred to Neuro OPPT for lumbar disc herniation. Pt's PMH is significant for: CAD, COPD, MI, HTN, THA. The following deficits were present during the exam:  decreased activity tolerance, abnormal posture and decreased strength. Pt would benefit from skilled PT to address these impairments and functional limitations to maximize functional mobility independence.    OBJECTIVE IMPAIRMENTS: Abnormal gait, decreased endurance, decreased mobility, difficulty walking, improper body mechanics, postural dysfunction, and pain  ACTIVITY LIMITATIONS: carrying, lifting, and locomotion level  PARTICIPATION LIMITATIONS: meal prep, shopping, community activity, yard work, and church  PERSONAL FACTORS: Age, Fitness, Past/current experiences, and 1 comorbidity: lumbar disc herniation  are also affecting patient's functional outcome.   REHAB POTENTIAL: Good  CLINICAL DECISION MAKING: Stable/uncomplicated  EVALUATION COMPLEXITY: Low      GOALS: Goals reviewed with patient? Yes  SHORT TERM GOALS: Target date: 12/10/2022   Pt will be independent with initial HEP for improved strength, balance and activity tolerance  Baseline: not established on eval  Goal status: INITIAL    LONG TERM GOALS: Target date: 01/07/2023    Pt will be independent with final land and aquatics HEP for improved strength,  balance and activity tolerance  Baseline:  Goal status: INITIAL  2.  Pt will improve Modified Oswestry to </= 14 for reduced pain levels and improved QOL  Baseline: 20/50 Goal status: INITIAL   PLAN:  PT FREQUENCY: 1x/week  PT DURATION: 6 weeks  PLANNED INTERVENTIONS: Therapeutic exercises, Therapeutic activity, Neuromuscular re-education, Balance training, Gait training, Patient/Family education, Self Care, Joint mobilization, Vestibular training, Canalith repositioning, DME instructions, Aquatic Therapy, Dry Needling, Manual therapy, and Re-evaluation.  PLAN FOR NEXT SESSION: Land: maybe introduce TPDN? Work on spinal mobility, core stability, did we get referral back for L shoulder? If so, do recert and work on BUE strength/mobility. HEP for  weightbearing tolerance, core strength   Aquatics: pt has silver sneakers and likes to swim. Establish HEP and help pt find a pool to use    Saivon Prowse E Orvel Cutsforth, PT, DPT 11/12/2022, 11:19 AM

## 2022-11-12 NOTE — Telephone Encounter (Signed)
Dr. Carlis Abbott,   [name] was evaluated by PT today to address low back pain secondary to lumbar disc herniation. The pt would also benefit from a PT referral to address L shoulder pain. Do you mind sending an additional referral so we can treat his shoulder in clinic?   If you agree, please place an order in [facility] workque in [facility] or fax the order to [phone].  Thank you, Josephine Igo, PT, DPT [facility] [address] [place], [place]  [address] Phone:  [phone] Fax:  [phone]

## 2022-11-26 ENCOUNTER — Other Ambulatory Visit: Payer: Self-pay | Admitting: Physical Medicine and Rehabilitation

## 2022-11-26 DIAGNOSIS — E1342 Other specified diabetes mellitus with diabetic polyneuropathy: Secondary | ICD-10-CM

## 2022-11-28 ENCOUNTER — Ambulatory Visit: Payer: Medicare PPO | Attending: Physical Medicine and Rehabilitation | Admitting: Physical Therapy

## 2022-11-28 DIAGNOSIS — M6281 Muscle weakness (generalized): Secondary | ICD-10-CM | POA: Diagnosis not present

## 2022-11-28 DIAGNOSIS — M5459 Other low back pain: Secondary | ICD-10-CM | POA: Diagnosis not present

## 2022-11-28 DIAGNOSIS — R2681 Unsteadiness on feet: Secondary | ICD-10-CM | POA: Insufficient documentation

## 2022-11-28 DIAGNOSIS — M25512 Pain in left shoulder: Secondary | ICD-10-CM | POA: Insufficient documentation

## 2022-11-28 DIAGNOSIS — R293 Abnormal posture: Secondary | ICD-10-CM | POA: Insufficient documentation

## 2022-11-28 NOTE — Therapy (Signed)
OUTPATIENT PHYSICAL THERAPY THORACOLUMBAR TREATMENT - RECERTIFICATION   Patient Name: [name] MRN: [id number] DOB:[birth year], 82 y.o., male Today's Date: 11/28/2022  END OF SESSION:  PT End of Session - 11/28/22 1106     Visit Number 2    Number of Visits 7   Plus eval   Date for PT Re-Evaluation 01/07/23   Due to potential delay in scheduling   Authorization Type [employer]    PT Start Time 1105    PT Stop Time 1150    PT Time Calculation (min) 45 min    Activity Tolerance Patient tolerated treatment well    Behavior During Therapy [facility] for tasks assessed/performed              Past Medical History:  Diagnosis Date   Allergic asthma    Barrett's esophagus    BPH (benign prostatic hyperplasia)    CAD (coronary artery disease)    CAD (coronary atherosclerotic disease)    Confusion    COPD (chronic obstructive pulmonary disease) (HCC)    Diabetes (HCC)    GERD (gastroesophageal reflux disease)    Heart attack (HCC)    x2   Hiatal hernia    Hyperlipidemia    Hypertension    Hypothyroidism    Irregular heart rhythm    Polypharmacy    Primary hypertension 08/14/2014   Psoriasis    Sleep apnea    wears CPAP nightly   Past Surgical History:  Procedure Laterality Date   CATARACT EXTRACTION Left 09/2021   COLONOSCOPY  03/2017   CORONARY ANGIOPLASTY WITH STENT PLACEMENT  08/15/2014   PCI of pLAD Xience alpine DES   EYE SURGERY  01/2017   eyelids lifted   LEFT HEART CATHETERIZATION WITH CORONARY ANGIOGRAM N/A 08/15/2014   Procedure: LEFT HEART CATHETERIZATION WITH CORONARY ANGIOGRAM;  Surgeon: Peter M Swaziland, MD;  Location: [facility] CATH LAB;  Service: Cardiovascular;  Laterality: N/A;   left knee arthroscopy Left 1994   NASAL POLYP EXCISION  1953   NASAL SINUS SURGERY     deviated septum   TONSILLECTOMY     TOTAL HIP ARTHROPLASTY Right 06/22/2017   Procedure: RIGHT TOTAL HIP ARTHROPLASTY ANTERIOR APPROACH;  Surgeon: Samson Frederic, MD;  Location: [facility] OR;   Service: Orthopedics;  Laterality: Right;  Needs RNFA   TOTAL HIP REVISION Right 07/10/2017   Procedure: RIGHT TOTAL HIP REVISION FEMORAL COMPONENT;  Surgeon: Samson Frederic, MD;  Location: [facility] OR;  Service: Orthopedics;  Laterality: Right;  Needs RNFA   Patient Active Problem List   Diagnosis Date Noted   Pure hypercholesterolemia 07/03/2022   Erythrocytosis 05/28/2020   Macrocytosis without anemia 05/28/2020   DNR (do not resuscitate) 03/30/2019   Failed total hip arthroplasty, initial encounter (HCC) 07/10/2017   Primary osteoarthritis of right hip 06/22/2017   Osteoarthritis of right hip 06/22/2017   Weight gain 03/30/2015   Constipation 12/28/2014   Bilateral lower extremity edema 12/28/2014   RBBB 08/14/2014   Psoriasis 08/14/2014   Primary hypertension 08/14/2014   Hoarseness 06/15/2014   Rash and nonspecific skin eruption 06/15/2014   Submandibular swelling 05/15/2013   Asthma with acute exacerbation 08/18/2012   Eustachian tube dysfunction 04/05/2012   Allergic rhinitis due to pollen 06/28/2010   ABDOMINAL BRUIT 03/04/2010   Dyslipidemia 03/07/2009   Atherosclerosis of native coronary artery of native heart without angina pectoris 03/07/2009   BPH (benign prostatic hyperplasia) 03/07/2009   Hypothyroidism 03/06/2009   Allergic-infective asthma 04/06/2007   Centrilobular emphysema (HCC) 04/06/2007  PCP: Merri Brunette, MD  REFERRING PROVIDER: Horton Chin, MD  REFERRING DIAG: M51.26 (ICD-10-CM) - Lumbar disc herniation; M25.512 (ICD-10-CM) - Acute pain of left shoulder   Rationale for Evaluation and Treatment: Rehabilitation  THERAPY DIAG:  Muscle weakness (generalized)  Unsteadiness on feet  Abnormal posture  Other low back pain  Acute pain of left shoulder  ONSET DATE: 09/01/2022 (referral)  SUBJECTIVE:                                                                                                                                                                                            SUBJECTIVE STATEMENT: "[name]"  Pt reports 2/10 low back pain today at rest, was 8/10 when carrying garbage earlier today. Pt reports that he has no shoulder pain at rest, feels like a gunshot when he gets the pain but nothing specific causes it, gets it 3-4 times per day unexpectedly.  PERTINENT HISTORY:  R THA in 2019   PAIN:  Are you having pain? Yes: NPRS scale: 2/10 Pain location: Low back Pain description: achy Aggravating factors: Standing for prolonged periods, walking, slouching Relieving factors: Chili peppers, tens unit  PRECAUTIONS: Fall  RED FLAGS: None   WEIGHT BEARING RESTRICTIONS: No  FALLS:  Has patient fallen in last 6 months? No  LIVING ENVIRONMENT: Lives with: lives with their spouse and lives with their son Lives in: Other townhouse Stairs: Yes: External: 2 steps; none Has following equipment at home: Single point cane, Environmental consultant - 2 wheeled, Wheelchair (manual), shower chair, and Grab bars  OCCUPATION: Preaches sometimes, retired Secretary/administrator   PLOF: Needs assistance with homemaking  PATIENT GOALS: "I want to strengthen my back so I can stand up longer"  NEXT MD VISIT: 9/13 w/Dr. Carlis Abbott  OBJECTIVE:   DIAGNOSTIC FINDINGS:  X-ray of Lumbar spine from 10/09/22  Narrative & Impression  CLINICAL DATA:  Pain.   EXAM: LUMBAR SPINE - 2 VIEW   COMPARISON:  10/02/2006   FINDINGS: Degenerative changes at each lumbar level with disc space narrowing and marginal osteophytes. Facet joint degenerative changes L3-S1. Grade 1 L5 retrolisthesis.   IMPRESSION: Degenerative changes.  No acute osseous abnormalities identified.    PATIENT SURVEYS:  Modified Oswestry 20/50 (moderate disability)   COGNITION: Overall cognitive status: Within functional limits for tasks assessed     SENSATION: Denies numbness/tingling, but reports feeling a "cold wave" along his distal RLE. States he feels as though he has a  "thin coating" around bilateral feet, mild neuropathy.    POSTURE: rounded shoulders, forward head, increased thoracic kyphosis, and weight shift right    LOWER EXTREMITY MMT:  Tested  in seated position   MMT Right eval Left eval  Hip flexion 4 4  Hip extension    Hip abduction 4 4  Hip adduction 5 5  Hip internal rotation    Hip external rotation    Knee flexion 5 5  Knee extension 4+ 4+  Ankle dorsiflexion 4+ 4+  Ankle plantarflexion    Ankle inversion    Ankle eversion     (Blank rows = not tested)   Assessed on 11/28/22: UPPER EXTREMITY ROM:  Active ROM Right eval Left eval  Shoulder flexion  Limited to about 100 degrees (painful)  Shoulder extension    Shoulder abduction  Limited to about 100 degrees (painful)  Shoulder adduction    Shoulder extension  Decreased by about 10 degrees  Shoulder internal rotation  [facility]  Shoulder external rotation    Elbow flexion  WFL  Elbow extension  WFL  Wrist flexion    Wrist extension    Wrist ulnar deviation    Wrist radial deviation    Wrist pronation    Wrist supination     (Blank rows = not tested)     UPPER EXTREMITY MMT:  MMT Right eval Left eval  Shoulder flexion  4  Shoulder extension    Shoulder abduction  4  Shoulder adduction    Shoulder extension    Shoulder internal rotation  5  Shoulder external rotation  4 (pain in anterior shoulder)  Middle trapezius    Lower trapezius    Elbow flexion  5  Elbow extension  5  Wrist flexion    Wrist extension    Wrist ulnar deviation    Wrist radial deviation    Wrist pronation    Wrist supination    Grip strength     (Blank rows = not tested)   Pt with tenderness to palpation across L AC joint, does have a history of fracturing his clavicle around age 88.   TODAY'S TREATMENT:       TherEx Supine posterior pelvic tilt x 10 reps with 5 sec hold Supine bridge with TA contract x 10 reps with 5 sec hold Supine marches with TA contract x 10 reps B Supine  L shoulder IR/ER gentle AROM x 10 reps B  Added to HEP, see bolded below   TherAct Assessed L shoulder joint, see above                                                                                                                          PATIENT EDUCATION:  Education details: results of assessment of L shoulder, initiated HEP Person educated: Patient Education method: Explanation, Demonstration, and Handouts Education comprehension: verbalized understanding, returned demonstration, and needs further education  HOME EXERCISE PROGRAM: Pt has silver sneakers - give information on aquatics for aquatics HEP  Access Code: [email or web address] URL: [email or web address] Date: 11/28/2022 Prepared by: Peter Congo  Exercises - Supine Posterior Pelvic Tilt  - 1 x daily - 7 x  weekly - 3 sets - 10 reps - Supine Bridge  - 1 x daily - 7 x weekly - 3 sets - 10 reps - 5 sec hold - Supine March with Posterior Pelvic Tilt  - 1 x daily - 7 x weekly - 3 sets - 10 reps - Supine Shoulder Internal Rotation Stretch  - 1 x daily - 7 x weekly - 1 sets - 10 reps - Supine Shoulder External Rotation Stretch  - 1 x daily - 7 x weekly - 1 sets - 10 reps - Seated Scapular Retraction  - 1 x daily - 7 x weekly - 1 sets - 10 reps  ASSESSMENT:  CLINICAL IMPRESSION: Emphasis of skilled PT session on assessing patient's L shoulder due to additional referral to address his L shoulder pain as well as his low back pain. Pt found to have decreased ROM of his L shoulder joint as well as decreased strength in this joint, tenderness to palpation across [facility] joint. Pt likely experiencing some pain in this joint due to muscle imbalances as a result of his history of injury to his L clavicle in his youth. Pt can benefit from addition of L shoulder stretching and strengthening exercises to his therapy POC as well as continued skilled therapy services to address his low back pain. Initiated HEP this session with focus on  performing pelvic tilts correctly with breathing. Continue POC.    OBJECTIVE IMPAIRMENTS: Abnormal gait, decreased endurance, decreased mobility, difficulty walking, improper body mechanics, postural dysfunction, and pain  ACTIVITY LIMITATIONS: carrying, lifting, and locomotion level  PARTICIPATION LIMITATIONS: meal prep, shopping, community activity, yard work, and church  PERSONAL FACTORS: Age, Fitness, Past/current experiences, and 1 comorbidity: lumbar disc herniation  are also affecting patient's functional outcome.   REHAB POTENTIAL: Good  CLINICAL DECISION MAKING: Stable/uncomplicated  EVALUATION COMPLEXITY: Low      GOALS: Goals reviewed with patient? Yes  SHORT TERM GOALS: Target date: 12/10/2022   Pt will be independent with initial HEP for improved strength, balance and activity tolerance  Baseline: not established on eval  Goal status: INITIAL    LONG TERM GOALS: Target date: 01/07/2023    Pt will be independent with final land and aquatics HEP for improved strength, balance and activity tolerance  Baseline:  Goal status: INITIAL  2.  Pt will improve Modified Oswestry to </= 14 for reduced pain levels and improved QOL  Baseline: 20/50 Goal status: INITIAL   PLAN:  PT FREQUENCY: 1x/week  PT DURATION: 6 weeks  PLANNED INTERVENTIONS: Therapeutic exercises, Therapeutic activity, Neuromuscular re-education, Balance training, Gait training, Patient/Family education, Self Care, Joint mobilization, Vestibular training, Canalith repositioning, DME instructions, Aquatic Therapy, Dry Needling, Manual therapy, and Re-evaluation.  PLAN FOR NEXT SESSION: Land: how is initial HEP? maybe introduce TPDN? Work on spinal mobility, core stability, HEP for weightbearing tolerance, core strength, L shoulder ROM and strengthening, postural stabilization  Aquatics: pt has silver sneakers and likes to swim. Establish HEP and help pt find a pool to use    Peter Congo,  PT, DPT, CSRS  11/28/2022, 11:53 AM

## 2022-12-04 ENCOUNTER — Ambulatory Visit: Payer: Medicare PPO | Admitting: Physical Therapy

## 2022-12-05 DIAGNOSIS — G4733 Obstructive sleep apnea (adult) (pediatric): Secondary | ICD-10-CM | POA: Diagnosis not present

## 2022-12-11 ENCOUNTER — Ambulatory Visit: Payer: Medicare PPO | Admitting: Physical Therapy

## 2022-12-11 ENCOUNTER — Encounter: Payer: Self-pay | Admitting: Physical Therapy

## 2022-12-11 DIAGNOSIS — M5459 Other low back pain: Secondary | ICD-10-CM

## 2022-12-11 DIAGNOSIS — M6281 Muscle weakness (generalized): Secondary | ICD-10-CM | POA: Diagnosis not present

## 2022-12-11 DIAGNOSIS — R293 Abnormal posture: Secondary | ICD-10-CM | POA: Diagnosis not present

## 2022-12-11 DIAGNOSIS — R2681 Unsteadiness on feet: Secondary | ICD-10-CM

## 2022-12-11 DIAGNOSIS — M5126 Other intervertebral disc displacement, lumbar region: Secondary | ICD-10-CM | POA: Diagnosis not present

## 2022-12-11 DIAGNOSIS — G894 Chronic pain syndrome: Secondary | ICD-10-CM | POA: Diagnosis not present

## 2022-12-11 DIAGNOSIS — M25512 Pain in left shoulder: Secondary | ICD-10-CM

## 2022-12-11 NOTE — Therapy (Signed)
OUTPATIENT PHYSICAL THERAPY THORACOLUMBAR TREATMENT - RECERTIFICATION   Patient Name: [name] MRN: [id number] DOB:1940/07/25, 82 y.o., male Today's Date: 12/11/2022  END OF SESSION:  PT End of Session - 12/11/22 1146     Visit Number 3    Number of Visits 7    Date for PT Re-Evaluation 01/07/23    Authorization Type [employer]    PT Start Time 1145    PT Stop Time 1230    PT Time Calculation (min) 45 min    Activity Tolerance Patient tolerated treatment well    Behavior During Therapy WFL for tasks assessed/performed               Past Medical History:  Diagnosis Date   Allergic asthma    Barrett's esophagus    BPH (benign prostatic hyperplasia)    CAD (coronary artery disease)    CAD (coronary atherosclerotic disease)    Confusion    COPD (chronic obstructive pulmonary disease) (HCC)    Diabetes (HCC)    GERD (gastroesophageal reflux disease)    Heart attack (HCC)    x2   Hiatal hernia    Hyperlipidemia    Hypertension    Hypothyroidism    Irregular heart rhythm    Polypharmacy    Primary hypertension 08/14/2014   Psoriasis    Sleep apnea    wears CPAP nightly   Past Surgical History:  Procedure Laterality Date   CATARACT EXTRACTION Left 09/2021   COLONOSCOPY  03/2017   CORONARY ANGIOPLASTY WITH STENT PLACEMENT  08/15/2014   PCI of pLAD Xience alpine DES   EYE SURGERY  01/2017   eyelids lifted   LEFT HEART CATHETERIZATION WITH CORONARY ANGIOGRAM N/A 08/15/2014   Procedure: LEFT HEART CATHETERIZATION WITH CORONARY ANGIOGRAM;  Surgeon: Peter M Swaziland, MD;  Location: [facility] CATH LAB;  Service: Cardiovascular;  Laterality: N/A;   left knee arthroscopy Left 1994   NASAL POLYP EXCISION  1953   NASAL SINUS SURGERY     deviated septum   TONSILLECTOMY     TOTAL HIP ARTHROPLASTY Right 06/22/2017   Procedure: RIGHT TOTAL HIP ARTHROPLASTY ANTERIOR APPROACH;  Surgeon: Samson Frederic, MD;  Location: [facility] OR;  Service: Orthopedics;  Laterality: Right;   Needs RNFA   TOTAL HIP REVISION Right 07/10/2017   Procedure: RIGHT TOTAL HIP REVISION FEMORAL COMPONENT;  Surgeon: Samson Frederic, MD;  Location: [facility] OR;  Service: Orthopedics;  Laterality: Right;  Needs RNFA   Patient Active Problem List   Diagnosis Date Noted   Pure hypercholesterolemia 07/03/2022   Erythrocytosis 05/28/2020   Macrocytosis without anemia 05/28/2020   DNR (do not resuscitate) 03/30/2019   Failed total hip arthroplasty, initial encounter (HCC) 07/10/2017   Primary osteoarthritis of right hip 06/22/2017   Osteoarthritis of right hip 06/22/2017   Weight gain 03/30/2015   Constipation 12/28/2014   Bilateral lower extremity edema 12/28/2014   RBBB 08/14/2014   Psoriasis 08/14/2014   Primary hypertension 08/14/2014   Hoarseness 06/15/2014   Rash and nonspecific skin eruption 06/15/2014   Submandibular swelling 05/15/2013   Asthma with acute exacerbation 08/18/2012   Eustachian tube dysfunction 04/05/2012   Allergic rhinitis due to pollen 06/28/2010   ABDOMINAL BRUIT 03/04/2010   Dyslipidemia 03/07/2009   Atherosclerosis of native coronary artery of native heart without angina pectoris 03/07/2009   BPH (benign prostatic hyperplasia) 03/07/2009   Hypothyroidism 03/06/2009   Allergic-infective asthma 04/06/2007   Centrilobular emphysema (HCC) 04/06/2007    PCP: Merri Brunette, MD  REFERRING PROVIDER: Carlis Abbott,  Drema Pry, MD  REFERRING DIAG: M51.26 (ICD-10-CM) - Lumbar disc herniation; M25.512 (ICD-10-CM) - Acute pain of left shoulder   Rationale for Evaluation and Treatment: Rehabilitation  THERAPY DIAG:  Muscle weakness (generalized)  Unsteadiness on feet  Abnormal posture  Other low back pain  Acute pain of left shoulder  ONSET DATE: 09/01/2022 (referral)  SUBJECTIVE:                                                                                                                                                                                            SUBJECTIVE STATEMENT: Reports trying to figure out the best place to perform exercises at home bed is soft but has a firmer sofa he can try.  PERTINENT HISTORY:  R THA in 2019   PAIN:  Are you having pain? Yes: NPRS scale: 2/10 Pain location: neck Pain description: achy Aggravating factors: Standing for prolonged periods, walking, slouching Relieving factors: Chili peppers, tens unit  PRECAUTIONS: Fall  RED FLAGS: None   WEIGHT BEARING RESTRICTIONS: No  FALLS:  Has patient fallen in last 6 months? No  LIVING ENVIRONMENT: Lives with: lives with their spouse and lives with their son Lives in: Other townhouse Stairs: Yes: External: 2 steps; none Has following equipment at home: Single point cane, Environmental consultant - 2 wheeled, Wheelchair (manual), shower chair, and Grab bars  OCCUPATION: Preaches sometimes, retired Secretary/administrator   PLOF: Needs assistance with homemaking  PATIENT GOALS: "I want to strengthen my back so I can stand up longer"  NEXT MD VISIT: 9/13 w/Dr. Carlis Abbott  OBJECTIVE:   DIAGNOSTIC FINDINGS:  X-ray of Lumbar spine from 10/09/22  Narrative & Impression  CLINICAL DATA:  Pain.   EXAM: LUMBAR SPINE - 2 VIEW   COMPARISON:  10/02/2006   FINDINGS: Degenerative changes at each lumbar level with disc space narrowing and marginal osteophytes. Facet joint degenerative changes L3-S1. Grade 1 L5 retrolisthesis.   IMPRESSION: Degenerative changes.  No acute osseous abnormalities identified.    PATIENT SURVEYS:  Modified Oswestry 20/50 (moderate disability)   COGNITION: Overall cognitive status: Within functional limits for tasks assessed     SENSATION: Denies numbness/tingling, but reports feeling a "cold wave" along his distal RLE. States he feels as though he has a "thin coating" around bilateral feet, mild neuropathy.    POSTURE: rounded shoulders, forward head, increased thoracic kyphosis, and weight shift right    LOWER EXTREMITY MMT:  Tested in  seated position   MMT Right eval Left eval  Hip flexion 4 4  Hip extension    Hip abduction 4 4  Hip adduction 5 5  Hip internal rotation  Hip external rotation    Knee flexion 5 5  Knee extension 4+ 4+  Ankle dorsiflexion 4+ 4+  Ankle plantarflexion    Ankle inversion    Ankle eversion     (Blank rows = not tested)   Assessed on 11/28/22: UPPER EXTREMITY ROM:  Active ROM Right eval Left eval  Shoulder flexion  Limited to about 100 degrees (painful)  Shoulder extension    Shoulder abduction  Limited to about 100 degrees (painful)  Shoulder adduction    Shoulder extension  Decreased by about 10 degrees  Shoulder internal rotation  [facility]  Shoulder external rotation    Elbow flexion  WFL  Elbow extension  WFL  Wrist flexion    Wrist extension    Wrist ulnar deviation    Wrist radial deviation    Wrist pronation    Wrist supination     (Blank rows = not tested)     UPPER EXTREMITY MMT:  MMT Right eval Left eval  Shoulder flexion  4  Shoulder extension    Shoulder abduction  4  Shoulder adduction    Shoulder extension    Shoulder internal rotation  5  Shoulder external rotation  4 (pain in anterior shoulder)  Middle trapezius    Lower trapezius    Elbow flexion  5  Elbow extension  5  Wrist flexion    Wrist extension    Wrist ulnar deviation    Wrist radial deviation    Wrist pronation    Wrist supination    Grip strength     (Blank rows = not tested)   Pt with tenderness to palpation across L AC joint, does have a history of fracturing his clavicle around age 46.   TODAY'S TREATMENT:     12/11/22  TherEx Supine posterior pelvic tilt x 10 reps with 5 sec hold Supine bridge with TA contract x 10 reps with 5 sec hold Supine marches with TA contract x 10 reps B Supine L shoulder IR/ER gentle AROM x 10 reps B  Cues for technique for each exercise and how to plan to perform with home setup.                                                                                                                              PATIENT EDUCATION:  Education details: Review HEP Person educated: Patient Education method: Programmer, multimedia, Facilities manager, and Handouts Education comprehension: verbalized understanding, returned demonstration, and needs further education  HOME EXERCISE PROGRAM: Pt has silver sneakers - give information on aquatics for aquatics HEP  Access Code: [email or web address] URL: [email or web address] Date: 11/28/2022 Prepared by: Peter Congo  Exercises - Supine Posterior Pelvic Tilt  - 1 x daily - 7 x weekly - 3 sets - 10 reps - Supine Bridge  - 1 x daily - 7 x weekly - 3 sets - 10 reps - 5 sec hold - Supine March with Posterior Pelvic Tilt  - 1 x  daily - 7 x weekly - 3 sets - 10 reps - Supine Shoulder Internal Rotation Stretch  - 1 x daily - 7 x weekly - 1 sets - 10 reps - Supine Shoulder External Rotation Stretch  - 1 x daily - 7 x weekly - 1 sets - 10 reps - Seated Scapular Retraction  - 1 x daily - 7 x weekly - 1 sets - 10 reps  ASSESSMENT:  CLINICAL IMPRESSION: Emphasis of skilled PT session on reviewing current HEP, helping pt gain confidence on exercise technique and how to plan for successful compliance of HEP at home. Continue POC.    OBJECTIVE IMPAIRMENTS: Abnormal gait, decreased endurance, decreased mobility, difficulty walking, improper body mechanics, postural dysfunction, and pain  ACTIVITY LIMITATIONS: carrying, lifting, and locomotion level  PARTICIPATION LIMITATIONS: meal prep, shopping, community activity, yard work, and church  PERSONAL FACTORS: Age, Fitness, Past/current experiences, and 1 comorbidity: lumbar disc herniation  are also affecting patient's functional outcome.   REHAB POTENTIAL: Good  CLINICAL DECISION MAKING: Stable/uncomplicated  EVALUATION COMPLEXITY: Low      GOALS: Goals reviewed with patient? Yes  SHORT TERM GOALS: Target date: 12/10/2022   Pt will be independent  with initial HEP for improved strength, balance and activity tolerance  Baseline: not established on eval  Goal status: INITIAL    LONG TERM GOALS: Target date: 01/07/2023    Pt will be independent with final land and aquatics HEP for improved strength, balance and activity tolerance  Baseline:  Goal status: INITIAL  2.  Pt will improve Modified Oswestry to </= 14 for reduced pain levels and improved QOL  Baseline: 20/50 Goal status: INITIAL   PLAN:  PT FREQUENCY: 1x/week  PT DURATION: 6 weeks  PLANNED INTERVENTIONS: Therapeutic exercises, Therapeutic activity, Neuromuscular re-education, Balance training, Gait training, Patient/Family education, Self Care, Joint mobilization, Vestibular training, Canalith repositioning, DME instructions, Aquatic Therapy, Dry Needling, Manual therapy, and Re-evaluation.  PLAN FOR NEXT SESSION: Land: how is initial HEP? maybe introduce TPDN? Work on spinal mobility, core stability, HEP for weightbearing tolerance, core strength, L shoulder ROM and strengthening, postural stabilization  Aquatics: pt has silver sneakers and likes to swim. Establish HEP and help pt find a pool to use    Hortencia Conradi, PTA  12/11/22, 12:50 PM

## 2022-12-18 ENCOUNTER — Ambulatory Visit: Payer: Medicare PPO | Admitting: Physical Therapy

## 2022-12-18 ENCOUNTER — Encounter: Payer: Self-pay | Admitting: Physical Therapy

## 2022-12-18 DIAGNOSIS — M5459 Other low back pain: Secondary | ICD-10-CM | POA: Diagnosis not present

## 2022-12-18 DIAGNOSIS — M25512 Pain in left shoulder: Secondary | ICD-10-CM

## 2022-12-18 DIAGNOSIS — R293 Abnormal posture: Secondary | ICD-10-CM

## 2022-12-18 DIAGNOSIS — R2681 Unsteadiness on feet: Secondary | ICD-10-CM | POA: Diagnosis not present

## 2022-12-18 DIAGNOSIS — M6281 Muscle weakness (generalized): Secondary | ICD-10-CM

## 2022-12-18 NOTE — Therapy (Signed)
OUTPATIENT PHYSICAL THERAPY THORACOLUMBAR TREATMENT    Patient Name: [name] MRN: [id number] DOB:17-May-1940, 82 y.o., male Today's Date: 12/18/2022  END OF SESSION:  PT End of Session - 12/18/22 0940     Visit Number 4    Number of Visits 7    Date for PT Re-Evaluation 01/07/23    Authorization Type [employer]    PT Start Time 0845    PT Stop Time 0930    PT Time Calculation (min) 45 min    Activity Tolerance Patient tolerated treatment well    Behavior During Therapy WFL for tasks assessed/performed               Past Medical History:  Diagnosis Date   Allergic asthma    Barrett's esophagus    BPH (benign prostatic hyperplasia)    CAD (coronary artery disease)    CAD (coronary atherosclerotic disease)    Confusion    COPD (chronic obstructive pulmonary disease) (HCC)    Diabetes (HCC)    GERD (gastroesophageal reflux disease)    Heart attack (HCC)    x2   Hiatal hernia    Hyperlipidemia    Hypertension    Hypothyroidism    Irregular heart rhythm    Polypharmacy    Primary hypertension 08/14/2014   Psoriasis    Sleep apnea    wears CPAP nightly   Past Surgical History:  Procedure Laterality Date   CATARACT EXTRACTION Left 09/2021   COLONOSCOPY  03/2017   CORONARY ANGIOPLASTY WITH STENT PLACEMENT  08/15/2014   PCI of pLAD [employer] DES   EYE SURGERY  01/2017   eyelids lifted   LEFT HEART CATHETERIZATION WITH CORONARY ANGIOGRAM N/A 08/15/2014   Procedure: LEFT HEART CATHETERIZATION WITH CORONARY ANGIOGRAM;  Surgeon: Peter M [place], MD;  Location: [facility] CATH LAB;  Service: Cardiovascular;  Laterality: N/A;   left knee arthroscopy Left 1994   NASAL POLYP EXCISION  1953   NASAL SINUS SURGERY     deviated septum   TONSILLECTOMY     TOTAL HIP ARTHROPLASTY Right 06/22/2017   Procedure: RIGHT TOTAL HIP ARTHROPLASTY ANTERIOR APPROACH;  Surgeon: Samson Frederic, MD;  Location: [facility] OR;  Service: Orthopedics;  Laterality: Right;  Needs RNFA   TOTAL  HIP REVISION Right 07/10/2017   Procedure: RIGHT TOTAL HIP REVISION FEMORAL COMPONENT;  Surgeon: Samson Frederic, MD;  Location: [facility] OR;  Service: Orthopedics;  Laterality: Right;  Needs RNFA   Patient Active Problem List   Diagnosis Date Noted   Pure hypercholesterolemia 07/03/2022   Erythrocytosis 05/28/2020   Macrocytosis without anemia 05/28/2020   DNR (do not resuscitate) 03/30/2019   Failed total hip arthroplasty, initial encounter (HCC) 07/10/2017   Primary osteoarthritis of right hip 06/22/2017   Osteoarthritis of right hip 06/22/2017   Weight gain 03/30/2015   Constipation 12/28/2014   Bilateral lower extremity edema 12/28/2014   RBBB 08/14/2014   Psoriasis 08/14/2014   Primary hypertension 08/14/2014   Hoarseness 06/15/2014   Rash and nonspecific skin eruption 06/15/2014   Submandibular swelling 05/15/2013   Asthma with acute exacerbation 08/18/2012   Eustachian tube dysfunction 04/05/2012   Allergic rhinitis due to pollen 06/28/2010   ABDOMINAL BRUIT 03/04/2010   Dyslipidemia 03/07/2009   Atherosclerosis of native coronary artery of native heart without angina pectoris 03/07/2009   BPH (benign prostatic hyperplasia) 03/07/2009   Hypothyroidism 03/06/2009   Allergic-infective asthma 04/06/2007   Centrilobular emphysema (HCC) 04/06/2007    PCP: Merri Brunette, MD  REFERRING PROVIDER: Sula Soda  P, MD  REFERRING DIAG: M51.26 (ICD-10-CM) - Lumbar disc herniation; M25.512 (ICD-10-CM) - Acute pain of left shoulder   Rationale for Evaluation and Treatment: Rehabilitation  THERAPY DIAG:  Muscle weakness (generalized)  Unsteadiness on feet  Abnormal posture  Other low back pain  Acute pain of left shoulder  ONSET DATE: 09/01/2022 (referral)  SUBJECTIVE:                                                                                                                                                                                           SUBJECTIVE  STATEMENT: Patient states he is excited for session and plans to go to a pool to continue his exercises.  PERTINENT HISTORY:  R THA in 2019   PAIN:  Are you having pain? No  PRECAUTIONS: Fall  RED FLAGS: None   WEIGHT BEARING RESTRICTIONS: No  FALLS:  Has patient fallen in last 6 months? No  LIVING ENVIRONMENT: Lives with: lives with their spouse and lives with their son Lives in: Other townhouse Stairs: Yes: External: 2 steps; none Has following equipment at home: Single point cane, Environmental consultant - 2 wheeled, Wheelchair (manual), shower chair, and Grab bars  OCCUPATION: Preaches sometimes, retired Secretary/administrator   PLOF: Needs assistance with homemaking  PATIENT GOALS: "I want to strengthen my back so I can stand up longer"  NEXT MD VISIT: 9/13 w/Dr. Carlis Abbott  OBJECTIVE:   DIAGNOSTIC FINDINGS:  X-ray of Lumbar spine from 10/09/22  Narrative & Impression  CLINICAL DATA:  Pain.   EXAM: LUMBAR SPINE - 2 VIEW   COMPARISON:  10/02/2006   FINDINGS: Degenerative changes at each lumbar level with disc space narrowing and marginal osteophytes. Facet joint degenerative changes L3-S1. Grade 1 L5 retrolisthesis.   IMPRESSION: Degenerative changes.  No acute osseous abnormalities identified.    PATIENT SURVEYS:  Modified Oswestry 20/50 (moderate disability)   COGNITION: Overall cognitive status: Within functional limits for tasks assessed     SENSATION: Denies numbness/tingling, but reports feeling a "cold wave" along his distal RLE. States he feels as though he has a "thin coating" around bilateral feet, mild neuropathy.    POSTURE: rounded shoulders, forward head, increased thoracic kyphosis, and weight shift right    LOWER EXTREMITY MMT:  Tested in seated position   MMT Right eval Left eval  Hip flexion 4 4  Hip extension    Hip abduction 4 4  Hip adduction 5 5  Hip internal rotation    Hip external rotation    Knee flexion 5 5  Knee extension 4+ 4+   Ankle dorsiflexion 4+ 4+  Ankle plantarflexion    Ankle  inversion    Ankle eversion     (Blank rows = not tested)   Assessed on 11/28/22: UPPER EXTREMITY ROM:  Active ROM Right eval Left eval  Shoulder flexion  Limited to about 100 degrees (painful)  Shoulder extension    Shoulder abduction  Limited to about 100 degrees (painful)  Shoulder adduction    Shoulder extension  Decreased by about 10 degrees  Shoulder internal rotation  [facility]  Shoulder external rotation    Elbow flexion  WFL  Elbow extension  WFL  Wrist flexion    Wrist extension    Wrist ulnar deviation    Wrist radial deviation    Wrist pronation    Wrist supination     (Blank rows = not tested)     UPPER EXTREMITY MMT:  MMT Right eval Left eval  Shoulder flexion  4  Shoulder extension    Shoulder abduction  4  Shoulder adduction    Shoulder extension    Shoulder internal rotation  5  Shoulder external rotation  4 (pain in anterior shoulder)  Middle trapezius    Lower trapezius    Elbow flexion  5  Elbow extension  5  Wrist flexion    Wrist extension    Wrist ulnar deviation    Wrist radial deviation    Wrist pronation    Wrist supination    Grip strength     (Blank rows = not tested)   Pt with tenderness to palpation across L AC joint, does have a history of fracturing his clavicle around age 39.   TODAY'S TREATMENT:     12/18/22  Aquatic therapy at [facility] - pool temperature 92 degrees   Patient seen for aquatic therapy today.  Treatment took place in water 3.6-4.8 feet deep depending upon activity.  Patient entered and exited the pool via stairs using rail and reciprocal steps at SBA level.   Exercises: Warmup: Unsupported water walking forwards, backwards, and laterally 4 x 18 feet each direction SBA  -Standing hamstring stretch on step 2x1 minute each LE -Standing low back stretch 2x45 seconds (modified using second step as pt tolerated positioning better) -Seated at bench  performing L piriformis stretch x45 seconds -Seated on bench SKTC stretch 3x30 seconds each  For core engagement: -forward and lateral raises 3x10 each direction w/ large aquatic dumbbells -Arm flutters in standing w/ large aquatic dumbbells -Seated on bench performing flutter kicks 2 x 1 minute -STS 2x15 -Pt requests and completes backstroke 2x50 feet w/ supervision - discussed caution due to shallow water and pool landmarks, PT provided guidance to prevent incident  Patient requires buoyancy of the water for support for reduced fall risk with gait training and balance exercises with SBA-CGA support. Exercises able to be performed safely in water without the risk of fall compared to those same exercises performed on land; viscosity of water needed for resistance for strengthening. Current of water provides perturbations for challenging static and dynamic balance.   PATIENT EDUCATION:  Education details: Aquatic rationale and establishing a pool HEP and obtaining copy of community pools for patient. Person educated: Patient Education method: Explanation, Demonstration, and Handouts Education comprehension: verbalized understanding, returned demonstration, and needs further education  HOME EXERCISE PROGRAM: Pt has silver sneakers - give information on aquatics for aquatics HEP  Access Code: [email or web address] URL: [email or web address] Date: 11/28/2022 Prepared by: Peter Congo  Exercises - Supine Posterior Pelvic Tilt  - 1 x daily - 7 x weekly - 3 sets - 10 reps -  Supine Bridge  - 1 x daily - 7 x weekly - 3 sets - 10 reps - 5 sec hold - Supine March with Posterior Pelvic Tilt  - 1 x daily - 7 x weekly - 3 sets - 10 reps - Supine Shoulder Internal Rotation Stretch  - 1 x daily - 7 x weekly - 1 sets - 10 reps - Supine Shoulder External Rotation Stretch  - 1 x daily - 7 x weekly - 1 sets - 10 reps - Seated Scapular Retraction  - 1 x daily - 7 x weekly - 1 sets - 10 reps  AQUATIC  HEP: Access Code: [email or web address] URL: [email or web address] Date: 12/19/2022 Prepared by: Camille Bal  Exercises - Standing March at [facility]  - 1 x daily - 7 x weekly - 3 sets - 10 reps - Lateral Stepping at Pool Wall  - 1 x daily - 7 x weekly - 3 sets - 10 reps - Forward and Backward Stepping at [employer]  - 1 x daily - 7 x weekly - 3 sets - 10 reps - Bilateral Shoulder Flexion Extension AROM at [employer]  - 1 x daily - 7 x weekly - 3 sets - 10 reps - Hamstring Stretch at [employer]  - 1 x daily - 7 x weekly - 3 sets - 10 reps - Plank with Hip Extension at [employer]  - 1 x daily - 7 x weekly - 3 sets - 10 reps - Breaststroke Kick at [employer]  - 1 x daily - 7 x weekly - 3 sets - 10 reps  ASSESSMENT:  CLINICAL IMPRESSION: Aquatic PT completed today with patient tolerating well and demonstrating good stability and swimming capabilities.  He would benefit from establishment of aquatic HEP with review at next session to ensure independence in transition to community pool.  Will continue per POC.  OBJECTIVE IMPAIRMENTS: Abnormal gait, decreased endurance, decreased mobility, difficulty walking, improper body mechanics, postural dysfunction, and pain  ACTIVITY LIMITATIONS: carrying, lifting, and locomotion level  PARTICIPATION LIMITATIONS: meal prep, shopping, community activity, yard work, and church  PERSONAL FACTORS: Age, Fitness, Past/current experiences, and 1 comorbidity: lumbar disc herniation  are also affecting patient's functional outcome.   REHAB POTENTIAL: Good  CLINICAL DECISION MAKING: Stable/uncomplicated  EVALUATION COMPLEXITY: Low      GOALS: Goals reviewed with patient? Yes  SHORT TERM GOALS: Target date: 12/10/2022   Pt will be independent with initial HEP for improved strength, balance and activity tolerance  Baseline: not established on eval  Goal status: INITIAL    LONG TERM GOALS: Target date: 01/07/2023    Pt will be independent with  final land and aquatics HEP for improved strength, balance and activity tolerance  Baseline:  Goal status: INITIAL  2.  Pt will improve Modified Oswestry to </= 14 for reduced pain levels and improved QOL  Baseline: 20/50 Goal status: INITIAL   PLAN:  PT FREQUENCY: 1x/week  PT DURATION: 6 weeks  PLANNED INTERVENTIONS: Therapeutic exercises, Therapeutic activity, Neuromuscular re-education, Balance training, Gait training, Patient/Family education, Self Care, Joint mobilization, Vestibular training, Canalith repositioning, DME instructions, Aquatic Therapy, Dry Needling, Manual therapy, and Re-evaluation.  PLAN FOR NEXT SESSION: Land: how is initial HEP? Update STG. maybe introduce TPDN? Work on spinal mobility, core stability, HEP for weightbearing tolerance, core strength, L shoulder ROM and strengthening, postural stabilization  Aquatics: pt has silver sneakers and likes to swim. Review HEP for pool to ensure patient independence and help pt find a  pool to use    Camille Bal, PT, DPT 12/18/22, 9:41 AM

## 2022-12-19 NOTE — Patient Instructions (Signed)
AQUATIC HEP: Access Code: [email or web address] URL: [email or web address] Date: 12/19/2022 Prepared by: Camille Bal   Exercises - Standing March at [facility]  - 1 x daily - 7 x weekly - 3 sets - 10 reps - Lateral Stepping at Pool Wall  - 1 x daily - 7 x weekly - 3 sets - 10 reps - Forward and Backward Stepping at [employer]  - 1 x daily - 7 x weekly - 3 sets - 10 reps - Bilateral Shoulder Flexion Extension AROM at [employer]  - 1 x daily - 7 x weekly - 3 sets - 10 reps - Hamstring Stretch at [employer]  - 1 x daily - 7 x weekly - 3 sets - 10 reps - Plank with Hip Extension at [employer]  - 1 x daily - 7 x weekly - 3 sets - 10 reps - Breaststroke Kick at [employer]  - 1 x daily - 7 x weekly - 3 sets - 10 reps

## 2022-12-25 ENCOUNTER — Ambulatory Visit: Payer: Medicare PPO | Admitting: Physical Therapy

## 2022-12-25 DIAGNOSIS — M25512 Pain in left shoulder: Secondary | ICD-10-CM | POA: Diagnosis not present

## 2022-12-25 DIAGNOSIS — R2681 Unsteadiness on feet: Secondary | ICD-10-CM | POA: Diagnosis not present

## 2022-12-25 DIAGNOSIS — M6281 Muscle weakness (generalized): Secondary | ICD-10-CM

## 2022-12-25 DIAGNOSIS — M5459 Other low back pain: Secondary | ICD-10-CM | POA: Diagnosis not present

## 2022-12-25 DIAGNOSIS — R293 Abnormal posture: Secondary | ICD-10-CM | POA: Diagnosis not present

## 2022-12-26 ENCOUNTER — Encounter: Payer: Self-pay | Admitting: Physical Therapy

## 2022-12-26 NOTE — Therapy (Signed)
OUTPATIENT PHYSICAL THERAPY THORACOLUMBAR TREATMENT    Patient Name: [name] MRN: [id number] DOB:15-May-1940, 82 y.o., male Today's Date: 12/26/2022  END OF SESSION:  PT End of Session - 12/25/22 1100     Visit Number 5    Number of Visits 7    Date for PT Re-Evaluation 01/07/23    Authorization Type [employer]    PT Start Time 1100    PT Stop Time 1145    PT Time Calculation (min) 45 min    Activity Tolerance Patient tolerated treatment well    Behavior During Therapy WFL for tasks assessed/performed               Past Medical History:  Diagnosis Date   Allergic asthma    Barrett's esophagus    BPH (benign prostatic hyperplasia)    CAD (coronary artery disease)    CAD (coronary atherosclerotic disease)    Confusion    COPD (chronic obstructive pulmonary disease) (HCC)    Diabetes (HCC)    GERD (gastroesophageal reflux disease)    Heart attack (HCC)    x2   Hiatal hernia    Hyperlipidemia    Hypertension    Hypothyroidism    Irregular heart rhythm    Polypharmacy    Primary hypertension 08/14/2014   Psoriasis    Sleep apnea    wears CPAP nightly   Past Surgical History:  Procedure Laterality Date   CATARACT EXTRACTION Left 09/2021   COLONOSCOPY  03/2017   CORONARY ANGIOPLASTY WITH STENT PLACEMENT  08/15/2014   PCI of pLAD [employer] DES   EYE SURGERY  01/2017   eyelids lifted   LEFT HEART CATHETERIZATION WITH CORONARY ANGIOGRAM N/A 08/15/2014   Procedure: LEFT HEART CATHETERIZATION WITH CORONARY ANGIOGRAM;  Surgeon: Peter M Swaziland, MD;  Location: [facility] CATH LAB;  Service: Cardiovascular;  Laterality: N/A;   left knee arthroscopy Left 1994   NASAL POLYP EXCISION  1953   NASAL SINUS SURGERY     deviated septum   TONSILLECTOMY     TOTAL HIP ARTHROPLASTY Right 06/22/2017   Procedure: RIGHT TOTAL HIP ARTHROPLASTY ANTERIOR APPROACH;  Surgeon: Samson Frederic, MD;  Location: [facility] OR;  Service: Orthopedics;  Laterality: Right;  Needs RNFA   TOTAL  HIP REVISION Right 07/10/2017   Procedure: RIGHT TOTAL HIP REVISION FEMORAL COMPONENT;  Surgeon: Samson Frederic, MD;  Location: [facility] OR;  Service: Orthopedics;  Laterality: Right;  Needs RNFA   Patient Active Problem List   Diagnosis Date Noted   Pure hypercholesterolemia 07/03/2022   Erythrocytosis 05/28/2020   Macrocytosis without anemia 05/28/2020   DNR (do not resuscitate) 03/30/2019   Failed total hip arthroplasty, initial encounter (HCC) 07/10/2017   Primary osteoarthritis of right hip 06/22/2017   Osteoarthritis of right hip 06/22/2017   Weight gain 03/30/2015   Constipation 12/28/2014   Bilateral lower extremity edema 12/28/2014   RBBB 08/14/2014   Psoriasis 08/14/2014   Primary hypertension 08/14/2014   Hoarseness 06/15/2014   Rash and nonspecific skin eruption 06/15/2014   Submandibular swelling 05/15/2013   Asthma with acute exacerbation 08/18/2012   Eustachian tube dysfunction 04/05/2012   Allergic rhinitis due to pollen 06/28/2010   ABDOMINAL BRUIT 03/04/2010   Dyslipidemia 03/07/2009   Atherosclerosis of native coronary artery of native heart without angina pectoris 03/07/2009   BPH (benign prostatic hyperplasia) 03/07/2009   Hypothyroidism 03/06/2009   Allergic-infective asthma 04/06/2007   Centrilobular emphysema (HCC) 04/06/2007    PCP: Merri Brunette, MD  REFERRING PROVIDER: Sula Soda  P, MD  REFERRING DIAG: M51.26 (ICD-10-CM) - Lumbar disc herniation; M25.512 (ICD-10-CM) - Acute pain of left shoulder   Rationale for Evaluation and Treatment: Rehabilitation  THERAPY DIAG:  Muscle weakness (generalized)  Unsteadiness on feet  Abnormal posture  Other low back pain  Acute pain of left shoulder  ONSET DATE: 09/01/2022 (referral)  SUBJECTIVE:                                                                                                                                                                                           SUBJECTIVE  STATEMENT: Patient still planning to find a community pool for continued aquatic exercise.  PERTINENT HISTORY:  R THA in 2019   PAIN:  Are you having pain? No  PRECAUTIONS: Fall  RED FLAGS: None   WEIGHT BEARING RESTRICTIONS: No  FALLS:  Has patient fallen in last 6 months? No  LIVING ENVIRONMENT: Lives with: lives with their spouse and lives with their son Lives in: Other townhouse Stairs: Yes: External: 2 steps; none Has following equipment at home: Single point cane, Environmental consultant - 2 wheeled, Wheelchair (manual), shower chair, and Grab bars  OCCUPATION: Preaches sometimes, retired Secretary/administrator   PLOF: Needs assistance with homemaking  PATIENT GOALS: "I want to strengthen my back so I can stand up longer"  NEXT MD VISIT: 9/13 w/Dr. Carlis Abbott  OBJECTIVE:   DIAGNOSTIC FINDINGS:  X-ray of Lumbar spine from 10/09/22  Narrative & Impression  CLINICAL DATA:  Pain.   EXAM: LUMBAR SPINE - 2 VIEW   COMPARISON:  10/02/2006   FINDINGS: Degenerative changes at each lumbar level with disc space narrowing and marginal osteophytes. Facet joint degenerative changes L3-S1. Grade 1 L5 retrolisthesis.   IMPRESSION: Degenerative changes.  No acute osseous abnormalities identified.    PATIENT SURVEYS:  Modified Oswestry 20/50 (moderate disability)   COGNITION: Overall cognitive status: Within functional limits for tasks assessed     SENSATION: Denies numbness/tingling, but reports feeling a "cold wave" along his distal RLE. States he feels as though he has a "thin coating" around bilateral feet, mild neuropathy.    POSTURE: rounded shoulders, forward head, increased thoracic kyphosis, and weight shift right    LOWER EXTREMITY MMT:  Tested in seated position   MMT Right eval Left eval  Hip flexion 4 4  Hip extension    Hip abduction 4 4  Hip adduction 5 5  Hip internal rotation    Hip external rotation    Knee flexion 5 5  Knee extension 4+ 4+  Ankle  dorsiflexion 4+ 4+  Ankle plantarflexion    Ankle inversion    Ankle eversion     (  Blank rows = not tested)   Assessed on 11/28/22: UPPER EXTREMITY ROM:  Active ROM Right eval Left eval  Shoulder flexion  Limited to about 100 degrees (painful)  Shoulder extension    Shoulder abduction  Limited to about 100 degrees (painful)  Shoulder adduction    Shoulder extension  Decreased by about 10 degrees  Shoulder internal rotation  [facility]  Shoulder external rotation    Elbow flexion  WFL  Elbow extension  WFL  Wrist flexion    Wrist extension    Wrist ulnar deviation    Wrist radial deviation    Wrist pronation    Wrist supination     (Blank rows = not tested)     UPPER EXTREMITY MMT:  MMT Right eval Left eval  Shoulder flexion  4  Shoulder extension    Shoulder abduction  4  Shoulder adduction    Shoulder extension    Shoulder internal rotation  5  Shoulder external rotation  4 (pain in anterior shoulder)  Middle trapezius    Lower trapezius    Elbow flexion  5  Elbow extension  5  Wrist flexion    Wrist extension    Wrist ulnar deviation    Wrist radial deviation    Wrist pronation    Wrist supination    Grip strength     (Blank rows = not tested)   Pt with tenderness to palpation across L AC joint, does have a history of fracturing his clavicle around age 84.   TODAY'S TREATMENT:     12/25/22  Aquatic therapy at [facility] - pool temperature 92 degrees   Patient seen for aquatic therapy today.  Treatment took place in water 3.6-4.8 feet deep depending upon activity.  Patient entered and exited the pool via stairs using rail and reciprocal steps at modified independent level.   Exercises: Warmup: Unsupported water walking forwards, backwards, and laterally 4 x 18 feet each direction SBA  Reviewed HEP as below, modified breaststroke kick to perform vertically as needed to improve fluidity of movement and benefit; discussed modifications using increased BOS,  picking appropriate depth of water for support vs challenge, and using hand support on wall as needed for stability and safety.  -SKTC stretch against wall 2x45 seconds each LE -Hip circles w/ wall support x10 each LE -Plank jacks w/ return demo at wall x12  Patient requires buoyancy of the water for support for reduced fall risk with gait training and balance exercises with SBA-CGA support. Exercises able to be performed safely in water without the risk of fall compared to those same exercises performed on land; viscosity of water needed for resistance for strengthening. Current of water provides perturbations for challenging static and dynamic balance.   PATIENT EDUCATION:  Education details: List of community pools and laminated aquatic HEP provided as below.  Discouraged pt from performing backstroke in shallow water today. Person educated: Patient Education method: Explanation, Demonstration, and Handouts Education comprehension: verbalized understanding, returned demonstration, and needs further education  HOME EXERCISE PROGRAM: Pt has silver sneakers - give information on aquatics for aquatics HEP  Access Code: [email or web address] URL: [email or web address] Date: 11/28/2022 Prepared by: Peter Congo  Exercises - Supine Posterior Pelvic Tilt  - 1 x daily - 7 x weekly - 3 sets - 10 reps - Supine Bridge  - 1 x daily - 7 x weekly - 3 sets - 10 reps - 5 sec hold - Supine March with Posterior Pelvic Tilt  - 1 x daily -  7 x weekly - 3 sets - 10 reps - Supine Shoulder Internal Rotation Stretch  - 1 x daily - 7 x weekly - 1 sets - 10 reps - Supine Shoulder External Rotation Stretch  - 1 x daily - 7 x weekly - 1 sets - 10 reps - Seated Scapular Retraction  - 1 x daily - 7 x weekly - 1 sets - 10 reps  AQUATIC HEP: Access Code: [email or web address] URL: [email or web address] Date: 12/19/2022 Prepared by: Camille Bal  Exercises - Standing March at [facility]  - 1 x daily -  7 x weekly - 3 sets - 10 reps - Lateral Stepping at Pool Wall  - 1 x daily - 7 x weekly - 3 sets - 10 reps - Forward and Backward Stepping at [employer]  - 1 x daily - 7 x weekly - 3 sets - 10 reps - Bilateral Shoulder Flexion Extension AROM at [employer]  - 1 x daily - 7 x weekly - 3 sets - 10 reps - Hamstring Stretch at [employer]  - 1 x daily - 7 x weekly - 3 sets - 10 reps - Plank with Hip Extension at [employer]  - 1 x daily - 7 x weekly - 3 sets - 10 reps - Breaststroke Kick at [employer]  - 1 x daily - 7 x weekly - 3 sets - 10 reps  ASSESSMENT:  CLINICAL IMPRESSION: Aquatic PT reviewed today with goal of independence with HEP and helping pt establish community pool options.  He is able to safely exercise in an independent capacity with the HEP established and reviewed today with modifications as needed.  PT to discharge from pool therapy and he will likely be discharged from land therapy next visit.  OBJECTIVE IMPAIRMENTS: Abnormal gait, decreased endurance, decreased mobility, difficulty walking, improper body mechanics, postural dysfunction, and pain  ACTIVITY LIMITATIONS: carrying, lifting, and locomotion level  PARTICIPATION LIMITATIONS: meal prep, shopping, community activity, yard work, and church  PERSONAL FACTORS: Age, Fitness, Past/current experiences, and 1 comorbidity: lumbar disc herniation  are also affecting patient's functional outcome.   REHAB POTENTIAL: Good  CLINICAL DECISION MAKING: Stable/uncomplicated  EVALUATION COMPLEXITY: Low      GOALS: Goals reviewed with patient? Yes  SHORT TERM GOALS: Target date: 12/10/2022   Pt will be independent with initial HEP for improved strength, balance and activity tolerance  Baseline: not established on eval  Goal status: INITIAL    LONG TERM GOALS: Target date: 01/07/2023    Pt will be independent with final land and aquatics HEP for improved strength, balance and activity tolerance  Baseline:  Goal status:  INITIAL  2.  Pt will improve Modified Oswestry to </= 14 for reduced pain levels and improved QOL  Baseline: 20/50 Goal status: INITIAL   PLAN:  PT FREQUENCY: 1x/week  PT DURATION: 6 weeks  PLANNED INTERVENTIONS: Therapeutic exercises, Therapeutic activity, Neuromuscular re-education, Balance training, Gait training, Patient/Family education, Self Care, Joint mobilization, Vestibular training, Canalith repositioning, DME instructions, Aquatic Therapy, Dry Needling, Manual therapy, and Re-evaluation.  PLAN FOR NEXT SESSION: Land: how is initial HEP? Update STG. maybe introduce TPDN? Work on spinal mobility, core stability, HEP for weightbearing tolerance, core strength, L shoulder ROM and strengthening, postural stabilization    Camille Bal, PT, DPT 12/26/22, 4:55 PM

## 2022-12-31 ENCOUNTER — Ambulatory Visit: Payer: Medicare PPO | Admitting: Cardiology

## 2022-12-31 DIAGNOSIS — F332 Major depressive disorder, recurrent severe without psychotic features: Secondary | ICD-10-CM | POA: Diagnosis not present

## 2022-12-31 DIAGNOSIS — F411 Generalized anxiety disorder: Secondary | ICD-10-CM | POA: Diagnosis not present

## 2022-12-31 DIAGNOSIS — F4310 Post-traumatic stress disorder, unspecified: Secondary | ICD-10-CM | POA: Diagnosis not present

## 2023-01-01 ENCOUNTER — Ambulatory Visit: Payer: Medicare PPO | Attending: Physical Medicine and Rehabilitation | Admitting: Physical Therapy

## 2023-01-01 DIAGNOSIS — M6281 Muscle weakness (generalized): Secondary | ICD-10-CM | POA: Insufficient documentation

## 2023-01-01 DIAGNOSIS — R2681 Unsteadiness on feet: Secondary | ICD-10-CM | POA: Diagnosis not present

## 2023-01-01 NOTE — Therapy (Signed)
OUTPATIENT PHYSICAL THERAPY THORACOLUMBAR TREATMENT -DISCHARGE SUMMARY   Patient Name: [name] MRN: [id number] DOB:[birth year], 82 y.o., male Today's Date: 01/01/2023  PHYSICAL THERAPY DISCHARGE SUMMARY  Visits from Start of Care: 6  Current functional level related to goals / functional outcomes: Independent    Remaining deficits: Low fall risk, mild ADL limitation due to low back pain    Education / Equipment: Land and aquatic HEP   Patient agrees to discharge. Patient goals were met. Patient is being discharged due to meeting the stated rehab goals.   END OF SESSION:  PT End of Session - 01/01/23 1100     Visit Number 6    Number of Visits 7    Date for PT Re-Evaluation 01/07/23    Authorization Type [employer]    PT Start Time 1059    PT Stop Time 1135   DC   PT Time Calculation (min) 36 min    Activity Tolerance Patient tolerated treatment well    Behavior During Therapy WFL for tasks assessed/performed                Past Medical History:  Diagnosis Date   Allergic asthma    Barrett's esophagus    BPH (benign prostatic hyperplasia)    CAD (coronary artery disease)    CAD (coronary atherosclerotic disease)    Confusion    COPD (chronic obstructive pulmonary disease) (HCC)    Diabetes (HCC)    GERD (gastroesophageal reflux disease)    Heart attack (HCC)    x2   Hiatal hernia    Hyperlipidemia    Hypertension    Hypothyroidism    Irregular heart rhythm    Polypharmacy    Primary hypertension 08/14/2014   Psoriasis    Sleep apnea    wears CPAP nightly   Past Surgical History:  Procedure Laterality Date   CATARACT EXTRACTION Left 09/2021   COLONOSCOPY  03/2017   CORONARY ANGIOPLASTY WITH STENT PLACEMENT  08/15/2014   PCI of pLAD [employer] DES   EYE SURGERY  01/2017   eyelids lifted   LEFT HEART CATHETERIZATION WITH CORONARY ANGIOGRAM N/A 08/15/2014   Procedure: LEFT HEART CATHETERIZATION WITH CORONARY ANGIOGRAM;  Surgeon:  Peter M Swaziland, MD;  Location: [facility] CATH LAB;  Service: Cardiovascular;  Laterality: N/A;   left knee arthroscopy Left 1994   NASAL POLYP EXCISION  1953   NASAL SINUS SURGERY     deviated septum   TONSILLECTOMY     TOTAL HIP ARTHROPLASTY Right 06/22/2017   Procedure: RIGHT TOTAL HIP ARTHROPLASTY ANTERIOR APPROACH;  Surgeon: Samson Frederic, MD;  Location: [facility] OR;  Service: Orthopedics;  Laterality: Right;  Needs RNFA   TOTAL HIP REVISION Right 07/10/2017   Procedure: RIGHT TOTAL HIP REVISION FEMORAL COMPONENT;  Surgeon: Samson Frederic, MD;  Location: [facility] OR;  Service: Orthopedics;  Laterality: Right;  Needs RNFA   Patient Active Problem List   Diagnosis Date Noted   Pure hypercholesterolemia 07/03/2022   Erythrocytosis 05/28/2020   Macrocytosis without anemia 05/28/2020   DNR (do not resuscitate) 03/30/2019   Failed total hip arthroplasty, initial encounter (HCC) 07/10/2017   Primary osteoarthritis of right hip 06/22/2017   Osteoarthritis of right hip 06/22/2017   Weight gain 03/30/2015   Constipation 12/28/2014   Bilateral lower extremity edema 12/28/2014   RBBB 08/14/2014   Psoriasis 08/14/2014   Primary hypertension 08/14/2014   Hoarseness 06/15/2014   Rash and nonspecific skin eruption 06/15/2014   Submandibular swelling 05/15/2013   Asthma  with acute exacerbation 08/18/2012   Eustachian tube dysfunction 04/05/2012   Allergic rhinitis due to pollen 06/28/2010   ABDOMINAL BRUIT 03/04/2010   Dyslipidemia 03/07/2009   Atherosclerosis of native coronary artery of native heart without angina pectoris 03/07/2009   BPH (benign prostatic hyperplasia) 03/07/2009   Hypothyroidism 03/06/2009   Allergic-infective asthma 04/06/2007   Centrilobular emphysema (HCC) 04/06/2007    PCP: Merri Brunette, MD  REFERRING PROVIDER: Horton Chin, MD  REFERRING DIAG: M51.26 (ICD-10-CM) - Lumbar disc herniation; M25.512 (ICD-10-CM) - Acute pain of left shoulder   Rationale for Evaluation  and Treatment: Rehabilitation  THERAPY DIAG:  Unsteadiness on feet  Muscle weakness (generalized)  ONSET DATE: 09/01/2022 (referral)  SUBJECTIVE:                                                                                                                                                                                           SUBJECTIVE STATEMENT: Pt reports he is doing well. Does not know if he is doing his exercises properly.   PERTINENT HISTORY:  R THA in 2019   PAIN:  Are you having pain? No  PRECAUTIONS: Fall  RED FLAGS: None   WEIGHT BEARING RESTRICTIONS: No  FALLS:  Has patient fallen in last 6 months? No  LIVING ENVIRONMENT: Lives with: lives with their spouse and lives with their son Lives in: Other townhouse Stairs: Yes: External: 2 steps; none Has following equipment at home: Single point cane, Environmental consultant - 2 wheeled, Wheelchair (manual), shower chair, and Grab bars  OCCUPATION: Preaches sometimes, retired Secretary/administrator   PLOF: Needs assistance with homemaking  PATIENT GOALS: "I want to strengthen my back so I can stand up longer"  NEXT MD VISIT: 9/13 w/Dr. Carlis Abbott  OBJECTIVE:   DIAGNOSTIC FINDINGS:  X-ray of Lumbar spine from 10/09/22  Narrative & Impression  CLINICAL DATA:  Pain.   EXAM: LUMBAR SPINE - 2 VIEW   COMPARISON:  10/02/2006   FINDINGS: Degenerative changes at each lumbar level with disc space narrowing and marginal osteophytes. Facet joint degenerative changes L3-S1. Grade 1 L5 retrolisthesis.   IMPRESSION: Degenerative changes.  No acute osseous abnormalities identified.    PATIENT SURVEYS:  Modified Oswestry 20/50 (moderate disability)   COGNITION: Overall cognitive status: Within functional limits for tasks assessed     SENSATION: Denies numbness/tingling, but reports feeling a "cold wave" along his distal RLE. States he feels as though he has a "thin coating" around bilateral feet, mild neuropathy.    POSTURE:  rounded shoulders, forward head, increased thoracic kyphosis, and weight shift right    LOWER EXTREMITY MMT:  Tested in seated position  MMT Right eval Left eval  Hip flexion 4 4  Hip extension    Hip abduction 4 4  Hip adduction 5 5  Hip internal rotation    Hip external rotation    Knee flexion 5 5  Knee extension 4+ 4+  Ankle dorsiflexion 4+ 4+  Ankle plantarflexion    Ankle inversion    Ankle eversion     (Blank rows = not tested)   Assessed on 11/28/22: UPPER EXTREMITY ROM:  Active ROM Right eval Left eval  Shoulder flexion  Limited to about 100 degrees (painful)  Shoulder extension    Shoulder abduction  Limited to about 100 degrees (painful)  Shoulder adduction    Shoulder extension  Decreased by about 10 degrees  Shoulder internal rotation  [facility]  Shoulder external rotation    Elbow flexion  WFL  Elbow extension  WFL  Wrist flexion    Wrist extension    Wrist ulnar deviation    Wrist radial deviation    Wrist pronation    Wrist supination     (Blank rows = not tested)     UPPER EXTREMITY MMT:  MMT Right eval Left eval  Shoulder flexion  4  Shoulder extension    Shoulder abduction  4  Shoulder adduction    Shoulder extension    Shoulder internal rotation  5  Shoulder external rotation  4 (pain in anterior shoulder)  Middle trapezius    Lower trapezius    Elbow flexion  5  Elbow extension  5  Wrist flexion    Wrist extension    Wrist ulnar deviation    Wrist radial deviation    Wrist pronation    Wrist supination    Grip strength     (Blank rows = not tested)   Pt with tenderness to palpation across L AC joint, does have a history of fracturing his clavicle around age 73.   TODAY'S TREATMENT:     Ther Ex  Reviewed supine shoulder ER/IR from HEP per pt request. Pt unable to maintain 90-90 position of R shoulder to perform IR, but had no difficulty on L shoulder. Max verbal cues for proper technique, but pt continued to adduct R shoulder  when performing on R side.  Reviewed supine march w/PPT from HEP as pt has not been performing at home. Pt able to perform well.   Ther Act  Modified Oswestry: 14/50   PATIENT EDUCATION:  Education details: Review of HEP, reviewed importance of going to [facility] to continue aquatic HEP and continue land HEP at home, encouraged pt to return in 3 months if needed and educated on how to obtain new PT referral  Person educated: Patient Education method: Explanation, Demonstration, Verbal cues, and Handouts Education comprehension: verbalized understanding and returned demonstration  HOME EXERCISE PROGRAM: Pt has silver sneakers - give information on aquatics for aquatics HEP  Access Code: [email or web address] URL: [email or web address] Date: 11/28/2022 Prepared by: Peter Congo  Exercises - Supine Posterior Pelvic Tilt  - 1 x daily - 7 x weekly - 3 sets - 10 reps - Supine Bridge  - 1 x daily - 7 x weekly - 3 sets - 10 reps - 5 sec hold - Supine March with Posterior Pelvic Tilt  - 1 x daily - 7 x weekly - 3 sets - 10 reps - Supine Shoulder Internal Rotation Stretch  - 1 x daily - 7 x weekly - 1 sets - 10 reps - Supine Shoulder External Rotation Stretch  -  1 x daily - 7 x weekly - 1 sets - 10 reps - Seated Scapular Retraction  - 1 x daily - 7 x weekly - 1 sets - 10 reps  AQUATIC HEP: Access Code: [email or web address] URL: [email or web address] Date: 12/19/2022 Prepared by: Camille Bal  Exercises - Standing March at [facility]  - 1 x daily - 7 x weekly - 3 sets - 10 reps - Lateral Stepping at [place]  - 1 x daily - 7 x weekly - 3 sets - 10 reps - Forward and Backward Stepping at [employer]  - 1 x daily - 7 x weekly - 3 sets - 10 reps - Bilateral Shoulder Flexion Extension AROM at [place]  - 1 x daily - 7 x weekly - 3 sets - 10 reps - Hamstring Stretch at [employer]  - 1 x daily - 7 x weekly - 3 sets - 10 reps - Plank with Hip Extension at [employer]  - 1 x daily - 7 x weekly  - 3 sets - 10 reps - Breaststroke Kick at [employer]  - 1 x daily - 7 x weekly - 3 sets - 10 reps  ASSESSMENT:  CLINICAL IMPRESSION: Emphasis of skilled PT session on LTG assessment and DC from PT. Pt has met 2 of 2 LTGs, performing his land and aquatic HEP independently and improving his score on modified Oswestry to 14/50, indicative of mild limitation due to back pain. Reviewed components of HEP per pt request and pt able to demonstrate well w/min verbal cues for proper shoulder placement. Pt in agreement to DC this date and plans on returning to the [facility] as he has [employer].   OBJECTIVE IMPAIRMENTS: Abnormal gait, decreased endurance, decreased mobility, difficulty walking, improper body mechanics, postural dysfunction, and pain  ACTIVITY LIMITATIONS: carrying, lifting, and locomotion level  PARTICIPATION LIMITATIONS: meal prep, shopping, community activity, yard work, and church  PERSONAL FACTORS: Age, Fitness, Past/current experiences, and 1 comorbidity: lumbar disc herniation  are also affecting patient's functional outcome.   REHAB POTENTIAL: Good  CLINICAL DECISION MAKING: Stable/uncomplicated  EVALUATION COMPLEXITY: Low      GOALS: Goals reviewed with patient? Yes  SHORT TERM GOALS: Target date: 12/10/2022   Pt will be independent with initial HEP for improved strength, balance and activity tolerance  Baseline: not established on eval  Goal status: MET    LONG TERM GOALS: Target date: 01/07/2023    Pt will be independent with final land and aquatics HEP for improved strength, balance and activity tolerance  Baseline:  Goal status: MET  2.  Pt will improve Modified Oswestry to </= 14 for reduced pain levels and improved QOL  Baseline: 20/50; 14/50  Goal status: MET   PLAN:  PT FREQUENCY: 1x/week  PT DURATION: 6 weeks  PLANNED INTERVENTIONS: Therapeutic exercises, Therapeutic activity, Neuromuscular re-education, Balance training, Gait  training, Patient/Family education, Self Care, Joint mobilization, Vestibular training, Canalith repositioning, DME instructions, Aquatic Therapy, Dry Needling, Manual therapy, and Re-evaluation.   Josephine Igo, PT, DPT [facility] [address] [place], [place]  [address] Phone:  [phone] Fax:  [phone] 01/01/23, 11:37 AM

## 2023-01-09 ENCOUNTER — Encounter: Payer: Medicare PPO | Attending: Physical Medicine and Rehabilitation | Admitting: Physical Medicine and Rehabilitation

## 2023-01-09 ENCOUNTER — Encounter: Payer: Self-pay | Admitting: Physical Medicine and Rehabilitation

## 2023-01-09 VITALS — BP 124/80 | HR 96 | Ht 71.0 in | Wt 230.0 lb

## 2023-01-09 DIAGNOSIS — M545 Low back pain, unspecified: Secondary | ICD-10-CM | POA: Diagnosis not present

## 2023-01-09 DIAGNOSIS — I10 Essential (primary) hypertension: Secondary | ICD-10-CM | POA: Insufficient documentation

## 2023-01-09 DIAGNOSIS — M25512 Pain in left shoulder: Secondary | ICD-10-CM | POA: Insufficient documentation

## 2023-01-09 DIAGNOSIS — G6289 Other specified polyneuropathies: Secondary | ICD-10-CM | POA: Diagnosis not present

## 2023-01-09 MED ORDER — CAPSAICIN-CLEANSING GEL 8 % EX KIT
1.0000 | PACK | Freq: Once | CUTANEOUS | Status: AC
Start: 2023-01-09 — End: 2023-01-09
  Administered 2023-01-09: 1 via TOPICAL

## 2023-01-09 NOTE — Progress Notes (Unsigned)
Subjective:    Patient ID: [name], male    DOB: 17-Jan-1941, 82 y.o.   MRN: [id number]  HPI [name] is an 82 year old man who presents to establish care for herniated disc pain.   1) Herniated lumbar spine discs -has been severe at times -pain is returning, ready for Qutenza today -spinal pain has greatly improved! -tramadol helps -would like to try tens unit -Qutenza helped! -has not heard of red light therapy -he would like to get repeat imaging of his back -likes the pain journal -steroid injections no longer work- he used to get them from [name] -he feels there has got to be sometime else that can be done.  -he respects that Dr. Renne Crigler is is not a pill pusher, respects that he is a physician first -last steroid injection seemed to last hardly at all -recently the pain has been severe. -stopped the naproxen -he continues to use tylenol and using heating pad   2) Megaloblastic anemia: -he is not sure if B12 and folate were checked  3) Acute left shoulder pain: -started when he was just sitting -he is feeling better today -therapy has helped  4) HTN -takes amlodipine    Pain Inventory Average Pain  left shoulder 10 sharp, back 3 Pain Right Now 2 My pain is intermittent and constant  In the last 24 hours, has pain interfered with the following? General activity 0 Relation with others 0 Enjoyment of life  3 What TIME of day is your pain at its worst? 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  Tobacco Use   Smoking status: Former    Current packs/day: 0.00    Average packs/day: 3.0 packs/day for 33.0 years (99.0 ttl pk-yrs)    Types: Pipe, Cigars, Cigarettes    Start date: 04/28/1952    Quit date: 04/28/1985    Years since quitting: 37.7   Smokeless tobacco: Never  Vaping Use   Vaping status: Never Used  Substance and Sexual Activity   Alcohol use: Yes    Alcohol/week: 2.0 standard drinks of alcohol    Types: 1 Glasses of wine, 1 Shots of liquor per week    Comment: rare   Drug use: No   Sexual activity: Not on file  Other Topics Concern   Not on file  Social History Narrative   04/11/21 Lives with wife   Coffee, tea   Social Determinants of Health   Financial Resource Strain: Low Risk  (03/18/2021)   Received from [facility], [facility]   Overall Financial Resource Strain (CARDIA)    Difficulty of Paying Living Expenses: Not very hard  Food Insecurity: Not on file  Transportation Needs: Not on file  Physical Activity: Not on file  Stress: Stress Concern Present (03/18/2021)  Received from [employer], [facility]   [employer] [facility] - Occupational Stress Questionnaire    Feeling of Stress : To some extent  Social Connections: Unknown (09/10/2021)   Received from [facility], [facility]   Social Network    Social Network: Not on file   Past Surgical History:  Procedure Laterality Date   CATARACT EXTRACTION Left 09/2021   COLONOSCOPY  03/2017   CORONARY ANGIOPLASTY WITH STENT PLACEMENT  08/15/2014   PCI of pLAD Xience alpine DES   EYE SURGERY  01/2017   eyelids lifted   LEFT HEART CATHETERIZATION WITH CORONARY ANGIOGRAM N/A 08/15/2014   Procedure: LEFT HEART CATHETERIZATION WITH CORONARY ANGIOGRAM;  Surgeon: Peter M Swaziland, MD;  Location: [facility] CATH LAB;  Service: Cardiovascular;  Laterality: N/A;   left knee arthroscopy Left  1994   NASAL POLYP EXCISION  1953   NASAL SINUS SURGERY     deviated septum   TONSILLECTOMY     TOTAL HIP ARTHROPLASTY Right 06/22/2017   Procedure: RIGHT TOTAL HIP ARTHROPLASTY ANTERIOR APPROACH;  Surgeon: Samson Frederic, MD;  Location: [facility] OR;  Service: Orthopedics;  Laterality: Right;  Needs RNFA   TOTAL HIP REVISION Right 07/10/2017   Procedure: RIGHT TOTAL HIP REVISION FEMORAL COMPONENT;  Surgeon: Samson Frederic, MD;  Location: [facility] OR;  Service: Orthopedics;  Laterality: Right;  Needs RNFA   Past Surgical History:  Procedure Laterality Date   CATARACT EXTRACTION Left 09/2021   COLONOSCOPY  03/2017   CORONARY ANGIOPLASTY WITH STENT PLACEMENT  08/15/2014   PCI of pLAD Xience alpine DES   EYE SURGERY  01/2017   eyelids lifted   LEFT HEART CATHETERIZATION WITH CORONARY ANGIOGRAM N/A 08/15/2014   Procedure: LEFT HEART CATHETERIZATION WITH CORONARY ANGIOGRAM;  Surgeon: Peter M Swaziland, MD;  Location: [facility] CATH LAB;  Service: Cardiovascular;  Laterality: N/A;   left knee arthroscopy Left 1994   NASAL POLYP EXCISION  1953   NASAL SINUS SURGERY     deviated septum   TONSILLECTOMY     TOTAL HIP ARTHROPLASTY Right 06/22/2017   Procedure: RIGHT TOTAL HIP ARTHROPLASTY ANTERIOR APPROACH;  Surgeon: Samson Frederic, MD;  Location: [facility] OR;  Service: Orthopedics;  Laterality: Right;  Needs RNFA   TOTAL HIP REVISION Right 07/10/2017   Procedure: RIGHT TOTAL HIP REVISION FEMORAL COMPONENT;  Surgeon: Samson Frederic, MD;  Location: [facility] OR;  Service: Orthopedics;  Laterality: Right;  Needs RNFA   Past Medical History:  Diagnosis Date   Allergic asthma    Barrett's esophagus    BPH (benign prostatic hyperplasia)    CAD (coronary artery disease)    CAD (coronary atherosclerotic disease)    Confusion    COPD (chronic obstructive pulmonary disease) (HCC)    Diabetes (HCC)    GERD (gastroesophageal reflux disease)    Heart attack (HCC)    x2   Hiatal hernia    Hyperlipidemia    Hypertension     Hypothyroidism    Irregular heart rhythm    Polypharmacy    Primary hypertension 08/14/2014   Psoriasis    Sleep apnea    wears CPAP nightly   BP 124/80   Pulse 96   Ht 5\' 11"  (1.803 m)   Wt 230 lb (104.3 kg)   SpO2 96%   BMI 32.08 kg/m   Opioid Risk Score:   Fall Risk Score:  `1  Depression screen [facility] 2/9     01/09/2023   10:07 AM 11/10/2022   11:25 AM 10/09/2022  9:44 AM 09/01/2022    9:15 AM  Depression screen PHQ 2/9  Decreased Interest 0 1 1 1   Down, Depressed, Hopeless 0 1 1 1   PHQ - 2 Score 0 2 2 2   Altered sleeping    0  Tired, decreased energy    2  Change in appetite    2  Feeling bad or failure about yourself     0  Trouble concentrating    0  Moving slowly or fidgety/restless    0  Suicidal thoughts    0  PHQ-9 Score    6  Difficult doing work/chores    Not difficult at all    Review of Systems     Objective:   Physical Exam Gen: no distress, normal appearing HEENT: oral mucosa pink and moist, NCAT Cardio: Reg rate Chest: normal effort, normal rate of breathing Abd: soft, non-distended Ext: no edema Psych: pleasant, normal affect Musculoskeletal: L shoulder with full ROM       Assessment & Plan:   1) Chronic Pain Syndrome secondary to herniated lumbar spine discs -discussed his current pain regimen -continue walking, recommended increasing 4 times per week 30 minutes -encouraged walking with his friend  -discussed aquatherapy   -encouraged red light therapy   -encouraged heating pad since that sends the blood flow to the area.   -Discussed Qutenza as an option for neuropathic pain control. Discussed that this is a capsaicin patch, stronger than capsaicin cream. Discussed that it is currently approved for diabetic peripheral neuropathy and post-herpetic neuralgia, but that it has also shown benefit in treating other forms of neuropathy. Provided patient with link to site to learn more about the patch: https://www.clark.biz/. Discussed  that the patch would be placed in office and benefits usually last 3 months. Discussed that unintended exposure to capsaicin can cause severe irritation of eyes, mucous membranes, respiratory tract, and skin, but that Qutenza is a local treatment and does not have the systemic side effects of other nerve medications. Discussed that there may be pain, itching, erythema, and decreased sensory function associated with the application of Qutenza. Side effects usually subside within 1 week. A cold pack of analgesic medications can help with these side effects. Blood pressure can also be increased due to pain associated with administration of the patch.   1 patch of Qutenza was applied to the area of pain. Ice packs were applied during the procedure to ensure patient comfort. Blood pressure was monitored every 15 minutes. The patient tolerated the procedure well. Post-procedure instructions were given and follow-up has been scheduled.    Prescribing Home Zynex NexWave Stimulator Device and supplies as needed. IFC, NMES and TENS medically necessary Treatment Rx: Daily @ 30-40 minutes per treatment PRN. Zynex NexWave only, no substitutions. Treatment Goals: 1) To reduce and/or eliminate pain 2) To improve functional capacity and Activities of daily living 3) To reduce or prevent the need for oral medications 4) To improve circulation in the injured region 5) To decrease or prevent muscle spasm and muscle atrophy 6) To provide a self-management tool to the patient The patient has not sufficiently improved with conservative care. Numerous studies indexed by Medline and PubMed.gov have shown Neuromuscular, Interferential, and TENS stimulators to reduce pain, improve function, and reduce medication use in injured patients. Continued use of this evidence based, safe, drug free treatment is both reasonable and medically necessary at this time.    Prescribed Zynex Nexwave, asked CJ to teach patient to use device    -  Provided with a pain relief journal and discussed that it contains foods and lifestyle tips to naturally help to improve pain. Discussed that these lifestyle strategies are also very good for health unlike some medications which can have negative side effects. Discussed that the act of keeping a journal can be therapeutic and helpful to realize patterns what helps to trigger and alleviate pain.     -Discussed current symptoms of pain and history of pain.  -Discussed benefits of exercise in reducing pain. -Discussed following foods that may reduce pain: 1) Ginger (especially studied for arthritis)- reduce leukotriene production to decrease inflammation 2) Blueberries- high in phytonutrients that decrease inflammation 3) Salmon- marine omega-3s reduce joint swelling and pain 4) Pumpkin seeds- reduce inflammation 5) dark chocolate- reduces inflammation 6) turmeric- reduces inflammation 7) tart cherries - reduce pain and stiffness 8) extra virgin olive oil - its compound olecanthal helps to block prostaglandins  9) chili peppers- can be eaten or applied topically via capsaicin 10) mint- helpful for headache, muscle aches, joint pain, and itching 11) garlic- reduces inflammation   Link to further information on diet for chronic pain: http://www.bray.com/      2) Daytime somnolence: -discussed that B12 deficiency could contribute -recommended B12, methylfolate, and B6 combined supplement -check vitamin B12, folate, testosterone levels -discussed that he completed prescription vitamin D -stop the melatonin -try a cup of tart cherry juice with dinner   3) Low HDL: -discussed that lack of exercise can contribute to this -provided list of foods to help increase HDL   4) Polypharmacy:  -discussed his desire to be on less medication -discussed that Protonix can be cause of the B12 deficiency.    5) Diabetic peripheral  neuropathy: -Discussed Qutenza as an option for neuropathic pain control. Discussed that this is a capsaicin patch, stronger than capsaicin cream. Discussed that it is currently approved for diabetic peripheral neuropathy and post-herpetic neuralgia, but that it has also shown benefit in treating other forms of neuropathy. Provided patient with link to site to learn more about the patch: https://www.clark.biz/. Discussed that the patch would be placed in office and benefits usually last 3 months. Discussed that unintended exposure to capsaicin can cause severe irritation of eyes, mucous membranes, respiratory tract, and skin, but that Qutenza is a local treatment and does not have the systemic side effects of other nerve medications. Discussed that there may be pain, itching, erythema, and decreased sensory function associated with the application of Qutenza. Side effects usually subside within 1 week. A cold pack of analgesic medications can help with these side effects. Blood pressure can also be increased due to pain associated with administration of the patch.    -prescribed under desk elliptical   -discussed that he feels there is a sheet of paper on his feet  6) Left shoulder pain:  -continue HEP -discussed benefits from PT  7) HTN -discussed BP is very well controlled!  40 minutes spent in discussion of risks and benefits of Qutenza and obtaining informed consent, discussion of q90 day follow-up and expectation of improvement in pain with each repeat application, discussed left shoulder pain and improvement with PT, discussed that he would be interested in aquatherapy for his back, discussed that he had no burning from the Qutenza treatment, discussed benefits of under the desk elliptical in improving blood flow and circulation and provided with a script

## 2023-01-11 DIAGNOSIS — M5126 Other intervertebral disc displacement, lumbar region: Secondary | ICD-10-CM | POA: Diagnosis not present

## 2023-01-11 DIAGNOSIS — G894 Chronic pain syndrome: Secondary | ICD-10-CM | POA: Diagnosis not present

## 2023-01-12 ENCOUNTER — Encounter: Payer: Self-pay | Admitting: Pulmonary Disease

## 2023-01-12 ENCOUNTER — Ambulatory Visit (INDEPENDENT_AMBULATORY_CARE_PROVIDER_SITE_OTHER): Payer: Medicare PPO | Admitting: Pulmonary Disease

## 2023-01-12 VITALS — BP 110/70 | HR 93 | Ht 71.0 in | Wt 229.4 lb

## 2023-01-12 DIAGNOSIS — J432 Centrilobular emphysema: Secondary | ICD-10-CM | POA: Diagnosis not present

## 2023-01-12 DIAGNOSIS — G4733 Obstructive sleep apnea (adult) (pediatric): Secondary | ICD-10-CM

## 2023-01-12 NOTE — Progress Notes (Signed)
[name]    [id number]    03-19-1941  Primary Care Physician:Pharr, Zollie Beckers, MD  Referring Physician: Merri Brunette, MD [address] [place],  [place] [address]  Chief complaint:   In for follow-up for mild obstructive sleep apnea  HPI:  Mild obstructive sleep apnea on home sleep study He remains on auto CPAP Compliant with use  Continues to benefit from CPAP use Uses CPAP nightly  Download is not available today  [employer] is his DME company  Still does have daytime sleepiness, takes a daily nap and sometimes may sleep for up to 3 to 4 hours It is just a habit that has had over time  He wakes up in the morning feeling like he is at a good nights rest  No significant changes in his health Does have chronic back pain  Does have a history of severe obstructive lung disease, uses a nebulizer treatment whenever he feels he needs to Has not been on any maintenance inhalers  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   acetaminophen (TYLENOL) 650 MG CR tablet TAKE 1 TAB GIVE EVERY 6 HOURS AS NEEDED FOR FEVER   albuterol (PROVENTIL HFA;VENTOLIN HFA) 108 (90 BASE) MCG/ACT inhaler Inhale 1-2 puffs into the lungs every 6 (six) hours as needed for wheezing or shortness of breath.   albuterol (PROVENTIL) (2.5 MG/3ML) 0.083% nebulizer solution SMARTSIG:3 Milliliter(s) Via Nebulizer Every 6 Hours PRN   ALPRAZolam (XANAX) 0.25 MG tablet Take 1 tablet by mouth 3 (three) times daily as needed.   amLODipine (NORVASC) 5 MG tablet TAKE 1 TABLET BY MOUTH DAILY *NEED APPOINTMENT FOR  REFILLS*   ARIPiprazole (ABILIFY) 5 MG tablet 1 tablet Oral Once a day for 90 days   aspirin EC 81 MG tablet Take 81 mg by mouth daily.   atorvastatin (LIPITOR) 40 MG tablet every evening.   bumetanide (BUMEX) 1 MG tablet TAKE 2 TABLETS BY MOUTH EVERY MORNING AND 1 TABLET AT 2PM AS DIRECTED   capsaicin topical system (QUTENZA, 4 PATCH,) 8 % MD TO APPLY 4 PATCHES TOPICALLY FOR 1 DOSE EVERY 90 DAYS   Cholecalciferol (VITAMIN D3) 1000 UNITS CAPS Take 1,000 Units by mouth daily.   clobetasol (TEMOVATE) 0.05 % GEL APPLY A SMALL AMOUNT TO AFFECTED AREA TWICE A DAY AS NEEDED   Cyanocobalamin (VITAMIN B-12) 5000 MCG TBDP Take 5,000 mcg by mouth daily.   cycloSPORINE (RESTASIS) 0.05 % ophthalmic emulsion    docusate sodium (COLACE) 100 MG capsule Take 100 mg by mouth every other day.   ezetimibe (ZETIA) 10 MG tablet Take 1 tablet (10 mg total) by mouth every evening.   fexofenadine (ALLEGRA) 180 MG tablet Take 180 mg by mouth daily.   fluticasone (CUTIVATE) 0.05 % cream Apply 1 application topically 2 (two) times daily.   fluticasone (FLONASE) 50 MCG/ACT nasal spray Place into both nostrils daily.   hydrOXYzine (ATARAX) 25 MG tablet Take 1 tablet by mouth 2 (two) times daily as needed.   ketoconazole (NIZORAL) 2 % cream APPLY TOPICALLY TO GROIN RASH ONCE DAILY   KLOR-CON M10 10 MEQ tablet  TAKE 1 TABLET (10 MEQ TOTAL) BY MOUTH DAILY. PLEASE CALL TO SCHEDULE APPOINTMENT FOR FUTURE REFILLS.   levothyroxine (SYNTHROID) 125 MCG tablet 125 ugs by oral route.   lidocaine (LIDODERM) 5 % Place 1 patch onto the skin daily. Remove & Discard patch within 12 hours or as directed by MD   Lutein-Zeaxanthin 25-5 MG CAPS 1 capsule by oral route.   melatonin 5 MG TABS 1 tablet at bedtime.   Methylcobalamin 5000 MCG TBDP    mirtazapine (REMERON) 30 MG tablet Take 30 mg by mouth at bedtime.   Multiple Vitamin (MULTIVITAMIN) capsule Take 1 capsule by mouth daily.   naproxen (NAPROSYN) 500 MG tablet Take 1 tablet (500 mg  total) by mouth 2 (two) times daily.   nitroGLYCERIN (NITROLINGUAL) 0.4 MG/SPRAY spray Place 1 spray under the tongue every 5 (five) minutes x 3 doses as needed for chest pain. Use as directed as needed   NON FORMULARY CPAP machine every night   Omega-3 1000 MG CAPS    pantoprazole (PROTONIX) 40 MG tablet Take 30-60 min before first meal of the day (Patient taking differently: Take 40 mg by mouth daily before breakfast.)   Semaglutide,0.25 or 0.5MG /DOS, (OZEMPIC, 0.25 OR 0.5 MG/DOSE,) 2 MG/1.5ML SOPN    senna (SENOKOT) 8.6 MG tablet TAKE 1 TAB AT BEDTIME FOR CONSTIPATION   sildenafil (VIAGRA) 100 MG tablet Take 100 mg by mouth daily as needed.   Thiamine HCl (THIAMINE PO) TAKE 1 TAB DAILY FOR REPLETION   traMADol (ULTRAM) 50 MG tablet Take 1 tablet 3 times a day by oral route as needed for pain for 5 days.   Vitamin D, Ergocalciferol, (DRISDOL) 1.25 MG (50000 UNIT) CAPS capsule TAKE 1 CAPSULE (50,000 UNITS TOTAL) BY MOUTH EVERY 7 (SEVEN) DAYS   zinc sulfate 220 (50 Zn) MG capsule TAKE 1 CAP DAILY FOR REPLETION   [DISCONTINUED] ASCORBIC ACID PO 1000 mg by oral route.   [DISCONTINUED] ibuprofen (ADVIL) 600 MG tablet Take 1 tablet by mouth 3 (three) times daily.   [DISCONTINUED] levothyroxine (SYNTHROID) 112 MCG tablet TAKE 1 TABLET BY MOUTH EVERY DAY IN THE MORNING ON EMPTY STOMACH FOR 30 DAYS   [DISCONTINUED] levothyroxine (SYNTHROID) 137 MCG tablet Take 1 tablet by mouth daily.   [DISCONTINUED] levothyroxine (SYNTHROID) 88 MCG tablet Take 88 mcg by mouth daily.   [DISCONTINUED] mirtazapine (REMERON) 15 MG tablet Take 30 mg by mouth at bedtime.   [DISCONTINUED] traZODone (DESYREL) 50 MG tablet TAKE 1 TABLET BY MOUTH EVERY DAY AT BEDTIME AS NEEDED   [DISCONTINUED] triamcinolone ointment (KENALOG) 0.1 % APPLY 1 APPLICATION TOPICALLY EVERY DAY FOR 30 DAYS   No facility-administered encounter medications on file as of 01/12/2023.    Allergies as of 01/12/2023 - Review Complete 01/12/2023  Allergen  Reaction Noted   Slo-niacin [niacin er] Other (See Comments) 05/02/2022    Past Medical History:  Diagnosis Date   Allergic asthma    Barrett's esophagus    BPH (benign prostatic hyperplasia)    CAD (coronary artery disease)    CAD (coronary atherosclerotic disease)    Confusion    COPD (chronic obstructive pulmonary disease) (HCC)    Diabetes (HCC)    GERD (gastroesophageal reflux disease)    Heart attack (HCC)    x2   Hiatal hernia    Hyperlipidemia    Hypertension    Hypothyroidism    Irregular heart rhythm    Polypharmacy    Primary hypertension 08/14/2014   Psoriasis    Sleep apnea  wears CPAP nightly    Past Surgical History:  Procedure Laterality Date   CATARACT EXTRACTION Left 09/2021   COLONOSCOPY  03/2017   CORONARY ANGIOPLASTY WITH STENT PLACEMENT  08/15/2014   PCI of pLAD Xience alpine DES   EYE SURGERY  01/2017   eyelids lifted   LEFT HEART CATHETERIZATION WITH CORONARY ANGIOGRAM N/A 08/15/2014   Procedure: LEFT HEART CATHETERIZATION WITH CORONARY ANGIOGRAM;  Surgeon: Peter M Swaziland, MD;  Location: [facility] CATH LAB;  Service: Cardiovascular;  Laterality: N/A;   left knee arthroscopy Left 1994   NASAL POLYP EXCISION  1953   NASAL SINUS SURGERY     deviated septum   TONSILLECTOMY     TOTAL HIP ARTHROPLASTY Right 06/22/2017   Procedure: RIGHT TOTAL HIP ARTHROPLASTY ANTERIOR APPROACH;  Surgeon: Samson Frederic, MD;  Location: [facility] OR;  Service: Orthopedics;  Laterality: Right;  Needs RNFA   TOTAL HIP REVISION Right 07/10/2017   Procedure: RIGHT TOTAL HIP REVISION FEMORAL COMPONENT;  Surgeon: Samson Frederic, MD;  Location: [facility] OR;  Service: Orthopedics;  Laterality: Right;  Needs RNFA    Family History  Problem Relation Age of Onset   Dementia Mother    COPD Mother    Tuberculosis Mother    Lung cancer Father    Suicidality Father    Other Father        Suicide    Social History   Socioeconomic History   Marital status: Married    Spouse name: Dewayne Hatch    Number of children: 2   Years of education: post grad   Highest education level: Not on file  Occupational History   Occupation: Optician, 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  Tobacco Use   Smoking status: Former    Current packs/day: 0.00    Average packs/day: 3.0 packs/day for 33.0 years (99.0 ttl pk-yrs)    Types: Pipe, Cigars, Cigarettes    Start date: 04/28/1952    Quit date: 04/28/1985    Years since quitting: 37.7   Smokeless tobacco: Never  Vaping Use   Vaping status: Never Used  Substance and Sexual Activity   Alcohol use: Yes    Alcohol/week: 2.0 standard drinks of alcohol    Types: 1 Glasses of wine, 1 Shots of liquor per week    Comment: rare   Drug use: No   Sexual activity: Not on file  Other Topics Concern   Not on file  Social History Narrative   04/11/21 Lives with wife   Coffee, tea   Social Determinants of Health   Financial Resource Strain: Low Risk  (03/18/2021)   Received from [facility], [facility]   Overall Financial Resource Strain (CARDIA)    Difficulty of Paying Living Expenses: Not very hard  Food Insecurity: Not on file  Transportation Needs: Not on file  Physical Activity: Not on file  Stress: Stress Concern Present (03/18/2021)   Received from [facility], [facility]   [employer] of Occupational Health - Occupational Stress Questionnaire    Feeling of Stress : To some extent  Social Connections: Unknown (09/10/2021)   Received from [facility], [facility]   Social Network    Social Network: Not on file  Intimate Partner Violence: Unknown (08/02/2021)   Received from [facility], [facility]   HITS    Physically Hurt: Not on file    Insult or Talk Down To: Not on file    Threaten Physical Harm: Not on file    Scream or Curse: Not  on file    Review of Systems  HENT:  Negative for congestion.   Respiratory:  Positive for apnea and shortness of breath.   Musculoskeletal:  Positive for arthralgias and back pain.   Psychiatric/Behavioral:  Positive for sleep disturbance.     Vitals:   01/12/23 1002  BP: 110/70  Pulse: 93  SpO2: 98%      Physical Exam Constitutional:      Appearance: He is obese.  HENT:     Head: Normocephalic.     Mouth/Throat:     Mouth: Mucous membranes are moist.  Eyes:     Pupils: Pupils are equal, round, and reactive to light.  Cardiovascular:     Rate and Rhythm: Normal rate.     Heart sounds: No murmur heard.    No friction rub.  Pulmonary:     Effort: No respiratory distress.     Breath sounds: No stridor. No wheezing or rhonchi.  Musculoskeletal:     Cervical back: No rigidity or tenderness.  Neurological:     Mental Status: He is alert.  Psychiatric:        Mood and Affect: Mood normal.       08/07/2021   10:00 AM  Results of the Epworth flowsheet  Sitting and reading 2  Watching TV 3  Sitting, inactive in a public place (e.g. a theatre or a meeting) 0  As a passenger in a car for an hour without a break 0  Lying down to rest in the afternoon when circumstances permit 3  Sitting and talking to someone 0  Sitting quietly after a lunch without alcohol 0  In a car, while stopped for a few minutes in traffic 0  Total score 8    Data Reviewed: No download from his machine is available  Recent home sleep study did reveal mild obstructive sleep apnea with an AHI of 10.6, mild oxygen desaturations  No download available  Assessment:  History of obstructive sleep apnea -Mild obstructive sleep apnea on recent study -Has been using CPAP on a nightly basis -Feels a lot better with using CPAP  History of obstructive lung disease -Does have significant bronchodilator response on last PFT in June 2023 -Nebulization treatment as needed  Severe obstructive airway disease on recent PFT  History of coronary artery disease -Appears stable  History of depression -Controlled  Plan/Recommendations: Continue auto CPAP  Will contact DME company  to see whether we can get a download  Graded activities as tolerated  Encouraged to decrease nap time during the day to less than 2 hours, set an alarm to wake him up to not sleep for up to 4 hours during the day  Continue bronchodilators as needed  Follow-up in 6 months  Virl Diamond MD [facility] 01/12/2023, 10:18 AM  CC: Merri Brunette, MD

## 2023-01-12 NOTE — Patient Instructions (Addendum)
Continue using your CPAP on a nightly basis  Call us with significant concerns  Follow-up in 6 months  We do not need to make any changes at the present time  Try and limit your napping hours during the day to less than 2 hours and good

## 2023-01-28 ENCOUNTER — Ambulatory Visit: Payer: Medicare PPO | Admitting: Cardiology

## 2023-02-04 DIAGNOSIS — F411 Generalized anxiety disorder: Secondary | ICD-10-CM | POA: Diagnosis not present

## 2023-02-04 DIAGNOSIS — F4310 Post-traumatic stress disorder, unspecified: Secondary | ICD-10-CM | POA: Diagnosis not present

## 2023-02-04 DIAGNOSIS — F332 Major depressive disorder, recurrent severe without psychotic features: Secondary | ICD-10-CM | POA: Diagnosis not present

## 2023-02-09 DIAGNOSIS — Z23 Encounter for immunization: Secondary | ICD-10-CM | POA: Diagnosis not present

## 2023-02-10 DIAGNOSIS — M5126 Other intervertebral disc displacement, lumbar region: Secondary | ICD-10-CM | POA: Diagnosis not present

## 2023-02-10 DIAGNOSIS — G894 Chronic pain syndrome: Secondary | ICD-10-CM | POA: Diagnosis not present

## 2023-02-26 ENCOUNTER — Other Ambulatory Visit: Payer: Self-pay | Admitting: Physical Medicine and Rehabilitation

## 2023-02-26 DIAGNOSIS — E1342 Other specified diabetes mellitus with diabetic polyneuropathy: Secondary | ICD-10-CM

## 2023-03-05 DIAGNOSIS — F4310 Post-traumatic stress disorder, unspecified: Secondary | ICD-10-CM | POA: Diagnosis not present

## 2023-03-05 DIAGNOSIS — F411 Generalized anxiety disorder: Secondary | ICD-10-CM | POA: Diagnosis not present

## 2023-03-05 DIAGNOSIS — F332 Major depressive disorder, recurrent severe without psychotic features: Secondary | ICD-10-CM | POA: Diagnosis not present

## 2023-03-13 DIAGNOSIS — M5126 Other intervertebral disc displacement, lumbar region: Secondary | ICD-10-CM | POA: Diagnosis not present

## 2023-03-13 DIAGNOSIS — G894 Chronic pain syndrome: Secondary | ICD-10-CM | POA: Diagnosis not present

## 2023-04-10 ENCOUNTER — Encounter: Payer: Medicare PPO | Attending: Physical Medicine and Rehabilitation | Admitting: Physical Medicine and Rehabilitation

## 2023-04-10 VITALS — BP 128/75 | HR 103 | Ht 71.0 in | Wt 225.0 lb

## 2023-04-10 DIAGNOSIS — M545 Low back pain, unspecified: Secondary | ICD-10-CM | POA: Insufficient documentation

## 2023-04-10 NOTE — Progress Notes (Signed)
-  Discussed Qutenza as an option for neuropathic pain control. Discussed that this is a capsaicin patch, stronger than capsaicin cream. Discussed that it is currently approved for diabetic peripheral neuropathy and post-herpetic neuralgia, but that it has also shown benefit in treating other forms of neuropathy. Provided patient with link to site to learn more about the patch: https://www.clark.biz/. Discussed that the patch would be placed in office and benefits usually last 3 months. Discussed that unintended exposure to capsaicin can cause severe irritation of eyes, mucous membranes, respiratory tract, and skin, but that Qutenza is a local treatment and does not have the systemic side effects of other nerve medications. Discussed that there may be pain, itching, erythema, and decreased sensory function associated with the application of Qutenza. Side effects usually subside within 1 week. A cold pack of analgesic medications can help with these side effects. Blood pressure can also be increased due to pain associated with administration of the patch.   1 patches of Qutenza [phone]) was applied to the area of pain. Ice packs were applied during the procedure to ensure patient comfort. Blood pressure was monitored every 15 minutes. The patient tolerated the procedure well. Post-procedure instructions were given and follow-up has been scheduled.  Topical system measures 14cm x20cm (280cm for a total 1120units) were applied which will cause deeper penetration for destruction of the peripheral nerve using a chemical (Qutenza) which infuses into the skin like an injection and heat technique (occlusive, compressive dressing cauing endothermic heat technique)

## 2023-04-12 DIAGNOSIS — M5126 Other intervertebral disc displacement, lumbar region: Secondary | ICD-10-CM | POA: Diagnosis not present

## 2023-04-12 DIAGNOSIS — G894 Chronic pain syndrome: Secondary | ICD-10-CM | POA: Diagnosis not present

## 2023-04-16 NOTE — Progress Notes (Signed)
Cardiology Office Note:  .   Date:  04/17/2023  ID:  [name], DOB 07-25-1940, MRN [id number] PCP: Merri Brunette, MD  [facility] Providers Cardiologist:  Olga Millers, MD {  History of Present Illness: .   [name] is a 82 y.o. male with a past medical history of venous insufficiency with prior venous stasis ulcer, OSA on CPAP, hypertension, GERD, morbid obesity, trifascicular block, hypercholesteremia and CAD status post MI and stent to LAD in 1987 and again in 1997 with repeat stenting to the LAD with a DES in April 2016 presenting for annual follow-up.  No recurrence with angina,.  Patient called to make an appointment back in March and had been feeling down, not exercising, feels withdrawn and states he is sleeping a lot and is worried about coronary artery disease.  Denies any chest pain or dyspnea.  His physical exam was unchanged as well as his EKG.  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 The patient reports that his heart rate is typically in the 90s, but today it is 104, which is the first time he is aware of it being in the hundreds. The patient denies any symptoms of a racing heart. The patient also reports occasional chest discomfort, which he treats preemptively with nitroglycerin spray.  The patient reports using the spray approximately once every six months and keeps it in strategic locations for easy access.  The patient also has a history of mental health issues, including a significant event two years ago that required hospitalization in two different facilities. The patient reports that the second facility, in [place], was particularly helpful. The patient also reports a history of financial difficulties, which have since been resolved with the help of friends and his  church community.  The patient also reports taking mirtazapine (Remeron) for sleep and has stopped taking melatonin. The patient has COPD but reports it does not significantly impact his daily activities. He uses a CPAP machine at night, which helps him sleep. However, the patient reports a desire to sleep all the time and expresses concern about what this might mean.  Reports no shortness of breath nor dyspnea on exertion. Reports no chest pain, pressure, or tightness. No edema, orthopnea, PND. Reports no palpitations.   Discussed the use of AI scribe software for clinical note transcription with the patient, who gave verbal consent to proceed.  ROS: Pertinent ROS in HPI  Studies Reviewed: Marland Kitchen   EKG Interpretation Date/Time:  Friday April 17 2023 08:50:37 EST Ventricular Rate:  91 PR Interval:  222 QRS Duration:  152 QT Interval:  404 QTC Calculation: 496 R Axis:   -52  Text Interpretation: Sinus rhythm with 1st degree A-V block with Premature supraventricular complexes Left axis deviation Right bundle branch block Septal infarct , age undetermined When compared with ECG of 29-Jan-2021 15:20, PREVIOUS ECG IS PRESENT Confirmed by Jari Favre [phone]) on 04/17/2023 9:05:23 AM      Physical Exam:   VS:  BP 122/74   Pulse (!) 104   Ht 5\' 11"  (1.803 m)   Wt 229 lb (103.9 kg)   SpO2 96%   BMI 31.94 kg/m    Wt Readings from Last 3 Encounters:  04/17/23 229 lb (103.9 kg)  04/10/23 225 lb (102.1 kg)  01/12/23 229 lb 6.4 oz (  104.1 kg)    GEN: Well nourished, well developed in no acute distress NECK: No JVD; No carotid bruits CARDIAC: RRR, no murmurs, rubs, gallops RESPIRATORY:  Clear to auscultation without rales, wheezing or rhonchi  ABDOMEN: Soft, non-tender, non-distended EXTREMITIES:  No edema; No deformity   ASSESSMENT AND PLAN: .    Angina Infrequent episodes, managed with sublingual nitroglycerin as needed. No recent acute coronary events. -Continue current management  strategy with nitroglycerin as needed.  Tachycardia Heart rate noted to be 104, higher than usual for the patient. No associated symptoms reported. No recent caffeine intake. Possible anxiety related to new medical setting. -Obtain EKG today due to elevated heart rate. -update echocardiogram due to it being over a year since the last one.  Chronic Obstructive Pulmonary Disease (COPD) Reports no significant symptoms, uses CPAP at night. -Continue current management strategy.  Hypertension Blood pressure well controlled on current regimen of amlodipine. -Refill amlodipine prescription.  Insomnia Managed with mirtazapine (Remeron), no longer using melatonin. -Continue mirtazapine as needed for sleep.  Daytime Sleepiness Reports feeling tired during the day, possible side effect of mirtazapine or related to underlying medical conditions. -Monitor and reassess if symptoms persist or worsen.  Past Mental Health Event Reports a significant mental health event two years ago, managed with support from mental health professionals and social network. -Continue current mental health support strategies.  General Health Maintenance / Followup Plans -Schedule follow-up appointment in one year with either Dr. Jens Som or Dr. Jacinto Halim.      Dispo: He can follow-up in 1 year with Dr. Jacinto Halim  Signed, Sharlene Dory, PA-C

## 2023-04-17 ENCOUNTER — Ambulatory Visit: Payer: Medicare PPO | Attending: Cardiology | Admitting: Physician Assistant

## 2023-04-17 ENCOUNTER — Encounter: Payer: Self-pay | Admitting: Physician Assistant

## 2023-04-17 VITALS — BP 122/74 | HR 104 | Ht 71.0 in | Wt 229.0 lb

## 2023-04-17 DIAGNOSIS — R0609 Other forms of dyspnea: Secondary | ICD-10-CM | POA: Diagnosis not present

## 2023-04-17 DIAGNOSIS — R5383 Other fatigue: Secondary | ICD-10-CM

## 2023-04-17 DIAGNOSIS — I10 Essential (primary) hypertension: Secondary | ICD-10-CM

## 2023-04-17 DIAGNOSIS — E785 Hyperlipidemia, unspecified: Secondary | ICD-10-CM | POA: Diagnosis not present

## 2023-04-17 DIAGNOSIS — I2510 Atherosclerotic heart disease of native coronary artery without angina pectoris: Secondary | ICD-10-CM | POA: Diagnosis not present

## 2023-04-17 DIAGNOSIS — I453 Trifascicular block: Secondary | ICD-10-CM

## 2023-04-17 MED ORDER — KLOR-CON M10 10 MEQ PO TBCR: 10.0000 meq | EXTENDED_RELEASE_TABLET | Freq: Every day | ORAL | 3 refills | Status: DC

## 2023-04-17 MED ORDER — AMLODIPINE BESYLATE 5 MG PO TABS
5.0000 mg | ORAL_TABLET | Freq: Every day | ORAL | 3 refills | Status: DC
Start: 2023-04-17 — End: 2023-06-12

## 2023-04-17 MED ORDER — EZETIMIBE 10 MG PO TABS
10.0000 mg | ORAL_TABLET | Freq: Every evening | ORAL | 3 refills | Status: DC
Start: 2023-04-17 — End: 2023-07-09

## 2023-04-17 NOTE — Patient Instructions (Addendum)
Medication Instructions:  REFILLED Potassium, Zetia, Amlodipine *If you need a refill on your cardiac medications before your next appointment, please call your pharmacy*  Testing/Procedures: ECHO Your physician has requested that you have an echocardiogram. Echocardiography is a painless test that uses sound waves to create images of your heart. It provides your doctor with information about the size and shape of your heart and how well your heart's chambers and valves are working. This procedure takes approximately one hour. There are no restrictions for this procedure. Please do NOT wear cologne, perfume, aftershave, or lotions (deodorant is allowed). Please arrive 15 minutes prior to your appointment time.  Please note: We ask at that you not bring children with you during ultrasound (echo/ vascular) testing. Due to room size and safety concerns, children are not allowed in the ultrasound rooms during exams. Our front office staff cannot provide observation of children in our lobby area while testing is being conducted. An adult accompanying a patient to their appointment will only be allowed in the ultrasound room at the discretion of the ultrasound technician under special circumstances. We apologize for any inconvenience.  Follow-Up: At [facility], you and your health needs are our priority.  As part of our continuing mission to provide you with exceptional heart care, we have created designated Provider Care Teams.  These Care Teams include your primary Cardiologist (physician) and Advanced Practice Providers (APPs -  Physician Assistants and Nurse Practitioners) who all work together to provide you with the care you need, when you need it.  Your next appointment:   1 year(s)  Provider:   Yates Decamp, mD

## 2023-04-30 DIAGNOSIS — M7541 Impingement syndrome of right shoulder: Secondary | ICD-10-CM | POA: Diagnosis not present

## 2023-04-30 DIAGNOSIS — F332 Major depressive disorder, recurrent severe without psychotic features: Secondary | ICD-10-CM | POA: Diagnosis not present

## 2023-04-30 DIAGNOSIS — F4310 Post-traumatic stress disorder, unspecified: Secondary | ICD-10-CM | POA: Diagnosis not present

## 2023-04-30 DIAGNOSIS — Z789 Other specified health status: Secondary | ICD-10-CM | POA: Diagnosis not present

## 2023-04-30 DIAGNOSIS — F411 Generalized anxiety disorder: Secondary | ICD-10-CM | POA: Diagnosis not present

## 2023-04-30 DIAGNOSIS — M159 Polyosteoarthritis, unspecified: Secondary | ICD-10-CM | POA: Diagnosis not present

## 2023-04-30 DIAGNOSIS — I2510 Atherosclerotic heart disease of native coronary artery without angina pectoris: Secondary | ICD-10-CM | POA: Diagnosis not present

## 2023-05-07 ENCOUNTER — Other Ambulatory Visit: Payer: Self-pay | Admitting: Physical Medicine and Rehabilitation

## 2023-05-07 MED ORDER — LIDOCAINE 5 % EX PTCH: 1.0000 | MEDICATED_PATCH | CUTANEOUS | 0 refills | Status: AC

## 2023-05-13 ENCOUNTER — Ambulatory Visit (HOSPITAL_COMMUNITY): Payer: Medicare PPO

## 2023-05-13 DIAGNOSIS — L400 Psoriasis vulgaris: Secondary | ICD-10-CM | POA: Diagnosis not present

## 2023-05-13 DIAGNOSIS — L821 Other seborrheic keratosis: Secondary | ICD-10-CM | POA: Diagnosis not present

## 2023-05-13 DIAGNOSIS — D485 Neoplasm of uncertain behavior of skin: Secondary | ICD-10-CM | POA: Diagnosis not present

## 2023-05-13 DIAGNOSIS — M5126 Other intervertebral disc displacement, lumbar region: Secondary | ICD-10-CM | POA: Diagnosis not present

## 2023-05-13 DIAGNOSIS — G894 Chronic pain syndrome: Secondary | ICD-10-CM | POA: Diagnosis not present

## 2023-05-20 ENCOUNTER — Ambulatory Visit (HOSPITAL_COMMUNITY): Payer: Medicare PPO | Attending: Physician Assistant

## 2023-05-20 DIAGNOSIS — I453 Trifascicular block: Secondary | ICD-10-CM

## 2023-05-20 DIAGNOSIS — I2510 Atherosclerotic heart disease of native coronary artery without angina pectoris: Secondary | ICD-10-CM

## 2023-05-20 DIAGNOSIS — R0609 Other forms of dyspnea: Secondary | ICD-10-CM

## 2023-05-20 DIAGNOSIS — R5383 Other fatigue: Secondary | ICD-10-CM | POA: Diagnosis not present

## 2023-05-20 DIAGNOSIS — E785 Hyperlipidemia, unspecified: Secondary | ICD-10-CM | POA: Diagnosis not present

## 2023-05-20 DIAGNOSIS — I10 Essential (primary) hypertension: Secondary | ICD-10-CM | POA: Diagnosis not present

## 2023-05-20 LAB — ECHOCARDIOGRAM COMPLETE
Area-P 1/2: 4.11 cm2
S' Lateral: 3.9 cm

## 2023-05-22 ENCOUNTER — Other Ambulatory Visit: Payer: Self-pay | Admitting: Physical Medicine and Rehabilitation

## 2023-05-26 ENCOUNTER — Encounter: Payer: Self-pay | Admitting: Physician Assistant

## 2023-06-02 ENCOUNTER — Encounter: Payer: Self-pay | Admitting: Cardiology

## 2023-06-02 NOTE — Progress Notes (Signed)
I reviewed all the messages and also his chart, not sure that his mildly reduced LVEF explains his symptoms, however this can be addressed by optimizing his medical care with addition of beta-blocker therapy and consider medication management.  Can be scheduled to be seen by me or anyone of [place] for management of heart failure with mildly reduced EF.

## 2023-06-03 DIAGNOSIS — F332 Major depressive disorder, recurrent severe without psychotic features: Secondary | ICD-10-CM | POA: Diagnosis not present

## 2023-06-03 DIAGNOSIS — F411 Generalized anxiety disorder: Secondary | ICD-10-CM | POA: Diagnosis not present

## 2023-06-03 DIAGNOSIS — F4310 Post-traumatic stress disorder, unspecified: Secondary | ICD-10-CM | POA: Diagnosis not present

## 2023-06-12 ENCOUNTER — Ambulatory Visit: Payer: Medicare PPO | Attending: Cardiology | Admitting: Cardiology

## 2023-06-12 ENCOUNTER — Encounter: Payer: Self-pay | Admitting: Cardiology

## 2023-06-12 VITALS — BP 108/68 | HR 99 | Resp 16 | Ht 71.0 in | Wt 228.6 lb

## 2023-06-12 DIAGNOSIS — I10 Essential (primary) hypertension: Secondary | ICD-10-CM | POA: Diagnosis not present

## 2023-06-12 DIAGNOSIS — I5022 Chronic systolic (congestive) heart failure: Secondary | ICD-10-CM | POA: Diagnosis not present

## 2023-06-12 DIAGNOSIS — I2510 Atherosclerotic heart disease of native coronary artery without angina pectoris: Secondary | ICD-10-CM | POA: Diagnosis not present

## 2023-06-12 DIAGNOSIS — I453 Trifascicular block: Secondary | ICD-10-CM | POA: Diagnosis not present

## 2023-06-12 MED ORDER — METOPROLOL SUCCINATE ER 25 MG PO TB24: 25.0000 mg | ORAL_TABLET | Freq: Every morning | ORAL | 1 refills | Status: DC

## 2023-06-12 MED ORDER — LOSARTAN POTASSIUM 25 MG PO TABS: 25.0000 mg | ORAL_TABLET | Freq: Every evening | ORAL | 1 refills | Status: DC

## 2023-06-12 NOTE — Patient Instructions (Addendum)
Medication Instructions:  Stop amlodipine Start Metoprolol Succinate 25 mg by mouth daily in the morning. Start Losartan 25 mg by mouth daily in the evening *If you need a refill on your cardiac medications before your next appointment, please call your pharmacy*   Lab Work: Have lab work  (BMP) done in 3 weeks.  This can be done at any [facility].  It is not fasting If you have labs (blood work) drawn today and your tests are completely normal, you will receive your results only by: MyChart Message (if you have MyChart) OR A paper copy in the mail If you have any lab test that is abnormal or we need to change your treatment, we will call you to review the results.   Testing/Procedures:    Follow-Up: At [facility], you and your health needs are our priority.  As part of our continuing mission to provide you with exceptional heart care, we have created designated Provider Care Teams.  These Care Teams include your primary Cardiologist (physician) and Advanced Practice Providers (APPs -  Physician Assistants and Nurse Practitioners) who all work together to provide you with the care you need, when you need it.  We recommend signing up for the patient portal called "MyChart".  Sign up information is provided on this After Visit Summary.  MyChart is used to connect with patients for Virtual Visits (Telemedicine).  Patients are able to view lab/test results, encounter notes, upcoming appointments, etc.  Non-urgent messages can be sent to your provider as well.   To learn more about what you can do with MyChart, go to [email or web address].    Your next appointment:   5/16  Provider:   Yates Decamp, MD     Other Instructions

## 2023-06-12 NOTE — Progress Notes (Signed)
Cardiology Office Note:  .   Date:  06/14/2023  ID:  Jearld Lesch, DOB [birth year], MRN [id number] PCP: Merri Brunette, MD  [facility] Providers Cardiologist:  Yates Decamp, MD   History of Present Illness: .   [name] is a 83 y.o. Caucasian male with past medical history of venous insufficiency with prior venous stasis ulcer, OSA on CPAP, hypertension, GERD, morbid obesity, trifascicular block, hypercholesterolemia and CAD S/P MI and stent to LAD in 1987 and again in 1997, repeat stenting to LAD stenosis with DES in April 2016.  Discussed the use of AI scribe software for clinical note transcription with the patient, who gave verbal consent to proceed.  History of Present Illness   The patient, with a history of heart disease, old heart attack, bypass, and stents, presents with concerns about his heart function, which he recently learned is at 45%. He is worried about what this means for his health, but the doctor reassures him that his heart function has always been at the lower limit of normal due to his history of heart disease and that a function of 45% is not alarming. The patient also reports feeling sleepy all the time and having less energy, which has affected his social activities and involvement in his church. He has not noticed any changes in his weight.     Labs   Lab Results  Component Value Date   CHOL 130 07/03/2022   HDL 35 (L) 07/03/2022   LDLCALC 69 07/03/2022   LDLDIRECT 76 07/03/2022   TRIG 147 07/03/2022   CHOLHDL 3.0 11/27/2014   Lab Results  Component Value Date   NA 139 03/04/2022   K 4.5 03/04/2022   CO2 27 03/04/2022   GLUCOSE 103 (H) 03/04/2022   BUN 12 03/04/2022   CREATININE 1.00 03/04/2022   CALCIUM 9.6 03/04/2022   GFRNONAA >60 03/04/2022      Latest Ref Rng & Units 03/04/2022    5:41 PM 02/19/2021    3:39 PM 01/29/2021    3:04 AM  BMP  Glucose 70 - 99 mg/dL 409  811  914   BUN 8 - 23 mg/dL 12  9  9    Creatinine 0.61 - 1.24  mg/dL 7.82  9.56  2.13   Sodium 135 - 145 mmol/L 139  139  139   Potassium 3.5 - 5.1 mmol/L 4.5  3.9  3.4   Chloride 98 - 111 mmol/L 103  106  103   CO2 22 - 32 mmol/L 27  25  27    Calcium 8.9 - 10.3 mg/dL 9.6  9.2  9.3       Latest Ref Rng & Units 03/04/2022    5:41 PM 02/19/2021    3:39 PM 01/29/2021    3:04 AM  CBC  WBC 4.0 - 10.5 K/uL 8.1  4.6  4.0   Hemoglobin 13.0 - 17.0 g/dL 08.6  57.8  46.9   Hematocrit 39.0 - 52.0 % 47.0  44.2  43.5   Platelets 150 - 400 K/uL 210  162  190    No results found for: "HGBA1C"  Lab Results  Component Value Date   TSH 8.79 (H) 01/13/2022   Review of Systems  Cardiovascular:  Negative for chest pain, dyspnea on exertion and leg swelling.   Physical Exam:   VS:  BP 108/68 (BP Location: Right Arm, Patient Position: Sitting, Cuff Size: Large)   Pulse 99   Resp 16   Ht 5\' 11"  (  1.803 m)   Wt 228 lb 9.6 oz (103.7 kg)   BMI 31.88 kg/m    Wt Readings from Last 3 Encounters:  06/12/23 228 lb 9.6 oz (103.7 kg)  04/17/23 229 lb (103.9 kg)  04/10/23 225 lb (102.1 kg)    Physical Exam Neck:     Vascular: No JVD.  Cardiovascular:     Rate and Rhythm: Normal rate and regular rhythm.     Pulses: Intact distal pulses.     Heart sounds: S1 normal and S2 normal. Murmur heard.     Systolic murmur is present.     Early diastolic murmur is present with a grade of 2/4 at the upper left sternal border.     No gallop.  Pulmonary:     Effort: Pulmonary effort is normal.     Breath sounds: Normal breath sounds.  Abdominal:     General: Bowel sounds are normal.     Palpations: Abdomen is soft.  Musculoskeletal:     Right lower leg: Edema (1-2+ pitting) present.     Left lower leg: Edema (1-2+ pitting) present.    Studies Reviewed: Marland Kitchen    ECHOCARDIOGRAM COMPLETE 05/20/2023 1. Left ventricular ejection fraction, by estimation, is 40 to 45%. The left ventricle has mildly decreased function. The left ventricle demonstrates global hypokinesis. Left  ventricular diastolic parameters are consistent with Grade I diastolic dysfunction (impaired relaxation). 2. Right ventricular systolic function is normal. The right ventricular size is normal. 3. No evidence of mitral valve regurgitation. 4. The aortic valve is tricuspid. Aortic valve regurgitation is mild. 5. The inferior vena cava IVC not well visualized. Compared to 07/08/2020, LVEF is slightly decreased from 50%.  EKG:    EKG 07/03/2022: Sinus rhythm with first-degree AV block at rate of 94 bpm, left axis deviation, left anterior fascicular block.  Right bundle branch block.  Trifascicular block.   Medications and allergies    Allergies  Allergen Reactions   Slo-Niacin [Niacin Er (Antihyperlipidemic)] Other (See Comments)    Pt felt like his body was burning on the inside.     Current Outpatient Medications:    acetaminophen (TYLENOL) 325 MG tablet, Take 650 mg by mouth every 6 (six) hours as needed for mild pain or headache., Disp: , Rfl:    albuterol (PROVENTIL HFA;VENTOLIN HFA) 108 (90 BASE) MCG/ACT inhaler, Inhale 1-2 puffs into the lungs every 6 (six) hours as needed for wheezing or shortness of breath., Disp: 1 Inhaler, Rfl: 11   albuterol (PROVENTIL) (2.5 MG/3ML) 0.083% nebulizer solution, SMARTSIG:3 Milliliter(s) Via Nebulizer Every 6 Hours PRN, Disp: , Rfl:    aspirin EC 81 MG tablet, Take 81 mg by mouth daily., Disp: , Rfl:    atorvastatin (LIPITOR) 40 MG tablet, every evening., Disp: , Rfl:    capsaicin topical system (QUTENZA, 4 PATCH,) 8 %, MD TO APPLY 4 PATCHES TOPICALLY FOR 1 DOSE EVERY 90 DAYS, Disp: 4 patch, Rfl: 3   Cyanocobalamin (VITAMIN B-12) 5000 MCG TBDP, Take 5,000 mcg by mouth daily., Disp: , Rfl:    cycloSPORINE (RESTASIS) 0.05 % ophthalmic emulsion, , Disp: , Rfl:    docusate sodium (COLACE) 100 MG capsule, Take 100 mg by mouth every other day., Disp: , Rfl:    ezetimibe (ZETIA) 10 MG tablet, Take 1 tablet (10 mg total) by mouth every evening., Disp: 90  tablet, Rfl: 3   fexofenadine (ALLEGRA) 180 MG tablet, Take 180 mg by mouth daily., Disp: , Rfl:    fluticasone (CUTIVATE) 0.05 %  cream, Apply 1 application topically 2 (two) times daily., Disp: , Rfl: 2   hydrOXYzine (ATARAX) 25 MG tablet, Take 1 tablet by mouth 2 (two) times daily as needed., Disp: , Rfl:    ketoconazole (NIZORAL) 2 % cream, APPLY TOPICALLY TO GROIN RASH ONCE DAILY, Disp: , Rfl:    KLOR-CON M10 10 MEQ tablet, Take 1 tablet (10 mEq total) by mouth daily., Disp: 90 tablet, Rfl: 3   levothyroxine (SYNTHROID) 125 MCG tablet, Take 125 mcg by mouth daily before breakfast., Disp: , Rfl:    lidocaine (LIDODERM) 5 %, Place 1 patch onto the skin daily. Remove & Discard patch within 12 hours or as directed by MD, Disp: 5 patch, Rfl: 0   losartan (COZAAR) 25 MG tablet, Take 1 tablet (25 mg total) by mouth every evening., Disp: 90 tablet, Rfl: 1   Lutein-Zeaxanthin 25-5 MG CAPS, Take 1 capsule by mouth daily., Disp: , Rfl:    metoprolol succinate (TOPROL XL) 25 MG 24 hr tablet, Take 1 tablet (25 mg total) by mouth every morning., Disp: 90 tablet, Rfl: 1   mirtazapine (REMERON) 30 MG tablet, Take 30 mg by mouth at bedtime., Disp: , Rfl:    Multiple Vitamin (MULTIVITAMIN) capsule, Take 1 capsule by mouth daily., Disp: , Rfl:    naproxen (NAPROSYN) 500 MG tablet, Take 1 tablet (500 mg total) by mouth 2 (two) times daily., Disp: 30 tablet, Rfl: 0   nitroGLYCERIN (NITROLINGUAL) 0.4 MG/SPRAY spray, Place 1 spray under the tongue every 5 (five) minutes x 3 doses as needed for chest pain. Use as directed as needed, Disp: 12 g, Rfl: 12   NON FORMULARY, CPAP machine every night, Disp: , Rfl:    Omega-3 1000 MG CAPS, Take 1 capsule by mouth daily., Disp: , Rfl:    pantoprazole (PROTONIX) 40 MG tablet, Take 30-60 min before first meal of the day (Patient taking differently: Take 40 mg by mouth daily before breakfast.), Disp: , Rfl:    Semaglutide,0.25 or 0.5MG /DOS, (OZEMPIC, 0.25 OR 0.5 MG/DOSE,) 2  MG/1.5ML SOPN, , Disp: , Rfl:    senna (SENOKOT) 8.6 MG tablet, TAKE 1 TAB AT BEDTIME FOR CONSTIPATION, Disp: , Rfl:    sildenafil (VIAGRA) 100 MG tablet, Take 100 mg by mouth daily as needed for erectile dysfunction., Disp: , Rfl:    Thiamine HCl (THIAMINE PO), TAKE 1 TAB DAILY FOR REPLETION, Disp: , Rfl:    traMADol (ULTRAM) 50 MG tablet, Take 1 tablet 3 times a day by oral route as needed for pain for 5 days., Disp: , Rfl:    Vitamin D, Ergocalciferol, (DRISDOL) 1.25 MG (50000 UNIT) CAPS capsule, TAKE 1 CAPSULE (50,000 UNITS TOTAL) BY MOUTH EVERY 7 (SEVEN) DAYS, Disp: 7 capsule, Rfl: 0   zinc sulfate 220 (50 Zn) MG capsule, Take 220 mg by mouth daily., Disp: , Rfl:    ASSESSMENT AND PLAN: .      ICD-10-CM   1. Coronary artery disease involving native coronary artery of native heart without angina pectoris  I25.10     2. Trifascicular block  I45.3     3. Primary hypertension  I10 Basic Metabolic Panel (BMET)    4. Chronic heart failure with mildly reduced ejection fraction (HFmrEF, 41-49%) (HCC)  I50.22 Basic Metabolic Panel (BMET)     Patient's LVEF is mildly reduced compared to previous, this could be related to progression of coronary artery disease however he is completely asymptomatic without chest pain or dyspnea and there is no clinical  evidence of heart failure.  Hence I would like to optimize his medical therapy with discontinuing amlodipine and switching him over to metoprolol succinate 25 mg in the morning and losartan 25 mg in the evening and obtain a BMP in 2 to 3 weeks.  I would like to see him back in 3 months for follow-up of heart failure with mildly reduced LVEF and CAD and hypertension. Assessment and Plan    Heart Failure with Mildly Reduced Ejection Fraction (HFrEF) Ejection fraction decreased from 50% to 45%, which remains within acceptable limits. No symptoms such as chest pain or dyspnea are present, likely due to CAD progression. The goal is to optimize medical  therapy to improve heart function. Discontinue amlodipine. Start metoprolol succinate 25 mg once daily in the morning and losartan 25 mg once daily in the evening. Obtain BMP in 2-3 weeks. Follow up in 3 months.  Coronary Artery Disease (CAD) Heart function remains consistent at 45-50% with no new evidence of disease worsening. History of MI and bypass surgery noted. Continue current management and medications. Monitor for new symptoms or changes.  Hypertension Adjusting medication regimen to better control blood pressure and support heart function. Benefits of metoprolol and losartan explained. Discontinue amlodipine. Start metoprolol succinate 25 mg once daily in the morning and losartan 25 mg once daily in the evening.  General Health Maintenance Encouraged to maintain a comprehensive list of all medications, including OTC drugs, for accurate management and to avoid interactions. Bring this list to each appointment.  Follow-up Follow up in 3 months. Obtain BMP in 2-3 weeks.           Signed,  Yates Decamp, MD, [facility] 06/14/2023, 7:14 PM [facility] [address] [place], [place] [address] Phone: [phone]. Fax:  [phone]

## 2023-06-13 DIAGNOSIS — G894 Chronic pain syndrome: Secondary | ICD-10-CM | POA: Diagnosis not present

## 2023-06-13 DIAGNOSIS — M5126 Other intervertebral disc displacement, lumbar region: Secondary | ICD-10-CM | POA: Diagnosis not present

## 2023-07-09 ENCOUNTER — Encounter: Payer: Self-pay | Admitting: Physical Medicine and Rehabilitation

## 2023-07-09 ENCOUNTER — Encounter: Payer: Medicare PPO | Attending: Physical Medicine and Rehabilitation | Admitting: Physical Medicine and Rehabilitation

## 2023-07-09 VITALS — BP 119/79 | HR 92 | Ht 71.0 in | Wt 228.4 lb

## 2023-07-09 DIAGNOSIS — M47816 Spondylosis without myelopathy or radiculopathy, lumbar region: Secondary | ICD-10-CM | POA: Insufficient documentation

## 2023-07-09 MED ORDER — CAPSAICIN-CLEANSING GEL 8 % EX KIT
1.0000 | PACK | Freq: Once | CUTANEOUS | Status: AC
Start: 2023-07-09 — End: 2023-07-09
  Administered 2023-07-09: 1 via TOPICAL

## 2023-07-09 NOTE — Progress Notes (Signed)
-  Discussed Qutenza as an option for neuropathic pain control. Discussed that this is a capsaicin patch, stronger than capsaicin cream. Discussed that it is currently approved for diabetic peripheral neuropathy and post-herpetic neuralgia, but that it has also shown benefit in treating other forms of neuropathy. Provided patient with link to site to learn more about the patch: https://www.clark.biz/. Discussed that the patch would be placed in office and benefits usually last 3 months. Discussed that unintended exposure to capsaicin can cause severe irritation of eyes, mucous membranes, respiratory tract, and skin, but that Qutenza is a local treatment and does not have the systemic side effects of other nerve medications. Discussed that there may be pain, itching, erythema, and decreased sensory function associated with the application of Qutenza. Side effects usually subside within 1 week. A cold pack of analgesic medications can help with these side effects. Blood pressure can also be increased due to pain associated with administration of the patch.   1 patch of Qutenza [phone]) was applied to the bilateral low back. Ice packs were applied during the procedure to ensure patient comfort. Blood pressure was monitored every 15 minutes. The patient tolerated the procedure well. Post-procedure instructions were given and follow-up has been scheduled.  Topical system measures 14cm x20cm (280cm for a total 1120units) were applied which will cause deeper penetration for destruction of the peripheral nerve using a chemical (Qutenza) which infuses into the skin like an injection and heat technique (occlusive, compressive dressing cauing endothermic heat technique)

## 2023-07-13 DIAGNOSIS — M5126 Other intervertebral disc displacement, lumbar region: Secondary | ICD-10-CM | POA: Diagnosis not present

## 2023-07-13 DIAGNOSIS — G894 Chronic pain syndrome: Secondary | ICD-10-CM | POA: Diagnosis not present

## 2023-07-29 DIAGNOSIS — E039 Hypothyroidism, unspecified: Secondary | ICD-10-CM | POA: Diagnosis not present

## 2023-07-29 DIAGNOSIS — R7303 Prediabetes: Secondary | ICD-10-CM | POA: Diagnosis not present

## 2023-07-29 DIAGNOSIS — I10 Essential (primary) hypertension: Secondary | ICD-10-CM | POA: Diagnosis not present

## 2023-08-03 DIAGNOSIS — Z0000 Encounter for general adult medical examination without abnormal findings: Secondary | ICD-10-CM | POA: Diagnosis not present

## 2023-08-03 DIAGNOSIS — I2510 Atherosclerotic heart disease of native coronary artery without angina pectoris: Secondary | ICD-10-CM | POA: Diagnosis not present

## 2023-08-03 DIAGNOSIS — J449 Chronic obstructive pulmonary disease, unspecified: Secondary | ICD-10-CM | POA: Diagnosis not present

## 2023-08-03 DIAGNOSIS — E039 Hypothyroidism, unspecified: Secondary | ICD-10-CM | POA: Diagnosis not present

## 2023-08-03 DIAGNOSIS — L409 Psoriasis, unspecified: Secondary | ICD-10-CM | POA: Diagnosis not present

## 2023-08-10 DIAGNOSIS — M5416 Radiculopathy, lumbar region: Secondary | ICD-10-CM | POA: Diagnosis not present

## 2023-08-13 DIAGNOSIS — G894 Chronic pain syndrome: Secondary | ICD-10-CM | POA: Diagnosis not present

## 2023-08-13 DIAGNOSIS — M5126 Other intervertebral disc displacement, lumbar region: Secondary | ICD-10-CM | POA: Diagnosis not present

## 2023-08-21 ENCOUNTER — Other Ambulatory Visit: Payer: Self-pay | Admitting: Physical Medicine and Rehabilitation

## 2023-08-21 MED ORDER — JOURNAVX 50 MG PO TABS
1.0000 | ORAL_TABLET | Freq: Two times a day (BID) | ORAL | 0 refills | Status: DC | PRN
Start: 2023-08-21 — End: 2023-11-05

## 2023-08-27 DIAGNOSIS — M5416 Radiculopathy, lumbar region: Secondary | ICD-10-CM | POA: Diagnosis not present

## 2023-09-11 ENCOUNTER — Ambulatory Visit: Payer: Medicare PPO | Admitting: Cardiology

## 2023-09-12 DIAGNOSIS — M5126 Other intervertebral disc displacement, lumbar region: Secondary | ICD-10-CM | POA: Diagnosis not present

## 2023-09-12 DIAGNOSIS — G894 Chronic pain syndrome: Secondary | ICD-10-CM | POA: Diagnosis not present

## 2023-09-14 DIAGNOSIS — I2510 Atherosclerotic heart disease of native coronary artery without angina pectoris: Secondary | ICD-10-CM | POA: Diagnosis not present

## 2023-10-09 ENCOUNTER — Ambulatory Visit: Admitting: Physical Medicine and Rehabilitation

## 2023-10-13 DIAGNOSIS — R0781 Pleurodynia: Secondary | ICD-10-CM | POA: Diagnosis not present

## 2023-10-13 DIAGNOSIS — M25551 Pain in right hip: Secondary | ICD-10-CM | POA: Diagnosis not present

## 2023-10-13 DIAGNOSIS — G894 Chronic pain syndrome: Secondary | ICD-10-CM | POA: Diagnosis not present

## 2023-10-13 DIAGNOSIS — M5126 Other intervertebral disc displacement, lumbar region: Secondary | ICD-10-CM | POA: Diagnosis not present

## 2023-10-17 DIAGNOSIS — M549 Dorsalgia, unspecified: Secondary | ICD-10-CM | POA: Diagnosis not present

## 2023-10-17 DIAGNOSIS — J4489 Other specified chronic obstructive pulmonary disease: Secondary | ICD-10-CM | POA: Diagnosis not present

## 2023-10-17 DIAGNOSIS — R0781 Pleurodynia: Secondary | ICD-10-CM | POA: Diagnosis not present

## 2023-10-17 DIAGNOSIS — I5022 Chronic systolic (congestive) heart failure: Secondary | ICD-10-CM | POA: Diagnosis not present

## 2023-10-17 DIAGNOSIS — M25511 Pain in right shoulder: Secondary | ICD-10-CM | POA: Diagnosis not present

## 2023-10-17 DIAGNOSIS — M47812 Spondylosis without myelopathy or radiculopathy, cervical region: Secondary | ICD-10-CM | POA: Diagnosis not present

## 2023-10-17 DIAGNOSIS — G8911 Acute pain due to trauma: Secondary | ICD-10-CM | POA: Diagnosis not present

## 2023-10-17 DIAGNOSIS — I110 Hypertensive heart disease with heart failure: Secondary | ICD-10-CM | POA: Diagnosis not present

## 2023-10-17 DIAGNOSIS — I2510 Atherosclerotic heart disease of native coronary artery without angina pectoris: Secondary | ICD-10-CM | POA: Diagnosis not present

## 2023-10-20 DIAGNOSIS — G8911 Acute pain due to trauma: Secondary | ICD-10-CM | POA: Diagnosis not present

## 2023-10-20 DIAGNOSIS — I2510 Atherosclerotic heart disease of native coronary artery without angina pectoris: Secondary | ICD-10-CM | POA: Diagnosis not present

## 2023-10-20 DIAGNOSIS — M47812 Spondylosis without myelopathy or radiculopathy, cervical region: Secondary | ICD-10-CM | POA: Diagnosis not present

## 2023-10-20 DIAGNOSIS — J4489 Other specified chronic obstructive pulmonary disease: Secondary | ICD-10-CM | POA: Diagnosis not present

## 2023-10-20 DIAGNOSIS — M549 Dorsalgia, unspecified: Secondary | ICD-10-CM | POA: Diagnosis not present

## 2023-10-20 DIAGNOSIS — I110 Hypertensive heart disease with heart failure: Secondary | ICD-10-CM | POA: Diagnosis not present

## 2023-10-20 DIAGNOSIS — R0781 Pleurodynia: Secondary | ICD-10-CM | POA: Diagnosis not present

## 2023-10-20 DIAGNOSIS — M25511 Pain in right shoulder: Secondary | ICD-10-CM | POA: Diagnosis not present

## 2023-10-20 DIAGNOSIS — I5022 Chronic systolic (congestive) heart failure: Secondary | ICD-10-CM | POA: Diagnosis not present

## 2023-10-29 DIAGNOSIS — R0781 Pleurodynia: Secondary | ICD-10-CM | POA: Diagnosis not present

## 2023-10-29 DIAGNOSIS — I5022 Chronic systolic (congestive) heart failure: Secondary | ICD-10-CM | POA: Diagnosis not present

## 2023-10-29 DIAGNOSIS — M549 Dorsalgia, unspecified: Secondary | ICD-10-CM | POA: Diagnosis not present

## 2023-10-29 DIAGNOSIS — I110 Hypertensive heart disease with heart failure: Secondary | ICD-10-CM | POA: Diagnosis not present

## 2023-10-29 DIAGNOSIS — M47812 Spondylosis without myelopathy or radiculopathy, cervical region: Secondary | ICD-10-CM | POA: Diagnosis not present

## 2023-10-29 DIAGNOSIS — G8911 Acute pain due to trauma: Secondary | ICD-10-CM | POA: Diagnosis not present

## 2023-10-29 DIAGNOSIS — I2510 Atherosclerotic heart disease of native coronary artery without angina pectoris: Secondary | ICD-10-CM | POA: Diagnosis not present

## 2023-10-29 DIAGNOSIS — M25511 Pain in right shoulder: Secondary | ICD-10-CM | POA: Diagnosis not present

## 2023-10-29 DIAGNOSIS — J4489 Other specified chronic obstructive pulmonary disease: Secondary | ICD-10-CM | POA: Diagnosis not present

## 2023-11-03 DIAGNOSIS — G8311 Monoplegia of lower limb affecting right dominant side: Secondary | ICD-10-CM | POA: Diagnosis not present

## 2023-11-03 DIAGNOSIS — M25511 Pain in right shoulder: Secondary | ICD-10-CM | POA: Diagnosis not present

## 2023-11-04 DIAGNOSIS — I5022 Chronic systolic (congestive) heart failure: Secondary | ICD-10-CM | POA: Diagnosis not present

## 2023-11-04 DIAGNOSIS — M549 Dorsalgia, unspecified: Secondary | ICD-10-CM | POA: Diagnosis not present

## 2023-11-04 DIAGNOSIS — G8911 Acute pain due to trauma: Secondary | ICD-10-CM | POA: Diagnosis not present

## 2023-11-04 DIAGNOSIS — I2510 Atherosclerotic heart disease of native coronary artery without angina pectoris: Secondary | ICD-10-CM | POA: Diagnosis not present

## 2023-11-04 DIAGNOSIS — M47812 Spondylosis without myelopathy or radiculopathy, cervical region: Secondary | ICD-10-CM | POA: Diagnosis not present

## 2023-11-04 DIAGNOSIS — R0781 Pleurodynia: Secondary | ICD-10-CM | POA: Diagnosis not present

## 2023-11-04 DIAGNOSIS — J4489 Other specified chronic obstructive pulmonary disease: Secondary | ICD-10-CM | POA: Diagnosis not present

## 2023-11-04 DIAGNOSIS — I110 Hypertensive heart disease with heart failure: Secondary | ICD-10-CM | POA: Diagnosis not present

## 2023-11-04 DIAGNOSIS — M25511 Pain in right shoulder: Secondary | ICD-10-CM | POA: Diagnosis not present

## 2023-11-05 ENCOUNTER — Encounter: Payer: Self-pay | Admitting: Cardiology

## 2023-11-05 ENCOUNTER — Ambulatory Visit: Attending: Cardiology | Admitting: Cardiology

## 2023-11-05 VITALS — BP 114/74 | HR 98 | Resp 16 | Ht 71.0 in | Wt 227.4 lb

## 2023-11-05 DIAGNOSIS — I2510 Atherosclerotic heart disease of native coronary artery without angina pectoris: Secondary | ICD-10-CM

## 2023-11-05 DIAGNOSIS — I10 Essential (primary) hypertension: Secondary | ICD-10-CM

## 2023-11-05 DIAGNOSIS — I5022 Chronic systolic (congestive) heart failure: Secondary | ICD-10-CM

## 2023-11-05 DIAGNOSIS — I453 Trifascicular block: Secondary | ICD-10-CM | POA: Diagnosis not present

## 2023-11-05 MED ORDER — LOSARTAN POTASSIUM 25 MG PO TABS: 25.0000 mg | ORAL_TABLET | Freq: Every evening | ORAL | 3 refills | Status: AC

## 2023-11-05 NOTE — Progress Notes (Addendum)
 Cardiology Office Note:  .   Date:  11/05/2023  ID:  [name], DOB April 08, 1941, MRN [id number] PCP: Clarice Nottingham, MD  [facility] Providers Cardiologist:  Gordy Bergamo, MD   History of Present Illness: .   [name] is a 83 y.o. Caucasian male with past medical history of venous insufficiency with prior venous stasis ulcer, OSA on CPAP, hypertension, GERD, morbid obesity, trifascicular block, hypercholesterolemia and CAD S/P MI and stent to LAD in 1987 and again in 1997, repeat stenting to LAD stenosis with DES in April 2016.  On his last office visit I discontinued amlodipine  and started him on low-dose of beta-blocker therapy in spite of underlying trifascicular block and also started him on losartan  to achieve GDP for chronic diastolic heart failure.  He is tolerating the medications well, denies any dyspnea or leg edema.  Denies chest pain.  Discussed the use of AI scribe software for clinical note transcription with the patient, who gave verbal consent to proceed.  History of Present Illness [name] is an 83 year old male with heart failure and hypertension who presents for routine follow-up.  He takes losartan  daily, which effectively manages his heart failure and blood pressure. He experiences sleepiness when sitting for extended periods, but staying active helps him remain alert. He is on atorvastatin  40 mg and ezetimibe  10 mg daily for cholesterol management and has an adequate supply of these medications. He has experienced a recent weight loss of one to two pounds, which he attributes to lower-calorie meals and increased activity.  Labs   Lab Results  Component Value Date   CHOL 130 07/03/2022   HDL 35 (L) 07/03/2022   LDLCALC 69 07/03/2022   LDLDIRECT 76 07/03/2022   TRIG 147 07/03/2022   CHOLHDL 3.0 11/27/2014   External Labs:  PCP labs on Care Everywhere 09/15/2023:  Total cholesterol 143, triglycerides 136, HDL 40, LDL 79.  Labs 07/30/2023:  Hb  16.3/HCT 48.4, platelets 197.  Serum glucose 96 mg, BUN 13, creatinine 0.87, EGFR 85 mL, potassium 4.3, LFTs normal.  ROS  Review of Systems  Cardiovascular:  Negative for chest pain, dyspnea on exertion and leg swelling.   Physical Exam:   VS:  There were no vitals taken for this visit.   Wt Readings from Last 3 Encounters:  07/09/23 228 lb 6.4 oz (103.6 kg)  06/12/23 228 lb 9.6 oz (103.7 kg)  04/17/23 229 lb (103.9 kg)    Physical Exam Neck:     Vascular: No carotid bruit or JVD.  Cardiovascular:     Rate and Rhythm: Normal rate and regular rhythm.     Pulses: Intact distal pulses.     Heart sounds: Normal heart sounds. No murmur heard.    No gallop.  Pulmonary:     Effort: Pulmonary effort is normal.     Breath sounds: Normal breath sounds.  Abdominal:     General: Bowel sounds are normal.     Palpations: Abdomen is soft.  Musculoskeletal:     Right lower leg: No edema.     Left lower leg: No edema.    Studies Reviewed: SABRA     EKG:         Medications ordered    No orders of the defined types were placed in this encounter.    ASSESSMENT AND PLAN: .      ICD-10-CM   1. Chronic heart failure with mildly reduced ejection fraction (HFmrEF, 41-49%) (HCC)  I50.22  2. Coronary artery disease involving native coronary artery of native heart without angina pectoris  I25.10     3. Essential hypertension  I10     4. Trifascicular block  I45.3      Assessment & Plan Heart failure with reduced ejection fraction Mildly reduced heart function, well-managed on losartan . Kidney function and potassium levels are within normal limits.  He is also tolerating metoprolol  succinate 25 mg daily in spite of him having trifascicular block on the EKG.  He remains asymptomatic. - Continue losartan  as prescribed - Monitor kidney function and potassium levels regularly  Hypertension Blood pressure is well-controlled with amlodipine , losartan  and metoprolol  succinate 25 mg  daily. - Continue losartan  as prescribed  Hyperlipidemia Managed with atorvastatin  40 mg and ezetimibe  10 mg daily. No issues with medication adherence. - Continue atorvastatin  40 mg daily - Continue ezetimibe  10 mg daily  He is presently asymptomatic and is tolerating all his medications well, external labs from PCP reviewed, is presently 83 years of age, continue present management I will see him back in a year or sooner if problems.  Signed,  Gordy Bergamo, MD, [facility] 11/05/2023, 6:48 AM [facility] [address] [name], [place] [address] Phone: [phone]. Fax:  [phone]

## 2023-11-05 NOTE — Patient Instructions (Signed)
 Medication Instructions:  Your physician recommends that you continue on your current medications as directed. Please refer to the Current Medication list given to you today.  *If you need a refill on your cardiac medications before your next appointment, please call your pharmacy*  Lab Work: none If you have labs (blood work) drawn today and your tests are completely normal, you will receive your results only by: MyChart Message (if you have MyChart) OR A paper copy in the mail If you have any lab test that is abnormal or we need to change your treatment, we will call you to review the results.  Testing/Procedures: none  Follow-Up: At [facility], you and your health needs are our priority.  As part of our continuing mission to provide you with exceptional heart care, our providers are all part of one team.  This team includes your primary Cardiologist (physician) and Advanced Practice Providers or APPs (Physician Assistants and Nurse Practitioners) who all work together to provide you with the care you need, when you need it.  Your next appointment:   12 month(s)  Provider:   Knox Perl, MD    We recommend signing up for the patient portal called "MyChart".  Sign up information is provided on this After Visit Summary.  MyChart is used to connect with patients for Virtual Visits (Telemedicine).  Patients are able to view lab/test results, encounter notes, upcoming appointments, etc.  Non-urgent messages can be sent to your provider as well.   To learn more about what you can do with MyChart, go to [email or web address].   Other Instructions

## 2023-11-12 DIAGNOSIS — G894 Chronic pain syndrome: Secondary | ICD-10-CM | POA: Diagnosis not present

## 2023-11-12 DIAGNOSIS — M5126 Other intervertebral disc displacement, lumbar region: Secondary | ICD-10-CM | POA: Diagnosis not present

## 2023-11-13 DIAGNOSIS — I5022 Chronic systolic (congestive) heart failure: Secondary | ICD-10-CM | POA: Diagnosis not present

## 2023-11-13 DIAGNOSIS — I110 Hypertensive heart disease with heart failure: Secondary | ICD-10-CM | POA: Diagnosis not present

## 2023-11-13 DIAGNOSIS — M549 Dorsalgia, unspecified: Secondary | ICD-10-CM | POA: Diagnosis not present

## 2023-11-13 DIAGNOSIS — G8911 Acute pain due to trauma: Secondary | ICD-10-CM | POA: Diagnosis not present

## 2023-11-13 DIAGNOSIS — I2510 Atherosclerotic heart disease of native coronary artery without angina pectoris: Secondary | ICD-10-CM | POA: Diagnosis not present

## 2023-11-13 DIAGNOSIS — M47812 Spondylosis without myelopathy or radiculopathy, cervical region: Secondary | ICD-10-CM | POA: Diagnosis not present

## 2023-11-13 DIAGNOSIS — J4489 Other specified chronic obstructive pulmonary disease: Secondary | ICD-10-CM | POA: Diagnosis not present

## 2023-11-13 DIAGNOSIS — M25511 Pain in right shoulder: Secondary | ICD-10-CM | POA: Diagnosis not present

## 2023-11-13 DIAGNOSIS — R0781 Pleurodynia: Secondary | ICD-10-CM | POA: Diagnosis not present

## 2023-11-18 DIAGNOSIS — M25511 Pain in right shoulder: Secondary | ICD-10-CM | POA: Diagnosis not present

## 2023-11-18 DIAGNOSIS — M47812 Spondylosis without myelopathy or radiculopathy, cervical region: Secondary | ICD-10-CM | POA: Diagnosis not present

## 2023-11-18 DIAGNOSIS — J4489 Other specified chronic obstructive pulmonary disease: Secondary | ICD-10-CM | POA: Diagnosis not present

## 2023-11-18 DIAGNOSIS — G8911 Acute pain due to trauma: Secondary | ICD-10-CM | POA: Diagnosis not present

## 2023-11-18 DIAGNOSIS — I2510 Atherosclerotic heart disease of native coronary artery without angina pectoris: Secondary | ICD-10-CM | POA: Diagnosis not present

## 2023-11-18 DIAGNOSIS — R0781 Pleurodynia: Secondary | ICD-10-CM | POA: Diagnosis not present

## 2023-11-18 DIAGNOSIS — I5022 Chronic systolic (congestive) heart failure: Secondary | ICD-10-CM | POA: Diagnosis not present

## 2023-11-18 DIAGNOSIS — M549 Dorsalgia, unspecified: Secondary | ICD-10-CM | POA: Diagnosis not present

## 2023-11-18 DIAGNOSIS — I110 Hypertensive heart disease with heart failure: Secondary | ICD-10-CM | POA: Diagnosis not present

## 2023-11-23 ENCOUNTER — Encounter: Attending: Physical Medicine and Rehabilitation | Admitting: Physical Medicine and Rehabilitation

## 2023-11-23 ENCOUNTER — Encounter: Payer: Self-pay | Admitting: Physical Medicine and Rehabilitation

## 2023-11-23 VITALS — BP 122/80 | HR 85 | Ht 71.0 in | Wt 235.0 lb

## 2023-11-23 DIAGNOSIS — L989 Disorder of the skin and subcutaneous tissue, unspecified: Secondary | ICD-10-CM | POA: Diagnosis not present

## 2023-11-23 DIAGNOSIS — G6289 Other specified polyneuropathies: Secondary | ICD-10-CM | POA: Insufficient documentation

## 2023-11-23 DIAGNOSIS — M47816 Spondylosis without myelopathy or radiculopathy, lumbar region: Secondary | ICD-10-CM | POA: Insufficient documentation

## 2023-11-23 MED ORDER — CAPSAICIN-CLEANSING GEL 8 % EX KIT
1.0000 | PACK | Freq: Once | CUTANEOUS | Status: AC
  Administered 2023-11-23: 1 via TOPICAL

## 2023-11-23 NOTE — Progress Notes (Addendum)
-  Discussed Qutenza  as an option for neuropathic pain control. Discussed that this is a capsaicin  patch, stronger than capsaicin  cream. Discussed that it is currently approved for diabetic peripheral neuropathy and post-herpetic neuralgia, but that it has also shown benefit in treating other forms of neuropathy. Provided patient with link to site to learn more about the patch: https://www.qutenza .com/. Discussed that the patch would be placed in office and benefits usually last 3 months. Discussed that unintended exposure to capsaicin  can cause severe irritation of eyes, mucous membranes, respiratory tract, and skin, but that Qutenza  is a local treatment and does not have the systemic side effects of other nerve medications. Discussed that there may be pain, itching, erythema, and decreased sensory function associated with the application of Qutenza . Side effects usually subside within 1 week. A cold pack of analgesic medications can help with these side effects. Blood pressure can also be increased due to pain associated with administration of the patch.   1 patches of Qutenza  [phone]) was applied to bilateral lumbar spine. Ice packs were applied during the procedure to ensure patient comfort. Blood pressure was monitored every 15 minutes. The patient tolerated the procedure well. Post-procedure instructions were given and follow-up has been scheduled.  Topical system measures 14cm x20cm (280cm for a total 1120units) were applied which will cause deeper penetration for destruction of the peripheral nerve using a chemical (Qutenza ) which infuses into the skin like an injection and heat technique (occlusive, compressive dressing cauing endothermic heat technique)   Back ROM heat and cold orthosis ordered to facilitate healing and reduce pain by restricting the mobility of the trunk

## 2023-11-23 NOTE — Patient Instructions (Signed)
 Frankincense in coconut oil  Foods that may reduce pain: 1) Ginger (especially studied for arthritis)- reduce leukotriene production to decrease inflammation 2) Blueberries- high in phytonutrients that decrease inflammation 3) Salmon- marine omega-3s reduce joint swelling and pain 4) Pumpkin seeds- reduce inflammation 5) dark chocolate- reduces inflammation 6) turmeric- reduces inflammation 7) tart cherries - reduce pain and stiffness 8) extra virgin olive oil - its compound olecanthal helps to block prostaglandins  9) chili peppers- can be eaten or applied topically via capsaicin  10) mint- helpful for headache, muscle aches, joint pain, and itching 11) garlic- reduces inflammation 12) Green tea- reduces inflammation and oxidative stress, helps with weight loss, may reduce the risk of cancer, recommend Double Green Matcha [place] of Tea daily  Link to further information on diet for chronic pain: http://www.bray.com/

## 2023-11-23 NOTE — Addendum Note (Signed)
 Addended by: Nissim Fleischer M on: 11/23/2023 01:29 PM   Modules accepted: Orders

## 2023-11-26 DIAGNOSIS — R0781 Pleurodynia: Secondary | ICD-10-CM | POA: Diagnosis not present

## 2023-11-26 DIAGNOSIS — M25511 Pain in right shoulder: Secondary | ICD-10-CM | POA: Diagnosis not present

## 2023-11-26 DIAGNOSIS — J4489 Other specified chronic obstructive pulmonary disease: Secondary | ICD-10-CM | POA: Diagnosis not present

## 2023-11-26 DIAGNOSIS — M549 Dorsalgia, unspecified: Secondary | ICD-10-CM | POA: Diagnosis not present

## 2023-11-26 DIAGNOSIS — I5022 Chronic systolic (congestive) heart failure: Secondary | ICD-10-CM | POA: Diagnosis not present

## 2023-11-26 DIAGNOSIS — G8911 Acute pain due to trauma: Secondary | ICD-10-CM | POA: Diagnosis not present

## 2023-11-26 DIAGNOSIS — I2510 Atherosclerotic heart disease of native coronary artery without angina pectoris: Secondary | ICD-10-CM | POA: Diagnosis not present

## 2023-11-26 DIAGNOSIS — I110 Hypertensive heart disease with heart failure: Secondary | ICD-10-CM | POA: Diagnosis not present

## 2023-11-26 DIAGNOSIS — M47812 Spondylosis without myelopathy or radiculopathy, cervical region: Secondary | ICD-10-CM | POA: Diagnosis not present

## 2023-11-30 DIAGNOSIS — R748 Abnormal levels of other serum enzymes: Secondary | ICD-10-CM | POA: Diagnosis not present

## 2023-11-30 DIAGNOSIS — K921 Melena: Secondary | ICD-10-CM | POA: Diagnosis not present

## 2023-12-03 DIAGNOSIS — I2510 Atherosclerotic heart disease of native coronary artery without angina pectoris: Secondary | ICD-10-CM | POA: Diagnosis not present

## 2023-12-03 DIAGNOSIS — G8911 Acute pain due to trauma: Secondary | ICD-10-CM | POA: Diagnosis not present

## 2023-12-03 DIAGNOSIS — M549 Dorsalgia, unspecified: Secondary | ICD-10-CM | POA: Diagnosis not present

## 2023-12-03 DIAGNOSIS — I110 Hypertensive heart disease with heart failure: Secondary | ICD-10-CM | POA: Diagnosis not present

## 2023-12-03 DIAGNOSIS — I5022 Chronic systolic (congestive) heart failure: Secondary | ICD-10-CM | POA: Diagnosis not present

## 2023-12-03 DIAGNOSIS — R0781 Pleurodynia: Secondary | ICD-10-CM | POA: Diagnosis not present

## 2023-12-03 DIAGNOSIS — M47812 Spondylosis without myelopathy or radiculopathy, cervical region: Secondary | ICD-10-CM | POA: Diagnosis not present

## 2023-12-03 DIAGNOSIS — J4489 Other specified chronic obstructive pulmonary disease: Secondary | ICD-10-CM | POA: Diagnosis not present

## 2023-12-03 DIAGNOSIS — M25511 Pain in right shoulder: Secondary | ICD-10-CM | POA: Diagnosis not present

## 2023-12-07 ENCOUNTER — Other Ambulatory Visit: Payer: Self-pay | Admitting: Cardiology

## 2023-12-11 DIAGNOSIS — I110 Hypertensive heart disease with heart failure: Secondary | ICD-10-CM | POA: Diagnosis not present

## 2023-12-11 DIAGNOSIS — I5022 Chronic systolic (congestive) heart failure: Secondary | ICD-10-CM | POA: Diagnosis not present

## 2023-12-11 DIAGNOSIS — R0781 Pleurodynia: Secondary | ICD-10-CM | POA: Diagnosis not present

## 2023-12-11 DIAGNOSIS — M47812 Spondylosis without myelopathy or radiculopathy, cervical region: Secondary | ICD-10-CM | POA: Diagnosis not present

## 2023-12-11 DIAGNOSIS — I2510 Atherosclerotic heart disease of native coronary artery without angina pectoris: Secondary | ICD-10-CM | POA: Diagnosis not present

## 2023-12-11 DIAGNOSIS — M549 Dorsalgia, unspecified: Secondary | ICD-10-CM | POA: Diagnosis not present

## 2023-12-11 DIAGNOSIS — G8911 Acute pain due to trauma: Secondary | ICD-10-CM | POA: Diagnosis not present

## 2023-12-11 DIAGNOSIS — J4489 Other specified chronic obstructive pulmonary disease: Secondary | ICD-10-CM | POA: Diagnosis not present

## 2023-12-11 DIAGNOSIS — M25511 Pain in right shoulder: Secondary | ICD-10-CM | POA: Diagnosis not present

## 2023-12-13 DIAGNOSIS — G894 Chronic pain syndrome: Secondary | ICD-10-CM | POA: Diagnosis not present

## 2023-12-13 DIAGNOSIS — M5126 Other intervertebral disc displacement, lumbar region: Secondary | ICD-10-CM | POA: Diagnosis not present

## 2023-12-15 DIAGNOSIS — I5022 Chronic systolic (congestive) heart failure: Secondary | ICD-10-CM | POA: Diagnosis not present

## 2023-12-15 DIAGNOSIS — G8911 Acute pain due to trauma: Secondary | ICD-10-CM | POA: Diagnosis not present

## 2023-12-15 DIAGNOSIS — J4489 Other specified chronic obstructive pulmonary disease: Secondary | ICD-10-CM | POA: Diagnosis not present

## 2023-12-15 DIAGNOSIS — R0781 Pleurodynia: Secondary | ICD-10-CM | POA: Diagnosis not present

## 2023-12-15 DIAGNOSIS — M25511 Pain in right shoulder: Secondary | ICD-10-CM | POA: Diagnosis not present

## 2023-12-15 DIAGNOSIS — I110 Hypertensive heart disease with heart failure: Secondary | ICD-10-CM | POA: Diagnosis not present

## 2023-12-15 DIAGNOSIS — M549 Dorsalgia, unspecified: Secondary | ICD-10-CM | POA: Diagnosis not present

## 2023-12-15 DIAGNOSIS — I2510 Atherosclerotic heart disease of native coronary artery without angina pectoris: Secondary | ICD-10-CM | POA: Diagnosis not present

## 2023-12-15 DIAGNOSIS — M47812 Spondylosis without myelopathy or radiculopathy, cervical region: Secondary | ICD-10-CM | POA: Diagnosis not present

## 2023-12-21 DIAGNOSIS — G8911 Acute pain due to trauma: Secondary | ICD-10-CM | POA: Diagnosis not present

## 2023-12-21 DIAGNOSIS — I110 Hypertensive heart disease with heart failure: Secondary | ICD-10-CM | POA: Diagnosis not present

## 2023-12-21 DIAGNOSIS — M549 Dorsalgia, unspecified: Secondary | ICD-10-CM | POA: Diagnosis not present

## 2023-12-21 DIAGNOSIS — M25511 Pain in right shoulder: Secondary | ICD-10-CM | POA: Diagnosis not present

## 2023-12-25 DIAGNOSIS — M25511 Pain in right shoulder: Secondary | ICD-10-CM | POA: Diagnosis not present

## 2023-12-25 DIAGNOSIS — I2510 Atherosclerotic heart disease of native coronary artery without angina pectoris: Secondary | ICD-10-CM | POA: Diagnosis not present

## 2023-12-25 DIAGNOSIS — M47812 Spondylosis without myelopathy or radiculopathy, cervical region: Secondary | ICD-10-CM | POA: Diagnosis not present

## 2023-12-25 DIAGNOSIS — G8911 Acute pain due to trauma: Secondary | ICD-10-CM | POA: Diagnosis not present

## 2023-12-25 DIAGNOSIS — I110 Hypertensive heart disease with heart failure: Secondary | ICD-10-CM | POA: Diagnosis not present

## 2023-12-25 DIAGNOSIS — J4489 Other specified chronic obstructive pulmonary disease: Secondary | ICD-10-CM | POA: Diagnosis not present

## 2023-12-25 DIAGNOSIS — I5022 Chronic systolic (congestive) heart failure: Secondary | ICD-10-CM | POA: Diagnosis not present

## 2023-12-25 DIAGNOSIS — M5030 Other cervical disc degeneration, unspecified cervical region: Secondary | ICD-10-CM | POA: Diagnosis not present

## 2023-12-25 DIAGNOSIS — M549 Dorsalgia, unspecified: Secondary | ICD-10-CM | POA: Diagnosis not present

## 2023-12-31 DIAGNOSIS — M47812 Spondylosis without myelopathy or radiculopathy, cervical region: Secondary | ICD-10-CM | POA: Diagnosis not present

## 2023-12-31 DIAGNOSIS — M25511 Pain in right shoulder: Secondary | ICD-10-CM | POA: Diagnosis not present

## 2023-12-31 DIAGNOSIS — I5022 Chronic systolic (congestive) heart failure: Secondary | ICD-10-CM | POA: Diagnosis not present

## 2023-12-31 DIAGNOSIS — G8911 Acute pain due to trauma: Secondary | ICD-10-CM | POA: Diagnosis not present

## 2023-12-31 DIAGNOSIS — J4489 Other specified chronic obstructive pulmonary disease: Secondary | ICD-10-CM | POA: Diagnosis not present

## 2023-12-31 DIAGNOSIS — M549 Dorsalgia, unspecified: Secondary | ICD-10-CM | POA: Diagnosis not present

## 2023-12-31 DIAGNOSIS — M5030 Other cervical disc degeneration, unspecified cervical region: Secondary | ICD-10-CM | POA: Diagnosis not present

## 2023-12-31 DIAGNOSIS — I2510 Atherosclerotic heart disease of native coronary artery without angina pectoris: Secondary | ICD-10-CM | POA: Diagnosis not present

## 2023-12-31 DIAGNOSIS — I110 Hypertensive heart disease with heart failure: Secondary | ICD-10-CM | POA: Diagnosis not present

## 2024-01-06 DIAGNOSIS — E119 Type 2 diabetes mellitus without complications: Secondary | ICD-10-CM | POA: Diagnosis not present

## 2024-01-08 DIAGNOSIS — I2510 Atherosclerotic heart disease of native coronary artery without angina pectoris: Secondary | ICD-10-CM | POA: Diagnosis not present

## 2024-01-08 DIAGNOSIS — M5030 Other cervical disc degeneration, unspecified cervical region: Secondary | ICD-10-CM | POA: Diagnosis not present

## 2024-01-08 DIAGNOSIS — I5022 Chronic systolic (congestive) heart failure: Secondary | ICD-10-CM | POA: Diagnosis not present

## 2024-01-08 DIAGNOSIS — J4489 Other specified chronic obstructive pulmonary disease: Secondary | ICD-10-CM | POA: Diagnosis not present

## 2024-01-08 DIAGNOSIS — I110 Hypertensive heart disease with heart failure: Secondary | ICD-10-CM | POA: Diagnosis not present

## 2024-01-08 DIAGNOSIS — G8911 Acute pain due to trauma: Secondary | ICD-10-CM | POA: Diagnosis not present

## 2024-01-08 DIAGNOSIS — M549 Dorsalgia, unspecified: Secondary | ICD-10-CM | POA: Diagnosis not present

## 2024-01-08 DIAGNOSIS — M25511 Pain in right shoulder: Secondary | ICD-10-CM | POA: Diagnosis not present

## 2024-01-08 DIAGNOSIS — M47812 Spondylosis without myelopathy or radiculopathy, cervical region: Secondary | ICD-10-CM | POA: Diagnosis not present

## 2024-01-11 ENCOUNTER — Ambulatory Visit: Admitting: Dermatology

## 2024-01-12 DIAGNOSIS — G8911 Acute pain due to trauma: Secondary | ICD-10-CM | POA: Diagnosis not present

## 2024-01-12 DIAGNOSIS — M25511 Pain in right shoulder: Secondary | ICD-10-CM | POA: Diagnosis not present

## 2024-01-12 DIAGNOSIS — M5030 Other cervical disc degeneration, unspecified cervical region: Secondary | ICD-10-CM | POA: Diagnosis not present

## 2024-01-12 DIAGNOSIS — I5022 Chronic systolic (congestive) heart failure: Secondary | ICD-10-CM | POA: Diagnosis not present

## 2024-01-12 DIAGNOSIS — I2510 Atherosclerotic heart disease of native coronary artery without angina pectoris: Secondary | ICD-10-CM | POA: Diagnosis not present

## 2024-01-12 DIAGNOSIS — M549 Dorsalgia, unspecified: Secondary | ICD-10-CM | POA: Diagnosis not present

## 2024-01-12 DIAGNOSIS — J4489 Other specified chronic obstructive pulmonary disease: Secondary | ICD-10-CM | POA: Diagnosis not present

## 2024-01-12 DIAGNOSIS — M47812 Spondylosis without myelopathy or radiculopathy, cervical region: Secondary | ICD-10-CM | POA: Diagnosis not present

## 2024-01-12 DIAGNOSIS — I110 Hypertensive heart disease with heart failure: Secondary | ICD-10-CM | POA: Diagnosis not present

## 2024-01-13 DIAGNOSIS — M5126 Other intervertebral disc displacement, lumbar region: Secondary | ICD-10-CM | POA: Diagnosis not present

## 2024-01-13 DIAGNOSIS — G894 Chronic pain syndrome: Secondary | ICD-10-CM | POA: Diagnosis not present

## 2024-01-28 DIAGNOSIS — M5030 Other cervical disc degeneration, unspecified cervical region: Secondary | ICD-10-CM | POA: Diagnosis not present

## 2024-01-28 DIAGNOSIS — M25511 Pain in right shoulder: Secondary | ICD-10-CM | POA: Diagnosis not present

## 2024-01-28 DIAGNOSIS — G8911 Acute pain due to trauma: Secondary | ICD-10-CM | POA: Diagnosis not present

## 2024-01-28 DIAGNOSIS — I5022 Chronic systolic (congestive) heart failure: Secondary | ICD-10-CM | POA: Diagnosis not present

## 2024-01-28 DIAGNOSIS — M549 Dorsalgia, unspecified: Secondary | ICD-10-CM | POA: Diagnosis not present

## 2024-01-28 DIAGNOSIS — M47812 Spondylosis without myelopathy or radiculopathy, cervical region: Secondary | ICD-10-CM | POA: Diagnosis not present

## 2024-01-28 DIAGNOSIS — J4489 Other specified chronic obstructive pulmonary disease: Secondary | ICD-10-CM | POA: Diagnosis not present

## 2024-01-28 DIAGNOSIS — I2510 Atherosclerotic heart disease of native coronary artery without angina pectoris: Secondary | ICD-10-CM | POA: Diagnosis not present

## 2024-01-28 DIAGNOSIS — I110 Hypertensive heart disease with heart failure: Secondary | ICD-10-CM | POA: Diagnosis not present

## 2024-02-04 ENCOUNTER — Ambulatory Visit: Admitting: Podiatry

## 2024-02-04 DIAGNOSIS — M79676 Pain in unspecified toe(s): Secondary | ICD-10-CM

## 2024-02-04 DIAGNOSIS — B351 Tinea unguium: Secondary | ICD-10-CM

## 2024-02-05 NOTE — Progress Notes (Signed)
 Subjective:  Patient ID: [name], male    DOB: 1940/12/25,  MRN: [id number] HPI Chief Complaint  Patient presents with   Nail Problem    Bilateral hallux toenails - right is cracked, left is thick and elongated    83 y.o. male presents with the above complaint.   ROS: Denies fever chills nausea vomit muscle aches pains calf pain back pain chest pain shortness of breath.  Past Medical History:  Diagnosis Date   Allergic asthma    Barrett's esophagus    BPH (benign prostatic hyperplasia)    CAD (coronary artery disease)    CAD (coronary atherosclerotic disease)    Confusion    COPD (chronic obstructive pulmonary disease) (HCC)    Diabetes (HCC)    GERD (gastroesophageal reflux disease)    Heart attack (HCC)    x2   Hiatal hernia    Hyperlipidemia    Hypertension    Hypothyroidism    Irregular heart rhythm    Polypharmacy    Primary hypertension 08/14/2014   Psoriasis    Sleep apnea    wears CPAP nightly   Past Surgical History:  Procedure Laterality Date   CATARACT EXTRACTION Left 09/2021   COLONOSCOPY  03/2017   CORONARY ANGIOPLASTY WITH STENT PLACEMENT  08/15/2014   PCI of pLAD Xience alpine DES   EYE SURGERY  01/2017   eyelids lifted   LEFT HEART CATHETERIZATION WITH CORONARY ANGIOGRAM N/A 08/15/2014   Procedure: LEFT HEART CATHETERIZATION WITH CORONARY ANGIOGRAM;  Surgeon: Peter M Swaziland, MD;  Location: [facility] CATH LAB;  Service: Cardiovascular;  Laterality: N/A;   left knee arthroscopy Left 1994   NASAL POLYP EXCISION  1953   NASAL SINUS SURGERY     deviated septum   TONSILLECTOMY     TOTAL HIP ARTHROPLASTY Right 06/22/2017   Procedure: RIGHT TOTAL HIP ARTHROPLASTY ANTERIOR APPROACH;  Surgeon: Fidel Rogue, MD;  Location: [facility] OR;  Service: Orthopedics;  Laterality: Right;  Needs RNFA   TOTAL HIP REVISION Right 07/10/2017   Procedure: RIGHT TOTAL HIP REVISION FEMORAL COMPONENT;  Surgeon: Fidel Rogue, MD;  Location: [facility] OR;  Service: Orthopedics;   Laterality: Right;  Needs RNFA    Current Outpatient Medications:    acetaminophen  (TYLENOL ) 325 MG tablet, Take 650 mg by mouth every 6 (six) hours as needed for mild pain or headache., Disp: , Rfl:    albuterol  (PROVENTIL  HFA;VENTOLIN  HFA) 108 (90 BASE) MCG/ACT inhaler, Inhale 1-2 puffs into the lungs every 6 (six) hours as needed for wheezing or shortness of breath., Disp: 1 Inhaler, Rfl: 11   albuterol  (PROVENTIL ) (2.5 MG/3ML) 0.083% nebulizer solution, Take 2.5 mg by nebulization every 6 (six) hours as needed for wheezing., Disp: , Rfl:    amLODipine  (NORVASC ) 5 MG tablet, Take 5 mg by mouth daily., Disp: , Rfl:    amoxicillin  (AMOXIL ) 500 MG capsule, Take 500 mg by mouth 3 (three) times daily., Disp: , Rfl:    ARIPiprazole (ABILIFY) 5 MG tablet, Take 5 mg by mouth daily., Disp: , Rfl:    aspirin  EC 81 MG tablet, Take 81 mg by mouth daily., Disp: , Rfl:    atorvastatin  (LIPITOR) 40 MG tablet, Take 40 mg by mouth at bedtime., Disp: , Rfl:    betamethasone  dipropionate 0.05 % cream, Apply 1 Application topically daily., Disp: , Rfl:    capsaicin  topical system (QUTENZA , 4 PATCH,) 8 %, MD TO APPLY 4 PATCHES TOPICALLY FOR 1 DOSE EVERY 90 DAYS (Patient taking differently: Apply 1 patch  topically daily.), Disp: 4 patch, Rfl: 3   ezetimibe  (ZETIA ) 10 MG tablet, Take 10 mg by mouth daily., Disp: , Rfl:    ketoconazole (NIZORAL) 2 % cream, APPLY TOPICALLY TO GROIN RASH ONCE DAILY, Disp: , Rfl:    KLOR-CON  M10 10 MEQ tablet, Take 1 tablet (10 mEq total) by mouth daily., Disp: 90 tablet, Rfl: 3   levothyroxine  (SYNTHROID ) 125 MCG tablet, Take 125 mcg by mouth daily before breakfast., Disp: , Rfl:    lidocaine  (LIDODERM ) 5 %, Place 1 patch onto the skin daily. Remove & Discard patch within 12 hours or as directed by MD, Disp: 5 patch, Rfl: 0   losartan  (COZAAR ) 25 MG tablet, Take 1 tablet (25 mg total) by mouth every evening., Disp: 90 tablet, Rfl: 3   metoprolol  succinate (TOPROL -XL) 25 MG 24 hr  tablet, TAKE 1 TABLET BY MOUTH EVERY DAY IN THE MORNING, Disp: 90 tablet, Rfl: 3   mirtazapine (REMERON) 30 MG tablet, Take 30 mg by mouth at bedtime., Disp: , Rfl:    Multiple Vitamin (MULTIVITAMIN) capsule, Take 1 capsule by mouth daily., Disp: , Rfl:    naproxen  (NAPROSYN ) 500 MG tablet, Take 1 tablet (500 mg total) by mouth 2 (two) times daily., Disp: 30 tablet, Rfl: 0   nitroGLYCERIN  (NITROLINGUAL ) 0.4 MG/SPRAY spray, Place 1 spray under the tongue every 5 (five) minutes x 3 doses as needed for chest pain. Use as directed as needed, Disp: 12 g, Rfl: 12   NON FORMULARY, CPAP machine every night, Disp: , Rfl:    Omega-3 1000 MG CAPS, Take 1 capsule by mouth daily., Disp: , Rfl:    pantoprazole  (PROTONIX ) 40 MG tablet, Take 30-60 min before first meal of the day (Patient taking differently: Take 40 mg by mouth daily before breakfast.), Disp: , Rfl:    senna (SENOKOT) 8.6 MG tablet, TAKE 1 TAB AT BEDTIME FOR CONSTIPATION, Disp: , Rfl:    sildenafil (VIAGRA) 100 MG tablet, Take 100 mg by mouth daily as needed for erectile dysfunction., Disp: , Rfl:    traMADol (ULTRAM) 50 MG tablet, Take 1 tablet 3 times a day by oral route as needed for pain for 5 days., Disp: , Rfl:   Allergies  Allergen Reactions   Slo-Niacin [Niacin Er (Antihyperlipidemic)] Other (See Comments)    Pt felt like his body was burning on the inside.   Review of Systems Objective:  There were no vitals filed for this visit.  General: Well developed, nourished, in no acute distress, alert and oriented x3   Dermatological: Skin is warm, dry and supple bilateral. remaining integument appears unremarkable at this time. There are no open sores, no preulcerative lesions, no rash or signs of infection present.  Toenails are long thick yellow dystrophic clinically mycotic.  Vascular: Dorsalis Pedis artery and Posterior Tibial artery pedal pulses are 2/4 bilateral with immedate capillary fill time. Pedal hair growth present. No  varicosities and no lower extremity edema present bilateral.   Neruologic: Grossly intact via light touch bilateral. Vibratory intact via tuning fork bilateral. Protective threshold with Semmes Wienstein monofilament intact to all pedal sites bilateral. Patellar and Achilles deep tendon reflexes 2+ bilateral. No Babinski or clonus noted bilateral.   Musculoskeletal: No gross boney pedal deformities bilateral. No pain, crepitus, or limitation noted with foot and ankle range of motion bilateral. Muscular strength 5/5 in all groups tested bilateral.  Gait: Unassisted, Nonantalgic.    Radiographs:  None taken  Assessment & Plan:   Assessment: Pain  in limb secondary to onychomycosis  Plan: Debridement of toenails 1 through 5 bilateral.     Marisa Hufstetler T. Dwight, [place]

## 2024-02-08 DIAGNOSIS — Z23 Encounter for immunization: Secondary | ICD-10-CM | POA: Diagnosis not present

## 2024-02-10 DIAGNOSIS — J4489 Other specified chronic obstructive pulmonary disease: Secondary | ICD-10-CM | POA: Diagnosis not present

## 2024-02-10 DIAGNOSIS — M5030 Other cervical disc degeneration, unspecified cervical region: Secondary | ICD-10-CM | POA: Diagnosis not present

## 2024-02-10 DIAGNOSIS — I5022 Chronic systolic (congestive) heart failure: Secondary | ICD-10-CM | POA: Diagnosis not present

## 2024-02-10 DIAGNOSIS — M47812 Spondylosis without myelopathy or radiculopathy, cervical region: Secondary | ICD-10-CM | POA: Diagnosis not present

## 2024-02-10 DIAGNOSIS — I110 Hypertensive heart disease with heart failure: Secondary | ICD-10-CM | POA: Diagnosis not present

## 2024-02-10 DIAGNOSIS — I2510 Atherosclerotic heart disease of native coronary artery without angina pectoris: Secondary | ICD-10-CM | POA: Diagnosis not present

## 2024-02-10 DIAGNOSIS — M25511 Pain in right shoulder: Secondary | ICD-10-CM | POA: Diagnosis not present

## 2024-02-10 DIAGNOSIS — G8911 Acute pain due to trauma: Secondary | ICD-10-CM | POA: Diagnosis not present

## 2024-02-12 DIAGNOSIS — G894 Chronic pain syndrome: Secondary | ICD-10-CM | POA: Diagnosis not present

## 2024-02-12 DIAGNOSIS — M5126 Other intervertebral disc displacement, lumbar region: Secondary | ICD-10-CM | POA: Diagnosis not present

## 2024-02-22 DIAGNOSIS — G8911 Acute pain due to trauma: Secondary | ICD-10-CM | POA: Diagnosis not present

## 2024-02-22 DIAGNOSIS — M549 Dorsalgia, unspecified: Secondary | ICD-10-CM | POA: Diagnosis not present

## 2024-02-22 DIAGNOSIS — I110 Hypertensive heart disease with heart failure: Secondary | ICD-10-CM | POA: Diagnosis not present

## 2024-02-22 DIAGNOSIS — M25511 Pain in right shoulder: Secondary | ICD-10-CM | POA: Diagnosis not present

## 2024-03-04 DIAGNOSIS — I509 Heart failure, unspecified: Secondary | ICD-10-CM | POA: Diagnosis not present

## 2024-03-04 DIAGNOSIS — E039 Hypothyroidism, unspecified: Secondary | ICD-10-CM | POA: Diagnosis not present

## 2024-03-04 DIAGNOSIS — I2510 Atherosclerotic heart disease of native coronary artery without angina pectoris: Secondary | ICD-10-CM | POA: Diagnosis not present

## 2024-03-04 DIAGNOSIS — G319 Degenerative disease of nervous system, unspecified: Secondary | ICD-10-CM | POA: Diagnosis not present

## 2024-03-04 DIAGNOSIS — E785 Hyperlipidemia, unspecified: Secondary | ICD-10-CM | POA: Diagnosis not present

## 2024-03-04 DIAGNOSIS — F325 Major depressive disorder, single episode, in full remission: Secondary | ICD-10-CM | POA: Diagnosis not present

## 2024-03-04 DIAGNOSIS — K219 Gastro-esophageal reflux disease without esophagitis: Secondary | ICD-10-CM | POA: Diagnosis not present

## 2024-03-04 DIAGNOSIS — J439 Emphysema, unspecified: Secondary | ICD-10-CM | POA: Diagnosis not present

## 2024-03-04 DIAGNOSIS — I130 Hypertensive heart and chronic kidney disease with heart failure and stage 1 through stage 4 chronic kidney disease, or unspecified chronic kidney disease: Secondary | ICD-10-CM | POA: Diagnosis not present

## 2024-03-28 DIAGNOSIS — R1013 Epigastric pain: Secondary | ICD-10-CM | POA: Diagnosis not present

## 2024-03-28 DIAGNOSIS — E118 Type 2 diabetes mellitus with unspecified complications: Secondary | ICD-10-CM | POA: Diagnosis not present

## 2024-03-31 ENCOUNTER — Other Ambulatory Visit: Payer: Self-pay | Admitting: Gastroenterology

## 2024-03-31 DIAGNOSIS — K5901 Slow transit constipation: Secondary | ICD-10-CM

## 2024-03-31 DIAGNOSIS — R634 Abnormal weight loss: Secondary | ICD-10-CM

## 2024-03-31 DIAGNOSIS — R109 Unspecified abdominal pain: Secondary | ICD-10-CM | POA: Diagnosis not present

## 2024-04-01 ENCOUNTER — Other Ambulatory Visit: Payer: Self-pay | Admitting: Physician Assistant

## 2024-04-01 DIAGNOSIS — R5383 Other fatigue: Secondary | ICD-10-CM

## 2024-04-01 DIAGNOSIS — I10 Essential (primary) hypertension: Secondary | ICD-10-CM

## 2024-04-01 DIAGNOSIS — I2510 Atherosclerotic heart disease of native coronary artery without angina pectoris: Secondary | ICD-10-CM

## 2024-04-01 DIAGNOSIS — E785 Hyperlipidemia, unspecified: Secondary | ICD-10-CM

## 2024-04-01 DIAGNOSIS — I453 Trifascicular block: Secondary | ICD-10-CM

## 2024-04-01 DIAGNOSIS — R0609 Other forms of dyspnea: Secondary | ICD-10-CM

## 2024-04-04 ENCOUNTER — Inpatient Hospital Stay: Admission: RE | Admit: 2024-04-04 | Discharge: 2024-04-04 | Attending: Gastroenterology | Admitting: Gastroenterology

## 2024-04-04 DIAGNOSIS — K5901 Slow transit constipation: Secondary | ICD-10-CM

## 2024-04-04 DIAGNOSIS — R109 Unspecified abdominal pain: Secondary | ICD-10-CM

## 2024-04-04 DIAGNOSIS — R634 Abnormal weight loss: Secondary | ICD-10-CM

## 2024-05-05 ENCOUNTER — Encounter: Attending: Physical Medicine and Rehabilitation | Admitting: Physical Medicine and Rehabilitation

## 2024-05-05 DIAGNOSIS — M545 Low back pain, unspecified: Secondary | ICD-10-CM

## 2024-05-05 DIAGNOSIS — M47816 Spondylosis without myelopathy or radiculopathy, lumbar region: Secondary | ICD-10-CM

## 2024-05-05 DIAGNOSIS — M5126 Other intervertebral disc displacement, lumbar region: Secondary | ICD-10-CM

## 2024-05-12 NOTE — Progress Notes (Signed)
 This encounter was created in error - please disregard.

## 2024-06-09 ENCOUNTER — Ambulatory Visit: Admitting: Podiatry
